# Patient Record
Sex: Male | Born: 1950 | Race: White | Hispanic: No | Smoking: Current every day smoker
Health system: Southern US, Community
[De-identification: ages and names within clinical notes are randomized; demographics above are authoritative.]

## PROBLEM LIST (undated history)

## (undated) DIAGNOSIS — C649 Malignant neoplasm of unspecified kidney, except renal pelvis: Secondary | ICD-10-CM

## (undated) DIAGNOSIS — J101 Influenza due to other identified influenza virus with other respiratory manifestations: Secondary | ICD-10-CM

## (undated) DIAGNOSIS — F29 Unspecified psychosis not due to a substance or known physiological condition: Secondary | ICD-10-CM

## (undated) DIAGNOSIS — E559 Vitamin D deficiency, unspecified: Secondary | ICD-10-CM

## (undated) DIAGNOSIS — K5730 Diverticulosis of large intestine without perforation or abscess without bleeding: Secondary | ICD-10-CM

## (undated) DIAGNOSIS — F419 Anxiety disorder, unspecified: Secondary | ICD-10-CM

## (undated) DIAGNOSIS — E669 Obesity, unspecified: Secondary | ICD-10-CM

## (undated) DIAGNOSIS — F32A Depression, unspecified: Secondary | ICD-10-CM

## (undated) DIAGNOSIS — F329 Major depressive disorder, single episode, unspecified: Secondary | ICD-10-CM

## (undated) DIAGNOSIS — I10 Essential (primary) hypertension: Secondary | ICD-10-CM

## (undated) DIAGNOSIS — E785 Hyperlipidemia, unspecified: Secondary | ICD-10-CM

## (undated) DIAGNOSIS — B1920 Unspecified viral hepatitis C without hepatic coma: Secondary | ICD-10-CM

## (undated) DIAGNOSIS — R918 Other nonspecific abnormal finding of lung field: Secondary | ICD-10-CM

## (undated) DIAGNOSIS — M109 Gout, unspecified: Secondary | ICD-10-CM

## (undated) DIAGNOSIS — J189 Pneumonia, unspecified organism: Secondary | ICD-10-CM

## (undated) HISTORY — DX: Hyperlipidemia, unspecified: E78.5

## (undated) HISTORY — DX: Vitamin D deficiency, unspecified: E55.9

## (undated) HISTORY — DX: Depression, unspecified: F32.A

## (undated) HISTORY — PX: MANDIBLE FRACTURE SURGERY: SHX706

## (undated) HISTORY — DX: Unspecified viral hepatitis C without hepatic coma: B19.20

## (undated) HISTORY — DX: Essential (primary) hypertension: I10

## (undated) HISTORY — DX: Anxiety disorder, unspecified: F41.9

## (undated) HISTORY — DX: Gout, unspecified: M10.9

## (undated) HISTORY — DX: Major depressive disorder, single episode, unspecified: F32.9

---

## 1998-06-03 DIAGNOSIS — F419 Anxiety disorder, unspecified: Secondary | ICD-10-CM

## 1998-06-03 DIAGNOSIS — F29 Unspecified psychosis not due to a substance or known physiological condition: Secondary | ICD-10-CM

## 1998-06-03 DIAGNOSIS — F32A Depression, unspecified: Secondary | ICD-10-CM

## 1998-06-03 HISTORY — DX: Unspecified psychosis not due to a substance or known physiological condition: F29

## 1998-06-03 HISTORY — DX: Anxiety disorder, unspecified: F41.9

## 1998-06-03 HISTORY — DX: Depression, unspecified: F32.A

## 1999-02-16 ENCOUNTER — Encounter (INDEPENDENT_AMBULATORY_CARE_PROVIDER_SITE_OTHER): Payer: Self-pay | Admitting: Specialist

## 1999-02-16 ENCOUNTER — Ambulatory Visit (HOSPITAL_COMMUNITY): Admission: RE | Admit: 1999-02-16 | Discharge: 1999-02-16 | Payer: Self-pay | Admitting: Gastroenterology

## 2004-07-01 ENCOUNTER — Emergency Department (HOSPITAL_COMMUNITY): Admission: EM | Admit: 2004-07-01 | Discharge: 2004-07-01 | Payer: Self-pay | Admitting: Emergency Medicine

## 2004-07-31 ENCOUNTER — Emergency Department (HOSPITAL_COMMUNITY): Admission: EM | Admit: 2004-07-31 | Discharge: 2004-07-31 | Payer: Self-pay | Admitting: Emergency Medicine

## 2004-11-13 ENCOUNTER — Encounter: Admission: RE | Admit: 2004-11-13 | Discharge: 2004-11-13 | Payer: Self-pay | Admitting: Occupational Medicine

## 2005-05-06 ENCOUNTER — Emergency Department (HOSPITAL_COMMUNITY): Admission: EM | Admit: 2005-05-06 | Discharge: 2005-05-06 | Payer: Self-pay | Admitting: Emergency Medicine

## 2006-03-25 ENCOUNTER — Inpatient Hospital Stay (HOSPITAL_COMMUNITY): Admission: EM | Admit: 2006-03-25 | Discharge: 2006-03-31 | Payer: Self-pay | Admitting: Emergency Medicine

## 2006-03-25 ENCOUNTER — Ambulatory Visit: Payer: Self-pay | Admitting: Pulmonary Disease

## 2006-05-29 ENCOUNTER — Ambulatory Visit (HOSPITAL_COMMUNITY): Admission: RE | Admit: 2006-05-29 | Discharge: 2006-05-29 | Payer: Self-pay | Admitting: Family Medicine

## 2006-06-01 ENCOUNTER — Inpatient Hospital Stay (HOSPITAL_COMMUNITY): Admission: EM | Admit: 2006-06-01 | Discharge: 2006-06-06 | Payer: Self-pay | Admitting: Emergency Medicine

## 2006-06-01 ENCOUNTER — Ambulatory Visit: Payer: Self-pay | Admitting: Internal Medicine

## 2006-06-01 ENCOUNTER — Emergency Department (HOSPITAL_COMMUNITY): Admission: EM | Admit: 2006-06-01 | Discharge: 2006-06-01 | Payer: Self-pay | Admitting: Emergency Medicine

## 2006-08-06 ENCOUNTER — Ambulatory Visit (HOSPITAL_COMMUNITY): Admission: RE | Admit: 2006-08-06 | Discharge: 2006-08-06 | Payer: Self-pay | Admitting: Internal Medicine

## 2007-04-02 ENCOUNTER — Emergency Department (HOSPITAL_COMMUNITY): Admission: EM | Admit: 2007-04-02 | Discharge: 2007-04-02 | Payer: Self-pay | Admitting: Emergency Medicine

## 2007-06-03 ENCOUNTER — Emergency Department (HOSPITAL_COMMUNITY): Admission: EM | Admit: 2007-06-03 | Discharge: 2007-06-03 | Payer: Self-pay | Admitting: Emergency Medicine

## 2008-05-03 DIAGNOSIS — R918 Other nonspecific abnormal finding of lung field: Secondary | ICD-10-CM

## 2008-05-03 HISTORY — DX: Other nonspecific abnormal finding of lung field: R91.8

## 2008-05-05 ENCOUNTER — Emergency Department (HOSPITAL_COMMUNITY): Admission: EM | Admit: 2008-05-05 | Discharge: 2008-05-05 | Payer: Self-pay | Admitting: Emergency Medicine

## 2009-01-30 ENCOUNTER — Ambulatory Visit: Payer: Self-pay | Admitting: Nurse Practitioner

## 2009-01-30 DIAGNOSIS — Z8701 Personal history of pneumonia (recurrent): Secondary | ICD-10-CM | POA: Insufficient documentation

## 2009-01-30 LAB — CONVERTED CEMR LAB
AST: 20 units/L (ref 0–37)
Alkaline Phosphatase: 57 units/L (ref 39–117)
BUN: 26 mg/dL — ABNORMAL HIGH (ref 6–23)
Basophils Absolute: 0 10*3/uL (ref 0.0–0.1)
Basophils Relative: 0 % (ref 0–1)
Creatinine, Ser: 1.25 mg/dL (ref 0.40–1.50)
Eosinophils Absolute: 0.1 10*3/uL (ref 0.0–0.7)
Eosinophils Relative: 1 % (ref 0–5)
Glucose, Bld: 80 mg/dL (ref 70–99)
HCT: 48.1 % (ref 39.0–52.0)
Hemoglobin: 16.2 g/dL (ref 13.0–17.0)
Lymphocytes Relative: 39 % (ref 12–46)
MCHC: 33.7 g/dL (ref 30.0–36.0)
MCV: 99.8 fL (ref 78.0–100.0)
Monocytes Absolute: 0.6 10*3/uL (ref 0.1–1.0)
PSA: 0.28 ng/mL (ref 0.10–4.00)
Platelets: 251 10*3/uL (ref 150–400)
RDW: 13.6 % (ref 11.5–15.5)
TSH: 1.639 microintl units/mL (ref 0.350–4.500)
Total Bilirubin: 0.3 mg/dL (ref 0.3–1.2)

## 2009-02-03 ENCOUNTER — Ambulatory Visit (HOSPITAL_COMMUNITY): Admission: RE | Admit: 2009-02-03 | Discharge: 2009-02-03 | Payer: Self-pay | Admitting: Internal Medicine

## 2009-02-07 ENCOUNTER — Ambulatory Visit: Payer: Self-pay | Admitting: Nurse Practitioner

## 2009-02-07 DIAGNOSIS — M109 Gout, unspecified: Secondary | ICD-10-CM | POA: Insufficient documentation

## 2009-02-07 DIAGNOSIS — B1920 Unspecified viral hepatitis C without hepatic coma: Secondary | ICD-10-CM | POA: Insufficient documentation

## 2009-02-15 ENCOUNTER — Encounter (INDEPENDENT_AMBULATORY_CARE_PROVIDER_SITE_OTHER): Payer: Self-pay | Admitting: Nurse Practitioner

## 2009-02-17 ENCOUNTER — Ambulatory Visit: Payer: Self-pay | Admitting: Internal Medicine

## 2009-02-24 DIAGNOSIS — J449 Chronic obstructive pulmonary disease, unspecified: Secondary | ICD-10-CM | POA: Insufficient documentation

## 2009-02-27 ENCOUNTER — Ambulatory Visit (HOSPITAL_COMMUNITY): Admission: RE | Admit: 2009-02-27 | Discharge: 2009-02-27 | Payer: Self-pay | Admitting: Internal Medicine

## 2009-02-27 ENCOUNTER — Encounter (INDEPENDENT_AMBULATORY_CARE_PROVIDER_SITE_OTHER): Payer: Self-pay | Admitting: Nurse Practitioner

## 2009-03-07 ENCOUNTER — Ambulatory Visit: Payer: Self-pay | Admitting: Nurse Practitioner

## 2009-03-07 LAB — CONVERTED CEMR LAB
Bilirubin Urine: NEGATIVE
Blood in Urine, dipstick: NEGATIVE
Ketones, urine, test strip: NEGATIVE
Nitrite: NEGATIVE
OCCULT 1: NEGATIVE
Protein, U semiquant: NEGATIVE
Urobilinogen, UA: 0.2

## 2009-03-08 ENCOUNTER — Encounter (INDEPENDENT_AMBULATORY_CARE_PROVIDER_SITE_OTHER): Payer: Self-pay | Admitting: Nurse Practitioner

## 2009-03-09 ENCOUNTER — Encounter (INDEPENDENT_AMBULATORY_CARE_PROVIDER_SITE_OTHER): Payer: Self-pay | Admitting: Nurse Practitioner

## 2009-03-09 LAB — CONVERTED CEMR LAB
Cholesterol: 226 mg/dL — ABNORMAL HIGH (ref 0–200)
HDL: 29 mg/dL — ABNORMAL LOW (ref >39)
Total CHOL/HDL Ratio: 7.8
Triglycerides: 419 mg/dL — ABNORMAL HIGH (ref <150)

## 2009-04-18 ENCOUNTER — Ambulatory Visit: Payer: Self-pay | Admitting: Nurse Practitioner

## 2009-05-19 ENCOUNTER — Encounter (INDEPENDENT_AMBULATORY_CARE_PROVIDER_SITE_OTHER): Payer: Self-pay | Admitting: Nurse Practitioner

## 2009-05-24 ENCOUNTER — Encounter (INDEPENDENT_AMBULATORY_CARE_PROVIDER_SITE_OTHER): Payer: Self-pay | Admitting: Nurse Practitioner

## 2009-06-03 DIAGNOSIS — K5730 Diverticulosis of large intestine without perforation or abscess without bleeding: Secondary | ICD-10-CM

## 2009-06-03 HISTORY — DX: Diverticulosis of large intestine without perforation or abscess without bleeding: K57.30

## 2009-10-04 ENCOUNTER — Emergency Department (HOSPITAL_COMMUNITY): Admission: EM | Admit: 2009-10-04 | Discharge: 2009-10-04 | Payer: Self-pay | Admitting: Emergency Medicine

## 2009-11-04 ENCOUNTER — Emergency Department (HOSPITAL_COMMUNITY): Admission: EM | Admit: 2009-11-04 | Discharge: 2009-11-04 | Payer: Self-pay | Admitting: Emergency Medicine

## 2009-11-19 ENCOUNTER — Emergency Department (HOSPITAL_COMMUNITY): Admission: EM | Admit: 2009-11-19 | Discharge: 2009-11-20 | Payer: Self-pay | Admitting: Emergency Medicine

## 2009-11-23 ENCOUNTER — Ambulatory Visit: Payer: Self-pay | Admitting: Nurse Practitioner

## 2009-11-23 LAB — CONVERTED CEMR LAB: Cholesterol, target level: 200 mg/dL

## 2009-11-24 ENCOUNTER — Encounter (INDEPENDENT_AMBULATORY_CARE_PROVIDER_SITE_OTHER): Payer: Self-pay | Admitting: Nurse Practitioner

## 2009-12-05 ENCOUNTER — Telehealth (INDEPENDENT_AMBULATORY_CARE_PROVIDER_SITE_OTHER): Payer: Self-pay | Admitting: Nurse Practitioner

## 2009-12-15 ENCOUNTER — Telehealth (INDEPENDENT_AMBULATORY_CARE_PROVIDER_SITE_OTHER): Payer: Self-pay | Admitting: Nurse Practitioner

## 2009-12-19 ENCOUNTER — Telehealth (INDEPENDENT_AMBULATORY_CARE_PROVIDER_SITE_OTHER): Payer: Self-pay | Admitting: Nurse Practitioner

## 2010-01-02 ENCOUNTER — Ambulatory Visit: Payer: Self-pay | Admitting: Physician Assistant

## 2010-01-24 ENCOUNTER — Emergency Department (HOSPITAL_COMMUNITY): Admission: EM | Admit: 2010-01-24 | Discharge: 2010-01-24 | Payer: Self-pay | Admitting: Emergency Medicine

## 2010-03-15 ENCOUNTER — Emergency Department (HOSPITAL_COMMUNITY): Admission: EM | Admit: 2010-03-15 | Discharge: 2010-03-15 | Payer: Self-pay | Admitting: Emergency Medicine

## 2010-05-03 ENCOUNTER — Emergency Department (HOSPITAL_COMMUNITY)
Admission: EM | Admit: 2010-05-03 | Discharge: 2010-05-03 | Payer: Self-pay | Source: Home / Self Care | Admitting: Emergency Medicine

## 2010-05-10 ENCOUNTER — Emergency Department (HOSPITAL_COMMUNITY): Admission: EM | Admit: 2010-05-10 | Discharge: 2009-10-09 | Payer: Self-pay | Admitting: Emergency Medicine

## 2010-05-15 ENCOUNTER — Ambulatory Visit (HOSPITAL_COMMUNITY): Payer: Self-pay | Admitting: Licensed Clinical Social Worker

## 2010-06-05 ENCOUNTER — Ambulatory Visit (HOSPITAL_COMMUNITY)
Admission: RE | Admit: 2010-06-05 | Discharge: 2010-06-05 | Payer: Self-pay | Source: Home / Self Care | Attending: Licensed Clinical Social Worker | Admitting: Licensed Clinical Social Worker

## 2010-06-09 ENCOUNTER — Emergency Department (HOSPITAL_COMMUNITY)
Admission: EM | Admit: 2010-06-09 | Discharge: 2010-06-10 | Payer: Self-pay | Source: Home / Self Care | Admitting: Emergency Medicine

## 2010-06-22 ENCOUNTER — Ambulatory Visit (HOSPITAL_COMMUNITY)
Admission: RE | Admit: 2010-06-22 | Discharge: 2010-06-22 | Payer: Self-pay | Source: Home / Self Care | Attending: Licensed Clinical Social Worker | Admitting: Licensed Clinical Social Worker

## 2010-07-03 NOTE — Letter (Signed)
Summary: [facility]  [facility]   Imported By: Arta Bruce 02/15/2009 11:24:41  _____________________________________________________________________  External Attachment:    Type:   Image     Comment:   External Document

## 2010-07-03 NOTE — Letter (Signed)
Summary: derm clinic biopsy results  derm clinic biopsy results   Imported By: Arta Bruce 07/19/2009 14:17:00  _____________________________________________________________________  External Attachment:    Type:   Image     Comment:   External Document

## 2010-07-03 NOTE — Letter (Signed)
Summary: REPORT OF DERMATOPATHOLOGY  REPORT OF DERMATOPATHOLOGY   Imported By: Arta Bruce 07/06/2009 10:37:41  _____________________________________________________________________  External Attachment:    Type:   Image     Comment:   External Document

## 2010-07-03 NOTE — Progress Notes (Signed)
Summary: medication refills  Phone Note Call from Patient Call back at Home Phone [phone]   Caller: Patient Call For: Lehman Prom FNP Reason for Call: Refill Medication Summary of Call: Patient wants a Percocet refill, secondary to tooth abcess. Also, would like a penicillin rx continued from original ED RX. Patient has appt with dental clinic on 7/13 Initial call taken by: Janace Aris,  December 05, 2009 11:29 AM  Follow-up for Phone Call        forward to N. Daphine Deutscher, FNP Follow-up by: [name],  December 05, 2009 4:49 PM  Additional Follow-up for Phone Call Additional follow up Details #1::        Advise pt to keep his appt at the dental clinic on 12/13/2009. last refill on both narcotic and antibiotic from this office.  only filling until he gets into the dental clinic. Rx in basket - pt will need to come pick up  Additional Follow-up by: Lehman Prom FNP,  December 06, 2009 8:36 AM    Additional Follow-up for Phone Call Additional follow up Details #2::    patient came and picked up script and he is aware of the message also. Follow-up by: Leodis Rains,  December 06, 2009 12:33 PM  New/Updated Medications: PENICILLIN V POTASSIUM 500 MG TABS (PENICILLIN V POTASSIUM) ONe tablet by mouth three times a day for infection Prescriptions: PENICILLIN V POTASSIUM 500 MG TABS (PENICILLIN V POTASSIUM) ONe tablet by mouth three times a day for infection  #30 x 0   Entered and Authorized by:   Lehman Prom FNP   Signed by:   Lehman Prom FNP on 12/06/2009   Method used:   Print then Give to Patient   RxID:   [id number] PERCOCET 5-325 MG TABS (OXYCODONE-ACETAMINOPHEN) One tablet by mouth two times a day as needed for pain  #20 x 0   Entered and Authorized by:   Lehman Prom FNP   Signed by:   Lehman Prom FNP on 12/06/2009   Method used:   Print then Give to Patient   RxID:   [id number]

## 2010-07-03 NOTE — Progress Notes (Signed)
Summary: Tooth Pain  Phone Note Call from Patient Call back at [phone]   Summary of Call: The pt has a terrible tooth pain, and he is on the waiting list to be seen at the  Dental Clinic.  Now, the Dental Clinic doesn't have a dentist in site and they are searching for a new dentist, so he wanted to know if the provider can prescribe him a pain medication for alleviating the pain.  Please call him back the number above.. Dyann [facility] [place]) Daphine Deutscher FnP Initial call taken by: Manon Hilding,  December 15, 2009 10:03 AM  Follow-up for Phone Call        Sent to [facility].  Dutch Quint RN  December 15, 2009 10:27 AM   Additional Follow-up for Phone Call Additional follow up Details #1::        Will give pt. enough for next 5 days until [name] can review and decide if pt. can continue to receive pain meds regarding this. Creatinine borderline, so avoiding NSAIDs Additional Follow-up by: Julieanne Manson MD,  December 15, 2009 2:00 PM    Additional Follow-up for Phone Call Additional follow up Details #2::    [name]  December 15, 2009 3:44 PM Left message on machine for pt to return call to the office  pt informed. Rx at front desk to be picked up. [name]  December 15, 2009 3:55 PM   Additional Follow-up for Phone Call Additional follow up Details #3:: Details for Additional Follow-up Action Taken: PATIENT CAME BY AND PICKED UP. Additional Follow-up by: Leodis Rains,  December 15, 2009 4:22 PM  Prescriptions: PERCOCET 5-325 MG TABS (OXYCODONE-ACETAMINOPHEN) One tablet by mouth two times a day as needed for pain  #10 x 0   Entered and Authorized by:   Julieanne Manson MD   Signed by:   Julieanne Manson MD on 12/15/2009   Method used:   Print then Give to Patient   RxID:   [id number] PERCOCET 5-325 MG TABS (OXYCODONE-ACETAMINOPHEN) One tablet by mouth two times a day as needed for pain  #10 x 0   Entered by:   Julieanne Manson MD   Authorized by:   Lehman Prom FNP   Signed by:   Julieanne Manson MD on 12/15/2009   Method used:   Print then Give to Patient   RxID:   [id number]  Wrong name on initial refill--reprinted

## 2010-07-03 NOTE — Progress Notes (Signed)
Summary: Requesting for refills from Penicellin and Pecocet  Phone Note Call from Patient Call back at [phone]   Summary of Call: WENT TO DENIST/DIDN'T HAVE A DENIST THERE SO HE DID NOT GET TEETH PULLED/HAD ENOUGH MED FOR WEEKEND//DR.MULBERRY GAVE HIM EOUGH TO GET THRU THE WEEKEND,BUT NOW HE IS OUT AND IN ALOT OF PAIN//NEEDS PENICILLIN AND PERCOCET//CAN'T SLEEP//[facility] [place]//[phone] Initial call taken by: Arta Bruce,  December 19, 2009 11:05 AM  Follow-up for Phone Call        The pt called [place] back again because he is under a lot of pain and he doesn't has any more refills from penicillin and percocet.  Pt goes to [facility] located in [place]..  Please call him back asap. Manon Hilding  December 19, 2009 2:32 PM  Additional Follow-up for Phone Call Additional follow up Details #1::        Sent to N. Daphine Deutscher.  Dutch Quint RN  December 19, 2009 3:34 PM     Additional Follow-up for Phone Call Additional follow up Details #2::    no further refills on narcotics. Initially presented 1 month ago with dental and tooth pain i have given pt 2 refills on percocet when orginally he was only to get one and he reported he was going to the dentist.  Did he rescheduled his appt? Why would he get an appt and no dentist be available. I now see that Dr. Delrae Alfred has also refilled his pain meds so NO further refill Will given ibuprofen 800mg  for pt to take by mouth three times a day (Rx in basket) he may alternate this will tylenol if he needs to He was just given a 10 day supply of penicillin on 12/06/2009 which means he should have completed the supply on 12/16/2009 - no need for futher antibiotics he needs to get the tooth pulled. n.martin,fnp  December 19, 2009  3:43 PM   Additional Follow-up for Phone Call Additional follow up Details #3:: Details for Additional Follow-up Action Taken: States he has three teeth to be pulled, with one abscessed.  Advised of provider's response and  recommendations and new Rx -- faxed to [facility].  Advised to make sure he eats when taking ibuprofen.   Additional Follow-up by: Dutch Quint RN,  December 19, 2009 5:06 PM  New/Updated Medications: IBUPROFEN 800 MG TABS (IBUPROFEN) One tablet by mouth three times a day as needed for pain Prescriptions: IBUPROFEN 800 MG TABS (IBUPROFEN) One tablet by mouth three times a day as needed for pain  #30 x 0   Entered and Authorized by:   Lehman Prom FNP   Signed by:   Lehman Prom FNP on 12/19/2009   Method used:   Print then Give to Patient   RxID:   [id number]

## 2010-07-03 NOTE — Assessment & Plan Note (Signed)
Summary: Dental Abscess   Vital Signs:  Patient profile:   60 year old male Weight:      275 pounds Temp:     98.2 degrees F Pulse rhythm:   regular Resp:     18 per minute BP sitting:   126 / 90  (left arm) Cuff size:   large  Vitals Entered By: Vesta Mixer CMA (November 23, 2009 2:23 PM) CC: bad dental pain.  Went to ED Sunday night started on pcn and gave him some vicodin, but he has used that all up.  He called the dental clinic and was told to come here so we could refer him back to them., Lipid Management Is Patient Diabetic? No Pain Assessment Patient in pain? yes     Location: teeth Intensity: 10  Does patient need assistance? Ambulation Normal   CC:  bad dental pain.  Went to ED Sunday night started on pcn and gave him some vicodin, but he has used that all up.  He called the dental clinic and was told to come here so we could refer him back to them., and Lipid Management.  History of Present Illness:  Pt the office with complaints of dental pain. Pt went to the ER on 11/19/2009 for dental abscess. pt was started on Pen V K 500mg  three times a day x 10 days (started on 11/23/2009) Pt was also given pain medications for which he has completed his supply Some swelling in his left jaw Last dental appt was 2 years ago  Obesity - 20 pound weight loss since last visit in this office Pt's wife is diabetic and he has modified his diet to accomodate her needs. Pt feels much better with weight loss.  Lipid Management History:      Positive NCEP/ATP III risk factors include male age 59 years old or older, HDL cholesterol less than 40, current tobacco user, and hypertension.  Negative NCEP/ATP III risk factors include non-diabetic, no ASHD (atherosclerotic heart disease), no prior stroke/TIA, no peripheral vascular disease, and no history of aortic aneurysm.        The patient states that he does not know about the "Therapeutic Lifestyle Change" diet.  The patient does not know  about adjunctive measures for cholesterol lowering.  He expresses no side effects from his lipid-lowering medication.  Comments include: pt is not taking any current meds - pt lost weight and stopped taking meds.  The patient denies any symptoms to suggest myopathy or liver disease.     Allergies (verified): No Known Drug Allergies  Review of Systems General:  +dental pain. CV:  Denies chest pain or discomfort. Resp:  Denies cough. GI:  Denies nausea and vomiting.  Physical Exam  General:  alert.   Head:  occipital -  Eyes:  glasses Mouth:  left upper molar - poor dentation, cavity gingiva tender jaw swollen   Impression & Recommendations:  Problem # 1:  ABSCESS, TOOTH (ICD-522.5) pt to continue to take the Pen V K as ordered by the ER will order percocet as needed for pain (for short term pain relief only) Orders: Dental Referral (Dentist)  Problem # 2:  HYPERCHOLESTEROLEMIA (ICD-272.0) pt is no longer taking medications will recheck fasting labs in 4 weeks His updated medication list for this problem includes:    Gemfibrozil 600 Mg Tabs (Gemfibrozil) ..... One tablet by mouth 30 minutes before breakfast and dinner  Problem # 3:  SKIN LESION (ICD-709.9) will refer to dental clinic ? wart  lesion in occipital region Orders: Dermatology Referral (Derma)  Complete Medication List: 1)  Allopurinol 300 Mg Tabs (Allopurinol) .... One tablet by mouth daily 2)  Citalopram Hydrobromide 40 Mg Tabs (Citalopram hydrobromide) .... One tablet by mouth daily for mood 3)  Diazepam 10 Mg Tabs (Diazepam) .... 1/2 tablet by mouth three times a day as needed for nerves 4)  Atenolol 100 Mg Tabs (Atenolol) .... One tablet by mouth nightly 5)  Bayer Low Strength 81 Mg Tbec (Aspirin) .... One tablet by mouth daily 6)  Fluticasone Propionate 50 Mcg/act Susp (Fluticasone propionate) .... 2 sprays each nostril daily 7)  Proventil Hfa 108 (90 Base) Mcg/act Aers (Albuterol sulfate) .... 2 puffs  every 4 hours as needed for dyspnea 8)  Triamcinolone Acetonide 0.1 % Oint (Triamcinolone acetonide) .... One application topically two times a day to affected area 9)  Gemfibrozil 600 Mg Tabs (Gemfibrozil) .... One tablet by mouth 30 minutes before breakfast and dinner 10)  Lisinopril 20 Mg Tabs (Lisinopril) .... One tablet by mouth daily for blood pressure 11)  Percocet 5-325 Mg Tabs (Oxycodone-acetaminophen) .... One tablet by mouth two times a day as needed for pain  Lipid Assessment/Plan:      Based on NCEP/ATP III, the patient's risk factor category is "2 or more risk factors and a calculated 10 year CAD risk of < 20%".  The patient's lipid goals are as follows: Total cholesterol goal is 200; LDL cholesterol goal is 130; HDL cholesterol goal is 40; Triglyceride goal is 150.    Patient Instructions: 1)  You will be referred to the dental clinic. 2)  They will notify you with time/date of the appointment. 3)  Take all the antibiotics as ordered 4)  Will give pain meds (for acute pain only) 5)  You will be referred to dermtology  6)  Follow up in 1 month for fasting labs - lipids, cmp, cmp,psa Prescriptions: PERCOCET 5-325 MG TABS (OXYCODONE-ACETAMINOPHEN) One tablet by mouth two times a day as needed for pain  #20 x 0   Entered and Authorized by:   Lehman Prom FNP   Signed by:   Lehman Prom FNP on 11/23/2009   Method used:   Print then Give to Patient   RxID:   [id number]

## 2010-07-03 NOTE — Letter (Signed)
Summary: [facility]   Imported By: Arta Bruce 06/20/2009 15:23:02  _____________________________________________________________________  External Attachment:    Type:   Image     Comment:   External Document

## 2010-07-04 ENCOUNTER — Encounter (INDEPENDENT_AMBULATORY_CARE_PROVIDER_SITE_OTHER): Payer: Self-pay | Admitting: Nurse Practitioner

## 2010-07-06 ENCOUNTER — Encounter (HOSPITAL_COMMUNITY): Payer: Self-pay | Admitting: Licensed Clinical Social Worker

## 2010-07-06 ENCOUNTER — Ambulatory Visit (HOSPITAL_COMMUNITY): Admit: 2010-07-06 | Payer: Self-pay | Admitting: Licensed Clinical Social Worker

## 2010-07-06 DIAGNOSIS — F332 Major depressive disorder, recurrent severe without psychotic features: Secondary | ICD-10-CM

## 2010-07-06 NOTE — Letter (Signed)
Summary: REFERRAL//DERMATOLOGY//APPT DATE & TIME  REFERRAL//DERMATOLOGY//APPT DATE & TIME   Imported By: Arta Bruce 11/24/2009 09:34:33  _____________________________________________________________________  External Attachment:    Type:   Image     Comment:   External Document

## 2010-07-06 NOTE — Letter (Signed)
Summary: DENTAL REFERRAL  DENTAL REFERRAL   Imported By: Arta Bruce 11/24/2009 09:14:41  _____________________________________________________________________  External Attachment:    Type:   Image     Comment:   External Document

## 2010-07-17 ENCOUNTER — Encounter (INDEPENDENT_AMBULATORY_CARE_PROVIDER_SITE_OTHER): Payer: Self-pay | Admitting: Nurse Practitioner

## 2010-07-17 ENCOUNTER — Encounter: Payer: Self-pay | Admitting: Nurse Practitioner

## 2010-07-17 DIAGNOSIS — F17200 Nicotine dependence, unspecified, uncomplicated: Secondary | ICD-10-CM | POA: Insufficient documentation

## 2010-07-17 DIAGNOSIS — F29 Unspecified psychosis not due to a substance or known physiological condition: Secondary | ICD-10-CM | POA: Insufficient documentation

## 2010-07-18 ENCOUNTER — Encounter (HOSPITAL_COMMUNITY): Payer: Self-pay | Admitting: Licensed Clinical Social Worker

## 2010-07-18 DIAGNOSIS — F332 Major depressive disorder, recurrent severe without psychotic features: Secondary | ICD-10-CM

## 2010-07-25 NOTE — Assessment & Plan Note (Signed)
Summary: HTN   Vital Signs:  Patient profile:   60 year old male Weight:      273.5 pounds BMI:     35.72 Temp:     98.5 degrees F oral Pulse rate:   72 / minute Pulse rhythm:   regular Resp:     16 per minute BP sitting:   150 / 110  (left arm) Cuff size:   large  Vitals Entered By: [name] (July 17, 2010 12:16 PM)  Nutrition Counseling: Patient's BMI is greater than 25 and therefore counseled on weight management options. CC: CPE, Hypertension Management, Lipid Management Is Patient Diabetic? No Pain Assessment Patient in pain? yes     Location: body Intensity: 5-6 Onset of pain  Chronic  Does patient need assistance? Functional Status Self care Ambulation Normal  Vision Screening:Left eye w/o correction: 20 / 25 Right Eye w/o correction: 20 / 20-1 Both eyes w/o correction:  20/ 20-2        Vision Entered By: [name] (July 17, 2010 1:03 PM)   CC:  CPE, Hypertension Management, and Lipid Management.  History of Present Illness:  Pt into the office for f/u for chronic issues  Psychosis - S/p hospitalization in 05/2010 - "Psychotic break - I was in [place]" Pt has been going to psych and he is coming to grip with what has happened  Orthopedic specialist - Pt had a recent visit due to swelling and pain in joints. He was referred to Rheumatologist - he does have to have $500 up front to be seen for that appt.  Hypertension History:      Positive major cardiovascular risk factors include male age 78 years old or older, hyperlipidemia, hypertension, and current tobacco user.  Negative major cardiovascular risk factors include no history of diabetes.        Further assessment for target organ damage reveals no history of ASHD, cardiac end-organ damage (CHF/LVH), stroke/TIA, peripheral vascular disease, renal insufficiency, or hypertensive retinopathy.    Lipid Management History:      Positive NCEP/ATP III risk factors include male age 13  years old or older, HDL cholesterol less than 40, current tobacco user, and hypertension.  Negative NCEP/ATP III risk factors include non-diabetic, no ASHD (atherosclerotic heart disease), no prior stroke/TIA, no peripheral vascular disease, and no history of aortic aneurysm.        The patient states that he does not know about the "Therapeutic Lifestyle Change" diet.  Comments include: previously on meds but not renewed in over 1 year.      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 Resp:  Denies cough. GI:  Denies abdominal pain, nausea, and vomiting. MS:  Complains of joint pain. Psych:  Complains of depression; pt has ongoing f/u with Psych.  Physical Exam  General:  alert.  Head:  normocephalic.   Eyes:  glasses Lungs:  normal breath sounds.   Heart:  normal rate and regular rhythm.   Abdomen:  normal bowel sounds.   Msk:  up to the exam table Neurologic:  alert & oriented X3.   Skin:  color normal.   Psych:  Oriented X3.     Impression & Recommendations:  Problem # 1:  HYPERTENSION, BENIGN (ICD-401.1) BP elevated  Pt reports that he is taking medications but refills have not been called in in several months. His updated medication list for this problem includes:    Atenolol 100 Mg Tabs (Atenolol) ..... One tablet by mouth nightly    Lisinopril 20 Mg Tabs (Lisinopril) ..... One tablet by mouth daily for blood  pressure  Problem # 2:  PSYCHOSIS (ICD-298.9) ongoing f/u with psych  Problem # 3:  TOBACCO ABUSE (ICD-305.1) pt would like to quit - he has plans to gradually decrease  Problem # 4:  HYPERURICEMIA (ICD-790.6) will check on next visit  Complete Medication List: 1)  Allopurinol 300 Mg Tabs (Allopurinol) .... One tablet by mouth daily 2)  Citalopram Hydrobromide 40 Mg Tabs (Citalopram hydrobromide) .... One tablet by mouth daily for mood 3)  Atenolol 100 Mg Tabs (Atenolol) .... One tablet by mouth nightly 4)  Bayer Low Strength 81 Mg Tbec (Aspirin) .... One tablet by mouth daily 5)  Triamcinolone Acetonide 0.1 % Oint (Triamcinolone acetonide) .... One application topically two times a day to affected area 6)  Gemfibrozil 600 Mg Tabs (Gemfibrozil) .... One tablet by mouth 30 minutes before breakfast and dinner 7)  Lisinopril 20 Mg Tabs (Lisinopril) .... One tablet by mouth daily for blood pressure 8)  Ibuprofen 800 Mg Tabs (Ibuprofen) .... One tablet by mouth three times a day as needed for pain 9)  Xanax 0.5 Mg Tabs (Alprazolam) .... Rx by psych  Hypertension Assessment/Plan:      The patient's hypertensive risk group is category B: At least one risk factor (excluding diabetes) with no target organ damage.  Today's blood pressure is 150/110.  His blood pressure goal is < 140/90.  Lipid Assessment/Plan:      Based on NCEP/ATP III, the patient's risk factor category is "2 or more risk factors and a calculated 10 year CAD risk of > 20%".  The patient's lipid goals are as follows: Total cholesterol goal is 200; LDL cholesterol goal is 130; HDL cholesterol goal is 40; Triglyceride goal is 150.    Patient Instructions: 1)  Schedule fasting labs for next week 2)  No food after midnight before this visit. 3)  You can drink water. 4)  You will need cbc, cmet, lipids, hgba1c, vitamin d, magnesium, tsh, uric acid. 5)  Blood pressure - high today.  150/110.  Take your medications before the next  visit and have your blood pressure rechecked.    Orders Added: 1)  Est. Patient Level III [04540]

## 2010-07-25 NOTE — Progress Notes (Signed)
Summary: Office Visit//DEPRESSION SCREENING  Office Visit//DEPRESSION SCREENING   Imported By: Arta Bruce 07/18/2010 11:59:01  _____________________________________________________________________  External Attachment:    Type:   Image     Comment:   External Document

## 2010-08-01 ENCOUNTER — Ambulatory Visit (HOSPITAL_COMMUNITY): Payer: Self-pay | Admitting: Physician Assistant

## 2010-08-01 DIAGNOSIS — F332 Major depressive disorder, recurrent severe without psychotic features: Secondary | ICD-10-CM

## 2010-08-14 LAB — DIFFERENTIAL
Basophils Relative: 0 % (ref 0–1)
Eosinophils Absolute: 0 10*3/uL (ref 0.0–0.7)
Lymphs Abs: 1.4 10*3/uL (ref 0.7–4.0)
Neutrophils Relative %: 83 % — ABNORMAL HIGH (ref 43–77)

## 2010-08-14 LAB — CBC
Hemoglobin: 17.5 g/dL — ABNORMAL HIGH (ref 13.0–17.0)
MCV: 94.3 fL (ref 78.0–100.0)
Platelets: 272 10*3/uL (ref 150–400)
RBC: 5.26 MIL/uL (ref 4.22–5.81)
WBC: 12.3 10*3/uL — ABNORMAL HIGH (ref 4.0–10.5)

## 2010-08-14 LAB — RAPID URINE DRUG SCREEN, HOSP PERFORMED
Amphetamines: NOT DETECTED
Barbiturates: NOT DETECTED
Opiates: POSITIVE — AB

## 2010-08-14 LAB — BASIC METABOLIC PANEL
Chloride: 95 mEq/L — ABNORMAL LOW (ref 96–112)
Creatinine, Ser: 1.08 mg/dL (ref 0.4–1.5)
GFR calc Af Amer: >60 mL/min (ref >60)
Potassium: 5 mEq/L (ref 3.5–5.1)
Sodium: 139 mEq/L (ref 135–145)

## 2010-08-14 LAB — POCT CARDIAC MARKERS
CKMB, poc: <1 ng/mL — ABNORMAL LOW (ref 1.0–8.0)
Myoglobin, poc: 106 ng/mL (ref 12–200)
Myoglobin, poc: 63.7 ng/mL (ref 12–200)

## 2010-08-14 LAB — ETHANOL: Alcohol, Ethyl (B): <5 mg/dL (ref 0–10)

## 2010-08-15 ENCOUNTER — Encounter (HOSPITAL_COMMUNITY): Payer: Self-pay | Admitting: Licensed Clinical Social Worker

## 2010-08-15 DIAGNOSIS — F3289 Other specified depressive episodes: Secondary | ICD-10-CM

## 2010-08-15 DIAGNOSIS — F329 Major depressive disorder, single episode, unspecified: Secondary | ICD-10-CM

## 2010-08-16 LAB — CBC
MCH: 33.3 pg (ref 26.0–34.0)
MCV: 95.5 fL (ref 78.0–100.0)
Platelets: 210 10*3/uL (ref 150–400)
RDW: 13.1 % (ref 11.5–15.5)

## 2010-08-16 LAB — DIFFERENTIAL
Basophils Absolute: 0 10*3/uL (ref 0.0–0.1)
Lymphocytes Relative: 14 % (ref 12–46)
Monocytes Absolute: 0.5 10*3/uL (ref 0.1–1.0)
Monocytes Relative: 6 % (ref 3–12)
Neutro Abs: 6.2 10*3/uL (ref 1.7–7.7)

## 2010-08-16 LAB — COMPREHENSIVE METABOLIC PANEL
Albumin: 4.4 g/dL (ref 3.5–5.2)
BUN: 10 mg/dL (ref 6–23)
Creatinine, Ser: 1.11 mg/dL (ref 0.4–1.5)
Total Protein: 7.8 g/dL (ref 6.0–8.3)

## 2010-08-16 LAB — POCT CARDIAC MARKERS
CKMB, poc: <1 ng/mL — ABNORMAL LOW (ref 1.0–8.0)
Myoglobin, poc: 81.8 ng/mL (ref 12–200)

## 2010-08-21 LAB — DIFFERENTIAL
Eosinophils Absolute: 0.1 10*3/uL (ref 0.0–0.7)
Eosinophils Relative: 1 % (ref 0–5)
Lymphs Abs: 2.4 10*3/uL (ref 0.7–4.0)
Monocytes Absolute: 0.7 10*3/uL (ref 0.1–1.0)
Monocytes Relative: 7 % (ref 3–12)

## 2010-08-21 LAB — URINALYSIS, ROUTINE W REFLEX MICROSCOPIC
Bilirubin Urine: NEGATIVE
Glucose, UA: NEGATIVE mg/dL
Hgb urine dipstick: NEGATIVE
Ketones, ur: NEGATIVE mg/dL
Specific Gravity, Urine: 1.024 (ref 1.005–1.030)
pH: 5.5 (ref 5.0–8.0)

## 2010-08-21 LAB — COMPREHENSIVE METABOLIC PANEL
ALT: 16 U/L (ref 0–53)
AST: 20 U/L (ref 0–37)
Albumin: 3.8 g/dL (ref 3.5–5.2)
CO2: 30 mEq/L (ref 19–32)
Calcium: 8.9 mg/dL (ref 8.4–10.5)
GFR calc Af Amer: >60 mL/min (ref >60)
GFR calc non Af Amer: >60 mL/min (ref >60)
Sodium: 138 mEq/L (ref 135–145)
Total Protein: 6.2 g/dL (ref 6.0–8.3)

## 2010-08-21 LAB — CBC
MCHC: 33.8 g/dL (ref 30.0–36.0)
Platelets: 197 10*3/uL (ref 150–400)
RBC: 4.35 MIL/uL (ref 4.22–5.81)
WBC: 9.3 10*3/uL (ref 4.0–10.5)

## 2010-08-29 ENCOUNTER — Encounter (HOSPITAL_COMMUNITY): Payer: Self-pay | Admitting: Physician Assistant

## 2010-08-29 DIAGNOSIS — F332 Major depressive disorder, recurrent severe without psychotic features: Secondary | ICD-10-CM

## 2010-08-30 ENCOUNTER — Encounter (HOSPITAL_BASED_OUTPATIENT_CLINIC_OR_DEPARTMENT_OTHER): Payer: Self-pay | Admitting: Licensed Clinical Social Worker

## 2010-08-30 DIAGNOSIS — F332 Major depressive disorder, recurrent severe without psychotic features: Secondary | ICD-10-CM

## 2010-09-15 ENCOUNTER — Emergency Department (HOSPITAL_COMMUNITY)
Admission: EM | Admit: 2010-09-15 | Discharge: 2010-09-15 | Disposition: A | Discharge status: Home / Self Care | Payer: Self-pay | Attending: Emergency Medicine | Admitting: Emergency Medicine

## 2010-09-15 DIAGNOSIS — I10 Essential (primary) hypertension: Secondary | ICD-10-CM | POA: Insufficient documentation

## 2010-09-15 DIAGNOSIS — M109 Gout, unspecified: Secondary | ICD-10-CM | POA: Insufficient documentation

## 2010-09-15 DIAGNOSIS — M2550 Pain in unspecified joint: Secondary | ICD-10-CM | POA: Insufficient documentation

## 2010-10-03 ENCOUNTER — Encounter (HOSPITAL_COMMUNITY): Payer: Self-pay | Admitting: Physician Assistant

## 2010-10-03 ENCOUNTER — Encounter (HOSPITAL_COMMUNITY): Payer: Self-pay | Admitting: Licensed Clinical Social Worker

## 2010-10-03 DIAGNOSIS — F332 Major depressive disorder, recurrent severe without psychotic features: Secondary | ICD-10-CM

## 2010-10-18 ENCOUNTER — Encounter (HOSPITAL_BASED_OUTPATIENT_CLINIC_OR_DEPARTMENT_OTHER): Payer: Self-pay | Admitting: Licensed Clinical Social Worker

## 2010-10-18 DIAGNOSIS — F332 Major depressive disorder, recurrent severe without psychotic features: Secondary | ICD-10-CM

## 2010-10-19 NOTE — Discharge Summary (Signed)
Jonathan Wilson, Jonathan Wilson                 ACCOUNT NO.:  [id number]   MEDICAL RECORD NO.:  [id number]          PATIENT TYPE:  INP   LOCATION:  1401                         FACILITY:  [facility]   PHYSICIAN:  Madaline Savage, MD        DATE OF BIRTH:  [birth year]   DATE OF ADMISSION:  03/25/2006  DATE OF DISCHARGE:  03/31/2006                                 DISCHARGE SUMMARY   PRIMARY CARE PHYSICIAN:  Dr. Merla Riches at Urgent Care.   CONSULTATIONS IN HOSPITAL:  Dr. Shelle Iron saw the patient from Pulmonary and  Critical Care.   DISCHARGE MEDICATIONS:  1. Enalapril 10 mg once daily.  2. Paxil 60 mg once daily.  3. Dilaudid 2 mg every 6 hours as needed for headache.   SHORT HOSPITAL COURSE:  Jonathan Wilson is a 60 year old gentleman with a history  of chronic smoking, who came in with the complaints of fever, shortness of  breath, and diarrhea for three days.  On admission, his blood pressure was  running in the 80s, and he had a fever of 99.  Initial x-ray chest showed a  large consolidative air space opacity.  A CT scan was ordered, and he was  admitted to the ICU for treatment of his pneumonia.   PROCEDURES PERFORMED IN HOSPITAL:  1. He had a CT of the chest done on the 23rd of October, 2007, which      showed a large mass-like density involving much of the right lobe.      Given there are air bronchograms within this density, there is air      space disease within the right upper lobe, which is favorable for      pneumonia.  It is difficult to completely exclude a tumor, and it was      recommended to get a followup CT scan.  2. He had further x-ray chest done on the 26th of October, 2007, which      showed a possible slight decrease of the size of the right lower lobe      opacity.   PROBLEM LIST:  1. Pneumococcal pneumonia:  This gentleman came in with a right lower lobe      opacity, and he had fever and hypotension.  He was put on pressors in      the ICU.  We consulted  Pulmonary, Dr. Shelle Iron, and  Pneumococcal antigen      came back as positive.  He was initially treated with Rocephin and      Azithromax and later Vancomycin was added to it, and once his condition      started to improve we continued him on Rocephin for a total of eight      days.  Dr. Shelle Iron thinks that the right lower lobe mass is possibly      fully pneumonia, but we have to do followup studies to see if it      resolves completely.  The plan is to get an x-ray of the chest in two      months time to see this  pneumonia completely resolves.  If it does not      completely resolve, we have to get a CT scan of the chest.  At this      point of time, his blood pressure has been stable, he has been off      pressors, he has been afebrile for the last four days, and his      breathing is back to his baseline.  He will be discharged home in a      stable condition.  2. Possible right lower lobe mass:  The plan is to get an x-ray as an      outpatient in two months to see if it completely resolves.  If it does      not completely resolve, he needs a followup CT scan.  3. Hypertension:  When he came in, he was hypotensive so his blood      pressure pills were held, but now his blood pressure is back up and he      is restarted on his home dose of Enalapril and will continue on the      same at home.  4. Acute renal insufficiency:  While in the hospital with the hypotension,      he went into acute renal failure with his creatinine going up to 2.4,      but his creatinine is now back to his baseline.   DISPOSITION:  He now will be discharged home in a stable condition.   FOLLOWUP:  He will follow up with his primary care doctor, Dr. Merla Riches, in  one week.  He is also to get his x-ray of his chest done in two months time  at the last week of December, and if it is positive or if it does not show  complete resolution of the pneumonia he will need a CAT scan of the chest.      Madaline Savage, MD  Electronically  Signed     PKN/MEDQ  D:  03/31/2006  T:  03/31/2006  Job:  [id number]   cc:   Dr. Merla Riches ??

## 2010-10-19 NOTE — Discharge Summary (Signed)
NAMESENG, LARCH                 ACCOUNT NO.:  [id number]   MEDICAL RECORD NO.:  [id number]          PATIENT TYPE:  INP   LOCATION:  1519                         FACILITY:  [facility]   PHYSICIAN:  Lonia Blood, M.D.      DATE OF BIRTH:  Jul 23, 1950   DATE OF ADMISSION:  06/01/2006  DATE OF DISCHARGE:                               DISCHARGE SUMMARY   PRIMARY CARE PHYSICIAN:  Lucky Cowboy, M.D.   DISCHARGE DIAGNOSES:  1. Recurrent pneumonia most likely secondary to severe reflux disease.  2. Gastroesophageal reflux disease with hiatal hernia, mild to      moderate severity.  3. Gout.  4. Hypertension.  5. Anxiety.  6. Hepatitis C, chronic.  7. Transient confusion secondary to pain medications.  8. Severe low back pain.  9. Chest pain.   DISCHARGE MEDICATIONS:  1. Avelox 400 mg daily for five more days.  2. Nexium 40 mg daily.  3. Paxil 60 mg daily.  4. Enalapril 20 mg daily.  5. Indomethacin 50 mg q.6h. p.r.n.  6. Atenolol 100 mg daily.   DISPOSITION:  The patient is currently stable, back to his baseline.  His chest CT and x-ray showed findings that cannot completely exclude a  tumor or a mass.  The recommendation is to repeat his chest CT in about  a month.  If the findings are still there, then patient may require  bronchoscopy.   CONSULTATION:  Dr. Jetty Duhamel, pulmonary.   PROCEDURES PERFORMED:  1. A chest x-ray performed on admission that showed probable pneumonia      in the right middle lobe and possibly superior segment of the right      lower lobe.  Recommended radiographic follow up until clearance.  2. CT angiogram on December31, 2007, of the chest showed limited due      to respiratory motion artifact as well as optimal pulmonary      arterial opacification.  There was no focal filling defects within      the main pulmonary arteries so PE was not seen.  There has been      overall progression of air space disease within the right lung      compared with a  prior CT in March 25, 2006.  This appearance      favored infectious etiology, possibly bacterial pneumonia.      However, the patient's disease has persisted since October so there      may be underlying lesion predisposing the patient to infection like      obstructive malignancy or chronic aspiration.  There was prominent      right paratracheal and precarinal lymph nodes which have increased      in size compared to the prior exam.  3. Modified barium swallow also on December31,2007, was essentially      normal.  4. Head CT without contrast on January01, 2008, showed no acute      intracranial abnormality.  5. Barium swallow on January03, 2007, showed small sliding hiatal      hernia with a mild to moderate degree of  distal esophageal reflux      noted with a water siphon maneuver.   BRIEF HISTORY AND PHYSICAL:  Please refer to dictated history and  physical.  In short, however, the patient is a 59 year old gentleman  that presented with cough and chest pain.  The patient has had pneumonia  back in October.  That seemed to have completely cleared and now he came  in with what appeared to be recurrent pneumonia.  He had some mild fever  in the ED at 100.9.  He had clear breath sounds.  No wheezing, no  crackles, but the patient was clearly coughing continuously.  His  respiration 18 and sats were 94% on room air.  His chest x-ray showed  the right middle lobe pneumonia and was subsequently admitted for  further management.   HOSPITAL COURSE:  PROBLEM #1 - RECURRENT PNEUMONIA:  The patient was  started on IV Avelox.  Workup consisted with checking for possible  aspiration with modified barium swallow that was initially normal.  Subsequent barium swallow showed that the patient was actually having  esophageal reflux.  Apparently patient has had long history of GERD but  has not been taking any PPI.  The current thinking is that the patient  may be aspirating from his moderate  gastroesophageal reflux.  I am  placing him back on Nexium and the patient will continue to take this as  an outpatient.   PROBLEM #2 - GASTROESOPHAGEAL REFLUX DISEASE:  The patient will continue  taking his PPIs.  If his symptoms persist of aspiration pneumonia, then  he will have to have a repeat CT and possible bronchoscopy.   PROBLEM #3 - HYPERTENSION:  This seems to be controlled on his home  regimen with no change.   PROBLEM #4 -  GOUT:  The patient had no problem with gout while in  hospital.   PROBLEM #5 - BACK PAIN:  The patient was having uncontrolled pain on his  back from probably continuous cough.  At this point, that has since  disappeared and the patient is stable.   PROBLEM #6 - TRANSIENT CONFUSION:  The patient was on narcotic pain  medications in the hospital.  At some point he was confused, thought to  be secondary to these medications.  They were tailored off and the  patient has been back to baseline.  Otherwise, his medical issues are  quite stable at this point and the patient will continue taking  antibiotics for the next 5 days.      Lonia Blood, M.D.  Electronically Signed     LG/MEDQ  D:  06/06/2006  T:  06/06/2006  Job:  [id number]   cc:   Lucky Cowboy, M.D.  Fax: [phone]
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
## 2010-10-19 NOTE — H&P (Signed)
Jonathan Wilson, Jonathan Wilson                 ACCOUNT NO.:  [id number]   MEDICAL RECORD NO.:  [id number]          PATIENT TYPE:  INP   LOCATION:  1519                         FACILITY:  [facility]   PHYSICIAN:  Theresia Bough, MD       DATE OF BIRTH:  07-06-1950   DATE OF ADMISSION:  06/01/2006  DATE OF DISCHARGE:                              HISTORY & PHYSICAL   PRESENTING COMPLAINT:  Cough and chest pain.   HISTORY OF PRESENT ILLNESS:  This is a 60 year old white male patient  who came in today because of cough. He had a history of pneumonia in  October, 2007. He was treated and discharged. He came back today  complaining of cough. This is associated with chest pain. He denied any  shortness of breath with this. He denies any hemoptysis. He denies any  headaches. No dizziness, no blurry vision. Chest pain is associated with  coughing. It is a 6 out of 10. The pain is relieved when he stops  coughing.  The patient had Pneumococcal pneumonia in October, 2007. At  that time he was treated in the ICU because of the severity of the  sickness. He came to the ER because of cough and fever. There is no  nausea, no vomiting or diarrhea. There is no dysuria. No swelling of  legs or feet.   PAST MEDICAL HISTORY:  1. Anxiety.  2. Gout.  3. History of hypertension.  4. Pneumonia.  5. Hepatitis C.   SOCIAL HISTORY:  The patient lives with his wife and family. He smokes  1/2 pack of cigarettes per day. He quit drinking alcohol. He also does  not abuse drugs at the present.   FAMILY HISTORY:  Cancer, diabetes and hypertension.   ALLERGIES:  No known drug allergies.   HOME MEDICATIONS:  1. Paxil 50 mg daily.  2. Enalapril 20 mg daily.  3. Diazepam 5 mg p.r.n. for anxiety.   PHYSICAL EXAMINATION:  VITAL SIGNS: Temperature 100.9, blood pressure  157/97, pulse rate 91, respirations 18, oxygen saturation 94% on room  air.  HEENT: Shows pink conjunctivae. There is no jaundice. Mucous membranes  are  moist.  NECK: Supple.  CHEST:  Auscultation shows clear breath sounds. There are no crackles,  no wheezing.  CARDIOVASCULAR: Shows normal heart sounds with no murmur and no gallop.  Pulses are palpable in extremities.  ABDOMEN: Soft, nontender, no masses palpable. Normal bowel sounds.  EXTREMITIES: Show no cyanosis, no edema. Full range of motion in all  joints.  NEURO: Alert and oriented x3 to person, place and time. Power is 4/4 in  all extremities.  _____________.   INITIAL LABORATORY DATA:  WBC 8.8 thousand which is normal, hemoglobin  14.4, hematocrit 41.2, platelets of 141,000, normal. Potassium 3.6,  chloride of 99, bicarb of 33, BUN of 9, creatinine 1.2, glucose of 103.   Chest x-ray shows right middle lobe pneumonia. EKG shows normal sinus  rhythm with left axis deviation. There is no ST, T changes. Cardiac  enzymes are not ordered. I am going to order cardiac enzymes now.  ASSESSMENT:  1. Pneumonia.  2. History of hypertension.  3. Chest pain most likely secondary to the pneumonia.   PLAN:  Admit patient to a telemetry bed. I am going to begin IV Levaquin  750 mg daily. The patient already received IV Rocephin and IV Zithromax.  The patient has blood taken for cultures. I will continue with IV  Levaquin 750 mg daily. Continue Enalapril 20 mg daily, Paxil 60 mg  daily, and Diazepam 5 mg p.o. q.h.s. p.r.n. for anxiety. The patient  will also have sputum for Gram stain.      Theresia Bough, MD  Electronically Signed     GA/MEDQ  D:  06/01/2006  T:  06/01/2006  Job:  [id number]

## 2010-11-01 ENCOUNTER — Encounter (HOSPITAL_COMMUNITY): Payer: Self-pay | Admitting: Physician Assistant

## 2010-11-02 ENCOUNTER — Encounter (HOSPITAL_COMMUNITY): Payer: Self-pay | Admitting: Physician Assistant

## 2010-11-09 ENCOUNTER — Encounter (HOSPITAL_COMMUNITY): Payer: Self-pay | Admitting: Licensed Clinical Social Worker

## 2010-11-19 ENCOUNTER — Emergency Department (HOSPITAL_COMMUNITY)
Admission: EM | Admit: 2010-11-19 | Discharge: 2010-11-19 | Disposition: A | Discharge status: Home / Self Care | Payer: Self-pay | Attending: Emergency Medicine | Admitting: Emergency Medicine

## 2010-11-19 ENCOUNTER — Encounter (HOSPITAL_COMMUNITY): Payer: Self-pay | Admitting: Licensed Clinical Social Worker

## 2010-11-19 DIAGNOSIS — F411 Generalized anxiety disorder: Secondary | ICD-10-CM | POA: Insufficient documentation

## 2010-11-19 DIAGNOSIS — Z79899 Other long term (current) drug therapy: Secondary | ICD-10-CM | POA: Insufficient documentation

## 2010-11-29 ENCOUNTER — Encounter (HOSPITAL_COMMUNITY): Payer: Self-pay | Admitting: Physician Assistant

## 2010-12-25 ENCOUNTER — Encounter (HOSPITAL_COMMUNITY): Payer: Self-pay | Admitting: Physician Assistant

## 2011-03-08 LAB — POCT CARDIAC MARKERS
CKMB, poc: <1 ng/mL — ABNORMAL LOW (ref 1.0–8.0)
Myoglobin, poc: 78.6 ng/mL (ref 12–200)
Troponin i, poc: <0.05 ng/mL (ref 0.00–0.09)

## 2013-02-22 ENCOUNTER — Emergency Department (HOSPITAL_COMMUNITY): Payer: Self-pay

## 2013-02-22 ENCOUNTER — Encounter (HOSPITAL_COMMUNITY): Payer: Self-pay | Admitting: Emergency Medicine

## 2013-02-22 ENCOUNTER — Emergency Department (HOSPITAL_COMMUNITY)
Admission: EM | Admit: 2013-02-22 | Discharge: 2013-02-22 | Disposition: A | Discharge status: Home / Self Care | Payer: Self-pay | Attending: Emergency Medicine | Admitting: Emergency Medicine

## 2013-02-22 DIAGNOSIS — J40 Bronchitis, not specified as acute or chronic: Secondary | ICD-10-CM | POA: Insufficient documentation

## 2013-02-22 DIAGNOSIS — R42 Dizziness and giddiness: Secondary | ICD-10-CM | POA: Insufficient documentation

## 2013-02-22 DIAGNOSIS — R062 Wheezing: Secondary | ICD-10-CM | POA: Insufficient documentation

## 2013-02-22 DIAGNOSIS — R197 Diarrhea, unspecified: Secondary | ICD-10-CM | POA: Insufficient documentation

## 2013-02-22 DIAGNOSIS — R509 Fever, unspecified: Secondary | ICD-10-CM | POA: Insufficient documentation

## 2013-02-22 DIAGNOSIS — E860 Dehydration: Secondary | ICD-10-CM | POA: Insufficient documentation

## 2013-02-22 HISTORY — DX: Pneumonia, unspecified organism: J18.9

## 2013-02-22 LAB — CBC WITH DIFFERENTIAL/PLATELET
Basophils Relative: 0 % (ref 0–1)
Eosinophils Absolute: 0.1 10*3/uL (ref 0.0–0.7)
Hemoglobin: 17.1 g/dL — ABNORMAL HIGH (ref 13.0–17.0)
Lymphs Abs: 1.5 10*3/uL (ref 0.7–4.0)
Monocytes Relative: 8 % (ref 3–12)
Neutro Abs: 4.9 10*3/uL (ref 1.7–7.7)
Neutrophils Relative %: 69 % (ref 43–77)
Platelets: 167 10*3/uL (ref 150–400)
RBC: 5.16 MIL/uL (ref 4.22–5.81)

## 2013-02-22 LAB — COMPREHENSIVE METABOLIC PANEL
ALT: 16 U/L (ref 0–53)
Albumin: 4.3 g/dL (ref 3.5–5.2)
Alkaline Phosphatase: 79 U/L (ref 39–117)
BUN: 11 mg/dL (ref 6–23)
Chloride: 100 mEq/L (ref 96–112)
Glucose, Bld: 99 mg/dL (ref 70–99)
Potassium: 5.4 mEq/L — ABNORMAL HIGH (ref 3.5–5.1)
Sodium: 139 mEq/L (ref 135–145)
Total Bilirubin: 0.6 mg/dL (ref 0.3–1.2)
Total Protein: 7.5 g/dL (ref 6.0–8.3)

## 2013-02-22 LAB — URINALYSIS, ROUTINE W REFLEX MICROSCOPIC
Bilirubin Urine: NEGATIVE
Glucose, UA: NEGATIVE mg/dL
Ketones, ur: NEGATIVE mg/dL
Protein, ur: NEGATIVE mg/dL
pH: 7 (ref 5.0–8.0)

## 2013-02-22 MED ORDER — ACETAMINOPHEN 325 MG PO TABS
650.0000 mg | ORAL_TABLET | Freq: Once | ORAL | Status: AC
  Administered 2013-02-22: 650 mg via ORAL
  Filled 2013-02-22: qty 2

## 2013-02-22 MED ORDER — SODIUM CHLORIDE 0.9 % IV BOLUS (SEPSIS)
1000.0000 mL | Freq: Once | INTRAVENOUS | Status: AC
  Administered 2013-02-22: 1000 mL via INTRAVENOUS

## 2013-02-22 MED ORDER — SODIUM CHLORIDE 0.9 % IV SOLN
INTRAVENOUS | Status: DC
  Administered 2013-02-22: 10:00:00 via INTRAVENOUS

## 2013-02-22 MED ORDER — METHOCARBAMOL 750 MG PO TABS: 750.0000 mg | ORAL_TABLET | Freq: Four times a day (QID) | ORAL | Status: DC

## 2013-02-22 MED ORDER — ALBUTEROL SULFATE HFA 108 (90 BASE) MCG/ACT IN AERS
2.0000 | INHALATION_SPRAY | RESPIRATORY_TRACT | Status: DC
  Administered 2013-02-22: 2 via RESPIRATORY_TRACT
  Filled 2013-02-22: qty 6.7

## 2013-02-22 MED ORDER — IBUPROFEN 200 MG PO TABS
400.0000 mg | ORAL_TABLET | Freq: Once | ORAL | Status: AC
  Administered 2013-02-22: 400 mg via ORAL
  Filled 2013-02-22: qty 2

## 2013-02-22 NOTE — ED Provider Notes (Signed)
CSN: [id number]     Arrival date & time 02/22/13  0865 History   First MD Initiated Contact with Patient 02/22/13 [phone]     Chief Complaint  Patient presents with  . Shortness of Breath  . Nasal Congestion  . Dehydration   (Consider location/radiation/quality/duration/timing/severity/associated sxs/prior Treatment) Patient is a 62 y.o. male presenting with shortness of breath. The history is provided by the patient.  Shortness of Breath Severity:  Moderate Onset quality:  Gradual Timing:  Constant Progression:  Worsening Chronicity:  New Relieved by:  Nothing Worsened by:  Nothing tried Ineffective treatments:  None tried Associated symptoms: cough, fever and wheezing   Associated symptoms: no vomiting   pt also with worsening watery diarrhea without blood--no new meds--denies severe abd pain, no urinary sx--drinking gatorade with difficluty-notes dizziness with standing  No past medical history on file. No past surgical history on file. No family history on file. History  Substance Use Topics  . Smoking status: Not on file  . Smokeless tobacco: Not on file  . Alcohol Use: Not on file    Review of Systems  Constitutional: Positive for fever.  Respiratory: Positive for cough, shortness of breath and wheezing.   Gastrointestinal: Negative for vomiting.  All other systems reviewed and are negative.    Allergies  Review of patient's allergies indicates not on file.  Home Medications  No current outpatient prescriptions on file. BP 160/120  Pulse 69  Temp(Src) 97.9 F (36.6 C) (Oral)  Resp 16  SpO2 99% Physical Exam  Nursing note and vitals reviewed. Constitutional: He is oriented to person, place, and time. He appears well-developed and well-nourished.  Non-toxic appearance. No distress.  HENT:  Head: Normocephalic and atraumatic.  Eyes: Conjunctivae, EOM and lids are normal. Pupils are equal, round, and reactive to light.  Neck: Normal range of motion. Neck  supple. No tracheal deviation present. No mass present.  Cardiovascular: Normal rate, regular rhythm and normal heart sounds.  Exam reveals no gallop.   No murmur heard. Pulmonary/Chest: Effort normal. No stridor. No respiratory distress. He has decreased breath sounds. He has wheezes. He has no rhonchi. He has no rales.  Abdominal: Soft. Normal appearance and bowel sounds are normal. He exhibits no distension. There is no tenderness. There is no rebound and no CVA tenderness.  Musculoskeletal: Normal range of motion. He exhibits no edema and no tenderness.  Neurological: He is alert and oriented to person, place, and time. He has normal strength. No cranial nerve deficit or sensory deficit. GCS eye subscore is 4. GCS verbal subscore is 5. GCS motor subscore is 6.  Skin: Skin is warm and dry. No abrasion and no rash noted.  Psychiatric: He has a normal mood and affect. His speech is normal and behavior is normal.    ED Course  Procedures (including critical care time) Labs Review Labs Reviewed  CBC WITH DIFFERENTIAL  COMPREHENSIVE METABOLIC PANEL  URINALYSIS, ROUTINE W REFLEX MICROSCOPIC   Imaging Review No results found.  MDM  No diagnosis found. Pt given albuterol here for mild wheezes, will treat for bronchitis    Toy Baker, MD 02/22/13 1306

## 2013-02-22 NOTE — Progress Notes (Signed)
[facility] provided pt with a list of primary care resources. Patient stated that he was pending insurance through his job.  °

## 2013-02-22 NOTE — ED Notes (Signed)
Pt c/o of SOB, dehydration, dirrhea and congestion for the past 2 days. States that he feels like he has pneumonia again. Hx of pnemonia. Current everyday smoker. Denies fever n/v.

## 2013-04-13 ENCOUNTER — Other Ambulatory Visit: Payer: Self-pay | Admitting: Internal Medicine

## 2013-04-18 ENCOUNTER — Encounter: Payer: Self-pay | Admitting: Internal Medicine

## 2013-04-18 DIAGNOSIS — E559 Vitamin D deficiency, unspecified: Secondary | ICD-10-CM | POA: Insufficient documentation

## 2013-04-18 DIAGNOSIS — I10 Essential (primary) hypertension: Secondary | ICD-10-CM | POA: Insufficient documentation

## 2013-04-18 DIAGNOSIS — E785 Hyperlipidemia, unspecified: Secondary | ICD-10-CM | POA: Insufficient documentation

## 2013-04-19 ENCOUNTER — Encounter: Payer: Self-pay | Admitting: Internal Medicine

## 2013-04-19 ENCOUNTER — Ambulatory Visit (INDEPENDENT_AMBULATORY_CARE_PROVIDER_SITE_OTHER): Payer: Self-pay | Admitting: Internal Medicine

## 2013-04-19 VITALS — BP 112/68 | HR 60 | Temp 98.1°F | Resp 18 | Ht 74.0 in | Wt 267.8 lb

## 2013-04-19 DIAGNOSIS — E559 Vitamin D deficiency, unspecified: Secondary | ICD-10-CM

## 2013-04-19 DIAGNOSIS — R7309 Other abnormal glucose: Secondary | ICD-10-CM

## 2013-04-19 DIAGNOSIS — I10 Essential (primary) hypertension: Secondary | ICD-10-CM

## 2013-04-19 DIAGNOSIS — M109 Gout, unspecified: Secondary | ICD-10-CM

## 2013-04-19 DIAGNOSIS — E782 Mixed hyperlipidemia: Secondary | ICD-10-CM

## 2013-04-19 NOTE — Progress Notes (Signed)
Patient ID: Jonathan Wilson, male   DOB: Nov 20, 1950, 62 y.o.   MRN: [id number]   This very nice 62 yo MWM  presents for 3 month follow up with hypertension, hyperlipidemia, pre-diabetes, obesity, gout and vitamin D deficiency.    BP has been controlled at home. Today's BP is 112/68. Patient denies any cardiac type chest pain, palpitations, dyspnea, dizziness, claudication, or dependent edema.   Hyperlipidemia is controlled with diet & meds. Last cholesterol was188, Triglycerides were  172, HDL 29, and LDL 125 in may this year.. Patient denies myalgias or other med SE's.    Also, the patient has history of prediabetes/insulin resistance with last A1c of 5.0% on may. Patient denies any symptoms of reactive hypoglycemia, diabetic polys, paresthesias or visual blurring.   Patient is on medication for gout and has been educated in avoidance of dietary triggers, including alcohol.   Further, Patient has history of vitamin D deficiency. Patient supplements vitamin without any suspected side-effects.  Current Outpatient Prescriptions on File Prior to Visit  Medication Sig Dispense Refill  . allopurinol (ZYLOPRIM) 300 MG tablet Take 300 mg by mouth daily.      [name] ALPRAZolam (XANAX) 1 MG tablet Take 1 mg by mouth 3 (three) times daily.      [name] aspirin EC 81 MG tablet Take 81 mg by mouth daily.      [name] atenolol (TENORMIN) 100 MG tablet TAKE ONE TABLET BY MOUTH ONCE DAILY FOR BLOOD PRESSURE  90 tablet  1  . cholecalciferol (VITAMIN D) 1000 UNITS tablet Take 5,000 Units by mouth daily.      . citalopram (CELEXA) 40 MG tablet Take 40 mg by mouth daily.      . Cyanocobalamin (VITAMIN B-12 CR PO) Take 1 tablet by mouth daily.      [name] lisinopril (PRINIVIL,ZESTRIL) 20 MG tablet Take 20 mg by mouth daily.      . Multiple Vitamin (MULTIVITAMIN WITH MINERALS) TABS tablet Take 1 tablet by mouth daily.      . simvastatin (ZOCOR) 20 MG tablet Take 20 mg by mouth daily.        Allergies  Allergen Reactions  . Tylenol  [Acetaminophen]     Intolerant to Tylenol with codeine    PMHx:   Past Medical History  Diagnosis Date  . Pneumonia   . Hypertension   . Hyperlipidemia   . Gout   . Vitamin D deficiency   . Hepatitis C   . Arthritis     FHx:    Reviewed / unchanged  SHx:    Reviewed / unchanged  Systems Review: Constitutional: Denies fever, chills, wt changes, headaches, insomnia, fatigue, night sweats, change in appetite. Eyes: Denies redness, blurred vision, diplopia, discharge, itchy, watery eyes.  ENT: Denies discharge, congestion, post nasal drip, epistaxis, sore throat, earache, hearing loss, dental pain, tinnitus, vertigo, sinus pain, snoring.  CV: Denies chest pain, palpitations, irregular heartbeat, syncope, dyspnea, diaphoresis, orthopnea, PND, claudication, edema. Respiratory: denies cough, dyspnea, DOE, pleurisy, hoarseness, laryngitis, wheezing.  Gastrointestinal: Denies dysphagia, odynophagia, heartburn, reflux, water brash, abdominal pain or cramps, nausea, vomiting, bloating, diarrhea, constipation, hematemesis, melena, hematochezia,  Hemorrhoids. Genitourinary: Denies dysuria, frequency, urgency, nocturia, hesitancy, discharge, hematuria, flank pain. Musculoskeletal: Denies arthralgias, myalgias, stiffness, jt. swelling, pain, limp, strain/sprain.  Skin: Denies pruritus, rash, hives, warts, acne, eczema, change in skin lesion(s). Neuro: No weakness, tremor, incoordination, spasms, paresthesia, or pain. Psychiatric: Denies confusion, memory loss, or sensory loss. Endo: Denies change in weight, skin, hair change.  Heme/Lymph:  No excessive bleeding, bruising, orenlarged lymph nodes.  Vital Signs recorded WNL  Estimated body mass index is 35.59 kg/(m^2) as calculated from the following:   Height as of 01/30/09: 6' 1.5" (1.867 m).   Weight as of 07/17/10: 273 lb 8 oz (124.059 kg).  On Exam: Appears well nourished - in no distress. Eyes: PERRLA, EOMs, conjunctiva no swelling or  erythema. Sinuses: No frontal/maxillary tenderness ENT/Mouth: EAC's clear, TM's nl w/o erythema, bulging. Nares clear w/o erythema, swelling, exudates. Oropharynx clear without erythema or exudates. Oral hygiene is good. Tongue normal, non obstructing. Hearing intact.  Neck: Supple. Thyroid nl. Car 2+/2+ without bruits, nodes or JVD. Chest: Respirations nl with BS clear & equal w/o rales, rhonchi, wheezing or stridor.  Cor: Heart sounds normal w/ regular rate and rhythm without sig. murmurs and without edema.  Abdomen: Soft & bowel sounds normal. Non-tender w/o guarding, rebound, hernias, masses, or organomegaly.  Musculoskeletal: Full ROM all peripheral extremities, joint stability, 5/5 strength, and normal gait.  Skin: Warm, dry without exposed rashes, lesions, ecchymosis apparent.  Neuro: Cranial nerves intact, reflexes equal bilaterally. Sensory-motor testing grossly intact. Tendon reflexes grossly intact.  Pysch: Alert & oriented x 3. Insight and judgement nl & appropriate. No ideations.  Assessment and Plan:  1. Hypertension - Continue monitor blood pressure at home. Continue diet/meds same.  2. Hyperlipidemia - Continue diet/meds, exercise, & lifestyle modifications. Continue monitor periodic cholesterol/liver & renal functions   3. Pre-diabetes/Insulin Resistance - Continue diet, exercise, lifestyle modifications. Monitor appropriate labs.  4. Vitamin D Deficiency - Continue supplementation.  5. Gout - continue prudent diet and meds as discussed.  Defer labs today as patient has just changed again and currently has no 3rd party insurance to cover payment. As he is doing well, I feel we can safely wait on labs til 3 month f/u - unless he obtains insurance coverage sooner.

## 2013-04-19 NOTE — Patient Instructions (Signed)
Continue diet & medications same. As discussed. Further disposition pending lab results.  Hypertension As your heart beats, it forces blood through your arteries. This force is your blood pressure. If the pressure is too high, it is called hypertension (HTN) or high blood pressure. HTN is dangerous because you may have it and not know it. High blood pressure may mean that your heart has to work harder to pump blood. Your arteries may be narrow or stiff. The extra work puts you at risk for heart disease, stroke, and other problems.  Blood pressure consists of two numbers, a higher number over a lower, 110/72, for example. It is stated as "110 over 72." The ideal is below 120 for the top number (systolic) and under 80 for the bottom (diastolic). Write down your blood pressure today. You should pay close attention to your blood pressure if you have certain conditions such as:  Heart failure.  Prior heart attack.  Diabetes  Chronic kidney disease.  Prior stroke.  Multiple risk factors for heart disease. To see if you have HTN, your blood pressure should be measured while you are seated with your arm held at the level of the heart. It should be measured at least twice. A one-time elevated blood pressure reading (especially in the Emergency Department) does not mean that you need treatment. There may be conditions in which the blood pressure is different between your right and left arms. It is important to see your caregiver soon for a recheck. Most people have essential hypertension which means that there is not a specific cause. This type of high blood pressure may be lowered by changing lifestyle factors such as:  Stress.  Smoking.  Lack of exercise.  Excessive weight.  Drug/tobacco/alcohol use.  Eating less salt. Most people do not have symptoms from high blood pressure until it has caused damage to the body. Effective treatment can often prevent, delay or reduce that  damage. TREATMENT  When a cause has been identified, treatment for high blood pressure is directed at the cause. There are a large number of medications to treat HTN. These fall into several categories, and your caregiver will help you select the medicines that are best for you. Medications may have side effects. You should review side effects with your caregiver. If your blood pressure stays high after you have made lifestyle changes or started on medicines,   Your medication(s) may need to be changed.  Other problems may need to be addressed.  Be certain you understand your prescriptions, and know how and when to take your medicine.  Be sure to follow up with your caregiver within the time frame advised (usually within two weeks) to have your blood pressure rechecked and to review your medications.  If you are taking more than one medicine to lower your blood pressure, make sure you know how and at what times they should be taken. Taking two medicines at the same time can result in blood pressure that is too low. SEEK IMMEDIATE MEDICAL CARE IF:  You develop a severe headache, blurred or changing vision, or confusion.  You have unusual weakness or numbness, or a faint feeling.  You have severe chest or abdominal pain, vomiting, or breathing problems. MAKE SURE YOU:   Understand these instructions.  Will watch your condition.  Will get help right away if you are not doing well or get worse. Document Released: 05/20/2005 Document Revised: 08/12/2011 Document Reviewed: 01/08/2008 [facility] Patient Information 2014 [place].   Cholesterol  Cholesterol is a white, waxy, fat-like protein needed by your body in small amounts. The liver makes all the cholesterol you need. It is carried from the liver by the blood through the blood vessels. Deposits (plaque) may build up on blood vessel walls. This makes the arteries narrower and stiffer. Plaque increases the risk for heart attack and  stroke. You cannot feel your cholesterol level even if it is very high. The only way to know is by a blood test to check your lipid (fats) levels. Once you know your cholesterol levels, you should keep a record of the test results. Work with your caregiver to to keep your levels in the desired range. WHAT THE RESULTS MEAN:  Total cholesterol is a rough measure of all the cholesterol in your blood.  LDL is the so-called bad cholesterol. This is the type that deposits cholesterol in the walls of the arteries. You want this level to be low.  HDL is the good cholesterol because it cleans the arteries and carries the LDL away. You want this level to be high.  Triglycerides are fat that the body can either burn for energy or store. High levels are closely linked to heart disease. DESIRED LEVELS:  Total cholesterol below 200.  LDL below 100 for people at risk, below 70 for very high risk.  HDL above 50 is good, above 60 is best.  Triglycerides below 150. HOW TO LOWER YOUR CHOLESTEROL:  Diet.  Choose fish or white meat chicken and [place], roasted or baked. Limit fatty cuts of red meat, fried foods, and processed meats, such as sausage and lunch meat.  Eat lots of fresh fruits and vegetables. Choose whole grains, beans, pasta, potatoes and cereals.  Use only small amounts of olive, corn or canola oils. Avoid butter, mayonnaise, shortening or palm kernel oils. Avoid foods with trans-fats.  Use skim/nonfat milk and low-fat/nonfat yogurt and cheeses. Avoid whole milk, cream, ice cream, egg yolks and cheeses. Healthy desserts include angel food cake, ginger snaps, animal crackers, hard candy, popsicles, and low-fat/nonfat frozen yogurt. Avoid pastries, cakes, pies and cookies.  Exercise.  A regular program helps decrease LDL and raises HDL.  Helps with weight control.  Do things that increase your activity level like gardening, walking, or taking the stairs.  Medication.  May be  prescribed by your caregiver to help lowering cholesterol and the risk for heart disease.  You may need medicine even if your levels are normal if you have several risk factors. HOME CARE INSTRUCTIONS   Follow your diet and exercise programs as suggested by your caregiver.  Take medications as directed.  Have blood work done when your caregiver feels it is necessary. MAKE SURE YOU:   Understand these instructions.  Will watch your condition.  Will get help right away if you are not doing well or get worse. Document Released: 02/12/2001 Document Revised: 08/12/2011 Document Reviewed: 08/05/2007 [facility] Patient Information 2014 [place], [place].    Diabetes and Exercise Exercising regularly is important. It is not just about losing weight. It has many health benefits, such as:  Improving your overall fitness, flexibility, and endurance.  Increasing your bone density.  Helping with weight control.  Decreasing your body fat.  Increasing your muscle strength.  Reducing stress and tension.  Improving your overall health. People with diabetes who exercise gain additional benefits because exercise:  Reduces appetite.  Improves the body's use of blood sugar (glucose).  Helps lower or control blood glucose.  Decreases blood pressure.  Helps control blood lipids (such as cholesterol and triglycerides).  Improves the body's use of the hormone insulin by:  Increasing the body's insulin sensitivity.  Reducing the body's insulin needs.  Decreases the risk for heart disease because exercising:  Lowers cholesterol and triglycerides levels.  Increases the levels of good cholesterol (such as high-density lipoproteins [HDL]) in the body.  Lowers blood glucose levels. YOUR ACTIVITY PLAN  Choose an activity that you enjoy and set realistic goals. Your health care provider or diabetes educator can help you make an activity plan that works for you. You can break activities  into 2 or 3 sessions throughout the day. Doing so is as good as one long session. Exercise ideas include:  Taking the dog for a walk.  Taking the stairs instead of the elevator.  Dancing to your favorite song.  Doing your favorite exercise with a friend. RECOMMENDATIONS FOR EXERCISING WITH TYPE 1 OR TYPE 2 DIABETES   Check your blood glucose before exercising. If blood glucose levels are greater than 240 mg/dL, check for urine ketones. Do not exercise if ketones are present.  Avoid injecting insulin into areas of the body that are going to be exercised. For example, avoid injecting insulin into:  The arms when playing tennis.  The legs when jogging.  Keep a record of:  Food intake before and after you exercise.  Expected peak times of insulin action.  Blood glucose levels before and after you exercise.  The type and amount of exercise you have done.  Review your records with your health care provider. Your health care provider will help you to develop guidelines for adjusting food intake and insulin amounts before and after exercising.  If you take insulin or oral hypoglycemic agents, watch for signs and symptoms of hypoglycemia. They include:  Dizziness.  Shaking.  Sweating.  Chills.  Confusion.  Drink plenty of water while you exercise to prevent dehydration or heat stroke. Body water is lost during exercise and must be replaced.  Talk to your health care provider before starting an exercise program to make sure it is safe for you. Remember, almost any type of activity is better than none. Document Released: 08/10/2003 Document Revised: 01/20/2013 Document Reviewed: 10/27/2012 [facility] Patient Information 2014 [place], [place].   Gout Gout is an inflammatory arthritis caused by a buildup of uric acid crystals in the joints. Uric acid is a chemical that is normally present in the blood. When the level of uric acid in the blood is too high it can form crystals that  deposit in your joints and tissues. This causes joint redness, soreness, and swelling (inflammation). Repeat attacks are common. Over time, uric acid crystals can form into masses (tophi) near a joint, destroying bone and causing disfigurement. Gout is treatable and often preventable. CAUSES  The disease begins with elevated levels of uric acid in the blood. Uric acid is produced by your body when it breaks down a naturally found substance called purines. Certain foods you eat, such as meats and fish, contain high amounts of purines. Causes of an elevated uric acid level include:  Being passed down from parent to child (heredity).  Diseases that cause increased uric acid production (such as obesity, psoriasis, and certain cancers).  Excessive alcohol use.  Diet, especially diets rich in meat and seafood.  Medicines, including certain cancer-fighting medicines (chemotherapy), water pills (diuretics), and aspirin.  Chronic kidney disease. The kidneys are no longer able to remove uric acid well.  Problems with metabolism.  Conditions strongly associated with gout include:  Obesity.  High blood pressure.  High cholesterol.  Diabetes. Not everyone with elevated uric acid levels gets gout. It is not understood why some people get gout and others do not. Surgery, joint injury, and eating too much of certain foods are some of the factors that can lead to gout attacks. SYMPTOMS   An attack of gout comes on quickly. It causes intense pain with redness, swelling, and warmth in a joint.  Fever can occur.  Often, only one joint is involved. Certain joints are more commonly involved:  Base of the big toe.  Knee.  Ankle.  Wrist.  Finger. Without treatment, an attack usually goes away in a few days to weeks. Between attacks, you usually will not have symptoms, which is different from many other forms of arthritis. DIAGNOSIS  Your caregiver will suspect gout based on your symptoms and  exam. In some cases, tests may be recommended. The tests may include:  Blood tests.  Urine tests.  X-rays.  Joint fluid exam. This exam requires a needle to remove fluid from the joint (arthrocentesis). Using a microscope, gout is confirmed when uric acid crystals are seen in the joint fluid. TREATMENT  There are two phases to gout treatment: treating the sudden onset (acute) attack and preventing attacks (prophylaxis).  Treatment of an Acute Attack.  Medicines are used. These include anti-inflammatory medicines or steroid medicines.  An injection of steroid medicine into the affected joint is sometimes necessary.  The painful joint is rested. Movement can worsen the arthritis.  You may use warm or cold treatments on painful joints, depending which works best for you.  Treatment to Prevent Attacks.  If you suffer from frequent gout attacks, your caregiver may advise preventive medicine. These medicines are started after the acute attack subsides. These medicines either help your kidneys eliminate uric acid from your body or decrease your uric acid production. You may need to stay on these medicines for a very long time.  The early phase of treatment with preventive medicine can be associated with an increase in acute gout attacks. For this reason, during the first few months of treatment, your caregiver may also advise you to take medicines usually used for acute gout treatment. Be sure you understand your caregiver's directions. Your caregiver may make several adjustments to your medicine dose before these medicines are effective.  Discuss dietary treatment with your caregiver or dietitian. Alcohol and drinks high in sugar and fructose and foods such as meat, poultry, and seafood can increase uric acid levels. Your caregiver or dietician can advise you on drinks and foods that should be limited. HOME CARE INSTRUCTIONS   Do not take aspirin to relieve pain. This raises uric acid  levels.  Only take over-the-counter or prescription medicines for pain, discomfort, or fever as directed by your caregiver.  Rest the joint as much as possible. When in bed, keep sheets and blankets off painful areas.  Keep the affected joint raised (elevated).  Apply warm or cold treatments to painful joints. Use of warm or cold treatments depends on which works best for you.  Use crutches if the painful joint is in your leg.  Drink enough fluids to keep your urine clear or pale yellow. This helps your body get rid of uric acid. Limit alcohol, sugary drinks, and fructose drinks.  Follow your dietary instructions. Pay careful attention to the amount of protein you eat. Your daily diet should emphasize fruits, vegetables, whole grains,  and fat-free or low-fat milk products. Discuss the use of coffee, vitamin C, and cherries with your caregiver or dietician. These may be helpful in lowering uric acid levels.  Maintain a healthy body weight. SEEK MEDICAL CARE IF:   You develop diarrhea, vomiting, or any side effects from medicines.  You do not feel better in 24 hours, or you are getting worse. SEEK IMMEDIATE MEDICAL CARE IF:   Your joint becomes suddenly more tender, and you have chills or a fever. MAKE SURE YOU:   Understand these instructions.  Will watch your condition.  Will get help right away if you are not doing well or get worse. Document Released: 05/17/2000 Document Revised: 09/14/2012 Document Reviewed: 01/01/2012 [facility] Patient Information 2014 [place], [place].

## 2013-05-18 ENCOUNTER — Other Ambulatory Visit: Payer: Self-pay

## 2013-05-18 MED ORDER — CITALOPRAM HYDROBROMIDE 40 MG PO TABS: 40.0000 mg | ORAL_TABLET | Freq: Every day | ORAL | Status: DC

## 2013-05-31 ENCOUNTER — Other Ambulatory Visit: Payer: Self-pay | Admitting: Internal Medicine

## 2013-06-03 DIAGNOSIS — J101 Influenza due to other identified influenza virus with other respiratory manifestations: Secondary | ICD-10-CM

## 2013-06-03 HISTORY — DX: Influenza due to other identified influenza virus with other respiratory manifestations: J10.1

## 2013-06-15 ENCOUNTER — Other Ambulatory Visit: Payer: Self-pay | Admitting: Internal Medicine

## 2013-06-21 ENCOUNTER — Emergency Department (HOSPITAL_COMMUNITY): Payer: Self-pay

## 2013-06-21 ENCOUNTER — Inpatient Hospital Stay (HOSPITAL_COMMUNITY)
Admission: EM | Admit: 2013-06-21 | Discharge: 2013-06-23 | DRG: 189 | Disposition: A | Discharge status: Home / Self Care | Payer: Self-pay | Attending: Family Medicine | Admitting: Family Medicine

## 2013-06-21 ENCOUNTER — Encounter (HOSPITAL_COMMUNITY): Payer: Self-pay | Admitting: Emergency Medicine

## 2013-06-21 DIAGNOSIS — B1920 Unspecified viral hepatitis C without hepatic coma: Secondary | ICD-10-CM | POA: Diagnosis present

## 2013-06-21 DIAGNOSIS — I10 Essential (primary) hypertension: Secondary | ICD-10-CM | POA: Diagnosis present

## 2013-06-21 DIAGNOSIS — F17200 Nicotine dependence, unspecified, uncomplicated: Secondary | ICD-10-CM | POA: Diagnosis present

## 2013-06-21 DIAGNOSIS — Z7982 Long term (current) use of aspirin: Secondary | ICD-10-CM

## 2013-06-21 DIAGNOSIS — M109 Gout, unspecified: Secondary | ICD-10-CM | POA: Diagnosis present

## 2013-06-21 DIAGNOSIS — E785 Hyperlipidemia, unspecified: Secondary | ICD-10-CM | POA: Diagnosis present

## 2013-06-21 DIAGNOSIS — R5381 Other malaise: Secondary | ICD-10-CM | POA: Diagnosis present

## 2013-06-21 DIAGNOSIS — F411 Generalized anxiety disorder: Secondary | ICD-10-CM

## 2013-06-21 DIAGNOSIS — Z833 Family history of diabetes mellitus: Secondary | ICD-10-CM

## 2013-06-21 DIAGNOSIS — R0602 Shortness of breath: Secondary | ICD-10-CM

## 2013-06-21 DIAGNOSIS — Z800 Family history of malignant neoplasm of digestive organs: Secondary | ICD-10-CM

## 2013-06-21 DIAGNOSIS — Z8701 Personal history of pneumonia (recurrent): Secondary | ICD-10-CM

## 2013-06-21 DIAGNOSIS — I452 Bifascicular block: Secondary | ICD-10-CM | POA: Diagnosis present

## 2013-06-21 DIAGNOSIS — R7989 Other specified abnormal findings of blood chemistry: Secondary | ICD-10-CM

## 2013-06-21 DIAGNOSIS — J449 Chronic obstructive pulmonary disease, unspecified: Secondary | ICD-10-CM

## 2013-06-21 DIAGNOSIS — J9600 Acute respiratory failure, unspecified whether with hypoxia or hypercapnia: Principal | ICD-10-CM | POA: Diagnosis present

## 2013-06-21 DIAGNOSIS — J9601 Acute respiratory failure with hypoxia: Secondary | ICD-10-CM

## 2013-06-21 DIAGNOSIS — J111 Influenza due to unidentified influenza virus with other respiratory manifestations: Secondary | ICD-10-CM | POA: Diagnosis present

## 2013-06-21 DIAGNOSIS — F29 Unspecified psychosis not due to a substance or known physiological condition: Secondary | ICD-10-CM

## 2013-06-21 DIAGNOSIS — Z6833 Body mass index (BMI) 33.0-33.9, adult: Secondary | ICD-10-CM

## 2013-06-21 DIAGNOSIS — R5383 Other fatigue: Secondary | ICD-10-CM

## 2013-06-21 DIAGNOSIS — E559 Vitamin D deficiency, unspecified: Secondary | ICD-10-CM | POA: Diagnosis present

## 2013-06-21 DIAGNOSIS — Z8619 Personal history of other infectious and parasitic diseases: Secondary | ICD-10-CM

## 2013-06-21 DIAGNOSIS — N179 Acute kidney failure, unspecified: Secondary | ICD-10-CM | POA: Diagnosis not present

## 2013-06-21 DIAGNOSIS — M549 Dorsalgia, unspecified: Secondary | ICD-10-CM

## 2013-06-21 DIAGNOSIS — R9431 Abnormal electrocardiogram [ECG] [EKG]: Secondary | ICD-10-CM | POA: Diagnosis present

## 2013-06-21 DIAGNOSIS — M129 Arthropathy, unspecified: Secondary | ICD-10-CM | POA: Diagnosis present

## 2013-06-21 DIAGNOSIS — J441 Chronic obstructive pulmonary disease with (acute) exacerbation: Secondary | ICD-10-CM | POA: Diagnosis present

## 2013-06-21 DIAGNOSIS — R69 Illness, unspecified: Secondary | ICD-10-CM

## 2013-06-21 DIAGNOSIS — K219 Gastro-esophageal reflux disease without esophagitis: Secondary | ICD-10-CM | POA: Diagnosis present

## 2013-06-21 LAB — HEPATIC FUNCTION PANEL
ALK PHOS: 74 U/L (ref 39–117)
ALT: 32 U/L (ref 0–53)
AST: 76 U/L — ABNORMAL HIGH (ref 0–37)
Albumin: 4.1 g/dL (ref 3.5–5.2)
Total Bilirubin: 0.7 mg/dL (ref 0.3–1.2)
Total Protein: 7.9 g/dL (ref 6.0–8.3)

## 2013-06-21 LAB — CBC
HEMATOCRIT: 44.9 % (ref 39.0–52.0)
HEMATOCRIT: 44.2 % (ref 39.0–52.0)
HEMOGLOBIN: 16.1 g/dL (ref 13.0–17.0)
Hemoglobin: 15.2 g/dL (ref 13.0–17.0)
MCH: 33.8 pg (ref 26.0–34.0)
MCH: 33.5 pg (ref 26.0–34.0)
MCHC: 35.9 g/dL (ref 30.0–36.0)
MCHC: 34.4 g/dL (ref 30.0–36.0)
MCV: 94.1 fL (ref 78.0–100.0)
MCV: 97.4 fL (ref 78.0–100.0)
Platelets: 119 10*3/uL — ABNORMAL LOW (ref 150–400)
Platelets: 121 10*3/uL — ABNORMAL LOW (ref 150–400)
RBC: 4.77 MIL/uL (ref 4.22–5.81)
RBC: 4.54 MIL/uL (ref 4.22–5.81)
RDW: 12 % (ref 11.5–15.5)
RDW: 12.2 % (ref 11.5–15.5)
WBC: 6 10*3/uL (ref 4.0–10.5)
WBC: 6.3 10*3/uL (ref 4.0–10.5)

## 2013-06-21 LAB — BASIC METABOLIC PANEL
BUN: 19 mg/dL (ref 6–23)
CO2: 24 meq/L (ref 19–32)
Calcium: 8.9 mg/dL (ref 8.4–10.5)
Chloride: 93 mEq/L — ABNORMAL LOW (ref 96–112)
Creatinine, Ser: 0.91 mg/dL (ref 0.50–1.35)
GFR calc Af Amer: >90 mL/min (ref >90)
GFR, EST NON AFRICAN AMERICAN: 90 mL/min — AB (ref >90)
Glucose, Bld: 115 mg/dL — ABNORMAL HIGH (ref 70–99)
POTASSIUM: 4.8 meq/L (ref 3.7–5.3)
SODIUM: 133 meq/L — AB (ref 137–147)

## 2013-06-21 LAB — RAPID STREP SCREEN (MED CTR MEBANE ONLY): Streptococcus, Group A Screen (Direct): NEGATIVE

## 2013-06-21 LAB — PRO B NATRIURETIC PEPTIDE: PRO B NATRI PEPTIDE: 2813 pg/mL — AB (ref 0–125)

## 2013-06-21 LAB — CREATININE, SERUM
Creatinine, Ser: 1.44 mg/dL — ABNORMAL HIGH (ref 0.50–1.35)
GFR calc Af Amer: 59 mL/min — ABNORMAL LOW (ref >90)
GFR calc non Af Amer: 51 mL/min — ABNORMAL LOW (ref >90)

## 2013-06-21 LAB — CG4 I-STAT (LACTIC ACID): Lactic Acid, Venous: 1.22 mmol/L (ref 0.5–2.2)

## 2013-06-21 LAB — TROPONIN I
Troponin I: <0.3 ng/mL (ref <0.30)
Troponin I: <0.3 ng/mL (ref <0.30)
Troponin I: <0.3 ng/mL (ref <0.30)

## 2013-06-21 MED ORDER — ONDANSETRON HCL 4 MG/2ML IJ SOLN
4.0000 mg | Freq: Once | INTRAMUSCULAR | Status: AC
  Administered 2013-06-21: 4 mg via INTRAVENOUS
  Filled 2013-06-21: qty 2

## 2013-06-21 MED ORDER — ALBUTEROL SULFATE (2.5 MG/3ML) 0.083% IN NEBU
5.0000 mg | INHALATION_SOLUTION | Freq: Once | RESPIRATORY_TRACT | Status: AC
  Administered 2013-06-21: 5 mg via RESPIRATORY_TRACT
  Filled 2013-06-21: qty 6

## 2013-06-21 MED ORDER — OSELTAMIVIR PHOSPHATE 75 MG PO CAPS
75.0000 mg | ORAL_CAPSULE | Freq: Two times a day (BID) | ORAL | Status: DC
  Administered 2013-06-21 (×2): 75 mg via ORAL
  Filled 2013-06-21 (×6): qty 1

## 2013-06-21 MED ORDER — ALUM & MAG HYDROXIDE-SIMETH 200-200-20 MG/5ML PO SUSP: 30.0000 mL | Freq: Four times a day (QID) | ORAL | Status: DC | PRN

## 2013-06-21 MED ORDER — GUAIFENESIN-DM 100-10 MG/5ML PO SYRP
5.0000 mL | ORAL_SOLUTION | ORAL | Status: DC | PRN
  Administered 2013-06-21 – 2013-06-23 (×2): 5 mL via ORAL
  Filled 2013-06-21 (×2): qty 10

## 2013-06-21 MED ORDER — SODIUM CHLORIDE 0.9 % IV BOLUS (SEPSIS)
500.0000 mL | Freq: Once | INTRAVENOUS | Status: AC
  Administered 2013-06-21: 500 mL via INTRAVENOUS

## 2013-06-21 MED ORDER — ALPRAZOLAM 1 MG PO TABS
1.0000 mg | ORAL_TABLET | Freq: Three times a day (TID) | ORAL | Status: DC
  Administered 2013-06-21 – 2013-06-23 (×6): 1 mg via ORAL
  Filled 2013-06-21 (×6): qty 1

## 2013-06-21 MED ORDER — METHYLPREDNISOLONE SODIUM SUCC 125 MG IJ SOLR
60.0000 mg | Freq: Two times a day (BID) | INTRAMUSCULAR | Status: DC
  Administered 2013-06-21 – 2013-06-23 (×4): 60 mg via INTRAVENOUS
  Filled 2013-06-21 (×8): qty 0.96

## 2013-06-21 MED ORDER — SODIUM CHLORIDE 0.9 % IV SOLN: 250.0000 mL | INTRAVENOUS | Status: DC | PRN

## 2013-06-21 MED ORDER — ALLOPURINOL 300 MG PO TABS
300.0000 mg | ORAL_TABLET | Freq: Every day | ORAL | Status: DC
  Administered 2013-06-22 – 2013-06-23 (×2): 300 mg via ORAL
  Filled 2013-06-21 (×2): qty 1

## 2013-06-21 MED ORDER — SODIUM CHLORIDE 0.9 % IJ SOLN
3.0000 mL | Freq: Two times a day (BID) | INTRAMUSCULAR | Status: DC
  Administered 2013-06-21 – 2013-06-22 (×3): 3 mL via INTRAVENOUS

## 2013-06-21 MED ORDER — ONDANSETRON HCL 4 MG/2ML IJ SOLN: 4.0000 mg | Freq: Four times a day (QID) | INTRAMUSCULAR | Status: DC | PRN

## 2013-06-21 MED ORDER — LISINOPRIL 20 MG PO TABS
20.0000 mg | ORAL_TABLET | Freq: Every day | ORAL | Status: DC
  Administered 2013-06-22: 20 mg via ORAL
  Filled 2013-06-21: qty 1

## 2013-06-21 MED ORDER — ASPIRIN EC 81 MG PO TBEC
81.0000 mg | DELAYED_RELEASE_TABLET | Freq: Every day | ORAL | Status: DC
  Administered 2013-06-22 – 2013-06-23 (×2): 81 mg via ORAL
  Filled 2013-06-21 (×2): qty 1

## 2013-06-21 MED ORDER — CITALOPRAM HYDROBROMIDE 40 MG PO TABS
40.0000 mg | ORAL_TABLET | Freq: Every day | ORAL | Status: DC
  Administered 2013-06-21 – 2013-06-22 (×2): 40 mg via ORAL
  Filled 2013-06-21 (×3): qty 1

## 2013-06-21 MED ORDER — MORPHINE SULFATE 4 MG/ML IJ SOLN
4.0000 mg | Freq: Once | INTRAMUSCULAR | Status: AC
  Administered 2013-06-21: 4 mg via INTRAVENOUS
  Filled 2013-06-21: qty 1

## 2013-06-21 MED ORDER — LEVOFLOXACIN 500 MG PO TABS
500.0000 mg | ORAL_TABLET | Freq: Every day | ORAL | Status: DC
  Administered 2013-06-21 – 2013-06-23 (×3): 500 mg via ORAL
  Filled 2013-06-21 (×4): qty 1

## 2013-06-21 MED ORDER — SODIUM CHLORIDE 0.9 % IJ SOLN: 3.0000 mL | INTRAMUSCULAR | Status: DC | PRN

## 2013-06-21 MED ORDER — SODIUM CHLORIDE 0.9 % IJ SOLN: 3.0000 mL | Freq: Two times a day (BID) | INTRAMUSCULAR | Status: DC

## 2013-06-21 MED ORDER — VITAMIN D3 25 MCG (1000 UNIT) PO TABS
5000.0000 [IU] | ORAL_TABLET | Freq: Every day | ORAL | Status: DC
  Administered 2013-06-22 – 2013-06-23 (×2): 5000 [IU] via ORAL
  Filled 2013-06-21 (×2): qty 5

## 2013-06-21 MED ORDER — ATENOLOL 100 MG PO TABS
100.0000 mg | ORAL_TABLET | Freq: Every day | ORAL | Status: DC
  Administered 2013-06-22 – 2013-06-23 (×2): 100 mg via ORAL
  Filled 2013-06-21 (×2): qty 1

## 2013-06-21 MED ORDER — ONDANSETRON HCL 4 MG PO TABS: 4.0000 mg | ORAL_TABLET | Freq: Four times a day (QID) | ORAL | Status: DC | PRN

## 2013-06-21 MED ORDER — IPRATROPIUM-ALBUTEROL 0.5-2.5 (3) MG/3ML IN SOLN
3.0000 mL | Freq: Three times a day (TID) | RESPIRATORY_TRACT | Status: DC
  Administered 2013-06-22 – 2013-06-23 (×3): 3 mL via RESPIRATORY_TRACT
  Filled 2013-06-21 (×5): qty 3

## 2013-06-21 MED ORDER — ENOXAPARIN SODIUM 40 MG/0.4ML ~~LOC~~ SOLN
40.0000 mg | Freq: Every day | SUBCUTANEOUS | Status: DC
  Administered 2013-06-21 – 2013-06-23 (×3): 40 mg via SUBCUTANEOUS
  Filled 2013-06-21 (×4): qty 0.4

## 2013-06-21 MED ORDER — ADULT MULTIVITAMIN W/MINERALS CH
1.0000 | ORAL_TABLET | Freq: Every day | ORAL | Status: DC
  Administered 2013-06-22 – 2013-06-23 (×2): 1 via ORAL
  Filled 2013-06-21 (×4): qty 1

## 2013-06-21 MED ORDER — KETOROLAC TROMETHAMINE 30 MG/ML IJ SOLN: 30.0000 mg | Freq: Four times a day (QID) | INTRAMUSCULAR | Status: DC | PRN

## 2013-06-21 MED ORDER — IPRATROPIUM-ALBUTEROL 0.5-2.5 (3) MG/3ML IN SOLN
3.0000 mL | Freq: Four times a day (QID) | RESPIRATORY_TRACT | Status: DC
  Administered 2013-06-21 (×2): 3 mL via RESPIRATORY_TRACT
  Filled 2013-06-21 (×2): qty 3

## 2013-06-21 NOTE — ED Notes (Signed)
Per EMS, pt from home with cough, fever, Shortness of breath, n/v since Saturday.  Vitals:  159/117, hr 73, resp, 18. 99% 2l [place]  Pt does have HTN and has not had meds this morning.

## 2013-06-21 NOTE — ED Notes (Signed)
Patient transported to X-ray 

## 2013-06-21 NOTE — H&P (Signed)
[facility] History and Physical  Jonathan Wilson:[id number] DOB: [birth year] DOA: 06/21/2013  Referring physician: Dr. Alfonzo Beers PCP: Aurora Mask, NP   Chief Complaint: Shortness of breath, cough, fever   History of Present Illness: Jonathan Wilson is an 63 y.o. male with a PMH of GERD, tobacco abuse (but no history of heart disease), mild COPD and recurrent PNA, hospitalized 2007 for PNA, who presents with a 1 week history of fever, myalgia, cough, sore throat and progressive dyspnea.  He did not receive an influenza vaccination this year, and thinks his wife recently had a URI, but reports that his illness was worse than hers, and that he "hasn't been able to get out of bed" for the past 2 days.  He feels very weak and fatigued, and has a poor appetite.  He has had pleuritic chest pain and tightness.  Had an episode of nausea and vomiting this morning.  No aggravating or alleviating factors.  Upon initial evaluation in the ED, the patient had a CXR which did not show any evidence of PNA, but was hypoxic on room air.  Review of Systems: Constitutional: + fever, + chills;  Appetite diminished; No weight loss, no weight gain, + fatigue.  HEENT: No blurry vision, no diplopia, + pharyngitis, no dysphagia CV: No chest pain, no palpitations, no PND.  Resp: + SOB, + cough, + pleuritic pain. GI: + nausea, + vomiting, no diarrhea, no melena, no hematochezia, no constipation.  GU: No dysuria, no hematuria, no frequency, no urgency. MSK: + myalgias, no arthralgias.  Neuro:  No headache, no focal neurological deficits, no history of seizures.  Psych: No depression, no anxiety.  Endo: No heat intolerance, no cold intolerance, no polyuria, no polydipsia  Skin: No rashes, no skin lesions.  Heme: No easy bruising.   Past Medical History Past Medical History  Diagnosis Date  . Pneumonia   . Hypertension   . Hyperlipidemia   . Gout   . Vitamin D deficiency   . Hepatitis C   . Arthritis       Past Surgical History Past Surgical History  Procedure Laterality Date  . Mandible fracture surgery       Social History: History   Social History  . Marital Status: Legally Separated    Spouse Name: Marlowe Kays    Number of Children: 3  . Years of Education: N/A   Occupational History  . Employed: Architect work    Social History Main Topics  . Smoking status: Current Every Day Smoker -- 1.50 packs/day for 40 years    Types: Cigarettes  . Smokeless tobacco: Former Systems developer    Quit date: 06/06/2013  . Alcohol Use: No  . Drug Use: No  . Sexual Activity: No   Other Topics Concern  . Not on file   Social History Narrative   Married.  Independent of ADLs.    Family History:  Family History  Problem Relation Age of Onset  . Diabetes Mother   . Arthritis Mother   . Cancer Father     pancreatic cancer    Allergies: Tylenol  Meds: Prior to Admission medications   Medication Sig Start Date End Date Taking? Authorizing Provider  allopurinol (ZYLOPRIM) 300 MG tablet Take 300 mg by mouth daily.   Yes Historical Provider, MD  ALPRAZolam Duanne Moron) 1 MG tablet Take 1 mg by mouth 3 (three) times daily.   Yes Historical Provider, MD  aspirin EC 81 MG tablet Take 81 mg by mouth daily.  Yes Historical Provider, MD  atenolol (TENORMIN) 100 MG tablet TAKE ONE TABLET BY MOUTH ONCE DAILY FOR BLOOD PRESSURE 04/13/13  Yes Vicie Mutters, PA-C  cholecalciferol (VITAMIN D) 1000 UNITS tablet Take 5,000 Units by mouth daily.   Yes Historical Provider, MD  citalopram (CELEXA) 40 MG tablet Take 1 tablet (40 mg total) by mouth daily. 05/18/13  Yes Vicie Mutters, PA-C  Cyanocobalamin (VITAMIN B-12 CR PO) Take 1 tablet by mouth daily.   Yes Historical Provider, MD  lisinopril (PRINIVIL,ZESTRIL) 20 MG tablet TAKE ONE TABLET BY MOUTH TWICE DAILY 06/15/13  Yes Unk Pinto, MD  Multiple Vitamin (MULTIVITAMIN WITH MINERALS) TABS tablet Take 1 tablet by mouth daily.   Yes Historical Provider,  MD    Physical Exam: Filed Vitals:   06/21/13 1130 06/21/13 1144 06/21/13 1200 06/21/13 1212  BP: 127/75  141/70   Pulse: 69  83   Temp:      TempSrc:      Resp: 30  35   SpO2: 90% 92% 91% 95%     Physical Exam: Blood pressure 141/70, pulse 83, temperature 97.8 F (36.6 C), temperature source Oral, resp. rate 35, SpO2 95.00%. Gen: No acute distress. Head: Normocephalic, atraumatic. Eyes: PERRL, EOMI, sclerae nonicteric. Mouth: Oropharynx with posterior pharyngeal erythema and exudates. Neck: Supple, no thyromegaly, no lymphadenopathy, no jugular venous distention. Chest: Lungs decreased throughout with some high pitched wheezes on the right. CV: Heart sounds are regular.  No murmur, rubs or gallops. Abdomen: Soft, nontender, nondistended with normal active bowel sounds. Extremities: Extremities are without C/E/C. Skin: Warm and dry. Neuro: Alert and oriented times 3; cranial nerves II through XII grossly intact. Psych: Mood and affect normal.  Labs on Admission:  Basic Metabolic Panel:  Recent Labs Lab 06/21/13 0908  NA 133*  K 4.8  CL 93*  CO2 24  GLUCOSE 115*  BUN 19  CREATININE 0.91  CALCIUM 8.9   CBC:  Recent Labs Lab 06/21/13 0908  WBC 6.0  HGB 16.1  HCT 44.9  MCV 94.1  PLT 119*   Cardiac Enzymes:  Recent Labs Lab 06/21/13 0908  TROPONINI <0.30    BNP (last 3 results)  Recent Labs  06/21/13 0908  PROBNP 2813.0*   CBG: No results found for this basename: GLUCAP,  in the last 168 hours  Radiological Exams on Admission: Dg Chest 2 View  06/21/2013   CLINICAL DATA:  Shortness of breath.  Cough.  Fever.  Chest pain.  EXAM: CHEST  2 VIEW  COMPARISON:  02/22/2013  FINDINGS: The heart size and mediastinal contours are within normal limits. Both lungs are clear. No evidence of pleural effusion. No mass or lymphadenopathy identified. Thoracic spine degenerative changes again noted  IMPRESSION: No active cardiopulmonary disease.   Electronically  Signed   By: Earle Gell M.D.   On: 06/21/2013 09:15    EKG: Independently reviewed. Sinus rhythm; Incomplete RBBB and LAFB, ? right ventricular hypertrophy. No significant change since last tracing  Assessment/Plan Principal Problem:   Acute respiratory failure with hypoxia / shortness of breath Multifactorial with COPD exacerbation from URI, ? CHF contributory.  Treat COPD exacerbation and evaluate for possible CHF.  Provide supplemental oxygen. Active Problems:   GOUT Continue Allopurinol.   TOBACCO ABUSE Reports he quit smoking two weeks ago.  60 pack year history.   Hypertension Continue Atenolol and lisinopril.   Vitamin D deficiency Continue vitamin D supplement.   Elevated brain natriuretic peptide (BNP) level No known history of CHF.  No pulmonary edema noted on CXR.  Check 2 D Echo.   Influenza-like illness Will start empiric Tamiflu while awaiting respiratory pathogen panel.  R/O strep throat given posterior pharyngeal exudates and start empiric Levaquin while awaiting rapid strep screen.  Toradol for myalgias.   COPD with exacerbation Will treat with supplemental oxygen, nebulized bronchodilator treatments, Solu-Medrol, and empiric tamiflu and Levaquin.     Abnormal EKG: incomplete RBBB and LAFB Cycle troponins.  Check 2 D Echo.  Continue ASA.  No prior cardiac history.  Code Status: Full. Family Communication: Wife, Marlowe Kays, identified as emergency contact. Disposition Plan: Home when stable.  Time spent: 70 minutes.  Hughey Rittenberry [facility] Pager [phone]  If 7PM-7AM, please contact night-coverage [email or web address] Password [facility] 06/21/2013, 12:48 PM

## 2013-06-21 NOTE — ED Provider Notes (Signed)
CSN: [id number]     Arrival date & time 06/21/13  7782 History   First MD Initiated Contact with Patient 06/21/13 0754     Chief Complaint  Patient presents with  . Shortness of Breath   (Consider location/radiation/quality/duration/timing/severity/associated sxs/prior Treatment) HPI Pt presents with cough fever, generalized weakness and body aches over the past 2 days. Pt states his illness began suddenly and he has been lying in bed for 2 days.  Subjective fever/chills.  Feels short of breath and very weak.  No vomiting or diarrhea.  He has been drinking less liquids due to weakness.  Cough is nonproductive.  No sick contacts but wife is currently in hospital.  Pt has been a smoker in the past- stopped 2 weeks ago- no hx of copd or heart problems that he knows of.  There are no other associated systemic symptoms, there are no other alleviating or modifying factors.   Past Medical History  Diagnosis Date  . Pneumonia   . Hypertension   . Hyperlipidemia   . Gout   . Vitamin D deficiency   . Hepatitis C   . Arthritis    Past Surgical History  Procedure Laterality Date  . Mandible fracture surgery     Family History  Problem Relation Age of Onset  . Diabetes Mother   . Arthritis Mother   . Cancer Father     pancreatic cancer   History  Substance Use Topics  . Smoking status: Current Every Day Smoker -- 1.50 packs/day for 40 years    Types: Cigarettes  . Smokeless tobacco: Former Systems developer    Quit date: 06/06/2013  . Alcohol Use: No    Review of Systems ROS reviewed and all otherwise negative except for mentioned in HPI  Allergies  Tylenol  Home Medications   No current outpatient prescriptions on file. BP 110/67  Pulse 51  Temp(Src) 97.9 F (36.6 C) (Oral)  Resp 21  Ht 6\' 4"  (1.93 m)  Wt 271 lb 9.7 oz (123.2 kg)  BMI 33.07 kg/m2  SpO2 93% Vitals reviewed Physical Exam Physical Examination: General appearance - alert, well appearing, and in no distress Mental  status - alert, oriented to person, place, and time Eyes - no conjunctival injection, no scleral icterus Mouth - mucous membranes moist, pharynx normal without lesions Neck - supple, no significant adenopathy Chest - clear to auscultation, no wheezes, rales or rhonchi, symmetric air entry, some increased respiratory effort Heart - normal rate, regular rhythm, normal S1, S2, no murmurs, rubs, clicks or gallops Abdomen - soft, nontender, nondistended, no masses or organomegaly Extremities - peripheral pulses normal, no pedal edema, no clubbing or cyanosis Skin - normal coloration and turgor, no rashes  ED Course  Procedures (including critical care time) Labs Review Labs Reviewed  RESPIRATORY VIRUS PANEL - Abnormal; Notable for the following:    Influenza A DETECTED (*)    Influenza A H3 DETECTED (*)    All other components within normal limits  CBC - Abnormal; Notable for the following:    Platelets 119 (*)    All other components within normal limits  BASIC METABOLIC PANEL - Abnormal; Notable for the following:    Sodium 133 (*)    Chloride 93 (*)    Glucose, Bld 115 (*)    GFR calc non Af Amer 90 (*)    All other components within normal limits  PRO B NATRIURETIC PEPTIDE - Abnormal; Notable for the following:    Pro B Natriuretic peptide (  BNP) 2813.0 (*)    All other components within normal limits  HEPATIC FUNCTION PANEL - Abnormal; Notable for the following:    AST 76 (*)    All other components within normal limits  CBC - Abnormal; Notable for the following:    Platelets 121 (*)    All other components within normal limits  CREATININE, SERUM - Abnormal; Notable for the following:    Creatinine, Ser 1.44 (*)    GFR calc non Af Amer 51 (*)    GFR calc Af Amer 59 (*)    All other components within normal limits  BASIC METABOLIC PANEL - Abnormal; Notable for the following:    Sodium 133 (*)    Chloride 90 (*)    Glucose, Bld 128 (*)    BUN 41 (*)    Creatinine, Ser 2.61  (*)    GFR calc non Af Amer 25 (*)    GFR calc Af Amer 29 (*)    All other components within normal limits  CBC - Abnormal; Notable for the following:    MCH 34.1 (*)    Platelets 121 (*)    All other components within normal limits  URINALYSIS W MICROSCOPIC + REFLEX CULTURE - Abnormal; Notable for the following:    APPearance CLOUDY (*)    Bacteria, UA FEW (*)    Casts HYALINE CASTS (*)    All other components within normal limits  INFLUENZA PANEL BY PCR (TYPE A & B, H1N1) - Abnormal; Notable for the following:    Influenza A By PCR POSITIVE (*)    All other components within normal limits  RAPID STREP SCREEN  CULTURE, GROUP A STREP  CULTURE, EXPECTORATED SPUTUM-ASSESSMENT  URINE CULTURE  TROPONIN I  TROPONIN I  TROPONIN I  TROPONIN I  OSMOLALITY  CREATININE, URINE, RANDOM  SODIUM, URINE, RANDOM  OSMOLALITY, URINE  BASIC METABOLIC PANEL  CG4 I-STAT (LACTIC ACID)   Imaging Review Dg Chest 2 View  06/21/2013   CLINICAL DATA:  Shortness of breath.  Cough.  Fever.  Chest pain.  EXAM: CHEST  2 VIEW  COMPARISON:  02/22/2013  FINDINGS: The heart size and mediastinal contours are within normal limits. Both lungs are clear. No evidence of pleural effusion. No mass or lymphadenopathy identified. Thoracic spine degenerative changes again noted  IMPRESSION: No active cardiopulmonary disease.   Electronically Signed   By: Earle Gell M.D.   On: 06/21/2013 09:15    EKG Interpretation    Date/Time:  Monday June 21 2013 07:58:19 EST Ventricular Rate:  75 PR Interval:  143 QRS Duration: 105 QT Interval:  399 QTC Calculation: 446 R Axis:   -39 Text Interpretation:  Sinus rhythm Incomplete RBBB and LAFB Consider right ventricular hypertrophy No significant change since last tracing Confirmed by [facility]  MD, Julion Gatt ([id number]) on 06/21/2013 8:18:24 AM            MDM   1. Shortness of breath 2. Hypoxia 3. Influenza like illness 4. Renal insufficiency  Pt presenting with general  malaise, shortness of breath, fever.  Workup in the ED reveals no pneumonia, no signs of fluid overload on CXR.  BNP elevated of uncertain significant.  Flu screen sent and pending at time of admission.   This is the 3rd day of illness.  Pt with improvement in hypoxia on [place] O2.  Pt admitted to telemetry bed to hospitalist service.     Threasa Beards, MD 06/22/13 [phone]

## 2013-06-22 ENCOUNTER — Inpatient Hospital Stay (HOSPITAL_COMMUNITY): Payer: Self-pay

## 2013-06-22 DIAGNOSIS — I517 Cardiomegaly: Secondary | ICD-10-CM

## 2013-06-22 LAB — RESPIRATORY VIRUS PANEL
Adenovirus: NOT DETECTED
INFLUENZA A H1: NOT DETECTED
Influenza A H3: DETECTED — AB
Influenza A: DETECTED — AB
Influenza B: NOT DETECTED
Metapneumovirus: NOT DETECTED
PARAINFLUENZA 2 A: NOT DETECTED
Parainfluenza 1: NOT DETECTED
Parainfluenza 3: NOT DETECTED
RESPIRATORY SYNCYTIAL VIRUS B: NOT DETECTED
RHINOVIRUS: NOT DETECTED
Respiratory Syncytial Virus A: NOT DETECTED

## 2013-06-22 LAB — BASIC METABOLIC PANEL
BUN: 41 mg/dL — ABNORMAL HIGH (ref 6–23)
CALCIUM: 9 mg/dL (ref 8.4–10.5)
CHLORIDE: 90 meq/L — AB (ref 96–112)
CO2: 27 mEq/L (ref 19–32)
CREATININE: 2.61 mg/dL — AB (ref 0.50–1.35)
GFR calc Af Amer: 29 mL/min — ABNORMAL LOW (ref >90)
GFR calc non Af Amer: 25 mL/min — ABNORMAL LOW (ref >90)
GLUCOSE: 128 mg/dL — AB (ref 70–99)
Potassium: 4.8 mEq/L (ref 3.7–5.3)
Sodium: 133 mEq/L — ABNORMAL LOW (ref 137–147)

## 2013-06-22 LAB — OSMOLALITY: Osmolality: 300 mOsm/kg (ref 275–300)

## 2013-06-22 LAB — CBC
HEMATOCRIT: 41.9 % (ref 39.0–52.0)
Hemoglobin: 14.7 g/dL (ref 13.0–17.0)
MCH: 34.1 pg — AB (ref 26.0–34.0)
MCHC: 35.1 g/dL (ref 30.0–36.0)
MCV: 97.2 fL (ref 78.0–100.0)
PLATELETS: 121 10*3/uL — AB (ref 150–400)
RBC: 4.31 MIL/uL (ref 4.22–5.81)
RDW: 12.4 % (ref 11.5–15.5)
WBC: 5.1 10*3/uL (ref 4.0–10.5)

## 2013-06-22 LAB — URINALYSIS W MICROSCOPIC + REFLEX CULTURE
Bilirubin Urine: NEGATIVE
Glucose, UA: NEGATIVE mg/dL
Hgb urine dipstick: NEGATIVE
Ketones, ur: NEGATIVE mg/dL
LEUKOCYTES UA: NEGATIVE
Nitrite: NEGATIVE
PH: 5 (ref 5.0–8.0)
Protein, ur: NEGATIVE mg/dL
Specific Gravity, Urine: 1.019 (ref 1.005–1.030)
Urobilinogen, UA: 1 mg/dL (ref 0.0–1.0)

## 2013-06-22 LAB — SODIUM, URINE, RANDOM: Sodium, Ur: 63 mEq/L

## 2013-06-22 LAB — OSMOLALITY, URINE: Osmolality, Ur: 543 mOsm/kg (ref 390–1090)

## 2013-06-22 LAB — TROPONIN I: Troponin I: <0.3 ng/mL (ref <0.30)

## 2013-06-22 LAB — INFLUENZA PANEL BY PCR (TYPE A & B)
H1N1FLUPCR: NOT DETECTED
Influenza A By PCR: POSITIVE — AB
Influenza B By PCR: NEGATIVE

## 2013-06-22 LAB — CREATININE, URINE, RANDOM: Creatinine, Urine: 212.5 mg/dL

## 2013-06-22 MED ORDER — METOCLOPRAMIDE HCL 5 MG/ML IJ SOLN
10.0000 mg | Freq: Once | INTRAMUSCULAR | Status: AC
  Administered 2013-06-22: 10 mg via INTRAVENOUS
  Filled 2013-06-22: qty 2

## 2013-06-22 MED ORDER — KETOROLAC TROMETHAMINE 30 MG/ML IJ SOLN
15.0000 mg | Freq: Once | INTRAMUSCULAR | Status: AC
  Administered 2013-06-22: 15 mg via INTRAVENOUS
  Filled 2013-06-22: qty 1

## 2013-06-22 MED ORDER — SODIUM CHLORIDE 0.9 % IV SOLN: 250.0000 mL | INTRAVENOUS | Status: AC | PRN

## 2013-06-22 MED ORDER — OSELTAMIVIR PHOSPHATE 75 MG PO CAPS
75.0000 mg | ORAL_CAPSULE | Freq: Every day | ORAL | Status: DC
  Administered 2013-06-22 – 2013-06-23 (×2): 75 mg via ORAL
  Filled 2013-06-22 (×3): qty 1

## 2013-06-22 MED ORDER — DIPHENHYDRAMINE HCL 50 MG/ML IJ SOLN
25.0000 mg | Freq: Once | INTRAMUSCULAR | Status: AC
  Administered 2013-06-22: 25 mg via INTRAVENOUS
  Filled 2013-06-22: qty 1

## 2013-06-22 MED ORDER — ACETAMINOPHEN 500 MG PO TABS
500.0000 mg | ORAL_TABLET | Freq: Once | ORAL | Status: AC
  Administered 2013-06-22: 500 mg via ORAL
  Filled 2013-06-22: qty 1

## 2013-06-22 MED ORDER — CITALOPRAM HYDROBROMIDE 20 MG PO TABS
20.0000 mg | ORAL_TABLET | Freq: Every day | ORAL | Status: DC
  Administered 2013-06-23: 20 mg via ORAL
  Filled 2013-06-22: qty 1

## 2013-06-22 NOTE — Progress Notes (Signed)
Echocardiogram 2D Echocardiogram has been performed.  Joelene Millin 06/22/2013, 12:39 PM

## 2013-06-22 NOTE — Progress Notes (Addendum)
Patient Demographics  Jonathan Wilson, is a 63 y.o. male, DOB - 1950-08-28, EGB:[id number]  Admit date - 06/21/2013   Admitting Physician Venetia Maxon Rama, MD  Outpatient Primary MD for the patient is [place], NP  LOS - 1   Chief Complaint  Patient presents with  . Shortness of Breath        Assessment & Plan    Acute respiratory failure with hypoxia / shortness of breath, mild COPD exacerbation along with possible influenza-like illness   Continue treatment for COPD exacerbation with IV steroids, Levaquin, oxygen supplementation and nebulizer treatments. He is feeling better  Suspected influenza, have ordered influenza PCR, for now continue Tamiflu.    Acute renal failure.  He does not have any CHF clinically, he has no edema or CHF, in fact appears dehydrated, he has developed acute renal failure, will stop lisinopril, obtain urine electrolytes and renal ultrasound, IV fluids, repeat BMP in the morning.     GOUT  Continue Allopurinol. No acute issues     TOBACCO ABUSE  Reports he quit smoking two weeks ago. 60 pack year history. Counseled to continue abstaining from smoking.    Hypertension  Continue Atenolol, stopped lisinopril due to acute renal failure.     Vitamin D deficiency  Continue vitamin D supplement.     Elevated brain natriuretic peptide (BNP) level  No known history of CHF or in my exam. No pulmonary edema noted on CXR. Check 2 D Echo.      Abnormal EKG: incomplete RBBB and LAFB  Negative Cycled troponins. Check 2 D Echo. Continue ASA and beta blocker. No prior cardiac history.      Code Status:    Family Communication:    Disposition Plan: Home   Procedures  Echo, renal ultrasound   Consults      Medications  Scheduled  Meds: . allopurinol  300 mg Oral Daily  . ALPRAZolam  1 mg Oral TID  . aspirin EC  81 mg Oral Daily  . atenolol  100 mg Oral Daily  . cholecalciferol  5,000 Units Oral Daily  . citalopram  40 mg Oral Daily  . enoxaparin (LOVENOX) injection  40 mg Subcutaneous Daily  . ipratropium-albuterol  3 mL Nebulization TID  . levofloxacin  500 mg Oral Daily  . methylPREDNISolone (SOLU-MEDROL) injection  60 mg Intravenous Q12H  . multivitamin with minerals  1 tablet Oral Daily  . oseltamivir  75 mg Oral Daily  . sodium chloride  3 mL Intravenous Q12H  . sodium chloride  3 mL Intravenous Q12H   Continuous Infusions:  PRN Meds:.sodium chloride, alum & mag hydroxide-simeth, guaiFENesin-dextromethorphan, ondansetron (ZOFRAN) IV, ondansetron, sodium chloride  DVT Prophylaxis  Lovenox   Lab Results  Component Value Date   PLT 121* 06/22/2013    Antibiotics    Anti-infectives   Start     Dose/Rate Route Frequency Ordered Stop   06/22/13 1000  oseltamivir (TAMIFLU) capsule 75 mg     75 mg Oral Daily 06/22/13 0725     06/21/13 1400  oseltamivir (TAMIFLU) capsule 75 mg  Status:  Discontinued     75 mg Oral 2 times daily 06/21/13 1320 06/22/13 0725   06/21/13 1400  levofloxacin (LEVAQUIN) tablet 500 mg  500 mg Oral Daily 06/21/13 1320            Subjective:   Jonathan Wilson today has, No headache, mild post cough chest pain, No abdominal pain - No Nausea, No new weakness tingling or numbness, ++ Cough improved SOB.   Objective:   Filed Vitals:   06/21/13 1320 06/21/13 1517 06/21/13 2026 06/22/13 0435  BP: 107/55  117/54 110/73  Pulse: 91  78 63  Temp: 98.1 F (36.7 C)  98.5 F (36.9 C) 97.3 F (36.3 C)  TempSrc: Oral  Oral Oral  Resp: 20  20 22   Height: 6\' 4"  (1.93 m)     Weight: 123.2 kg (271 lb 9.7 oz)     SpO2: 92% 94% 92% 93%    Wt Readings from Last 3 Encounters:  06/21/13 123.2 kg (271 lb 9.7 oz)  04/19/13 121.473 kg (267 lb 12.8 oz)  07/17/10 124.059 kg (273 lb 8 oz)      Intake/Output Summary (Last 24 hours) at 06/22/13 1214 Last data filed at 06/22/13 0630  Gross per 24 hour  Intake    480 ml  Output      4 ml  Net    476 ml    Exam Awake Alert, Oriented X 3, No new F.N deficits, Normal affect [place].AT,PERRAL Supple Neck,No JVD, No cervical lymphadenopathy appriciated.  Symmetrical Chest wall movement, Good air movement bilaterally, Coarse B sounds RRR,No Gallops,Rubs or new Murmurs, No Parasternal Heave +ve B.Sounds, Abd Soft, Non tender, No organomegaly appriciated, No rebound - guarding or rigidity. No Cyanosis, Clubbing , No  edema, No new Rash or bruise      Data Review   Micro Results Recent Results (from the past 240 hour(s))  RAPID STREP SCREEN     Status: None   Collection Time    06/21/13 12:48 PM      Result Value Range Status   Streptococcus, Group A Screen (Direct) NEGATIVE  NEGATIVE Final   Comment: (NOTE)     A Rapid Antigen test may result negative if the antigen level in the     sample is below the detection level of this test. The FDA has not     cleared this test as a stand-alone test therefore the rapid antigen     negative result has reflexed to a Group A Strep culture.  CULTURE, GROUP A STREP     Status: None   Collection Time    06/21/13 12:48 PM      Result Value Range Status   Specimen Description THROAT   Final   Special Requests NONE   Final   Culture     Final   Value: NO SUSPICIOUS COLONIES, CONTINUING TO HOLD     Performed at [facility]   Report Status PENDING   Incomplete    Radiology Reports Dg Chest 2 View  06/21/2013   CLINICAL DATA:  Shortness of breath.  Cough.  Fever.  Chest pain.  EXAM: CHEST  2 VIEW  COMPARISON:  02/22/2013  FINDINGS: The heart size and mediastinal contours are within normal limits. Both lungs are clear. No evidence of pleural effusion. No mass or lymphadenopathy identified. Thoracic spine degenerative changes again noted  IMPRESSION: No active cardiopulmonary  disease.   Electronically Signed   By: Myles RosenthalJohn  Stahl M.D.   On: 06/21/2013 09:15    CBC  Recent Labs Lab 06/21/13 0908 06/21/13 1355 06/22/13 0151  WBC 6.0 6.3 5.1  HGB 16.1 15.2 14.7  HCT 44.9 44.2 41.9  PLT 119* 121* 121*  MCV 94.1 97.4 97.2  MCH 33.8 33.5 34.1*  MCHC 35.9 34.4 35.1  RDW 12.0 12.2 12.4    Chemistries   Recent Labs Lab 06/21/13 0908 06/21/13 1355 06/22/13 0151  NA 133*  --  133*  K 4.8  --  4.8  CL 93*  --  90*  CO2 24  --  27  GLUCOSE 115*  --  128*  BUN 19  --  41*  CREATININE 0.91 1.44* 2.61*  CALCIUM 8.9  --  9.0  AST 76*  --   --   ALT 32  --   --   ALKPHOS 74  --   --   BILITOT 0.7  --   --    ------------------------------------------------------------------------------------------------------------------ estimated creatinine clearance is 42.6 ml/min (by C-G formula based on Cr of 2.61). ------------------------------------------------------------------------------------------------------------------ No results found for this basename: HGBA1C,  in the last 72 hours ------------------------------------------------------------------------------------------------------------------ No results found for this basename: CHOL, HDL, LDLCALC, TRIG, CHOLHDL, LDLDIRECT,  in the last 72 hours ------------------------------------------------------------------------------------------------------------------ No results found for this basename: TSH, T4TOTAL, FREET3, T3FREE, THYROIDAB,  in the last 72 hours ------------------------------------------------------------------------------------------------------------------ No results found for this basename: VITAMINB12, FOLATE, FERRITIN, TIBC, IRON, RETICCTPCT,  in the last 72 hours  Coagulation profile No results found for this basename: INR, PROTIME,  in the last 168 hours  No results found for this basename: DDIMER,  in the last 72 hours  Cardiac Enzymes  Recent Labs Lab 06/21/13 1355 06/21/13 1907  06/22/13 0151  TROPONINI <0.30 <0.30 <0.30   ------------------------------------------------------------------------------------------------------------------ No components found with this basename: POCBNP,      Time Spent in minutes   35   Katerina Zurn K M.D on 06/22/2013 at 12:14 PM  Between 7am to 7pm - Pager - [phone]  After 7pm go to [email or web address] - password [email or web address]  And look for the night coverage person covering for me after hours  [facility] Office  [phone]

## 2013-06-23 DIAGNOSIS — E559 Vitamin D deficiency, unspecified: Secondary | ICD-10-CM

## 2013-06-23 DIAGNOSIS — I10 Essential (primary) hypertension: Secondary | ICD-10-CM

## 2013-06-23 LAB — BASIC METABOLIC PANEL
BUN: 72 mg/dL — AB (ref 6–23)
CALCIUM: 9.1 mg/dL (ref 8.4–10.5)
CO2: 24 mEq/L (ref 19–32)
Chloride: 93 mEq/L — ABNORMAL LOW (ref 96–112)
Creatinine, Ser: 1.76 mg/dL — ABNORMAL HIGH (ref 0.50–1.35)
GFR, EST AFRICAN AMERICAN: 46 mL/min — AB (ref >90)
GFR, EST NON AFRICAN AMERICAN: 40 mL/min — AB (ref >90)
Glucose, Bld: 138 mg/dL — ABNORMAL HIGH (ref 70–99)
Potassium: 4.9 mEq/L (ref 3.7–5.3)
Sodium: 132 mEq/L — ABNORMAL LOW (ref 137–147)

## 2013-06-23 LAB — URINE CULTURE
COLONY COUNT: NO GROWTH
CULTURE: NO GROWTH

## 2013-06-23 LAB — CULTURE, GROUP A STREP

## 2013-06-23 MED ORDER — PREDNISONE 50 MG PO TABS: ORAL_TABLET | ORAL | Status: DC

## 2013-06-23 MED ORDER — TRAMADOL HCL 50 MG PO TABS
50.0000 mg | ORAL_TABLET | Freq: Four times a day (QID) | ORAL | Status: DC | PRN
  Administered 2013-06-23: 50 mg via ORAL
  Filled 2013-06-23: qty 1

## 2013-06-23 MED ORDER — TRAMADOL HCL 50 MG PO TABS: 50.0000 mg | ORAL_TABLET | Freq: Four times a day (QID) | ORAL | Status: DC | PRN

## 2013-06-23 MED ORDER — BENZONATATE 100 MG PO CAPS
100.0000 mg | ORAL_CAPSULE | Freq: Three times a day (TID) | ORAL | Status: DC | PRN
  Filled 2013-06-23: qty 1

## 2013-06-23 MED ORDER — ALBUTEROL SULFATE HFA 108 (90 BASE) MCG/ACT IN AERS: 2.0000 | INHALATION_SPRAY | Freq: Four times a day (QID) | RESPIRATORY_TRACT | Status: DC | PRN

## 2013-06-23 MED ORDER — OSELTAMIVIR PHOSPHATE 75 MG PO CAPS: 75.0000 mg | ORAL_CAPSULE | Freq: Every day | ORAL | Status: DC

## 2013-06-23 MED ORDER — BENZONATATE 100 MG PO CAPS: 100.0000 mg | ORAL_CAPSULE | Freq: Three times a day (TID) | ORAL | Status: DC | PRN

## 2013-06-23 MED ORDER — OSELTAMIVIR PHOSPHATE 75 MG PO CAPS
75.0000 mg | ORAL_CAPSULE | Freq: Two times a day (BID) | ORAL | Status: DC
  Filled 2013-06-23: qty 1

## 2013-06-23 NOTE — Discharge Summary (Signed)
Physician Discharge Summary  Jonathan Wilson YTK:[id number] DOB: Dec 14, 1950 DOA: 06/21/2013  PCP: Aurora Mask, NP  Admit date: 06/21/2013 Discharge date: 06/23/2013  Time spent: > 35 minutes  Recommendations for Outpatient Follow-up:  1. Please followup with sodium levels 2. -Followup with serum creatinine levels  Discharge Diagnoses:  Principal Problem:   Acute respiratory failure with hypoxia Active Problems:   GOUT   TOBACCO ABUSE   Hypertension   Vitamin D deficiency   Shortness of breath   Elevated brain natriuretic peptide (BNP) level   Influenza-like illness   COPD with exacerbation   Acute respiratory failure   Abnormal EKG: incomplete RBBB and LAFB   Discharge Condition: stable  Diet recommendation: Low sodium heart healthy  Filed Weights   06/21/13 1320  Weight: 123.2 kg (271 lb 9.7 oz)    History of present illness:  Patient is a 63 year old with past medical history of GERD, tobacco abuse, mild COPD. Who presented to the ED complaining of shortness of breath cough and fever. Was found to be influenza A+   Hospital Course:  Influenza A. - Will discharge on Tamiflu to complete a five-day course  Mild COPD - Discharge him short course of oral prednisone to complete a five-day total course - Discharge with albuterol HFA  Gout -Stable  Tobacco abuse - Reportedly patient quit smoking  Hypertension - Discontinued lisinopril on discharge 2ary to acute renal failure.  Procedures:  Echocardiogram with normal EF and grade 1 DD  Consultations:  NOne  Discharge Exam: Filed Vitals:   06/23/13 0809  BP:   Pulse: 65  Temp:   Resp: 20    General: Pt in NAD, Alert and awake Cardiovascular: RRR, no MRG Respiratory: No wheezes, breath sounds bilaterally, speaking in full sentences on room air  Discharge Instructions  Discharge Orders   Future Orders Complete By Expires   Call MD for:  difficulty breathing, headache or visual disturbances  As  directed    Call MD for:  redness, tenderness, or signs of infection (pain, swelling, redness, odor or green/yellow discharge around incision site)  As directed    Call MD for:  severe uncontrolled pain  As directed    Call MD for:  temperature >100.4  As directed    Diet - low sodium heart healthy  As directed    Discharge instructions  As directed    Comments:     Please be sure to follow up with your primary care physician in 1-2 weeks or sooner should any new concerns arise.   Increase activity slowly  As directed        Medication List    STOP taking these medications       lisinopril 20 MG tablet  Commonly known as:  PRINIVIL,ZESTRIL      TAKE these medications       albuterol 108 (90 BASE) MCG/ACT inhaler  Commonly known as:  PROVENTIL HFA;VENTOLIN HFA  Inhale 2 puffs into the lungs every 6 (six) hours as needed for wheezing or shortness of breath.     allopurinol 300 MG tablet  Commonly known as:  ZYLOPRIM  Take 300 mg by mouth daily.     ALPRAZolam 1 MG tablet  Commonly known as:  XANAX  Take 1 mg by mouth 3 (three) times daily.     aspirin EC 81 MG tablet  Take 81 mg by mouth daily.     atenolol 100 MG tablet  Commonly known as:  TENORMIN  TAKE ONE TABLET  BY MOUTH ONCE DAILY FOR BLOOD PRESSURE     benzonatate 100 MG capsule  Commonly known as:  TESSALON  Take 1 capsule (100 mg total) by mouth 3 (three) times daily as needed for cough.     cholecalciferol 1000 UNITS tablet  Commonly known as:  VITAMIN D  Take 5,000 Units by mouth daily.     citalopram 40 MG tablet  Commonly known as:  CELEXA  Take 1 tablet (40 mg total) by mouth daily.     multivitamin with minerals Tabs tablet  Take 1 tablet by mouth daily.     oseltamivir 75 MG capsule  Commonly known as:  TAMIFLU  Take 1 capsule (75 mg total) by mouth daily.     predniSONE 50 MG tablet  Commonly known as:  DELTASONE  Take 1 tablet daily for the next 2 days.     traMADol 50 MG tablet   Commonly known as:  ULTRAM  Take 1 tablet (50 mg total) by mouth every 6 (six) hours as needed for moderate pain.     VITAMIN B-12 CR PO  Take 1 tablet by mouth daily.       Allergies  Allergen Reactions  . Tylenol [Acetaminophen]     Intolerant to Tylenol with codeine      The results of significant diagnostics from this hospitalization (including imaging, microbiology, ancillary and laboratory) are listed below for reference.    Significant Diagnostic Studies: Dg Chest 2 View  06/21/2013   CLINICAL DATA:  Shortness of breath.  Cough.  Fever.  Chest pain.  EXAM: CHEST  2 VIEW  COMPARISON:  02/22/2013  FINDINGS: The heart size and mediastinal contours are within normal limits. Both lungs are clear. No evidence of pleural effusion. No mass or lymphadenopathy identified. Thoracic spine degenerative changes again noted  IMPRESSION: No active cardiopulmonary disease.   Electronically Signed   By: Earle Gell M.D.   On: 06/21/2013 09:15   US Renal  06/22/2013   CLINICAL DATA:  Acute renal failure.  EXAM: RENAL/URINARY TRACT ULTRASOUND COMPLETE  COMPARISON:  CT ABD/PELVIS W CM dated 10/09/2009; CT ANGIO CHEST W/CM &/OR WO/CM dated 03/15/2010  FINDINGS: Right Kidney: 10.5 cm. No hydronephrosis. Normal renal cortical thickness and echogenicity. Upper pole 2.2 cm hyperechoic lesion.  Left Kidney: 11.9 cm. No hydronephrosis. Normal renal cortical thickness and echogenicity.  Bladder:  Within normal limits.  IMPRESSION: 1. No acute process or explanation for acute renal failure. No hydronephrosis. 2. Upper pole right renal hyperechoic lesion. Although this may represent an angiomyolipoma, it is technically indeterminate (renal cell carcinoma could look similar). Recommend nonemergent pre and post contrast renal protocol CT. These results will be called to the ordering clinician or representative by the Radiologist Assistant, and communication documented in the PACS Dashboard.   Electronically Signed    By: Abigail Miyamoto M.D.   On: 06/22/2013 19:15    Microbiology: Recent Results (from the past 240 hour(s))  RESPIRATORY VIRUS PANEL     Status: Abnormal   Collection Time    06/21/13 10:08 AM      Result Value Range Status   Source - RVPAN NASAL SWAB   Corrected   Comment: CORRECTED ON 01/20 AT 1804: PREVIOUSLY REPORTED AS NASAL SWAB   Respiratory Syncytial Virus A NOT DETECTED   Final   Respiratory Syncytial Virus B NOT DETECTED   Final   Influenza A DETECTED (*)  Final   Influenza B NOT DETECTED   Final   Parainfluenza  1 NOT DETECTED   Final   Parainfluenza 2 NOT DETECTED   Final   Parainfluenza 3 NOT DETECTED   Final   Metapneumovirus NOT DETECTED   Final   Rhinovirus NOT DETECTED   Final   Adenovirus NOT DETECTED   Final   Influenza A H1 NOT DETECTED   Final   Influenza A H3 DETECTED (*)  Final   Comment: (NOTE)           Normal Reference Range for each Analyte: NOT DETECTED     Testing performed using the Luminex xTAG Respiratory Viral Panel test     kit.     This test was developed and its performance characteristics determined     by [employer]. It has not been cleared or approved by the [place]     Food and Drug Administration. This test is used for clinical purposes.     It should not be regarded as investigational or for research. This     laboratory is certified under the [place] (CLIA) as qualified to perform high complexity     clinical laboratory testing.     Performed at [place]     Status: None   Collection Time    06/21/13 12:48 PM      Result Value Range Status   Streptococcus, Group A Screen (Direct) NEGATIVE  NEGATIVE Final   Comment: (NOTE)     A Rapid Antigen test may result negative if the antigen level in the     sample is below the detection level of this test. The FDA has not     cleared this test as a stand-alone test therefore the rapid antigen     negative result  has reflexed to a Group A Strep culture.  CULTURE, GROUP A STREP     Status: None   Collection Time    06/21/13 12:48 PM      Result Value Range Status   Specimen Description THROAT   Final   Special Requests NONE   Final   Culture     Final   Value: No Beta Hemolytic Streptococci Isolated     Performed at [facility]   Report Status 06/23/2013 FINAL   Final  URINE CULTURE     Status: None   Collection Time    06/22/13 12:23 PM      Result Value Range Status   Specimen Description URINE, CLEAN CATCH   Final   Special Requests NONE   Final   Culture  Setup Time     Final   Value: 06/22/2013 16:38     Performed at [facility]     Final   Value: NO GROWTH     Performed at [facility]   Culture     Final   Value: NO GROWTH     Performed at [facility]   Report Status 06/23/2013 FINAL   Final     Labs: Basic Metabolic Panel:  Recent Labs Lab 06/21/13 0908 06/21/13 1355 06/22/13 0151 06/23/13 0730  NA 133*  --  133* 132*  K 4.8  --  4.8 4.9  CL 93*  --  90* 93*  CO2 24  --  27 24  GLUCOSE 115*  --  128* 138*  BUN 19  --  41* 72*  CREATININE 0.91 1.44* 2.61* 1.76*  CALCIUM  8.9  --  9.0 9.1   Liver Function Tests:  Recent Labs Lab 06/21/13 0908  AST 76*  ALT 32  ALKPHOS 74  BILITOT 0.7  PROT 7.9  ALBUMIN 4.1   No results found for this basename: LIPASE, AMYLASE,  in the last 168 hours No results found for this basename: AMMONIA,  in the last 168 hours CBC:  Recent Labs Lab 06/21/13 0908 06/21/13 1355 06/22/13 0151  WBC 6.0 6.3 5.1  HGB 16.1 15.2 14.7  HCT 44.9 44.2 41.9  MCV 94.1 97.4 97.2  PLT 119* 121* 121*   Cardiac Enzymes:  Recent Labs Lab 06/21/13 0908 06/21/13 1355 06/21/13 1907 06/22/13 0151  TROPONINI <0.30 <0.30 <0.30 <0.30   BNP: BNP (last 3 results)  Recent Labs  06/21/13 0908  PROBNP 2813.0*   CBG: No results found for this basename: GLUCAP,  in the last 168  hours     Signed:  Velvet Bathe  [facility] 06/23/2013, 6:22 PM

## 2013-06-23 NOTE — Progress Notes (Signed)
Discharge instructions explained using teach back, prescriptions given. Stable for discharge. 

## 2013-07-04 ENCOUNTER — Other Ambulatory Visit: Payer: Self-pay | Admitting: Internal Medicine

## 2013-07-07 ENCOUNTER — Telehealth: Payer: Self-pay | Admitting: *Deleted

## 2013-07-07 NOTE — Telephone Encounter (Signed)
Message copied by Waunita Schooner on Wed Jul 07, 2013  3:21 PM ------      Message from: Kelby Aline R      Created: Mon Jul 05, 2013  6:10 AM       He needs ov for recheck labs from ER visit ------

## 2013-07-16 ENCOUNTER — Encounter: Payer: Self-pay | Admitting: Emergency Medicine

## 2013-07-16 ENCOUNTER — Ambulatory Visit (INDEPENDENT_AMBULATORY_CARE_PROVIDER_SITE_OTHER): Payer: Self-pay | Admitting: Emergency Medicine

## 2013-07-16 VITALS — BP 122/80 | HR 74 | Temp 98.0°F | Resp 18 | Ht 74.5 in | Wt 277.0 lb

## 2013-07-16 DIAGNOSIS — F329 Major depressive disorder, single episode, unspecified: Secondary | ICD-10-CM

## 2013-07-16 DIAGNOSIS — E871 Hypo-osmolality and hyponatremia: Secondary | ICD-10-CM

## 2013-07-16 DIAGNOSIS — F3289 Other specified depressive episodes: Secondary | ICD-10-CM

## 2013-07-16 DIAGNOSIS — F32A Depression, unspecified: Secondary | ICD-10-CM

## 2013-07-16 LAB — CBC WITH DIFFERENTIAL/PLATELET
BASOS ABS: 0 10*3/uL (ref 0.0–0.1)
Basophils Relative: 0 % (ref 0–1)
EOS PCT: 2 % (ref 0–5)
Eosinophils Absolute: 0.1 10*3/uL (ref 0.0–0.7)
HCT: 41.5 % (ref 39.0–52.0)
Hemoglobin: 14.6 g/dL (ref 13.0–17.0)
Lymphocytes Relative: 30 % (ref 12–46)
Lymphs Abs: 1.3 10*3/uL (ref 0.7–4.0)
MCH: 32.5 pg (ref 26.0–34.0)
MCHC: 35.2 g/dL (ref 30.0–36.0)
MCV: 92.4 fL (ref 78.0–100.0)
MONO ABS: 0.5 10*3/uL (ref 0.1–1.0)
Monocytes Relative: 12 % (ref 3–12)
Neutro Abs: 2.4 10*3/uL (ref 1.7–7.7)
Neutrophils Relative %: 56 % (ref 43–77)
Platelets: 157 10*3/uL (ref 150–400)
RBC: 4.49 MIL/uL (ref 4.22–5.81)
RDW: 13 % (ref 11.5–15.5)
WBC: 4.2 10*3/uL (ref 4.0–10.5)

## 2013-07-16 LAB — COMPREHENSIVE METABOLIC PANEL
ALK PHOS: 66 U/L (ref 39–117)
ALT: 18 U/L (ref 0–53)
AST: 17 U/L (ref 0–37)
Albumin: 4.2 g/dL (ref 3.5–5.2)
BUN: 17 mg/dL (ref 6–23)
CALCIUM: 9.5 mg/dL (ref 8.4–10.5)
CO2: 33 mEq/L — ABNORMAL HIGH (ref 19–32)
CREATININE: 0.98 mg/dL (ref 0.50–1.35)
Chloride: 100 mEq/L (ref 96–112)
Glucose, Bld: 105 mg/dL — ABNORMAL HIGH (ref 70–99)
Potassium: 5.2 mEq/L (ref 3.5–5.3)
Sodium: 139 mEq/L (ref 135–145)
Total Bilirubin: 0.6 mg/dL (ref 0.2–1.2)
Total Protein: 6.5 g/dL (ref 6.0–8.3)

## 2013-07-16 NOTE — Progress Notes (Signed)
Subjective:    Patient ID: Jonathan Wilson, male    DOB: 09-23-1950, 63 y.o.   MRN: [id number]  HPI Comments: 63 yo male hospital f/u for hyponatremia. He is feeling better he was treated for the Flu at the hospital. He is back to work and keeping more busy and notes BP has been much better. He has lost some weight with increased activity and eating better, and has had less gout flares.   He notes wife just passed away at the hospital recently. He notes he has good and bad days. He denies any major depression and has been talking with friends about her. He denies any need for RX change. He feels he is overall doing well with his grieving.   Current Outpatient Prescriptions on File Prior to Visit  Medication Sig Dispense Refill  . allopurinol (ZYLOPRIM) 300 MG tablet TAKE ONE TABLET BY MOUTH ONCE DAILY TO PREVENT GOUT  90 tablet  1  . ALPRAZolam (XANAX) 1 MG tablet Take 1 mg by mouth 3 (three) times daily.      [name] aspirin EC 81 MG tablet Take 81 mg by mouth daily.      [name] atenolol (TENORMIN) 100 MG tablet TAKE ONE TABLET BY MOUTH ONCE DAILY FOR BLOOD PRESSURE  90 tablet  1  . cholecalciferol (VITAMIN D) 1000 UNITS tablet Take 5,000 Units by mouth daily.      . citalopram (CELEXA) 40 MG tablet Take 1 tablet (40 mg total) by mouth daily.  90 tablet  1  . Cyanocobalamin (VITAMIN B-12 CR PO) Take 1 tablet by mouth daily.      . Multiple Vitamin (MULTIVITAMIN WITH MINERALS) TABS tablet Take 1 tablet by mouth daily.       No current facility-administered medications on file prior to visit.   No Known Allergies Past Medical History  Diagnosis Date  . Pneumonia   . Hypertension   . Hyperlipidemia   . Gout   . Vitamin D deficiency   . Hepatitis C   . Arthritis      Review of Systems  Psychiatric/Behavioral: Negative for suicidal ideas.  All other systems reviewed and are negative.   BP 122/80  Pulse 74  Temp(Src) 98 F (36.7 C) (Temporal)  Resp 18  Ht 6' 2.5" (1.892 m)  Wt 277 lb  (125.646 kg)  BMI 35.10 kg/m2     Objective:   Physical Exam  Nursing note and vitals reviewed. Constitutional: He is oriented to person, place, and time. He appears well-developed and well-nourished.  HENT:  Head: Normocephalic and atraumatic.  Right Ear: External ear normal.  Left Ear: External ear normal.  Nose: Nose normal.  Eyes: Conjunctivae and EOM are normal.  Neck: Normal range of motion. Neck supple. No JVD present. No thyromegaly present.  Cardiovascular: Normal rate, regular rhythm, normal heart sounds and intact distal pulses.   Pulmonary/Chest: Effort normal and breath sounds normal.  Abdominal: Soft. Bowel sounds are normal. He exhibits no distension and no mass. There is no tenderness. There is no rebound and no guarding.  Musculoskeletal: Normal range of motion. He exhibits no edema and no tenderness.  Lymphadenopathy:    He has no cervical adenopathy.  Neurological: He is alert and oriented to person, place, and time. He has normal reflexes. No cranial nerve deficit. Coordination normal.  Skin: Skin is warm and dry.  Psychiatric: He has a normal mood and affect. His behavior is normal. Judgment and thought content normal.  Tearful but  appropriate          Assessment & Plan:  1. Hyponatremia/ abnormal LFT with hx HEP C- Recheck labs from hospital 2. Mild depression with loss of wife recently, advised hospice counseling, increase activity, w/c if SX increase or ER.

## 2013-07-16 NOTE — Patient Instructions (Signed)
Depression, Adult Depression is feeling sad, low, down in the dumps, blue, gloomy, or empty. In general, there are two kinds of depression:  Normal sadness or grief. This can happen after something upsetting. It often goes away on its own within 2 weeks. After losing a loved one (bereavement), normal sadness and grief may last longer than two weeks. It usually gets better with time.  Clinical depression. This kind lasts longer than normal sadness or grief. It keeps you from doing the things you normally do in life. It is often hard to function at home, work, or at school. It may affect your relationships with others. Treatment is often needed. GET HELP RIGHT AWAY IF:  You have thoughts about hurting yourself or others.  You lose touch with reality (psychotic symptoms). You may:  See or hear things that are not real.  Have untrue beliefs about your life or people around you.  Your medicine is giving you problems. MAKE SURE YOU:  Understand these instructions.  Will watch your condition.  Will get help right away if you are not doing well or get worse. Document Released: 06/22/2010 Document Revised: 02/12/2012 Document Reviewed: 09/19/2011 [facility] Patient Information 2014 [place], [place]. Hyponatremia  Hyponatremia is when the salt (sodium) in your blood is low. When salt becomes low, your cells take in extra water and puff up (swell). The puffiness can happen in the whole body. It mostly affects the brain and is very serious.  HOME CARE  Only take medicine as told by your doctor.  Follow any diet instructions you were given. This includes limiting how much fluid you drink.  Keep all doctor visits for tests as told.  Avoid alcohol and drugs. GET HELP RIGHT AWAY IF:  You start to twitch and shake (seize).  You pass out (faint).  You continue to have watery poop (diarrhea) or you throw up (vomit).  You feel sick to your stomach (nauseous).  You are tired (fatigued), have a  headache, are confused, or feel weak.  Your problems that first brought you to the doctor come back.  You have trouble following your diet instructions. MAKE SURE YOU:   Understand these instructions.  Will watch your condition.  Will get help right away if you are not doing well or get worse. Document Released: 01/30/2011 Document Revised: 08/12/2011 Document Reviewed: 01/30/2011 [facility] Patient Information 2014 [place], [place].

## 2013-08-03 ENCOUNTER — Other Ambulatory Visit: Payer: Self-pay | Admitting: Internal Medicine

## 2013-09-02 ENCOUNTER — Other Ambulatory Visit: Payer: Self-pay | Admitting: Emergency Medicine

## 2013-09-03 ENCOUNTER — Other Ambulatory Visit: Payer: Self-pay | Admitting: Emergency Medicine

## 2013-09-27 ENCOUNTER — Other Ambulatory Visit: Payer: Self-pay | Admitting: Emergency Medicine

## 2013-10-26 ENCOUNTER — Other Ambulatory Visit: Payer: Self-pay | Admitting: Physician Assistant

## 2013-10-26 ENCOUNTER — Encounter: Payer: Self-pay | Admitting: Internal Medicine

## 2013-10-27 ENCOUNTER — Other Ambulatory Visit: Payer: Self-pay | Admitting: Physician Assistant

## 2013-10-28 ENCOUNTER — Other Ambulatory Visit: Payer: Self-pay | Admitting: Emergency Medicine

## 2013-10-28 MED ORDER — ALPRAZOLAM 1 MG PO TABS: ORAL_TABLET | ORAL | Status: DC

## 2013-10-29 ENCOUNTER — Other Ambulatory Visit: Payer: Self-pay | Admitting: Physician Assistant

## 2013-11-21 ENCOUNTER — Other Ambulatory Visit: Payer: Self-pay | Admitting: Emergency Medicine

## 2013-11-21 ENCOUNTER — Other Ambulatory Visit: Payer: Self-pay | Admitting: Internal Medicine

## 2013-11-27 ENCOUNTER — Other Ambulatory Visit: Payer: Self-pay | Admitting: Emergency Medicine

## 2013-11-28 ENCOUNTER — Other Ambulatory Visit: Payer: Self-pay | Admitting: Physician Assistant

## 2013-11-29 NOTE — Telephone Encounter (Signed)
Called into pharm  

## 2013-12-01 ENCOUNTER — Encounter: Payer: Self-pay | Admitting: Gastroenterology

## 2013-12-02 ENCOUNTER — Other Ambulatory Visit: Payer: Self-pay | Admitting: *Deleted

## 2013-12-02 MED ORDER — ATENOLOL 100 MG PO TABS: ORAL_TABLET | ORAL | Status: DC

## 2013-12-16 NOTE — Progress Notes (Signed)
Patient ID: Jonathan Wilson, male   DOB: 1950/12/31, 63 y.o.   MRN: [id number]  [name]

## 2013-12-17 ENCOUNTER — Encounter: Payer: Self-pay | Admitting: Internal Medicine

## 2013-12-17 DIAGNOSIS — Z0000 Encounter for general adult medical examination without abnormal findings: Secondary | ICD-10-CM

## 2013-12-28 ENCOUNTER — Other Ambulatory Visit: Payer: Self-pay | Admitting: Emergency Medicine

## 2014-01-05 ENCOUNTER — Other Ambulatory Visit: Payer: Self-pay | Admitting: Internal Medicine

## 2014-01-12 ENCOUNTER — Inpatient Hospital Stay (HOSPITAL_COMMUNITY)
Admission: EM | Admit: 2014-01-12 | Discharge: 2014-01-17 | DRG: 419 | Disposition: A | Discharge status: Home / Self Care | Payer: BC Managed Care – PPO | Attending: Internal Medicine | Admitting: Internal Medicine

## 2014-01-12 ENCOUNTER — Encounter (HOSPITAL_COMMUNITY): Payer: Self-pay | Admitting: Emergency Medicine

## 2014-01-12 ENCOUNTER — Emergency Department (HOSPITAL_COMMUNITY): Payer: BC Managed Care – PPO

## 2014-01-12 DIAGNOSIS — R1013 Epigastric pain: Secondary | ICD-10-CM | POA: Diagnosis not present

## 2014-01-12 DIAGNOSIS — Z7982 Long term (current) use of aspirin: Secondary | ICD-10-CM

## 2014-01-12 DIAGNOSIS — B1920 Unspecified viral hepatitis C without hepatic coma: Secondary | ICD-10-CM | POA: Diagnosis present

## 2014-01-12 DIAGNOSIS — E785 Hyperlipidemia, unspecified: Secondary | ICD-10-CM

## 2014-01-12 DIAGNOSIS — K8050 Calculus of bile duct without cholangitis or cholecystitis without obstruction: Secondary | ICD-10-CM | POA: Diagnosis present

## 2014-01-12 DIAGNOSIS — I10 Essential (primary) hypertension: Secondary | ICD-10-CM

## 2014-01-12 DIAGNOSIS — J4489 Other specified chronic obstructive pulmonary disease: Secondary | ICD-10-CM | POA: Diagnosis present

## 2014-01-12 DIAGNOSIS — F17200 Nicotine dependence, unspecified, uncomplicated: Secondary | ICD-10-CM | POA: Diagnosis present

## 2014-01-12 DIAGNOSIS — Z79899 Other long term (current) drug therapy: Secondary | ICD-10-CM | POA: Diagnosis not present

## 2014-01-12 DIAGNOSIS — Z800 Family history of malignant neoplasm of digestive organs: Secondary | ICD-10-CM | POA: Diagnosis not present

## 2014-01-12 DIAGNOSIS — N2889 Other specified disorders of kidney and ureter: Secondary | ICD-10-CM

## 2014-01-12 DIAGNOSIS — K831 Obstruction of bile duct: Secondary | ICD-10-CM

## 2014-01-12 DIAGNOSIS — K8001 Calculus of gallbladder with acute cholecystitis with obstruction: Secondary | ICD-10-CM | POA: Diagnosis not present

## 2014-01-12 DIAGNOSIS — K810 Acute cholecystitis: Secondary | ICD-10-CM

## 2014-01-12 DIAGNOSIS — N289 Disorder of kidney and ureter, unspecified: Secondary | ICD-10-CM | POA: Diagnosis present

## 2014-01-12 DIAGNOSIS — Z833 Family history of diabetes mellitus: Secondary | ICD-10-CM | POA: Diagnosis not present

## 2014-01-12 DIAGNOSIS — M109 Gout, unspecified: Secondary | ICD-10-CM | POA: Diagnosis present

## 2014-01-12 DIAGNOSIS — Z6831 Body mass index (BMI) 31.0-31.9, adult: Secondary | ICD-10-CM | POA: Diagnosis not present

## 2014-01-12 DIAGNOSIS — E669 Obesity, unspecified: Secondary | ICD-10-CM | POA: Diagnosis present

## 2014-01-12 DIAGNOSIS — Z8701 Personal history of pneumonia (recurrent): Secondary | ICD-10-CM

## 2014-01-12 DIAGNOSIS — J449 Chronic obstructive pulmonary disease, unspecified: Secondary | ICD-10-CM

## 2014-01-12 HISTORY — DX: Unspecified psychosis not due to a substance or known physiological condition: F29

## 2014-01-12 HISTORY — DX: Obesity, unspecified: E66.9

## 2014-01-12 HISTORY — DX: Anxiety disorder, unspecified: F41.9

## 2014-01-12 HISTORY — DX: Diverticulosis of large intestine without perforation or abscess without bleeding: K57.30

## 2014-01-12 HISTORY — DX: Major depressive disorder, single episode, unspecified: F32.9

## 2014-01-12 HISTORY — DX: Other nonspecific abnormal finding of lung field: R91.8

## 2014-01-12 HISTORY — DX: Influenza due to other identified influenza virus with other respiratory manifestations: J10.1

## 2014-01-12 LAB — CBC WITH DIFFERENTIAL/PLATELET
BASOS ABS: 0 10*3/uL (ref 0.0–0.1)
BASOS PCT: 0 % (ref 0–1)
EOS ABS: 0.1 10*3/uL (ref 0.0–0.7)
EOS PCT: 1 % (ref 0–5)
HCT: 44.2 % (ref 39.0–52.0)
Hemoglobin: 15.8 g/dL (ref 13.0–17.0)
LYMPHS PCT: 8 % — AB (ref 12–46)
Lymphs Abs: 0.8 10*3/uL (ref 0.7–4.0)
MCH: 33.2 pg (ref 26.0–34.0)
MCHC: 35.7 g/dL (ref 30.0–36.0)
MCV: 92.9 fL (ref 78.0–100.0)
Monocytes Absolute: 0.4 10*3/uL (ref 0.1–1.0)
Monocytes Relative: 4 % (ref 3–12)
Neutro Abs: 8.3 10*3/uL — ABNORMAL HIGH (ref 1.7–7.7)
Neutrophils Relative %: 87 % — ABNORMAL HIGH (ref 43–77)
PLATELETS: 151 10*3/uL (ref 150–400)
RBC: 4.76 MIL/uL (ref 4.22–5.81)
RDW: 13.2 % (ref 11.5–15.5)
WBC: 9.5 10*3/uL (ref 4.0–10.5)

## 2014-01-12 LAB — HEPATIC FUNCTION PANEL
ALBUMIN: 4.1 g/dL (ref 3.5–5.2)
ALK PHOS: 74 U/L (ref 39–117)
ALT: 16 U/L (ref 0–53)
AST: 20 U/L (ref 0–37)
Bilirubin, Direct: <0.2 mg/dL (ref 0.0–0.3)
TOTAL PROTEIN: 7.1 g/dL (ref 6.0–8.3)
Total Bilirubin: 0.5 mg/dL (ref 0.3–1.2)

## 2014-01-12 LAB — COMPREHENSIVE METABOLIC PANEL
ALK PHOS: 73 U/L (ref 39–117)
ALT: 14 U/L (ref 0–53)
AST: 17 U/L (ref 0–37)
Albumin: 4 g/dL (ref 3.5–5.2)
Anion gap: 13 (ref 5–15)
BILIRUBIN TOTAL: 0.5 mg/dL (ref 0.3–1.2)
BUN: 13 mg/dL (ref 6–23)
CHLORIDE: 103 meq/L (ref 96–112)
CO2: 24 meq/L (ref 19–32)
CREATININE: 0.79 mg/dL (ref 0.50–1.35)
Calcium: 9.6 mg/dL (ref 8.4–10.5)
GFR calc Af Amer: >90 mL/min (ref >90)
Glucose, Bld: 150 mg/dL — ABNORMAL HIGH (ref 70–99)
Potassium: 3.7 mEq/L (ref 3.7–5.3)
Sodium: 140 mEq/L (ref 137–147)
Total Protein: 6.9 g/dL (ref 6.0–8.3)

## 2014-01-12 LAB — URINALYSIS, ROUTINE W REFLEX MICROSCOPIC
BILIRUBIN URINE: NEGATIVE
GLUCOSE, UA: NEGATIVE mg/dL
HGB URINE DIPSTICK: NEGATIVE
KETONES UR: NEGATIVE mg/dL
Leukocytes, UA: NEGATIVE
Nitrite: NEGATIVE
Protein, ur: NEGATIVE mg/dL
SPECIFIC GRAVITY, URINE: 1.017 (ref 1.005–1.030)
Urobilinogen, UA: 0.2 mg/dL (ref 0.0–1.0)
pH: 6.5 (ref 5.0–8.0)

## 2014-01-12 LAB — LIPASE, BLOOD: LIPASE: 26 U/L (ref 11–59)

## 2014-01-12 LAB — I-STAT TROPONIN, ED: Troponin i, poc: 0 ng/mL (ref 0.00–0.08)

## 2014-01-12 LAB — I-STAT CG4 LACTIC ACID, ED: Lactic Acid, Venous: 2.1 mmol/L (ref 0.5–2.2)

## 2014-01-12 MED ORDER — ONDANSETRON HCL 4 MG/2ML IJ SOLN
4.0000 mg | Freq: Four times a day (QID) | INTRAMUSCULAR | Status: DC | PRN
  Administered 2014-01-12 – 2014-01-15 (×9): 4 mg via INTRAVENOUS
  Filled 2014-01-12 (×9): qty 2

## 2014-01-12 MED ORDER — ONDANSETRON HCL 4 MG/2ML IJ SOLN
4.0000 mg | Freq: Once | INTRAMUSCULAR | Status: AC
  Administered 2014-01-12: 4 mg via INTRAVENOUS
  Filled 2014-01-12: qty 2

## 2014-01-12 MED ORDER — SODIUM CHLORIDE 0.9 % IV SOLN
INTRAVENOUS | Status: AC
  Administered 2014-01-12: 15:00:00 via INTRAVENOUS
  Administered 2014-01-13: 100 mL/h via INTRAVENOUS

## 2014-01-12 MED ORDER — LISINOPRIL 40 MG PO TABS
40.0000 mg | ORAL_TABLET | Freq: Every morning | ORAL | Status: DC
  Administered 2014-01-12 – 2014-01-17 (×6): 40 mg via ORAL
  Filled 2014-01-12 (×6): qty 1

## 2014-01-12 MED ORDER — HYDROMORPHONE HCL PF 1 MG/ML IJ SOLN
1.0000 mg | Freq: Once | INTRAMUSCULAR | Status: AC
  Administered 2014-01-12: 1 mg via INTRAVENOUS
  Filled 2014-01-12: qty 1

## 2014-01-12 MED ORDER — ALLOPURINOL 300 MG PO TABS
300.0000 mg | ORAL_TABLET | Freq: Every day | ORAL | Status: DC
  Administered 2014-01-13 – 2014-01-17 (×5): 300 mg via ORAL
  Filled 2014-01-12 (×5): qty 1

## 2014-01-12 MED ORDER — ALPRAZOLAM 1 MG PO TABS
1.0000 mg | ORAL_TABLET | Freq: Three times a day (TID) | ORAL | Status: DC | PRN
  Administered 2014-01-12 – 2014-01-17 (×11): 1 mg via ORAL
  Filled 2014-01-12 (×11): qty 1

## 2014-01-12 MED ORDER — ADULT MULTIVITAMIN W/MINERALS CH
1.0000 | ORAL_TABLET | Freq: Every morning | ORAL | Status: DC
  Administered 2014-01-12 – 2014-01-17 (×6): 1 via ORAL
  Filled 2014-01-12 (×6): qty 1

## 2014-01-12 MED ORDER — CITALOPRAM HYDROBROMIDE 40 MG PO TABS
40.0000 mg | ORAL_TABLET | Freq: Every day | ORAL | Status: DC
  Administered 2014-01-12 – 2014-01-17 (×6): 40 mg via ORAL
  Filled 2014-01-12 (×6): qty 1

## 2014-01-12 MED ORDER — CIPROFLOXACIN IN D5W 400 MG/200ML IV SOLN
400.0000 mg | Freq: Two times a day (BID) | INTRAVENOUS | Status: DC
  Administered 2014-01-12 – 2014-01-13 (×2): 400 mg via INTRAVENOUS
  Filled 2014-01-12 (×2): qty 200

## 2014-01-12 MED ORDER — VITAMIN D3 25 MCG (1000 UNIT) PO TABS
5000.0000 [IU] | ORAL_TABLET | Freq: Every day | ORAL | Status: DC
  Administered 2014-01-12 – 2014-01-17 (×6): 5000 [IU] via ORAL
  Filled 2014-01-12 (×6): qty 5

## 2014-01-12 MED ORDER — HYDROMORPHONE HCL PF 1 MG/ML IJ SOLN
1.0000 mg | INTRAMUSCULAR | Status: DC | PRN
  Administered 2014-01-12 – 2014-01-15 (×11): 1 mg via INTRAVENOUS
  Filled 2014-01-12 (×13): qty 1

## 2014-01-12 MED ORDER — ATENOLOL 100 MG PO TABS
100.0000 mg | ORAL_TABLET | Freq: Every day | ORAL | Status: DC
Start: 2014-01-12 — End: 2014-01-17
  Administered 2014-01-12 – 2014-01-14 (×3): 100 mg via ORAL
  Filled 2014-01-12 (×6): qty 1

## 2014-01-12 MED ORDER — MORPHINE SULFATE 4 MG/ML IJ SOLN
4.0000 mg | Freq: Once | INTRAMUSCULAR | Status: AC
  Administered 2014-01-12: 4 mg via INTRAVENOUS
  Filled 2014-01-12: qty 1

## 2014-01-12 MED ORDER — PNEUMOCOCCAL VAC POLYVALENT 25 MCG/0.5ML IJ INJ
0.5000 mL | INJECTION | INTRAMUSCULAR | Status: AC
  Administered 2014-01-13: 0.5 mL via INTRAMUSCULAR
  Filled 2014-01-12 (×2): qty 0.5

## 2014-01-12 MED ORDER — ONDANSETRON HCL 4 MG PO TABS: 4.0000 mg | ORAL_TABLET | Freq: Four times a day (QID) | ORAL | Status: DC | PRN

## 2014-01-12 MED ORDER — METOCLOPRAMIDE HCL 5 MG/ML IJ SOLN
10.0000 mg | Freq: Once | INTRAMUSCULAR | Status: AC
  Administered 2014-01-12: 10 mg via INTRAVENOUS
  Filled 2014-01-12: qty 2

## 2014-01-12 NOTE — ED Notes (Signed)
His abd. U/s has just been completed.  He is drowsy and is now relaxed in appearance.

## 2014-01-12 NOTE — ED Notes (Signed)
Report phoned to Sonia Baller, RN at this time on [address].  Will transport shortly.  He remains comfortable and in no distress.

## 2014-01-12 NOTE — ED Provider Notes (Signed)
CSN: [id number]     Arrival date & time 01/12/14  2458 History   First MD Initiated Contact with Patient 01/12/14 0831     Chief Complaint  Patient presents with  . Abdominal Pain      Patient is a 63 y.o. male presenting with abdominal pain. The history is provided by the patient.  Abdominal Pain Pain location:  Generalized Pain radiates to:  Does not radiate Pain severity:  Severe Onset quality:  Sudden Duration:  4 hours Timing:  Constant Progression:  Worsening Chronicity:  New Relieved by:  Nothing Worsened by:  Movement and palpation Associated symptoms: nausea, shortness of breath and vomiting   Associated symptoms: no chest pain, no diarrhea, no dysuria, no fever, no hematemesis and no hematochezia   Pt reports he woke up with abdominal pain and nausea/vomiting He has never had this pain before He went to bed feeling well without pain No diarrhea is reported No CP is reported but he feels short of breath due to abdominal pain No fever is reported  He admits to recent ETOH use but none in past 24 hours    Past Medical History  Diagnosis Date  . Pneumonia   . Hypertension   . Hyperlipidemia   . Gout   . Vitamin D deficiency   . Hepatitis C   . Arthritis    Past Surgical History  Procedure Laterality Date  . Mandible fracture surgery     Family History  Problem Relation Age of Onset  . Diabetes Mother   . Arthritis Mother   . Cancer Father     pancreatic cancer   History  Substance Use Topics  . Smoking status: Current Every Day Smoker -- 1.50 packs/day for 40 years    Types: Cigarettes  . Smokeless tobacco: Former Systems developer    Quit date: 06/06/2013  . Alcohol Use: No    Review of Systems  Constitutional: Negative for fever.  Respiratory: Positive for shortness of breath.   Cardiovascular: Negative for chest pain.  Gastrointestinal: Positive for nausea, vomiting and abdominal pain. Negative for diarrhea, blood in stool, hematochezia and hematemesis.   Genitourinary: Negative for dysuria.  All other systems reviewed and are negative.     Allergies  Review of patient's allergies indicates no known allergies.  Home Medications   Prior to Admission medications   Medication Sig Start Date End Date Taking? Authorizing Provider  allopurinol (ZYLOPRIM) 300 MG tablet Take 300 mg by mouth daily.   Yes Historical Provider, MD  ALPRAZolam Duanne Moron) 1 MG tablet Take 1 mg by mouth 3 (three) times daily as needed for anxiety.   Yes Historical Provider, MD  aspirin EC 81 MG tablet Take 81 mg by mouth daily.   Yes Historical Provider, MD  atenolol (TENORMIN) 100 MG tablet Take 100 mg by mouth at bedtime.   Yes Historical Provider, MD  cholecalciferol (VITAMIN D) 1000 UNITS tablet Take 5,000-10,000 Units by mouth daily.    Yes Historical Provider, MD  citalopram (CELEXA) 40 MG tablet Take 40 mg by mouth daily.   Yes Historical Provider, MD  Cyanocobalamin (VITAMIN B-12 CR PO) Take 1 tablet by mouth daily.   Yes Historical Provider, MD  lisinopril (PRINIVIL,ZESTRIL) 20 MG tablet TAKE ONE TABLET BY MOUTH TWICE DAILY 11/21/13   Ardis Hughs, PA-C  Multiple Vitamin (MULTIVITAMIN WITH MINERALS) TABS tablet Take 1 tablet by mouth daily.    Historical Provider, MD   BP 214/109  Pulse 65  Temp(Src) 97.8 F (  36.6 C) (Oral)  Resp 18  SpO2 100% Physical Exam CONSTITUTIONAL: Well developed/well nourished, uncomfortable appearing HEAD: Normocephalic/atraumatic EYES: EOMI/PERRL, no icterus ENMT: Mucous membranes dry NECK: supple no meningeal signs SPINE:entire spine nontender CV: S1/S2 noted, no murmurs/rubs/gallops noted LUNGS: Lungs are clear to auscultation bilaterally, no apparent distress ABDOMEN: soft, mild diffuse tenderness.  Moderate tenderness to RUQ, no rebound or guarding GU:no cva tenderness. No hernia noted.  No scrotal tenderness noted NEURO: Pt is awake/alert, moves all extremitiesx4 EXTREMITIES: pulses normal, full ROM SKIN: warm,  color normal PSYCH: no abnormalities of mood noted  ED Course  Procedures   9:16 AM Pt with onset of abd pain with vomiting this morning Initial reports were he had RLQ pain, but currently he localizes pain to RUQ Will treat pain, check labs and reassess 10:13 AM Pt with continued pain He has focal RUQ tenderness Will obtain US imaging 12:03 PM [place] results d/w GI on call ([place]) Will see in consult Recommend medicine admit Defer antibiotics for now (labs unremarkable, afebrile) 12:28 PM D/w dr Charlies Silvers Will admit to medical service  Labs Review Labs Reviewed  COMPREHENSIVE METABOLIC PANEL - Abnormal; Notable for the following:    Glucose, Bld 150 (*)    All other components within normal limits  CBC WITH DIFFERENTIAL - Abnormal; Notable for the following:    Neutrophils Relative % 87 (*)    Neutro Abs 8.3 (*)    Lymphocytes Relative 8 (*)    All other components within normal limits  LIPASE, BLOOD  URINALYSIS, ROUTINE W REFLEX MICROSCOPIC  I-STAT TROPOININ, ED  I-STAT CG4 LACTIC ACID, ED    Imaging Review US Abdomen Limited Ruq  01/12/2014   CLINICAL DATA:  Right upper quadrant pain  EXAM: US ABDOMEN LIMITED - RIGHT UPPER QUADRANT  COMPARISON:  CT abdomen 10/09/2009.  FINDINGS: Gallbladder:  The gallbladder is abnormal, containing sludge in showing wall thickening up to 5 mm in diameter. There is tenderness in the region consistent with positive Murphy's sign. Findings suggest acute cholecystitis, despite the absence of visualization of a shadowing stone.  Common bile duct:  Diameter: Dilated at 7.5 mm. This raises the possibility of choledocholithiasis.  Liver:  Mild intrahepatic ductal dilatation. No focal lesion or generalized abnormality.  In the upper pole the right kidney, there is an echogenic mass measuring 2.6 cm in diameter. This was not visible in 2011 and therefore is worrisome for renal cell carcinoma.  IMPRESSION: Findings consistent with biliary obstruction and  possible cholecystitis. There is mild intrahepatic ductal dilatation and moderate extra hepatic ductal dilatation. The gallbladder contains sludge and shows wall thickening with a positive sonographic Murphy's sign.  2.6 cm in diameter new solid lesion at the upper pole the right kidney worrisome for renal cell carcinoma. If the patient is a good candidate, MRI would be slightly preferable to CT for further evaluation.   Electronically Signed   By: Nelson Chimes M.D.   On: 01/12/2014 11:40     EKG Interpretation   Date/Time:  Wednesday January 12 2014 08:36:04 EDT Ventricular Rate:  62 PR Interval:  144 QRS Duration: 115 QT Interval:  458 QTC Calculation: 465 R Axis:     Text Interpretation:  Sinus rhythm Incomplete RBBB and LAFB Nonspecific T  abnormalities, lateral leads Baseline wander in lead(s) I III aVL No  significant change since last tracing Confirmed by Christy Gentles  MD, [place]  [phone]) on 01/12/2014 8:50:54 AM      MDM   Final diagnoses:  Biliary obstruction    Nursing notes including past medical history and social history reviewed and considered in documentation Labs/vital reviewed and considered     Sharyon Cable, MD 01/12/14 1230

## 2014-01-12 NOTE — ED Notes (Signed)
Bed: PO24 Expected date:  Expected time:  Means of arrival:  Comments: Possible appy

## 2014-01-12 NOTE — H&P (Addendum)
[facility] History and Physical  Jonathan Wilson GGY:[id number] DOB: 1950-10-20 DOA: 01/12/2014  Referring physician: ER physician PCP: [name], MD   Chief Complaint: abd pain   HPI:  Pt is 63 yo male who presents to [facility] ED with main concern of sudden onset of throbbing abd pain, mostly located in RUQ, 10/10 in severity, no specific alleviating factors, worse with eating or drinking, associated with nausea, non bloody vomiting, and poor oral intake. Pt denies similar events in the past, denies fevers, chills, urinary concerns.   In ED, pt noted to be hemodynamically stable, CT abd significant for biliary obstruction with possible choledocholithiasis . THR asked to admit for further evaluation and GI was already consulted.   Assessment & Plan    Active Problems:   Biliary obstruction, choledocholithiasis   Unclear etiology  Will admit to medical floor for further evaluation  Will provide supportive care with IVF, analgesia, antiemetics as needed  Appreciate GI input, will follow upon recommendations    HTN, accelerated  On Atenolol and Lisinopril at home  Will continue BP monitoring    Right kidney mass  Worrisome for RCC  Will request MRI abd w/wo contrast for further evaluation as recommended    DVT prophylaxis  SCD's bilaterally while pt is in hospital   Radiological Exams on Admission:  US Abdomen Limited RUQ  01/12/2014  Findings consistent with biliary obstruction and possible cholecystitis. There is mild intrahepatic ductal dilatation and moderate extra hepatic ductal dilatation. The gallbladder contains sludge and shows wall thickening with a positive sonographic Murphy's sign.  2.6 cm in diameter new solid lesion at the upper pole the right kidney worrisome for renal cell carcinoma. If the patient is a good candidate, MRI would be slightly preferable to CT for further evaluation.    EKG: pending   Code Status: Full Family Communication: Plan of  care discussed with the patient  Disposition Plan: Admit for further evaluation  Leisa Lenz, MD  [facility] Pager [phone]  Review of Systems:  Constitutional: Negative for fever, chills and malaise/fatigue. Negative for diaphoresis.  HENT: Negative for hearing loss, ear pain, neck pain, tinnitus and ear discharge.   Eyes: Negative for blurred vision, double vision, photophobia, pain, discharge and redness.  Respiratory: Negative for cough, hemoptysis, sputum production, shortness of breath, wheezing and stridor.   Cardiovascular: Negative for chest pain, palpitations, orthopnea, claudication and leg swelling.  Gastrointestinal: Negative for heartburn, constipation, blood in stool and melena.  Genitourinary: Negative for dysuria, urgency, frequency, hematuria and flank pain.  Musculoskeletal: Negative for myalgias, back pain, joint pain and falls.  Skin: Negative for itching and rash.  Neurological: Negative for dizziness and weakness.  Endo/Heme/Allergies: Negative for environmental allergies and polydipsia. Does not bruise/bleed easily.  Psychiatric/Behavioral: Negative for suicidal ideas. The patient is not nervous/anxious.      Past Medical History  Diagnosis Date  . Pneumonia     recurrent episodes   . Hypertension   . Hyperlipidemia   . Gout   . Vitamin D deficiency   . Hepatitis C   . Arthritis   . Obesity     300 # in 2010, BMI 34 in 04/2013.   . Right lower lobe lung mass 05/2008    resolved on follow up CT 2012.   [name] Psychosis   . Influenza A 06/2013    acute resp failure, did not require intubation.  . Diverticulosis of colon 2011    seen on CT scan in 2011.  Past Surgical History  Procedure Laterality Date  . Mandible fracture surgery     Social History:  reports that he has been smoking Cigarettes.  He has a 60 pack-year smoking history. He quit smokeless tobacco use about 7 months ago. He reports that he does not drink alcohol or use illicit  drugs.  No Known Allergies  Family History:  Family History  Problem Relation Age of Onset  . Diabetes Mother   . Arthritis Mother   . Cancer Father     pancreatic cancer     Prior to Admission medications   Medication Sig Start Date End Date Taking? Authorizing Provider  allopurinol (ZYLOPRIM) 300 MG tablet Take 300 mg by mouth daily.   Yes Historical Provider, MD  ALPRAZolam Duanne Moron) 1 MG tablet Take 1 mg by mouth 3 (three) times daily as needed for anxiety.   Yes Historical Provider, MD  aspirin EC 81 MG tablet Take 81 mg by mouth daily.   Yes Historical Provider, MD  atenolol (TENORMIN) 100 MG tablet Take 100 mg by mouth at bedtime.   Yes Historical Provider, MD  cholecalciferol (VITAMIN D) 1000 UNITS tablet Take 5,000-10,000 Units by mouth daily.    Yes Historical Provider, MD  citalopram (CELEXA) 40 MG tablet Take 40 mg by mouth daily.   Yes Historical Provider, MD  Cyanocobalamin (VITAMIN B-12 CR PO) Take 1 tablet by mouth daily.   Yes Historical Provider, MD  lisinopril (PRINIVIL,ZESTRIL) 20 MG tablet Take 40 mg by mouth every morning.   Yes Historical Provider, MD  Multiple Vitamin (MULTIVITAMIN WITH MINERALS) TABS tablet Take 1 tablet by mouth every morning.    Yes Historical Provider, MD   Physical Exam: Filed Vitals:   01/12/14 0956 01/12/14 1047 01/12/14 1105 01/12/14 1107  BP: 183/94 160/82  164/75  Pulse: 62 63  67  Temp:  97.9 F (36.6 C)    TempSrc:  Oral    Resp: 19 16  17   SpO2: 95% 97% 79% 96%    Physical Exam  Constitutional: Appears well-developed and well-nourished. No distress.  HENT: Normocephalic. No tonsillar erythema or exudates Eyes: Conjunctivae and EOM are normal. PERRLA, no scleral icterus.  Neck: Normal ROM. Neck supple. No JVD. No tracheal deviation. No thyromegaly.  CVS: RRR, S1/S2 +, no murmurs, no gallops, no carotid bruit.  Pulmonary: Effort and breath sounds normal, no stridor, rhonchi, wheezes, rales.  Abdominal: Soft. BS +,  no  distension, tenderness in upper abd quads R>L, no rebound or guarding.  Musculoskeletal: Normal range of motion. No edema and no tenderness.  Lymphadenopathy: No lymphadenopathy noted, cervical, inguinal. Neuro: Alert. Normal reflexes, muscle tone coordination. No focal neurologic deficits. Skin: Skin is warm and dry. No rash noted. Not diaphoretic. No erythema. No pallor.  Psychiatric: Normal mood and affect. Behavior, judgment, thought content normal.   Labs on Admission:  Basic Metabolic Panel:  Recent Labs Lab 01/12/14 0928  NA 140  K 3.7  CL 103  CO2 24  GLUCOSE 150*  BUN 13  CREATININE 0.79  CALCIUM 9.6   Liver Function Tests:  Recent Labs Lab 01/12/14 0928  AST 17  ALT 14  ALKPHOS 73  BILITOT 0.5  PROT 6.9  ALBUMIN 4.0    Recent Labs Lab 01/12/14 0928  LIPASE 26   CBC:  Recent Labs Lab 01/12/14 0928  WBC 9.5  NEUTROABS 8.3*  HGB 15.8  HCT 44.2  MCV 92.9  PLT 151   If 7PM-7AM, please contact night-coverage [email or web address] Password  [email or web address] 01/12/2014, 12:45 PM

## 2014-01-12 NOTE — Consult Note (Addendum)
[facility] Consult: 1:47 PM 01/12/2014  LOS: 0 days    Referring Provider: Dr Christy Gentles in ED Primary Care Physician:  [name], MD Primary Gastroenterologist:  Dr. Deatra Ina. Not seen in >10 years: no notes in Epic.     Reason for Consultation:     HPI: Jonathan Wilson is a 62 y.o. male.  Obese man with hx Hepatitis C, treated at [facility] to viral remission ~ 09-03-98.  Liver biopsy in Sep 03, 1998 showed grade 2 (mild) inflammation and stage 3 (septal) fibrosis.  Colonoscopy at age 29, unable to locate procedure reports or any pathology.  Apparently this was done at [facility].  Colon diverticulosis by CT in 09-02-09.  Grade 1 diastolic dysfunction on 2D echo 06/2013. Gout. Anxiety.  Pt had been set up for screening colonoscopy by Dr Silas Sacramento 02/09/14, and preop RN visit on 01/26/14.   About 2 weeks ago treated at an urgent care center for heat exhaustion/dehydration with IVF.  Had been working outside in intense heat.  No problems since until this AM.   At 0430 this AM awakened with 5/5 mid abdominal pain, nausea and vomiting over next 32 hours vomited to point of only having dry heaves, pain intensified and spread throughout upper abdomen and chest.  No BMs yet but normally these occur every AM.  Came to ED CT scan today shows: "Findings consistent with biliary obstruction and possible cholecystitis. There is mild intrahepatic ductal dilatation and moderate extra hepatic ductal dilatation. The gallbladder contains sludge and shows wall thickening with a positive sonographic Murphy's sign.  2.6 cm in diameter new solid lesion at the upper pole the right kidney worrisome for renal cell carcinoma".  Other than glucose of 150 his CMET is normal.  WBCs normal.  No anemia.  After Morphine, Dilaudid, IVF sxs have much improved.  Having some  residual RUQ tenderness.  Now hungry.   Father died of liver cancer, mom died of pancreatic cancer.   Past Medical History  Diagnosis Date  . Pneumonia     recurrent episodes   . Hypertension   . Hyperlipidemia   . Gout   . Vitamin D deficiency   . Hepatitis C before September 03, 1998  . Arthritis   . Obesity     300 # in Sep 02, 2008, BMI 34 in 04/2013.   . Right lower lobe lung mass 05/2008    resolved on follow up CT 2010-09-03.   [name] Psychosis 09/03/98    s/e of treatment for Hepatitis C.    . Influenza A 06/2013    acute resp failure, did not require intubation.  . Diverticulosis of colon 2009-09-02    seen on CT scan in 09/02/2009.   [name] Anxiety and depression 03-Sep-1998    worse after death of wife 2013/03/04    Past Surgical History  Procedure Laterality Date  . Mandible fracture surgery      Prior to Admission medications   Medication Sig Start Date End Date Taking? Authorizing Provider  allopurinol (ZYLOPRIM) 300 MG tablet Take 300 mg by mouth daily.   Yes  Historical Provider, MD  ALPRAZolam Duanne Moron) 1 MG tablet Take 1 mg by mouth 3 (three) times daily as needed for anxiety.   Yes Historical Provider, MD  aspirin EC 81 MG tablet Take 81 mg by mouth daily.   Yes Historical Provider, MD  atenolol (TENORMIN) 100 MG tablet Take 100 mg by mouth at bedtime.   Yes Historical Provider, MD  cholecalciferol (VITAMIN D) 1000 UNITS tablet Take 5,000-10,000 Units by mouth daily.    Yes Historical Provider, MD  citalopram (CELEXA) 40 MG tablet Take 40 mg by mouth daily.   Yes Historical Provider, MD  Cyanocobalamin (VITAMIN B-12 CR PO) Take 1 tablet by mouth daily.   Yes Historical Provider, MD  lisinopril (PRINIVIL,ZESTRIL) 20 MG tablet Take 40 mg by mouth every morning.   Yes Historical Provider, MD  Multiple Vitamin (MULTIVITAMIN WITH MINERALS) TABS tablet Take 1 tablet by mouth every morning.    Yes Historical Provider, MD    Scheduled Meds:  Infusions:  PRN Meds: ALPRAZolam, HYDROmorphone (DILAUDID) injection, ondansetron  (ZOFRAN) IV, ondansetron   Allergies as of 01/12/2014  . (No Known Allergies)    Family History  Problem Relation Age of Onset  . Diabetes Mother   . Arthritis Mother   . Cancer Father     pancreatic cancer    History   Social History  . Marital Status: Widowed as of 03-21-2013    Spouse Name: N/A    Number of Children: 3  . Years of Education: N/A   Occupational History  . Employed: Architect work    Social History Main Topics  . Smoking status: Current Every Day Smoker -- 1.50 packs/day for 40 years    Types: Cigarettes  . Smokeless tobacco: Former Systems developer    Quit date: 06/06/2013     Comment: as of 01/2014 smoking 1/2 PPD  . Alcohol Use: Yes     Comment: drinks occasional beer.  "2 dozen beers" over first 8 months of 2015.   . Drug Use: No  . Sexual Activity: No   Social History Narrative   Widowed.  As of 01/2014 is living in appt with his brother-in-law.  Independent of ADLs.    REVIEW OF SYSTEMS: Constitutional:  50 # weight loss in the last year. Has not been taking good care of himself and some anorexia since wife died in Mar 21, 2013.  ENT:  No nose bleeds Pulm:  No new dyspnea.  SOB on long stair climbs CV:  No palpitations, no LE edema. No chest pain. GU:  No hematuria, no frequency.  No tea colored urine GI:  Per HPI Heme:  No anemia   Transfusions:  none Neuro:  No headaches, no peripheral tingling or numbness Psych:  Insomnia, anxiety: takes Xanax TID. Derm:  No itching, no rash or sores.  Endocrine:  No sweats or chills.  No polyuria or dysuria Immunization:  Flu shot March 21, 2009, Td 06/2000 Travel:  None beyond local counties in last few months.    PHYSICAL EXAM: Vital signs in last 24 hours: Filed Vitals:   01/12/14 1257  BP: 157/89  Pulse: 67  Temp: 98.8 F (37.1 C)  Resp: 16   Wt Readings from Last 3 Encounters:  07/16/13 125.646 kg (277 lb)  06/21/13 123.2 kg (271 lb 9.7 oz)  04/19/13 121.473 kg (267 lb 12.8 oz)    General: pleasant,  alert, disheveled appearing but not sick looking WM.  Head:  No asymmetry or swelling  Eyes:  No icterus or pallor Ears:  Not HOH  Nose:  No congestion or discharge Mouth:  Clear and moist Neck:  No mass, no JVD Lungs:  Clear but overall diminished, rough voice c/w smoking Heart: RRR.  No mrg Abdomen:  Soft, ND, minor if any RUQ tenderness.  BS hypoactive.  No HSM or mass, no bruits   Rectal: deferred   Musc/Skeltl: no joint swelling or deformity Extremities:  No CCE .  Feet warm.   Neurologic:  Oriented x3.  No tremor.  No limb weakness Skin:  No telangectasia, sores or rash Tattoos:  none Nodes:  No cervical adenopathy.    Psych:  Pleasant, cooperative, slightly anxious.  Gregarious.   Intake/Output from previous day:   Intake/Output this shift:    LAB RESULTS:  Recent Labs  01/12/14 0928  WBC 9.5  HGB 15.8  HCT 44.2  PLT 151   BMET Lab Results  Component Value Date   NA 140 01/12/2014   NA 139 07/16/2013   NA 132* 06/23/2013   K 3.7 01/12/2014   K 5.2 07/16/2013   K 4.9 06/23/2013   CL 103 01/12/2014   CL 100 07/16/2013   CL 93* 06/23/2013   CO2 24 01/12/2014   CO2 33* 07/16/2013   CO2 24 06/23/2013   GLUCOSE 150* 01/12/2014   GLUCOSE 105* 07/16/2013   GLUCOSE 138* 06/23/2013   BUN 13 01/12/2014   BUN 17 07/16/2013   BUN 72* 06/23/2013   CREATININE 0.79 01/12/2014   CREATININE 0.98 07/16/2013   CREATININE 1.76* 06/23/2013   CALCIUM 9.6 01/12/2014   CALCIUM 9.5 07/16/2013   CALCIUM 9.1 06/23/2013   LFT  Recent Labs  01/12/14 0928  PROT 6.9  ALBUMIN 4.0  AST 17  ALT 14  ALKPHOS 73  BILITOT 0.5   PT/INR No results found for this basename: INR,  PROTIME   Hepatitis Panel No results found for this basename: HEPBSAG, HCVAB, HEPAIGM, HEPBIGM,  in the last 72 hours Lipase     Component Value Date/Time   LIPASE 26 01/12/2014 0928     RADIOLOGY STUDIES: US Abdomen Limited Ruq 01/12/2014   CLINICAL DATA:  Right upper quadrant pain  EXAM: US ABDOMEN LIMITED -  RIGHT UPPER QUADRANT  COMPARISON:  CT abdomen 10/09/2009.  FINDINGS: Gallbladder:  The gallbladder is abnormal, containing sludge in showing wall thickening up to 5 mm in diameter. There is tenderness in the region consistent with positive Murphy's sign. Findings suggest acute cholecystitis, despite the absence of visualization of a shadowing stone.  Common bile duct:  Diameter: Dilated at 7.5 mm. This raises the possibility of choledocholithiasis.  Liver:  Mild intrahepatic ductal dilatation. No focal lesion or generalized abnormality.  In the upper pole the right kidney, there is an echogenic mass measuring 2.6 cm in diameter. This was not visible in 2011 and therefore is worrisome for renal cell carcinoma.  IMPRESSION: Findings consistent with biliary obstruction and possible cholecystitis. There is mild intrahepatic ductal dilatation and moderate extra hepatic ductal dilatation. The gallbladder contains sludge and shows wall thickening with a positive sonographic Murphy's sign.  2.6 cm in diameter new solid lesion at the upper pole the right kidney worrisome for renal cell carcinoma. If the patient is a good candidate, MRI would be slightly preferable to CT for further evaluation.   Electronically Signed   By: Nelson Chimes M.D.   On: 01/12/2014 11:40    ENDOSCOPIC STUDIES: 2005  Colonoscopy At [facility]  IMPRESSION:   *  Acute onset abdominal pain, N/V <  12 hours duration.  Biliary obstruction, sludge and ? Cholecystitis on CT today.   *  Hepatitis C.  Treated with (Ribaviran, interferon?) to "cure" at [facility] (Dr Maceo Pro) ~ 2000.  LFTs normal.  Liver parenchyma on CT today unremarkable.   *  Right kidney lesion on CT scan.   *  Hyperglycemia.  No previous dx of DM.     PLAN:     *  Await surgical opinion re GB and renal mass.  If evaluation of bile duct needed, would get MRCP.    Azucena Freed  01/12/2014, 1:47 PM Pager: [phone]     Attending MD note:   I have taken a history, examined the  patient, and reviewed the chart. I agree with the Advanced Practitioner's impression and recommendations. Acute upper abd pain lasted about 5 hours, occurred 5 hours following meal  ( pizza), now better but pain is coming back. Exam is consistent with acute cholecystitis ( rebound RUQ, ileus. Imaging c/w chronic cholecystitis, sludge, no definite choledocholithiasis, LFT's were normal early today  but will repeat now. ?? Renal mass, may have to be dealt with separately from acute abdominal pain.. Would start empirical antibiotics for presumed cholecystitis. Surgical opinion pending. Question of whether MRCP/ERCP needed. Hep  C- 2000 treated with PEG Interferon x 48 weeks.at [facility] by Dr Maceo Pro- successful erradication according to the pt.  [facility] Pager # [phone]

## 2014-01-12 NOTE — ED Notes (Signed)
He states he was awakened at about 5am this morning with intense periumbilical pain with n/v.  He states both abd. Pain and nausea persist, in spite of IV Zofran given by EMS.

## 2014-01-12 NOTE — ED Notes (Signed)
Patient is unable to give urine sample due to him still feeling like he is nauseated.

## 2014-01-12 NOTE — ED Notes (Signed)
Patient will try to urinate, wants me to come back in 20 minutes

## 2014-01-12 NOTE — ED Notes (Signed)
He remains comfortable; and has just spoken with Dr. Christy Gentles.

## 2014-01-13 ENCOUNTER — Encounter (HOSPITAL_COMMUNITY): Payer: Self-pay | Admitting: General Surgery

## 2014-01-13 DIAGNOSIS — R112 Nausea with vomiting, unspecified: Secondary | ICD-10-CM

## 2014-01-13 DIAGNOSIS — K810 Acute cholecystitis: Secondary | ICD-10-CM

## 2014-01-13 DIAGNOSIS — N289 Disorder of kidney and ureter, unspecified: Secondary | ICD-10-CM

## 2014-01-13 DIAGNOSIS — I10 Essential (primary) hypertension: Secondary | ICD-10-CM

## 2014-01-13 DIAGNOSIS — R1011 Right upper quadrant pain: Secondary | ICD-10-CM

## 2014-01-13 LAB — GLUCOSE, CAPILLARY: Glucose-Capillary: 93 mg/dL (ref 70–99)

## 2014-01-13 LAB — COMPREHENSIVE METABOLIC PANEL
ALT: 88 U/L — ABNORMAL HIGH (ref 0–53)
AST: 100 U/L — ABNORMAL HIGH (ref 0–37)
Albumin: 3.5 g/dL (ref 3.5–5.2)
Alkaline Phosphatase: 103 U/L (ref 39–117)
Anion gap: 12 (ref 5–15)
BUN: 10 mg/dL (ref 6–23)
CALCIUM: 9.1 mg/dL (ref 8.4–10.5)
CO2: 27 meq/L (ref 19–32)
CREATININE: 0.85 mg/dL (ref 0.50–1.35)
Chloride: 101 mEq/L (ref 96–112)
GLUCOSE: 90 mg/dL (ref 70–99)
Potassium: 4.8 mEq/L (ref 3.7–5.3)
Sodium: 140 mEq/L (ref 137–147)
Total Bilirubin: 0.6 mg/dL (ref 0.3–1.2)
Total Protein: 6.5 g/dL (ref 6.0–8.3)

## 2014-01-13 LAB — CBC
HCT: 42.4 % (ref 39.0–52.0)
Hemoglobin: 14.2 g/dL (ref 13.0–17.0)
MCH: 32.5 pg (ref 26.0–34.0)
MCHC: 33.5 g/dL (ref 30.0–36.0)
MCV: 97 fL (ref 78.0–100.0)
Platelets: 132 10*3/uL — ABNORMAL LOW (ref 150–400)
RBC: 4.37 MIL/uL (ref 4.22–5.81)
RDW: 13.7 % (ref 11.5–15.5)
WBC: 6.6 10*3/uL (ref 4.0–10.5)

## 2014-01-13 LAB — SURGICAL PCR SCREEN
MRSA, PCR: NEGATIVE
STAPHYLOCOCCUS AUREUS: POSITIVE — AB

## 2014-01-13 LAB — TSH: TSH: 0.869 u[IU]/mL (ref 0.350–4.500)

## 2014-01-13 MED ORDER — CIPROFLOXACIN IN D5W 400 MG/200ML IV SOLN
400.0000 mg | Freq: Two times a day (BID) | INTRAVENOUS | Status: DC
  Filled 2014-01-13: qty 200

## 2014-01-13 MED ORDER — PIPERACILLIN-TAZOBACTAM 3.375 G IVPB
3.3750 g | Freq: Three times a day (TID) | INTRAVENOUS | Status: DC
  Administered 2014-01-13 – 2014-01-15 (×7): 3.375 g via INTRAVENOUS
  Filled 2014-01-13 (×9): qty 50

## 2014-01-13 MED ORDER — HYDRALAZINE HCL 20 MG/ML IJ SOLN: 10.0000 mg | Freq: Four times a day (QID) | INTRAMUSCULAR | Status: DC | PRN

## 2014-01-13 MED ORDER — DEXTROSE 5 % IV SOLN
2.0000 g | INTRAVENOUS | Status: DC
  Filled 2014-01-13: qty 2

## 2014-01-13 MED ORDER — NICOTINE 7 MG/24HR TD PT24
7.0000 mg | MEDICATED_PATCH | Freq: Every day | TRANSDERMAL | Status: DC
  Filled 2014-01-13 (×5): qty 1

## 2014-01-13 MED ORDER — ALBUTEROL SULFATE (2.5 MG/3ML) 0.083% IN NEBU: 2.5000 mg | INHALATION_SOLUTION | Freq: Four times a day (QID) | RESPIRATORY_TRACT | Status: DC | PRN

## 2014-01-13 NOTE — Consult Note (Signed)
Jonathan Wilson 1951-01-25  [id number].   Requesting MD: Dr. Delfin Edis Chief Complaint/Reason for Consult: Cholecystitis HPI: This is a 63 year old white male with a history of hepatitis C for which he was treated and has had no detectable virus in the last 15+ years. He woke up yesterday morning around 4:30 AM with severe epigastric and right upper quadrant abdominal pain. He developed nausea and vomiting. His pain continued to worsen. He denied any fevers or chills. He came to [facility] emergency department where he was evaluated and found to have gallbladder wall thickening on ultrasound consistent with cholecystitis. The patient was admitted. Gastroenterology was initially consulted however we were then consulted for further surgical recommendations.  ROS: Please see history of present illness otherwise all other systems have been reviewed and are currently negative  Family History  Problem Relation Age of Onset  . Diabetes Mother   . Arthritis Mother   . Cancer Father     pancreatic cancer    Past Medical History  Diagnosis Date  . Pneumonia     recurrent episodes   . Hypertension   . Hyperlipidemia   . Gout   . Vitamin D deficiency   . Hepatitis C before Aug 08, 1998  . Arthritis   . Obesity     300 # in 2008-08-08, BMI 34 in 04/2013.   . Right lower lobe lung mass 05/2008    resolved on follow up CT 08-Aug-2010.   [name] Psychosis 1998-08-08    s/e of treatment for Hepatitis C.    . Influenza A 06/2013    acute resp failure, did not require intubation.  . Diverticulosis of colon 08-08-2009    seen on CT scan in 2009/08/08.   [name] Anxiety and depression 1998-08-08    worse after death of wife 04-07-13    Past Surgical History  Procedure Laterality Date  . Mandible fracture surgery      Social History:  reports that he has been smoking Cigarettes.  He has a 10 pack-year smoking history. He quit smokeless tobacco use about 7 months ago. He reports that he drinks alcohol. He reports that he does not use illicit  drugs.  Allergies: No Known Allergies  Medications Prior to Admission  Medication Sig Dispense Refill  . allopurinol (ZYLOPRIM) 300 MG tablet Take 300 mg by mouth daily.      [name] ALPRAZolam (XANAX) 1 MG tablet Take 1 mg by mouth 3 (three) times daily as needed for anxiety.      [name] aspirin EC 81 MG tablet Take 81 mg by mouth daily.      [name] atenolol (TENORMIN) 100 MG tablet Take 100 mg by mouth at bedtime.      . cholecalciferol (VITAMIN D) 1000 UNITS tablet Take 5,000-10,000 Units by mouth daily.       . citalopram (CELEXA) 40 MG tablet Take 40 mg by mouth daily.      . Cyanocobalamin (VITAMIN B-12 CR PO) Take 1 tablet by mouth daily.      [name] lisinopril (PRINIVIL,ZESTRIL) 20 MG tablet Take 40 mg by mouth every morning.      . Multiple Vitamin (MULTIVITAMIN WITH MINERALS) TABS tablet Take 1 tablet by mouth every morning.         Blood pressure 126/70, pulse 47, temperature 98.1 F (36.7 C), temperature source Oral, resp. rate 17, height 6' 2.5" (1.892 m), weight 244 lb 4.3 oz (110.8 kg), SpO2 93.00%. Physical Exam: General: pleasant, WD, WN white male who is laying  in bed in NAD HEENT: head is normocephalic, atraumatic.  Sclera are noninjected.  PERRL.  Ears and nose without any masses or lesions.  Mouth is pink and moist Heart: regular, rate, and rhythm.  Normal s1,s2. No obvious murmurs, gallops, or rubs noted.  Palpable radial and pedal pulses bilaterally Lungs: CTAB, no wheezes, rhonchi, or rales noted.  Respiratory effort nonlabored Abd: soft, tender in the right upper quadrant with a positive Murphy sign, ND, +BS, no masses, hernias, or organomegaly MS: all 4 extremities are symmetrical with no cyanosis, clubbing, or edema. Skin: warm and dry with no masses, lesions, or rashes Psych: A&Ox3 with an appropriate affect.    Results for orders placed during the hospital encounter of 01/12/14 (from the past 48 hour(s))  COMPREHENSIVE METABOLIC PANEL     Status: Abnormal   Collection Time     01/12/14  9:28 AM      Result Value Ref Range   Sodium 140  137 - 147 mEq/L   Potassium 3.7  3.7 - 5.3 mEq/L   Chloride 103  96 - 112 mEq/L   CO2 24  19 - 32 mEq/L   Glucose, Bld 150 (*) 70 - 99 mg/dL   BUN 13  6 - 23 mg/dL   Creatinine, Ser 0.79  0.50 - 1.35 mg/dL   Calcium 9.6  8.4 - 10.5 mg/dL   Total Protein 6.9  6.0 - 8.3 g/dL   Albumin 4.0  3.5 - 5.2 g/dL   AST 17  0 - 37 U/L   ALT 14  0 - 53 U/L   Alkaline Phosphatase 73  39 - 117 U/L   Total Bilirubin 0.5  0.3 - 1.2 mg/dL   GFR calc non Af Amer >90  >90 mL/min   GFR calc Af Amer >90  >90 mL/min   Comment: (NOTE)     The eGFR has been calculated using the CKD EPI equation.     This calculation has not been validated in all clinical situations.     eGFR's persistently <90 mL/min signify possible Chronic Kidney     Disease.   Anion gap 13  5 - 15  CBC WITH DIFFERENTIAL     Status: Abnormal   Collection Time    01/12/14  9:28 AM      Result Value Ref Range   WBC 9.5  4.0 - 10.5 K/uL   RBC 4.76  4.22 - 5.81 MIL/uL   Hemoglobin 15.8  13.0 - 17.0 g/dL   HCT 44.2  39.0 - 52.0 %   MCV 92.9  78.0 - 100.0 fL   MCH 33.2  26.0 - 34.0 pg   MCHC 35.7  30.0 - 36.0 g/dL   RDW 13.2  11.5 - 15.5 %   Platelets 151  150 - 400 K/uL   Neutrophils Relative % 87 (*) 43 - 77 %   Neutro Abs 8.3 (*) 1.7 - 7.7 K/uL   Lymphocytes Relative 8 (*) 12 - 46 %   Lymphs Abs 0.8  0.7 - 4.0 K/uL   Monocytes Relative 4  3 - 12 %   Monocytes Absolute 0.4  0.1 - 1.0 K/uL   Eosinophils Relative 1  0 - 5 %   Eosinophils Absolute 0.1  0.0 - 0.7 K/uL   Basophils Relative 0  0 - 1 %   Basophils Absolute 0.0  0.0 - 0.1 K/uL  LIPASE, BLOOD     Status: None   Collection Time    01/12/14  9:28 AM      Result Value Ref Range   Lipase 26  11 - 59 U/L  HEPATIC FUNCTION PANEL     Status: None   Collection Time    01/12/14  9:28 AM      Result Value Ref Range   Total Protein 7.1  6.0 - 8.3 g/dL   Albumin 4.1  3.5 - 5.2 g/dL   AST 20  0 - 37 U/L   ALT 16   0 - 53 U/L   Alkaline Phosphatase 74  39 - 117 U/L   Total Bilirubin 0.5  0.3 - 1.2 mg/dL   Bilirubin, Direct <0.2  0.0 - 0.3 mg/dL   Indirect Bilirubin NOT CALCULATED  0.3 - 0.9 mg/dL  Randolm Idol, ED     Status: None   Collection Time    01/12/14  9:35 AM      Result Value Ref Range   Troponin i, poc 0.00  0.00 - 0.08 ng/mL   Comment 3            Comment: Due to the release kinetics of cTnI,     a negative result within the first hours     of the onset of symptoms does not rule out     myocardial infarction with certainty.     If myocardial infarction is still suspected,     repeat the test at appropriate intervals.  I-STAT CG4 LACTIC ACID, ED     Status: None   Collection Time    01/12/14  9:38 AM      Result Value Ref Range   Lactic Acid, Venous 2.10  0.5 - 2.2 mmol/L  URINALYSIS, ROUTINE W REFLEX MICROSCOPIC     Status: None   Collection Time    01/12/14  9:56 AM      Result Value Ref Range   Color, Urine YELLOW  YELLOW   APPearance CLEAR  CLEAR   Specific Gravity, Urine 1.017  1.005 - 1.030   pH 6.5  5.0 - 8.0   Glucose, UA NEGATIVE  NEGATIVE mg/dL   Hgb urine dipstick NEGATIVE  NEGATIVE   Bilirubin Urine NEGATIVE  NEGATIVE   Ketones, ur NEGATIVE  NEGATIVE mg/dL   Protein, ur NEGATIVE  NEGATIVE mg/dL   Urobilinogen, UA 0.2  0.0 - 1.0 mg/dL   Nitrite NEGATIVE  NEGATIVE   Leukocytes, UA NEGATIVE  NEGATIVE   Comment: MICROSCOPIC NOT DONE ON URINES WITH NEGATIVE PROTEIN, BLOOD, LEUKOCYTES, NITRITE, OR GLUCOSE <1000 mg/dL.  COMPREHENSIVE METABOLIC PANEL     Status: Abnormal   Collection Time    01/13/14  4:50 AM      Result Value Ref Range   Sodium 140  137 - 147 mEq/L   Potassium 4.8  3.7 - 5.3 mEq/L   Comment: MODERATE HEMOLYSIS     HEMOLYSIS AT THIS LEVEL MAY AFFECT RESULT     DELTA CHECK NOTED     REPEATED TO VERIFY   Chloride 101  96 - 112 mEq/L   CO2 27  19 - 32 mEq/L   Glucose, Bld 90  70 - 99 mg/dL   BUN 10  6 - 23 mg/dL   Creatinine, Ser 0.85  0.50  - 1.35 mg/dL   Calcium 9.1  8.4 - 10.5 mg/dL   Total Protein 6.5  6.0 - 8.3 g/dL   Albumin 3.5  3.5 - 5.2 g/dL   AST 100 (*) 0 - 37 U/L   Comment: MODERATE HEMOLYSIS  HEMOLYSIS AT THIS LEVEL MAY AFFECT RESULT   ALT 88 (*) 0 - 53 U/L   Comment: MODERATE HEMOLYSIS     HEMOLYSIS AT THIS LEVEL MAY AFFECT RESULT   Alkaline Phosphatase 103  39 - 117 U/L   Comment: MODERATE HEMOLYSIS     HEMOLYSIS AT THIS LEVEL MAY AFFECT RESULT   Total Bilirubin 0.6  0.3 - 1.2 mg/dL   GFR calc non Af Amer >90  >90 mL/min   GFR calc Af Amer >90  >90 mL/min   Comment: (NOTE)     The eGFR has been calculated using the CKD EPI equation.     This calculation has not been validated in all clinical situations.     eGFR's persistently <90 mL/min signify possible Chronic Kidney     Disease.   Anion gap 12  5 - 15  CBC     Status: Abnormal   Collection Time    01/13/14  4:50 AM      Result Value Ref Range   WBC 6.6  4.0 - 10.5 K/uL   RBC 4.37  4.22 - 5.81 MIL/uL   Hemoglobin 14.2  13.0 - 17.0 g/dL   HCT 42.4  39.0 - 52.0 %   MCV 97.0  78.0 - 100.0 fL   MCH 32.5  26.0 - 34.0 pg   MCHC 33.5  30.0 - 36.0 g/dL   RDW 13.7  11.5 - 15.5 %   Platelets 132 (*) 150 - 400 K/uL  GLUCOSE, CAPILLARY     Status: None   Collection Time    01/13/14  7:33 AM      Result Value Ref Range   Glucose-Capillary 93  70 - 99 mg/dL   US Abdomen Limited Ruq  01/12/2014   CLINICAL DATA:  Right upper quadrant pain  EXAM: US ABDOMEN LIMITED - RIGHT UPPER QUADRANT  COMPARISON:  CT abdomen 10/09/2009.  FINDINGS: Gallbladder:  The gallbladder is abnormal, containing sludge in showing wall thickening up to 5 mm in diameter. There is tenderness in the region consistent with positive Murphy's sign. Findings suggest acute cholecystitis, despite the absence of visualization of a shadowing stone.  Common bile duct:  Diameter: Dilated at 7.5 mm. This raises the possibility of choledocholithiasis.  Liver:  Mild intrahepatic ductal dilatation. No  focal lesion or generalized abnormality.  In the upper pole the right kidney, there is an echogenic mass measuring 2.6 cm in diameter. This was not visible in 2011 and therefore is worrisome for renal cell carcinoma.  IMPRESSION: Findings consistent with biliary obstruction and possible cholecystitis. There is mild intrahepatic ductal dilatation and moderate extra hepatic ductal dilatation. The gallbladder contains sludge and shows wall thickening with a positive sonographic Murphy's sign.  2.6 cm in diameter new solid lesion at the upper pole the right kidney worrisome for renal cell carcinoma. If the patient is a good candidate, MRI would be slightly preferable to CT for further evaluation.   Electronically Signed   By: Nelson Chimes M.D.   On: 01/12/2014 11:40   Assessment/Plan 1. Acute cholecystitis 2. History of hepatitis C, resolved 3.  Has right renal lesion - I gave him a copy of the [place] report.  This will need follow up.  He did have an [place] in Jan 2015, which showed this lesion, but he does not remember the [place].  Plan: 1. The patient is currently n.p.o. We will try taken to the operating room later today for cholecystectomy. If time rundown, we will  plan on proceeding with surgical intervention tomorrow. This has been discussed with the patient. He understands and is agreeable to proceed. Agree with Cipro.  OSBORNE,KELLY E 01/13/2014, 10:02 AM Pager: [phone]  Agree with above. Unfortunately, his case was bumped today because of other emergencies. Note, his wife died earlier this year. I plan to do his surgery Friday, 8/14.  Alphonsa Overall, MD, [facility] Pager: [phone] Office phone:  [phone]

## 2014-01-13 NOTE — Progress Notes (Addendum)
[facility] PROGRESS NOTE  Jonathan Wilson TDD:[id number] DOB: 05/03/1951 DOA: 01/12/2014 PCP: [name], MD   Brief narrative  63 year old obese male with history of hep C, treated in the past, hypertension, gout, history of recurrent pneumonia was in to the ED with acute onset severe epigastric and right upper quadrant abdominal pain associated with dry heaving. Patient presented to the [facility] long ED where he had workup done with ultrasound of the abdomen which showed biliary obstruction and possible cholecystitis. Admitted to hospitalist service. GI and surgery consulted. Also an incidental 2.6 cm new solid lesion in the upper pole of right kidney was noted on ultrasound worrisome for renal cell carcinoma.  Assessment/Plan: Acute cholecystitis Seen by GI and surgery we plan on laparoscopic cholecystectomy possibly later today. Condition n.p.o. continue IV fluids and pain control with when necessary Dilaudid. Continue when necessary Zosyn.  Uncontrolled hypertension Continue home blood pressure medications. Will add when necessary hydralazine  Right kidney mass Incidental finding on ultrasound of the abdomen. We need MRI of the abdomen during hospitalization after surgery.  Tobacco abuse Counseled on cessation. We ordered nicotine patch.  History of gout Continue allopurinol.  EVT prophylaxis: Subcutaneous Lovenox  Code Status: Full code Family Communication: None at bedside Disposition Plan: Home once stable   Consultants:  GI  Surgery  Procedures:  None  Antibiotics:  IV Zosyn  HPI/Subjective: Patient reports his abdominal pain to be controlled on IV Dilaudid. Denies nausea or vomiting  Objective: Filed Vitals:   01/13/14 0539  BP: 126/70  Pulse: 47  Temp: 98.1 F (36.7 C)  Resp: 17    Intake/Output Summary (Last 24 hours) at 01/13/14 1325 Last data filed at 01/13/14 0700  Gross per 24 hour  Intake 1911.83 ml  Output   1900 ml  Net   11.83 ml   Filed Weights   01/12/14 1808  Weight: 110.8 kg (244 lb 4.3 oz)    Exam:   General:  Related to be dealing with a distress  HEENT: No pallor, no icterus, moist oral mucosa  Cardiovascular: Normal S1 and S2, no murmurs  Respiratory: Scattered rhonchi bilaterally  Abdomen: Soft, nondistended, right upper quadrant tenderness, bowel sounds present  Musculoskeletal: Warm, no edema  CNS: Alert and oriented  Data Reviewed: Basic Metabolic Panel:  Recent Labs Lab 01/12/14 0928 01/13/14 0450  NA 140 140  K 3.7 4.8  CL 103 101  CO2 24 27  GLUCOSE 150* 90  BUN 13 10  CREATININE 0.79 0.85  CALCIUM 9.6 9.1   Liver Function Tests:  Recent Labs Lab 01/12/14 0928 01/13/14 0450  AST 17  20 100*  ALT 14  16 88*  ALKPHOS 73  74 103  BILITOT 0.5  0.5 0.6  PROT 6.9  7.1 6.5  ALBUMIN 4.0  4.1 3.5    Recent Labs Lab 01/12/14 0928  LIPASE 26   No results found for this basename: AMMONIA,  in the last 168 hours CBC:  Recent Labs Lab 01/12/14 0928 01/13/14 0450  WBC 9.5 6.6  NEUTROABS 8.3*  --   HGB 15.8 14.2  HCT 44.2 42.4  MCV 92.9 97.0  PLT 151 132*   Cardiac Enzymes: No results found for this basename: CKTOTAL, CKMB, CKMBINDEX, TROPONINI,  in the last 168 hours BNP (last 3 results)  Recent Labs  06/21/13 0908  PROBNP 2813.0*   CBG:  Recent Labs Lab 01/13/14 0733  GLUCAP 93    No results found for this or any previous visit (  from the past 240 hour(s)).   Studies: US Abdomen Limited Ruq  January 31, 2014   CLINICAL DATA:  Right upper quadrant pain  EXAM: US ABDOMEN LIMITED - RIGHT UPPER QUADRANT  COMPARISON:  CT abdomen 10/09/2009.  FINDINGS: Gallbladder:  The gallbladder is abnormal, containing sludge in showing wall thickening up to 5 mm in diameter. There is tenderness in the region consistent with positive Murphy's sign. Findings suggest acute cholecystitis, despite the absence of visualization of a shadowing stone.  Common bile  duct:  Diameter: Dilated at 7.5 mm. This raises the possibility of choledocholithiasis.  Liver:  Mild intrahepatic ductal dilatation. No focal lesion or generalized abnormality.  In the upper pole the right kidney, there is an echogenic mass measuring 2.6 cm in diameter. This was not visible in 2011 and therefore is worrisome for renal cell carcinoma.  IMPRESSION: Findings consistent with biliary obstruction and possible cholecystitis. There is mild intrahepatic ductal dilatation and moderate extra hepatic ductal dilatation. The gallbladder contains sludge and shows wall thickening with a positive sonographic Murphy's sign.  2.6 cm in diameter new solid lesion at the upper pole the right kidney worrisome for renal cell carcinoma. If the patient is a good candidate, MRI would be slightly preferable to CT for further evaluation.   Electronically Signed   By: Nelson Chimes M.D.   On: 2014-01-31 11:40    Scheduled Meds: . allopurinol  300 mg Oral Daily  . atenolol  100 mg Oral QHS  . cholecalciferol  5,000 Units Oral Daily  . citalopram  40 mg Oral Daily  . lisinopril  40 mg Oral q morning - 10a  . multivitamin with minerals  1 tablet Oral q morning - 10a  . piperacillin-tazobactam (ZOSYN)  IV  3.375 g Intravenous 3 times per day   Continuous Infusions: . sodium chloride 100 mL/hr (01/13/14 0307)      Time spent: 25 minutes    [name]  [facility] Pager [phone] If 7PM-7AM, please contact night-coverage at [email or web address], password [email or web address] 01/13/2014, 1:25 PM  LOS: 1 day

## 2014-01-13 NOTE — Progress Notes (Signed)
    Progress Note   Subjective  starving but otherwise comfortable   Objective   Vital signs in last 24 hours: Temp:  [97.9 F (36.6 C)-98.8 F (37.1 C)] 98.1 F (36.7 C) (08/13 0539) Pulse Rate:  [47-86] 47 (08/13 0539) Resp:  [16-19] 17 (08/13 0539) BP: (115-183)/(70-100) 126/70 mmHg (08/13 0539) SpO2:  [79 %-100 %] 93 % (08/13 0539) Weight:  [244 lb 4.3 oz (110.8 kg)] 244 lb 4.3 oz (110.8 kg) (08/12 1808)   General:    white male in NAD Lungs: Respirations even and unlabored, lungs CTA bilaterally Abdomen:  Soft, nondistended moderate RUQ tenderness. Normal bowel sounds. Extremities:  Without edema. Neurologic:  Alert and oriented,  grossly normal neurologically. Psych:  Cooperative. Normal mood and affect.   Lab Results:  Recent Labs  01/12/14 0928 01/13/14 0450  WBC 9.5 6.6  HGB 15.8 14.2  HCT 44.2 42.4  PLT 151 132*   BMET  Recent Labs  01/12/14 0928 01/13/14 0450  NA 140 140  K 3.7 4.8  CL 103 101  CO2 24 27  GLUCOSE 150* 90  BUN 13 10  CREATININE 0.79 0.85  CALCIUM 9.6 9.1   LFT  Recent Labs  01/12/14 0928 01/13/14 0450  PROT 6.9  7.1 6.5  ALBUMIN 4.0  4.1 3.5  AST 17  20 100*  ALT 14  16 88*  ALKPHOS 73  74 103  BILITOT 0.5  0.5 0.6  BILIDIR <0.2  --   IBILI NOT CALCULATED  --      Assessment / Plan:   39. 63 year old male with acute abdominal pain, nausea, vomiting and ultrasound suggestive of biliary obstruction  acute cholecystitis. Transaminases elevated this am but no evidence for cholestasis by labs. Getting IV Cipro for possible cholecystitis. Surgery has evaluated and planning for cholecystectomy, probably this afternoon  2. HTN, markedly elevated in last 24 hours.   3. Right renal mass, rule out RCC. For eventual MRI   LOS: 1 day   Jonathan Wilson  01/13/2014, 9:06 AM  Attending MD note:   I have taken a history, examined the patient, and reviewed the chart. I agree with the Advanced Practitioner's impression and  recommendations. Apprerciate surgical consult for cholecystectomy later today. Follow LFT's.  [facility] Pager # [phone]

## 2014-01-13 NOTE — Progress Notes (Signed)
Night nurse reported that pt has redness and welts on arm that IV was running in. Cipro was running. Arm looks better this am. Possible allergy to Cipro.

## 2014-01-14 ENCOUNTER — Encounter: Payer: Self-pay | Admitting: Internal Medicine

## 2014-01-14 ENCOUNTER — Encounter (HOSPITAL_COMMUNITY): Payer: BC Managed Care – PPO | Admitting: Anesthesiology

## 2014-01-14 ENCOUNTER — Encounter (HOSPITAL_COMMUNITY)
Admission: EM | Disposition: A | Discharge status: Home / Self Care | Payer: Self-pay | Source: Home / Self Care | Attending: Internal Medicine

## 2014-01-14 ENCOUNTER — Inpatient Hospital Stay (HOSPITAL_COMMUNITY): Payer: BC Managed Care – PPO | Admitting: Anesthesiology

## 2014-01-14 ENCOUNTER — Inpatient Hospital Stay (HOSPITAL_COMMUNITY): Payer: BC Managed Care – PPO

## 2014-01-14 DIAGNOSIS — K810 Acute cholecystitis: Secondary | ICD-10-CM

## 2014-01-14 DIAGNOSIS — K8066 Calculus of gallbladder and bile duct with acute and chronic cholecystitis without obstruction: Secondary | ICD-10-CM

## 2014-01-14 HISTORY — PX: CHOLECYSTECTOMY: SHX55

## 2014-01-14 LAB — GLUCOSE, CAPILLARY: GLUCOSE-CAPILLARY: 99 mg/dL (ref 70–99)

## 2014-01-14 SURGERY — LAPAROSCOPIC CHOLECYSTECTOMY WITH INTRAOPERATIVE CHOLANGIOGRAM
Anesthesia: General

## 2014-01-14 MED ORDER — ONDANSETRON HCL 4 MG/2ML IJ SOLN
INTRAMUSCULAR | Status: AC
  Filled 2014-01-14: qty 2

## 2014-01-14 MED ORDER — LACTATED RINGERS IV SOLN: INTRAVENOUS | Status: DC

## 2014-01-14 MED ORDER — DIATRIZOATE MEGLUMINE 30 % UR SOLN
URETHRAL | Status: DC | PRN
  Administered 2014-01-14: 7 mL via URETHRAL

## 2014-01-14 MED ORDER — PROPOFOL 10 MG/ML IV BOLUS
INTRAVENOUS | Status: AC
  Filled 2014-01-14: qty 20

## 2014-01-14 MED ORDER — MIDAZOLAM HCL 2 MG/2ML IJ SOLN
INTRAMUSCULAR | Status: AC
  Filled 2014-01-14: qty 2

## 2014-01-14 MED ORDER — FENTANYL CITRATE 0.05 MG/ML IJ SOLN
INTRAMUSCULAR | Status: DC | PRN
  Administered 2014-01-14 (×2): 50 ug via INTRAVENOUS

## 2014-01-14 MED ORDER — SODIUM CHLORIDE 0.9 % IJ SOLN
INTRAMUSCULAR | Status: AC
  Filled 2014-01-14: qty 10

## 2014-01-14 MED ORDER — HYDROMORPHONE HCL PF 1 MG/ML IJ SOLN
INTRAMUSCULAR | Status: AC
  Administered 2014-01-14: 1 mg
  Filled 2014-01-14: qty 1

## 2014-01-14 MED ORDER — BUPIVACAINE HCL (PF) 0.25 % IJ SOLN
INTRAMUSCULAR | Status: AC
  Filled 2014-01-14: qty 30

## 2014-01-14 MED ORDER — PIPERACILLIN-TAZOBACTAM 3.375 G IVPB
INTRAVENOUS | Status: AC
  Filled 2014-01-14: qty 50

## 2014-01-14 MED ORDER — GLYCOPYRROLATE 0.2 MG/ML IJ SOLN
INTRAMUSCULAR | Status: AC
  Filled 2014-01-14: qty 5

## 2014-01-14 MED ORDER — ONDANSETRON HCL 4 MG/2ML IJ SOLN
INTRAMUSCULAR | Status: DC | PRN
  Administered 2014-01-14: 4 mg via INTRAVENOUS

## 2014-01-14 MED ORDER — DEXAMETHASONE SODIUM PHOSPHATE 10 MG/ML IJ SOLN
INTRAMUSCULAR | Status: DC | PRN
  Administered 2014-01-14: 10 mg via INTRAVENOUS

## 2014-01-14 MED ORDER — 0.9 % SODIUM CHLORIDE (POUR BTL) OPTIME
TOPICAL | Status: DC | PRN
  Administered 2014-01-14: 1000 mL

## 2014-01-14 MED ORDER — LACTATED RINGERS IV SOLN
INTRAVENOUS | Status: DC | PRN
  Administered 2014-01-14: 1000 mL

## 2014-01-14 MED ORDER — FENTANYL CITRATE 0.05 MG/ML IJ SOLN
INTRAMUSCULAR | Status: AC
  Filled 2014-01-14: qty 5

## 2014-01-14 MED ORDER — HYDROMORPHONE HCL PF 1 MG/ML IJ SOLN
0.2500 mg | INTRAMUSCULAR | Status: DC | PRN
  Administered 2014-01-14 (×2): 0.5 mg via INTRAVENOUS

## 2014-01-14 MED ORDER — DEXAMETHASONE SODIUM PHOSPHATE 10 MG/ML IJ SOLN
INTRAMUSCULAR | Status: AC
  Filled 2014-01-14: qty 1

## 2014-01-14 MED ORDER — LACTATED RINGERS IV SOLN
INTRAVENOUS | Status: AC
  Administered 2014-01-14: 75 mL/h via INTRAVENOUS

## 2014-01-14 MED ORDER — LACTATED RINGERS IV SOLN
INTRAVENOUS | Status: DC
  Administered 2014-01-14: 1000 mL via INTRAVENOUS
  Administered 2014-01-14 (×2): via INTRAVENOUS

## 2014-01-14 MED ORDER — NEOSTIGMINE METHYLSULFATE 10 MG/10ML IV SOLN
INTRAVENOUS | Status: AC
  Filled 2014-01-14: qty 1

## 2014-01-14 MED ORDER — LIDOCAINE HCL (CARDIAC) 20 MG/ML IV SOLN
INTRAVENOUS | Status: DC | PRN
  Administered 2014-01-14: 100 mg via INTRAVENOUS

## 2014-01-14 MED ORDER — SUFENTANIL CITRATE 50 MCG/ML IV SOLN
INTRAVENOUS | Status: DC | PRN
  Administered 2014-01-14 (×2): 5 ug via INTRAVENOUS
  Administered 2014-01-14: 10 ug via INTRAVENOUS

## 2014-01-14 MED ORDER — CHLORHEXIDINE GLUCONATE CLOTH 2 % EX PADS
6.0000 | MEDICATED_PAD | Freq: Every day | CUTANEOUS | Status: DC
  Administered 2014-01-14: 6 via TOPICAL

## 2014-01-14 MED ORDER — SUFENTANIL CITRATE 50 MCG/ML IV SOLN
INTRAVENOUS | Status: AC
  Filled 2014-01-14: qty 1

## 2014-01-14 MED ORDER — EPHEDRINE SULFATE 50 MG/ML IJ SOLN
INTRAMUSCULAR | Status: DC | PRN
  Administered 2014-01-14 (×2): 10 mg via INTRAVENOUS

## 2014-01-14 MED ORDER — MIDAZOLAM HCL 5 MG/5ML IJ SOLN
INTRAMUSCULAR | Status: DC | PRN
  Administered 2014-01-14: 2 mg via INTRAVENOUS

## 2014-01-14 MED ORDER — SUCCINYLCHOLINE CHLORIDE 20 MG/ML IJ SOLN
INTRAMUSCULAR | Status: DC | PRN
  Administered 2014-01-14: 100 mg via INTRAVENOUS

## 2014-01-14 MED ORDER — PROPOFOL 10 MG/ML IV BOLUS
INTRAVENOUS | Status: DC | PRN
  Administered 2014-01-14: 200 mg via INTRAVENOUS
  Administered 2014-01-14: 30 mg via INTRAVENOUS

## 2014-01-14 MED ORDER — MORPHINE SULFATE 2 MG/ML IJ SOLN
1.0000 mg | INTRAMUSCULAR | Status: DC | PRN
  Administered 2014-01-14: 1 mg via INTRAVENOUS
  Administered 2014-01-15 (×2): 2 mg via INTRAVENOUS
  Filled 2014-01-14 (×3): qty 1

## 2014-01-14 MED ORDER — ROCURONIUM BROMIDE 100 MG/10ML IV SOLN
INTRAVENOUS | Status: AC
  Filled 2014-01-14: qty 1

## 2014-01-14 MED ORDER — LIDOCAINE HCL (CARDIAC) 20 MG/ML IV SOLN
INTRAVENOUS | Status: AC
  Filled 2014-01-14: qty 5

## 2014-01-14 MED ORDER — ROCURONIUM BROMIDE 100 MG/10ML IV SOLN
INTRAVENOUS | Status: DC | PRN
  Administered 2014-01-14: 10 mg via INTRAVENOUS
  Administered 2014-01-14: 30 mg via INTRAVENOUS
  Administered 2014-01-14: 10 mg via INTRAVENOUS

## 2014-01-14 MED ORDER — MIDAZOLAM HCL 2 MG/2ML IJ SOLN
INTRAMUSCULAR | Status: AC
Start: 2014-01-14 — End: 2014-01-14
  Filled 2014-01-14: qty 2

## 2014-01-14 MED ORDER — NEOSTIGMINE METHYLSULFATE 10 MG/10ML IV SOLN
INTRAVENOUS | Status: DC | PRN
  Administered 2014-01-14: 5 mg via INTRAVENOUS

## 2014-01-14 MED ORDER — FENTANYL CITRATE 0.05 MG/ML IJ SOLN
INTRAMUSCULAR | Status: AC
  Filled 2014-01-14: qty 2

## 2014-01-14 MED ORDER — MUPIROCIN 2 % EX OINT
1.0000 "application " | TOPICAL_OINTMENT | Freq: Two times a day (BID) | CUTANEOUS | Status: DC
  Administered 2014-01-14 – 2014-01-17 (×7): 1 via NASAL
  Filled 2014-01-14 (×2): qty 22

## 2014-01-14 MED ORDER — HYDROCODONE-ACETAMINOPHEN 5-325 MG PO TABS
1.0000 | ORAL_TABLET | ORAL | Status: DC | PRN
Start: 2014-01-14 — End: 2014-01-16
  Administered 2014-01-15 – 2014-01-16 (×5): 2 via ORAL
  Filled 2014-01-14 (×5): qty 2

## 2014-01-14 MED ORDER — BUPIVACAINE HCL (PF) 0.25 % IJ SOLN
INTRAMUSCULAR | Status: DC | PRN
  Administered 2014-01-14: 30 mL

## 2014-01-14 MED ORDER — GLYCOPYRROLATE 0.2 MG/ML IJ SOLN
INTRAMUSCULAR | Status: DC | PRN
  Administered 2014-01-14: 0.4 mg via INTRAVENOUS
  Administered 2014-01-14: 0.6 mg via INTRAVENOUS

## 2014-01-14 SURGICAL SUPPLY — 38 items
APPLIER CLIP ROT 10 11.4 M/L (STAPLE) ×2
BENZOIN TINCTURE PRP APPL 2/3 (GAUZE/BANDAGES/DRESSINGS) ×2 IMPLANT
CANISTER SUCTION 2500CC (MISCELLANEOUS) ×2 IMPLANT
CHLORAPREP W/TINT 26ML (MISCELLANEOUS) ×2 IMPLANT
CHOLANGIOGRAM CATH TAUT (CATHETERS) ×2 IMPLANT
CLIP APPLIE ROT 10 11.4 M/L (STAPLE) ×1 IMPLANT
COVER MAYO STAND STRL (DRAPES) IMPLANT
DECANTER SPIKE VIAL GLASS SM (MISCELLANEOUS) ×2 IMPLANT
DERMABOND ADVANCED (GAUZE/BANDAGES/DRESSINGS) ×1
DERMABOND ADVANCED .7 DNX12 (GAUZE/BANDAGES/DRESSINGS) ×1 IMPLANT
DRAPE C-ARM 42X120 X-RAY (DRAPES) IMPLANT
DRAPE LAPAROSCOPIC ABDOMINAL (DRAPES) ×2 IMPLANT
DRAPE UTILITY XL STRL (DRAPES) ×2 IMPLANT
ELECT REM PT RETURN 9FT ADLT (ELECTROSURGICAL) ×2
ELECTRODE REM PT RTRN 9FT ADLT (ELECTROSURGICAL) ×1 IMPLANT
GLOVE SURG SIGNA 7.5 PF LTX (GLOVE) ×2 IMPLANT
GOWN SPEC L4 XLG W/TWL (GOWN DISPOSABLE) ×2 IMPLANT
GOWN STRL REUS W/ TWL XL LVL3 (GOWN DISPOSABLE) ×3 IMPLANT
GOWN STRL REUS W/TWL XL LVL3 (GOWN DISPOSABLE) ×3
HEMOSTAT SURGICEL 4X8 (HEMOSTASIS) IMPLANT
IV CATH 14GX2 1/4 (CATHETERS) ×2 IMPLANT
IV SET MACRO CATH EXT 6 LUER (IV SETS) ×2 IMPLANT
KIT BASIN OR (CUSTOM PROCEDURE TRAY) ×2 IMPLANT
NS IRRIG 1000ML POUR BTL (IV SOLUTION) IMPLANT
POUCH SPECIMEN RETRIEVAL 10MM (ENDOMECHANICALS) IMPLANT
SET IRRIG TUBING LAPAROSCOPIC (IRRIGATION / IRRIGATOR) ×2 IMPLANT
SLEEVE XCEL OPT CAN 5 100 (ENDOMECHANICALS) ×2 IMPLANT
SOLUTION ANTI FOG 6CC (MISCELLANEOUS) ×2 IMPLANT
STOPCOCK 4 WAY LG BORE MALE ST (IV SETS) ×2 IMPLANT
STRIP CLOSURE SKIN 1/4X4 (GAUZE/BANDAGES/DRESSINGS) ×2 IMPLANT
SUT VIC AB 5-0 PS2 18 (SUTURE) ×4 IMPLANT
TOWEL OR 17X26 10 PK STRL BLUE (TOWEL DISPOSABLE) ×2 IMPLANT
TOWEL OR NON WOVEN STRL DISP B (DISPOSABLE) ×2 IMPLANT
TRAY LAP CHOLE (CUSTOM PROCEDURE TRAY) ×2 IMPLANT
TROCAR BLADELESS OPT 5 100 (ENDOMECHANICALS) ×2 IMPLANT
TROCAR XCEL BLUNT TIP 100MML (ENDOMECHANICALS) ×2 IMPLANT
TROCAR XCEL NON-BLD 11X100MML (ENDOMECHANICALS) ×2 IMPLANT
TUBING INSUFFLATION 10FT LAP (TUBING) ×2 IMPLANT

## 2014-01-14 NOTE — Progress Notes (Signed)
    Progress Note   Subjective  waiting on surgery today. Comfortable on pain meds, some nausea   Objective   Vital signs in last 24 hours: Temp:  [97.7 F (36.5 C)-98.9 F (37.2 C)] 97.7 F (36.5 C) (08/14 0604) Pulse Rate:  [48-52] 52 (08/14 0604) Resp:  [18] 18 (08/14 0604) BP: (136-152)/(65-81) 136/65 mmHg (08/14 0604) SpO2:  [96 %-99 %] 96 % (08/14 0604)   General:    Pleasant white male in NAD Heart:  Regular rate and rhythm Abdomen:  Soft, nontender, mild RUQ tenderness.  Extremities:  Without edema. Neurologic:  Alert and oriented,  grossly normal neurologically. Psych:  Cooperative. Normal mood and affect.    Assessment / Plan:   22. 63 year old male with acute abdominal pain, nausea, vomiting and ultrasound suggestive of biliary obstruction and acute cholecystitis. Transaminases elevated (100/88) but no evidence for cholestasis by labs. Getting Zosyn for possible cholecystitis. Plan is for cholecystectomy today.  Will sign off. Call for questions.  2. HCV, s/p treatment per patient.   3. Right renal mass, rule out RCC. For eventual MRI    LOS: 2 days   Tye Savoy  01/14/2014, 9:09 AM  Attending MD note:   I have taken a history, examined the patient, and reviewed the chart. I agree with the Advanced Practitioner's impression and recommendations. Please call if GI help needed.  [facility] Pager # [phone]

## 2014-01-14 NOTE — Progress Notes (Addendum)
Patient ID: Jonathan Wilson, male   DOB: [birth year], 63 y.o.   MRN: [id number]    Subjective: Pt ready for surgery.  Pain is well controlled  Objective: Vital signs in last 24 hours: Temp:  [97.7 F (36.5 C)-98.9 F (37.2 C)] 97.7 F (36.5 C) (08/14 0604) Pulse Rate:  [48-52] 52 (08/14 0604) Resp:  [18] 18 (08/14 0604) BP: (136-152)/(65-81) 136/65 mmHg (08/14 0604) SpO2:  [96 %-99 %] 96 % (08/14 0604)    Intake/Output from previous day: 08/13 0701 - 08/14 0700 In: 3490 [P.O.:1040; I.V.:2400; IV Piggyback:50] Out: 69 [Urine:4100] Intake/Output this shift:    PE: Abd: soft, ND, tender in RUQ  Lab Results:   Recent Labs  01/12/14 0928 01/13/14 0450  WBC 9.5 6.6  HGB 15.8 14.2  HCT 44.2 42.4  PLT 151 132*   BMET  Recent Labs  01/12/14 0928 01/13/14 0450  NA 140 140  K 3.7 4.8  CL 103 101  CO2 24 27  GLUCOSE 150* 90  BUN 13 10  CREATININE 0.79 0.85  CALCIUM 9.6 9.1   PT/INR No results found for this basename: LABPROT, INR,  in the last 72 hours CMP     Component Value Date/Time   NA 140 01/13/2014 0450   K 4.8 01/13/2014 0450   CL 101 01/13/2014 0450   CO2 27 01/13/2014 0450   GLUCOSE 90 01/13/2014 0450   BUN 10 01/13/2014 0450   CREATININE 0.85 01/13/2014 0450   CREATININE 0.98 07/16/2013 1038   CALCIUM 9.1 01/13/2014 0450   PROT 6.5 01/13/2014 0450   ALBUMIN 3.5 01/13/2014 0450   AST 100* 01/13/2014 0450   ALT 88* 01/13/2014 0450   ALKPHOS 103 01/13/2014 0450   BILITOT 0.6 01/13/2014 0450   GFRNONAA >90 01/13/2014 0450   GFRAA >90 01/13/2014 0450   Lipase     Component Value Date/Time   LIPASE 26 01/12/2014 0928       Studies/Results: US Abdomen Limited Ruq  01/12/2014   CLINICAL DATA:  Right upper quadrant pain  EXAM: US ABDOMEN LIMITED - RIGHT UPPER QUADRANT  COMPARISON:  CT abdomen 10/09/2009.  FINDINGS: Gallbladder:  The gallbladder is abnormal, containing sludge in showing wall thickening up to 5 mm in diameter. There is tenderness in the region  consistent with positive Murphy's sign. Findings suggest acute cholecystitis, despite the absence of visualization of a shadowing stone.  Common bile duct:  Diameter: Dilated at 7.5 mm. This raises the possibility of choledocholithiasis.  Liver:  Mild intrahepatic ductal dilatation. No focal lesion or generalized abnormality.  In the upper pole the right kidney, there is an echogenic mass measuring 2.6 cm in diameter. This was not visible in 2011 and therefore is worrisome for renal cell carcinoma.  IMPRESSION: Findings consistent with biliary obstruction and possible cholecystitis. There is mild intrahepatic ductal dilatation and moderate extra hepatic ductal dilatation. The gallbladder contains sludge and shows wall thickening with a positive sonographic Murphy's sign.  2.6 cm in diameter new solid lesion at the upper pole the right kidney worrisome for renal cell carcinoma. If the patient is a good candidate, MRI would be slightly preferable to CT for further evaluation.   Electronically Signed   By: Nelson Chimes M.D.   On: 01/12/2014 11:40    Anti-infectives: Anti-infectives   Start     Dose/Rate Route Frequency Ordered Stop   01/13/14 1400  piperacillin-tazobactam (ZOSYN) IVPB 3.375 g     3.375 g 12.5 mL/hr over 240 Minutes Intravenous 3  times per day 01/13/14 1032     01/13/14 1030  ciprofloxacin (CIPRO) IVPB 400 mg  Status:  Discontinued     400 mg 200 mL/hr over 60 Minutes Intravenous 2 times daily 01/13/14 0956 01/13/14 1032   01/13/14 0945  cefoTEtan (CEFOTAN) 2 g in dextrose 5 % 50 mL IVPB  Status:  Discontinued     2 g 100 mL/hr over 30 Minutes Intravenous On call to O.R. 01/13/14 0934 01/13/14 0956   01/12/14 1600  ciprofloxacin (CIPRO) IVPB 400 mg  Status:  Discontinued     400 mg 200 mL/hr over 60 Minutes Intravenous Every 12 hours 01/12/14 1517 01/13/14 0934     Assessment/Plan  1. Acute cholecystitis  Plan: 1. For OR today for lap chole   LOS: 2 days   OSBORNE,KELLY  E 01/14/2014, 11:09 AM Pager: [phone]  For surgery today.  I discussed with the patient the indications and risks of gall bladder surgery.  The primary risks of gall bladder surgery include, but are not limited to, bleeding, infection, common bile duct injury, and open surgery.  There is also the risk that the patient may have continued symptoms after surgery.  However, the likelihood of improvement in symptoms and return to the patient's normal status is good. We discussed the typical post-operative recovery course. I tried to answer the patient's questions. There is no one at the hospital with him for me to talk to post op.  Alphonsa Overall, MD, [facility] Surgery Pager: [phone] Office phone:  [phone]

## 2014-01-14 NOTE — Op Note (Addendum)
01/12/2014 - 01/14/2014  2:46 PM  PATIENT:  Jonathan Wilson, 63 y.o., male, MRN: [id number]  PREOP DIAGNOSIS:  cholelithiasis  POSTOP DIAGNOSIS:   Chronic cholecystitis, cholelithiasis  PROCEDURE:   Procedure(s): LAPAROSCOPIC CHOLECYSTECTOMY WITH INTRAOPERATIVE CHOLANGIOGRAM  SURGEON:   Alphonsa Overall, M.D.  ASSISTANT:   A. Marcello Moores, MD  ANESTHESIA:   general  Anesthesiologist: Peyton Najjar, MD CRNA: Kerrie Buffalo Flynn-Cook; Lind Covert, CRNA  General  ASA: @asa @  EBL:  minimal  ml  BLOOD ADMINISTERED: none  DRAINS: none   LOCAL MEDICATIONS USED:   30 cc 1/4% marcaine  SPECIMEN:   Gall bladder  COUNTS CORRECT:  YES  INDICATIONS FOR PROCEDURE:  Jonathan Wilson is a 63 y.o. (DOB: May 16, 1951) white  male whose primary care physician is [name], MD and comes for cholecystectomy.   The indications and risks of the gall bladder surgery were explained to the patient.  The risks include, but are not limited to, infection, bleeding, common bile duct injury and open surgery.  SURGERY:  The patient was taken to room #1 at [facility].  The abdomen was prepped with chloroprep.  The patient was on zosyn as an antibiotic    A time out was held and the surgical checklist run.   An infraumbilical incision was made into the abdominal cavity.  A 12 mm Hasson trocar was inserted into the abdominal cavity through the infraumbilical incision and secured with a 0 Vicryl suture.  Three additional trocars were inserted: a 10 mm trocar in the sub-xiphoid location, a 5 mm trocar in the right mid subcostal area, and a 5 mm trocar in the right lateral subcostal area.   The abdomen was explored and the liver, stomach, and bowel that could be seen were unremarkable.   The gall bladder was identified.  There were a lot of adhesions covering about 70% of the gall bladder.  The gall bladder was grasped, and rotated cephalad.  Disssection was carried down to the gall bladder/cystic duct  junction and the cystic duct isolated.  A clip was placed on the gall bladder side of the cystic duct.   An intra-operative cholangiogram was shot.   The intra-operative cholangiogram was shot using a cut off Taut catheter placed through a 14 gauge angiocath in the RUQ.  The Taut catheter was inserted in the cut cystic duct and secured with an endoclip.  A cholangiogram was shot with 12 cc of 1/2 strength Omnipaque.  Using fluoroscopy, the cholangiogram showed the flow of contrast into the common bile duct, up the hepatic radicals, and into the duodenum.  There was no mass or obstruction.  This was a normal intra-operative cholangiogram.   The Taut catheter was removed.  The cystic duct was tripley endoclipped and the cystic artery was identified and clipped.  The gall bladder was bluntly and sharpley dissected from the gall bladder bed.   After the gall bladder was removed from the liver, the gall bladder bed and [place] were inspected.  There was no bleeding or bile leak.  The gall bladder was placed in a endocatch bag and delivered through the umbilicus.  The abdomen was irrigated with 500 cc saline.   The trocars were then removed.  I infiltrated 30 cc of 1/4% Marcaine into the incisions.  The umbilical port closed with a 0 Vicryl suture and the skin closed with 5-0 vicryl.  The skin was painted with Dermabond.  The patient's sponge and needle count were correct.  The patient was transported to the RR in good condition.  I will continue antibiotics while in hospital, but he should not need these on discharge.  Alphonsa Overall, MD, [facility] Surgery Pager: [phone] Office phone:  [phone]

## 2014-01-14 NOTE — Progress Notes (Signed)
[facility] PROGRESS NOTE  Jonathan Wilson:[id number] DOB: 03/03/1951 DOA: 01/12/2014 PCP: [name], MD   Brief narrative  63 year old obese male with history of hep C, treated in the past, hypertension, gout, history of recurrent pneumonia was in to the ED with acute onset severe epigastric and right upper quadrant abdominal pain associated with dry heaving. Patient presented to the [facility] long ED where he had workup done with ultrasound of the abdomen which showed biliary obstruction and possible cholecystitis. Admitted to hospitalist service. GI and surgery consulted. Also an incidental 2.6 cm new solid lesion in the upper pole of right kidney was noted on ultrasound worrisome for renal cell carcinoma.  Assessment/Plan: Acute cholecystitis Seen by GI and surgery.  laparoscopic cholecystectomy today - IV fluids and pain control with when necessary Dilaudid. Continue when necessary Zosyn for now  Uncontrolled hypertension Continue home blood pressure medications. Added  when necessary hydralazine. Stable today.  Right kidney mass Incidental finding on ultrasound of the abdomen. Will order MRI of the abdomen for tomorrow   Tobacco abuse Counseled on cessation. ordered nicotine patch.  History of gout Continue allopurinol.  EVT prophylaxis: Subcutaneous Lovenox  Code Status: Full code Family Communication: None at bedside Disposition Plan: Home once stable   Consultants:  GI  Surgery  Procedures:  None  Antibiotics:  IV Zosyn  HPI/Subjective: abd pain better with dilaudid. No overnight issues   Objective: Filed Vitals:   01/14/14 0604  BP: 136/65  Pulse: 52  Temp: 97.7 F (36.5 C)  Resp: 18    Intake/Output Summary (Last 24 hours) at 01/14/14 1311 Last data filed at 01/14/14 1000  Gross per 24 hour  Intake   1890 ml  Output   4100 ml  Net  -2210 ml   Filed Weights   01/12/14 1808  Weight: 110.8 kg (244 lb 4.3 oz)     Exam:   General:  No acute distress  HEENT: No pallor, no icterus, moist oral mucosa  Cardiovascular: Normal S1 and S2, no murmurs  Respiratory: clear bilaterally  Abdomen: Soft, nondistended, right upper quadrant tenderness, bowel sounds present  Musculoskeletal: Warm, no edema  CNS: Alert and oriented  Data Reviewed: Basic Metabolic Panel:  Recent Labs Lab 01/12/14 0928 01/13/14 0450  NA 140 140  K 3.7 4.8  CL 103 101  CO2 24 27  GLUCOSE 150* 90  BUN 13 10  CREATININE 0.79 0.85  CALCIUM 9.6 9.1   Liver Function Tests:  Recent Labs Lab 01/12/14 0928 01/13/14 0450  AST 17  20 100*  ALT 14  16 88*  ALKPHOS 73  74 103  BILITOT 0.5  0.5 0.6  PROT 6.9  7.1 6.5  ALBUMIN 4.0  4.1 3.5    Recent Labs Lab 01/12/14 0928  LIPASE 26   No results found for this basename: AMMONIA,  in the last 168 hours CBC:  Recent Labs Lab 01/12/14 0928 01/13/14 0450  WBC 9.5 6.6  NEUTROABS 8.3*  --   HGB 15.8 14.2  HCT 44.2 42.4  MCV 92.9 97.0  PLT 151 132*   Cardiac Enzymes: No results found for this basename: CKTOTAL, CKMB, CKMBINDEX, TROPONINI,  in the last 168 hours BNP (last 3 results)  Recent Labs  06/21/13 0908  PROBNP 2813.0*   CBG:  Recent Labs Lab 01/13/14 0733 01/14/14 0728  GLUCAP 93 99    Recent Results (from the past 240 hour(s))  SURGICAL PCR SCREEN     Status: Abnormal  Collection Time    01/13/14 11:51 AM      Result Value Ref Range Status   MRSA, PCR NEGATIVE  NEGATIVE Final   Staphylococcus aureus POSITIVE (*) NEGATIVE Final   Comment:            The Xpert SA Assay (FDA     approved for NASAL specimens     in patients over 26 years of age),     is one component of     a comprehensive surveillance     program.  Test performance has     been validated by [employer] for patients greater     than or equal to 30 year old.     It is not intended     to diagnose infection nor to     guide or monitor treatment.      Studies: No results found.  Scheduled Meds: . [facility] HOLD] allopurinol  300 mg Oral Daily  . [MAR HOLD] atenolol  100 mg Oral QHS  . [MAR HOLD] Chlorhexidine Gluconate Cloth  6 each Topical Daily  . [facility] HOLD] cholecalciferol  5,000 Units Oral Daily  . [facility] HOLD] citalopram  40 mg Oral Daily  . [MAR HOLD] lisinopril  40 mg Oral q morning - 10a  . [MAR HOLD] multivitamin with minerals  1 tablet Oral q morning - 10a  . [facility] HOLD] mupirocin ointment  1 application Nasal BID  . [facility] HOLD] nicotine  7 mg Transdermal Daily  . [MAR HOLD] piperacillin-tazobactam (ZOSYN)  IV  3.375 g Intravenous 3 times per day   Continuous Infusions: . lactated ringers 1,000 mL (01/14/14 1218)      Time spent: 25 minutes    Sheneka Schrom, [place] [facility] Pager [phone] If 7PM-7AM, please contact night-coverage at [email or web address], password [facility] 01/14/2014, 1:11 PM  LOS: 2 days

## 2014-01-14 NOTE — Transfer of Care (Signed)
Immediate Anesthesia Transfer of Care Note  Patient: Jonathan Wilson  Procedure(s) Performed: Procedure(s): LAPAROSCOPIC CHOLECYSTECTOMY WITH INTRAOPERATIVE CHOLANGIOGRAM (N/A)  Patient Location: PACU  Anesthesia Type:General  Level of Consciousness: awake, alert , oriented and patient cooperative  Airway & Oxygen Therapy: Patient Spontanous Breathing and Patient connected to face mask oxygen  Post-op Assessment: Report given to PACU RN, Post -op Vital signs reviewed and stable and Patient moving all extremities  Post vital signs: Reviewed and stable  Complications: No apparent anesthesia complications

## 2014-01-14 NOTE — Anesthesia Postprocedure Evaluation (Signed)
  Anesthesia Post-op Note  Patient: Jonathan Wilson  Procedure(s) Performed: Procedure(s) (LRB): LAPAROSCOPIC CHOLECYSTECTOMY WITH INTRAOPERATIVE CHOLANGIOGRAM (N/A)  Patient Location: PACU  Anesthesia Type: General  Level of Consciousness: awake and alert   Airway and Oxygen Therapy: Patient Spontanous Breathing  Post-op Pain: mild  Post-op Assessment: Post-op Vital signs reviewed, Patient's Cardiovascular Status Stable, Respiratory Function Stable, Patent Airway and No signs of Nausea or vomiting  Last Vitals:  Filed Vitals:   01/14/14 1500  BP: 170/124  Pulse: 56  Temp: 36.8 C  Resp: 14    Post-op Vital Signs: stable   Complications: No apparent anesthesia complications

## 2014-01-14 NOTE — Anesthesia Preprocedure Evaluation (Signed)
Anesthesia Evaluation  Patient identified by MRN, date of birth, ID band Patient awake    Reviewed: Allergy & Precautions, H&P , NPO status , Patient's Chart, lab work & pertinent test results, reviewed documented beta blocker date and time   Airway Mallampati: II TM Distance: >3 FB Neck ROM: full    Dental no notable dental hx. (+) Teeth Intact, Dental Advisory Given   Pulmonary neg pulmonary ROS, COPDCurrent Smoker,  Mild COPD breath sounds clear to auscultation  Pulmonary exam normal       Cardiovascular hypertension, Pt. on medications and Pt. on home beta blockers Rhythm:regular Rate:Normal  IRBBB and LAFB   Neuro/Psych Anxiety Schizophrenia negative neurological ROS  negative psych ROS   GI/Hepatic negative GI ROS, Neg liver ROS, (+) Hepatitis -, C  Endo/Other  negative endocrine ROS  Renal/GU negative Renal ROS  negative genitourinary   Musculoskeletal   Abdominal   Peds  Hematology negative hematology ROS (+)   Anesthesia Other Findings   Reproductive/Obstetrics negative OB ROS                           Anesthesia Physical Anesthesia Plan  ASA: III  Anesthesia Plan: General   Post-op Pain Management:    Induction: Intravenous  Airway Management Planned: Oral ETT  Additional Equipment:   Intra-op Plan:   Post-operative Plan: Extubation in OR  Informed Consent: I have reviewed the patients History and Physical, chart, labs and discussed the procedure including the risks, benefits and alternatives for the proposed anesthesia with the patient or authorized representative who has indicated his/her understanding and acceptance.   Dental Advisory Given  Plan Discussed with: CRNA and Surgeon  Anesthesia Plan Comments:         Anesthesia Quick Evaluation

## 2014-01-15 LAB — GLUCOSE, CAPILLARY: Glucose-Capillary: 172 mg/dL — ABNORMAL HIGH (ref 70–99)

## 2014-01-15 NOTE — Progress Notes (Signed)
[facility] PROGRESS NOTE  ABRON NEDDO NGE:[id number] DOB: 1950/07/19 DOA: 01/12/2014 PCP: [name], MD   Brief narrative  63 year old obese male with history of hep C, treated in the past, hypertension, gout, history of recurrent pneumonia was in to the ED with acute onset severe epigastric and right upper quadrant abdominal pain associated with dry heaving. Patient presented to the [facility] long ED where he had workup done with ultrasound of the abdomen which showed biliary obstruction and possible cholecystitis. Admitted to hospitalist service. GI and surgery consulted. Also an incidental 2.6 cm new solid lesion in the upper pole of right kidney was noted on ultrasound worrisome for renal cell carcinoma.  Assessment/Plan: Acute cholecystitis Nephroscopy cholecystectomy with IOC done. - IV fluids and pain control with when necessary Dilaudid. DC Zosyn. Tolerating regular diet  Uncontrolled hypertension Continue home blood pressure medications. Added  when necessary hydralazine and stable  Right kidney mass Incidental finding on ultrasound of the abdomen. Ordered MRI of the abdomen  Tobacco abuse Counseled on cessation. ordered nicotine patch.  History of gout Continue allopurinol.  EVT prophylaxis: Subcutaneous Lovenox  Code Status: Full code Family Communication: None at bedside Disposition Plan: Home possibly in a.m.  Consultants:  GI  Surgery  Procedures:  None  Antibiotics:  IV Zosyn  HPI/Subjective: Patient seen and examined this morning. Reports some soreness over surgical site. Abdominal pain much improved   Objective: Filed Vitals:   01/15/14 1424  BP: 137/74  Pulse: 48  Temp: 98.3 F (36.8 C)  Resp: 18    Intake/Output Summary (Last 24 hours) at 01/15/14 1514 Last data filed at 01/15/14 1426  Gross per 24 hour  Intake 2193.75 ml  Output   1106 ml  Net 1087.75 ml   Filed Weights   01/12/14 1808  Weight: 110.8 kg (244 lb  4.3 oz)    Exam:   General:  No acute distress  HEENT:  moist oral mucosa  Cardiovascular: Normal S1 and S2,   Respiratory: clear bilaterally  Abdomen: Soft, nondistended, minimal tenderness over the laparoscopy site bowel sounds present  Musculoskeletal: Warm, no edema  CNS: Alert and oriented  Data Reviewed: Basic Metabolic Panel:  Recent Labs Lab 01/12/14 0928 01/13/14 0450  NA 140 140  K 3.7 4.8  CL 103 101  CO2 24 27  GLUCOSE 150* 90  BUN 13 10  CREATININE 0.79 0.85  CALCIUM 9.6 9.1   Liver Function Tests:  Recent Labs Lab 01/12/14 0928 01/13/14 0450  AST 17  20 100*  ALT 14  16 88*  ALKPHOS 73  74 103  BILITOT 0.5  0.5 0.6  PROT 6.9  7.1 6.5  ALBUMIN 4.0  4.1 3.5    Recent Labs Lab 01/12/14 0928  LIPASE 26   No results found for this basename: AMMONIA,  in the last 168 hours CBC:  Recent Labs Lab 01/12/14 0928 01/13/14 0450  WBC 9.5 6.6  NEUTROABS 8.3*  --   HGB 15.8 14.2  HCT 44.2 42.4  MCV 92.9 97.0  PLT 151 132*   Cardiac Enzymes: No results found for this basename: CKTOTAL, CKMB, CKMBINDEX, TROPONINI,  in the last 168 hours BNP (last 3 results)  Recent Labs  06/21/13 0908  PROBNP 2813.0*   CBG:  Recent Labs Lab 01/13/14 0733 01/14/14 0728  GLUCAP 93 99    Recent Results (from the past 240 hour(s))  SURGICAL PCR SCREEN     Status: Abnormal   Collection Time    01/13/14  11:51 AM      Result Value Ref Range Status   MRSA, PCR NEGATIVE  NEGATIVE Final   Staphylococcus aureus POSITIVE (*) NEGATIVE Final   Comment:            The Xpert SA Assay (FDA     approved for NASAL specimens     in patients over 79 years of age),     is one component of     a comprehensive surveillance     program.  Test performance has     been validated by [employer] for patients greater     than or equal to 45 year old.     It is not intended     to diagnose infection nor to     guide or monitor treatment.      Studies: Dg Cholangiogram Operative  01/14/2014   CLINICAL DATA:  Cholecystitis  EXAM: INTRAOPERATIVE CHOLANGIOGRAM  TECHNIQUE: Cholangiographic images from the C-arm fluoroscopic device were submitted for interpretation post-operatively. Please see the procedural report for the amount of contrast and the fluoroscopy time utilized.  COMPARISON:  01/12/2014  FINDINGS: Intraoperative cholangiogram performed during laparoscopic cholecystectomy. Biliary confluence, common hepatic duct, cystic duct and common bile duct are patent. Contrast opacifies the duodenum. Negative for obstruction or dilatation. No filling defect.  IMPRESSION: Patent biliary system.   Electronically Signed   By: Daryll Brod M.D.   On: 01/14/2014 15:00    Scheduled Meds: . allopurinol  300 mg Oral Daily  . atenolol  100 mg Oral QHS  . cholecalciferol  5,000 Units Oral Daily  . citalopram  40 mg Oral Daily  . lisinopril  40 mg Oral q morning - 10a  . multivitamin with minerals  1 tablet Oral q morning - 10a  . mupirocin ointment  1 application Nasal BID  . nicotine  7 mg Transdermal Daily  . piperacillin-tazobactam (ZOSYN)  IV  3.375 g Intravenous 3 times per day   Continuous Infusions: . lactated ringers 75 mL/hr (01/14/14 1841)      Time spent: 25 minutes    Marquesa Rath, [place] [facility] Pager [phone] If 7PM-7AM, please contact night-coverage at [email or web address], password [facility] 01/15/2014, 3:14 PM  LOS: 3 days

## 2014-01-15 NOTE — Progress Notes (Signed)
Patient ID: Jonathan Wilson, male   DOB: 10/04/1950, 63 y.o.   MRN: [id number] 1 Day Post-Op  Subjective: Patient feels much better today. Just sore. Tolerating regular food.  Objective: Vital signs in last 24 hours: Temp:  [97.5 F (36.4 C)-98.7 F (37.1 C)] 98.4 F (36.9 C) (08/15 0601) Pulse Rate:  [46-64] 48 (08/15 0601) Resp:  [12-18] 17 (08/15 0601) BP: (107-170)/(55-124) 137/72 mmHg (08/15 0601) SpO2:  [95 %-100 %] 100 % (08/15 0601)    Intake/Output from previous day: 08/14 0701 - 08/15 0700 In: 4153.8 [P.O.:480; I.V.:3673.8] Out: 1120 [Urine:1100; Blood:20] Intake/Output this shift:    General appearance: alert, cooperative and no distress GI: normal findings: soft, non-tender Incision/Wound: clean and dry without infection  Lab Results:   Recent Labs  01/13/14 0450  WBC 6.6  HGB 14.2  HCT 42.4  PLT 132*   BMET  Recent Labs  01/13/14 0450  NA 140  K 4.8  CL 101  CO2 27  GLUCOSE 90  BUN 10  CREATININE 0.85  CALCIUM 9.1     Studies/Results: Dg Cholangiogram Operative  01/14/2014   CLINICAL DATA:  Cholecystitis  EXAM: INTRAOPERATIVE CHOLANGIOGRAM  TECHNIQUE: Cholangiographic images from the C-arm fluoroscopic device were submitted for interpretation post-operatively. Please see the procedural report for the amount of contrast and the fluoroscopy time utilized.  COMPARISON:  01/12/2014  FINDINGS: Intraoperative cholangiogram performed during laparoscopic cholecystectomy. Biliary confluence, common hepatic duct, cystic duct and common bile duct are patent. Contrast opacifies the duodenum. Negative for obstruction or dilatation. No filling defect.  IMPRESSION: Patent biliary system.   Electronically Signed   By: Daryll Brod M.D.   On: 01/14/2014 15:00    Anti-infectives: Anti-infectives   Start     Dose/Rate Route Frequency Ordered Stop   01/13/14 1400  piperacillin-tazobactam (ZOSYN) IVPB 3.375 g     3.375 g 12.5 mL/hr over 240 Minutes Intravenous 3  times per day 01/13/14 1032     01/13/14 1030  ciprofloxacin (CIPRO) IVPB 400 mg  Status:  Discontinued     400 mg 200 mL/hr over 60 Minutes Intravenous 2 times daily 01/13/14 0956 01/13/14 1032   01/13/14 0945  cefoTEtan (CEFOTAN) 2 g in dextrose 5 % 50 mL IVPB  Status:  Discontinued     2 g 100 mL/hr over 30 Minutes Intravenous On call to O.R. 01/13/14 0934 01/13/14 0956   01/12/14 1600  ciprofloxacin (CIPRO) IVPB 400 mg  Status:  Discontinued     400 mg 200 mL/hr over 60 Minutes Intravenous Every 12 hours 01/12/14 1517 01/13/14 0934      Assessment/Plan: s/p Procedure(s): LAPAROSCOPIC CHOLECYSTECTOMY WITH INTRAOPERATIVE CHOLANGIOGRAM Doing well without complication. Okay for discharge. He will need outpatient MRI to evaluate right renal mass suspicious for renal cell carcinoma. I discussed this with the patient and he will contact his primary physician  to arrange MRI and urology followup.    LOS: 3 days    Naleigha Raimondi T 01/15/2014

## 2014-01-16 ENCOUNTER — Inpatient Hospital Stay (HOSPITAL_COMMUNITY): Payer: BC Managed Care – PPO

## 2014-01-16 LAB — GLUCOSE, CAPILLARY: GLUCOSE-CAPILLARY: 148 mg/dL — AB (ref 70–99)

## 2014-01-16 MED ORDER — HYDROCODONE-ACETAMINOPHEN 5-325 MG PO TABS
2.0000 | ORAL_TABLET | ORAL | Status: DC
  Administered 2014-01-16 – 2014-01-17 (×5): 2 via ORAL
  Filled 2014-01-16 (×5): qty 2

## 2014-01-16 MED ORDER — GADOBENATE DIMEGLUMINE 529 MG/ML IV SOLN
20.0000 mL | Freq: Once | INTRAVENOUS | Status: AC | PRN
  Administered 2014-01-16: 20 mL via INTRAVENOUS

## 2014-01-16 NOTE — Progress Notes (Signed)
[facility] PROGRESS NOTE  Jonathan Wilson VHQ:[id number] DOB: 08-21-1950 DOA: 01/12/2014 PCP: [name], MD   Brief narrative  63 year old obese male with history of hep C, treated in the past, hypertension, gout, history of recurrent pneumonia was in to the ED with acute onset severe epigastric and right upper quadrant abdominal pain associated with dry heaving. Patient presented to the [facility] long ED where he had workup done with ultrasound of the abdomen which showed biliary obstruction and possible cholecystitis. Admitted to hospitalist service. GI and surgery consulted. Also an incidental 2.6 cm new solid lesion in the upper pole of right kidney was noted on ultrasound worrisome for renal cell carcinoma.  Assessment/Plan: Acute cholecystitis laproscopic cholecystectomy with IOC done on 8/14. Stable post op. [place] for d/c per sx. Pain cotrol with Tolerating regular diet  Uncontrolled hypertension Continue home blood pressure medications. Stable after adding  when necessary hydralazine   Right kidney mass Incidental finding on ultrasound of the abdomen. Ordered MRI of the abdomen unable to differential RCC vs angiolipoma. apparently was seen on renal US 8 months back. Spoke with radiology and recommend non contrast CT abd to r/o angiomyolipoma which is benign .  Tobacco abuse Counseled on cessation. ordered nicotine patch.  History of gout Continue allopurinol.  DVT prophylaxis: Subcutaneous Lovenox  Code Status: Full code Family Communication: None at bedside Disposition Plan: pending CT abd.  Consultants:  GI  Surgery  Procedures:  None  Antibiotics:  IV Zosyn  HPI/Subjective: Patient seen and examined after MRI abdomen. Reports some abdominal soreness   Objective: Filed Vitals:   01/16/14 1405  BP: 125/76  Pulse: 49  Temp: 98.2 F (36.8 C)  Resp: 18    Intake/Output Summary (Last 24 hours) at 01/16/14 1453 Last data filed at 01/16/14  1404  Gross per 24 hour  Intake   1230 ml  Output      0 ml  Net   1230 ml   Filed Weights   01/12/14 1808  Weight: 110.8 kg (244 lb 4.3 oz)    Exam:   General:  No acute distress  HEENT:  moist oral mucosa  Cardiovascular: Normal S1 and S2,   Respiratory: clear bilaterally  Abdomen: Soft, nondistended, minimal tenderness over the laparoscopy site,  bowel sounds present    Data Reviewed: Basic Metabolic Panel:  Recent Labs Lab 01/12/14 0928 01/13/14 0450  NA 140 140  K 3.7 4.8  CL 103 101  CO2 24 27  GLUCOSE 150* 90  BUN 13 10  CREATININE 0.79 0.85  CALCIUM 9.6 9.1   Liver Function Tests:  Recent Labs Lab 01/12/14 0928 01/13/14 0450  AST 17  20 100*  ALT 14  16 88*  ALKPHOS 73  74 103  BILITOT 0.5  0.5 0.6  PROT 6.9  7.1 6.5  ALBUMIN 4.0  4.1 3.5    Recent Labs Lab 01/12/14 0928  LIPASE 26   No results found for this basename: AMMONIA,  in the last 168 hours CBC:  Recent Labs Lab 01/12/14 0928 01/13/14 0450  WBC 9.5 6.6  NEUTROABS 8.3*  --   HGB 15.8 14.2  HCT 44.2 42.4  MCV 92.9 97.0  PLT 151 132*   Cardiac Enzymes: No results found for this basename: CKTOTAL, CKMB, CKMBINDEX, TROPONINI,  in the last 168 hours BNP (last 3 results)  Recent Labs  06/21/13 0908  PROBNP 2813.0*   CBG:  Recent Labs Lab 01/13/14 0733 01/14/14 0728 01/15/14 0748 01/16/14 0932  GLUCAP 93 99 172* 148*    Recent Results (from the past 240 hour(s))  SURGICAL PCR SCREEN     Status: Abnormal   Collection Time    01/13/14 11:51 AM      Result Value Ref Range Status   MRSA, PCR NEGATIVE  NEGATIVE Final   Staphylococcus aureus POSITIVE (*) NEGATIVE Final   Comment:            The Xpert SA Assay (FDA     approved for NASAL specimens     in patients over 24 years of age),     is one component of     a comprehensive surveillance     program.  Test performance has     been validated by [employer] for patients greater     than or  equal to 49 year old.     It is not intended     to diagnose infection nor to     guide or monitor treatment.     Studies: Mr Abdomen W Wo Contrast  01/16/2014   CLINICAL DATA:  Right renal lesion on ultrasound. Cholecystectomy 2 days ago.  EXAM: MRI ABDOMEN WITHOUT AND WITH CONTRAST  TECHNIQUE: Multiplanar multisequence MR imaging of the abdomen was performed both before and after the administration of intravenous contrast.  CONTRAST:  64mL MULTIHANCE GADOBENATE DIMEGLUMINE 529 MG/ML IV SOLN  COMPARISON:  Renal ultrasound of 06/22/2013.  CT of 10/09/2009.  FINDINGS: Portions of exama mildly motion degraded. Normal heart size. Trace right pleural fluid. Patchy right base dependent atelectasis. Upper normal sized retrocrural nodes are favored to be reactive and are grossly similar back in 2011.  Normal liver, spleen, stomach.  Descending duodenal diverticulum.  Normal pancreas.  Cholecystectomy. Ill-defined edema in the operative bed, including on image 23/series 4. No well-defined fluid collection. No biliary ductal dilatation.  Normal adrenal glands. Moderate aortic atherosclerosis. No abdominal adenopathy or ascites.  Upper pole right renal lesion measures 2.3 cm on image 22/series 4. Demonstrates T2 hypointensity and relative T1 isointensity prior to contrast. Demonstrates mild post-contrast enhancement, including on image 36/ series 100. Right renal vein patent. No acute osseous abnormality.  IMPRESSION: 1. Upper pole right renal lesion which demonstrates T2 hypointensity and mild post-contrast enhancement. Differential considerations include papillary type renal cell carcinoma or angiomyolipoma. Thin section precontrast abdominal CT suggested to exclude intralesional fat (indicative of angiomyolipoma). 2. No evidence of metastatic disease or acute process in the abdomen. 3. Presumed postoperative edema in the cholecystectomy bed. No well-defined fluid collection. 4. Trace right pleural fluid with  adjacent right base atelectasis.   Electronically Signed   By: Abigail Miyamoto M.D.   On: 01/16/2014 14:28    Scheduled Meds: . allopurinol  300 mg Oral Daily  . atenolol  100 mg Oral QHS  . cholecalciferol  5,000 Units Oral Daily  . citalopram  40 mg Oral Daily  . lisinopril  40 mg Oral q morning - 10a  . multivitamin with minerals  1 tablet Oral q morning - 10a  . mupirocin ointment  1 application Nasal BID  . nicotine  7 mg Transdermal Daily   Continuous Infusions:      Time spent: 25 minutes    Tanaiya Kolarik  [facility] Pager [phone] If 7PM-7AM, please contact night-coverage at [email or web address], password [facility] 01/16/2014, 2:53 PM  LOS: 4 days

## 2014-01-17 ENCOUNTER — Encounter (HOSPITAL_COMMUNITY): Payer: Self-pay | Admitting: Surgery

## 2014-01-17 DIAGNOSIS — F17200 Nicotine dependence, unspecified, uncomplicated: Secondary | ICD-10-CM

## 2014-01-17 LAB — GLUCOSE, CAPILLARY: GLUCOSE-CAPILLARY: 106 mg/dL — AB (ref 70–99)

## 2014-01-17 MED ORDER — NICOTINE 7 MG/24HR TD PT24
7.0000 mg | MEDICATED_PATCH | Freq: Every day | TRANSDERMAL | Status: DC
Start: 2014-01-17 — End: 2014-01-20

## 2014-01-17 MED ORDER — HYDROCODONE-ACETAMINOPHEN 5-325 MG PO TABS: 2.0000 | ORAL_TABLET | Freq: Four times a day (QID) | ORAL | Status: DC | PRN

## 2014-01-17 NOTE — Progress Notes (Signed)
[facility] Progress Note  3 Days Post-Op  Subjective: Pt doing well, pain well controlled in abdomen.  No N/V, tolerating diet.  Ambulating well.  Urinating well and having BM's/flatus.    Objective: Vital signs in last 24 hours: Temp:  [98 F (36.7 C)-98.3 F (36.8 C)] 98 F (36.7 C) (08/17 0609) Pulse Rate:  [48-59] 59 (08/17 0609) Resp:  [18] 18 (08/17 0609) BP: (125-156)/(74-81) 156/81 mmHg (08/17 0609) SpO2:  [94 %-99 %] 94 % (08/17 0609) Weight:  [250 lb 4.8 oz (113.535 kg)] 250 lb 4.8 oz (113.535 kg) (08/17 0634) Last BM Date: 01/15/14  Intake/Output from previous day: 08/16 0701 - 08/17 0700 In: 720 [P.O.:720] Out: -  Intake/Output this shift:    PE: Gen:  Alert, NAD, pleasant Abd: Soft, mild tenderness, ND, +BS, no HSM, incisions C/D/I   Lab Results:  No results found for this basename: WBC, HGB, HCT, PLT,  in the last 72 hours BMET No results found for this basename: NA, K, CL, CO2, GLUCOSE, BUN, CREATININE, CALCIUM,  in the last 72 hours PT/INR No results found for this basename: LABPROT, INR,  in the last 72 hours CMP     Component Value Date/Time   NA 140 01/13/2014 0450   K 4.8 01/13/2014 0450   CL 101 01/13/2014 0450   CO2 27 01/13/2014 0450   GLUCOSE 90 01/13/2014 0450   BUN 10 01/13/2014 0450   CREATININE 0.85 01/13/2014 0450   CREATININE 0.98 07/16/2013 1038   CALCIUM 9.1 01/13/2014 0450   PROT 6.5 01/13/2014 0450   ALBUMIN 3.5 01/13/2014 0450   AST 100* 01/13/2014 0450   ALT 88* 01/13/2014 0450   ALKPHOS 103 01/13/2014 0450   BILITOT 0.6 01/13/2014 0450   GFRNONAA >90 01/13/2014 0450   GFRAA >90 01/13/2014 0450   Lipase     Component Value Date/Time   LIPASE 26 01/12/2014 0928       Studies/Results: Ct Abdomen Wo Contrast  01/16/2014   CLINICAL DATA:  Indeterminate right adrenal lesion.  EXAM: CT ABDOMEN WITHOUT CONTRAST  TECHNIQUE: Multidetector CT imaging of the abdomen was performed following the standard protocol without IV  contrast.  COMPARISON:  MRI 01/16/2014, CT 10/09/2009.  FINDINGS: Renal: The right upper pole lesion of concern in the right kidney measures 21 mm x 23 mm (image 18, series 5). There is no foci of low attenuation to suggest fat within this lesion. Lesion is r slightly hyper dense in comparison to the adjacent renal parenchyma. No additional renal lesions are identified on this noncontrast exam.  Lung bases are clear. No focal hepatic lesion. Postcholecystectomy. There is some inflammation in the gallbladder fossa. The pancreas is normal. Small duodenum diverticulum. Spleen and adrenal glands are normal.  The stomach and limited view of the small bowel and colon are unremarkable. No retroperitoneal periportal lymphadenopathy. No aggressive osseous lesion.  IMPRESSION: No intralesional fat with right upper pole renal lesion to suggest angiomyolipoma. Concern exists for enhancing renal lesion / papillary renal cell carcinoma. See MRI of the same date 01/16/2014.   Electronically Signed   By: Suzy Bouchard M.D.   On: 01/16/2014 15:56   Mr Abdomen W Wo Contrast  01/16/2014   CLINICAL DATA:  Right renal lesion on ultrasound. Cholecystectomy 2 days ago.  EXAM: MRI ABDOMEN WITHOUT AND WITH CONTRAST  TECHNIQUE: Multiplanar multisequence MR imaging of the abdomen was performed both before and after the administration of intravenous contrast.  CONTRAST:  69mL MULTIHANCE GADOBENATE DIMEGLUMINE 529  MG/ML IV SOLN  COMPARISON:  Renal ultrasound of 06/22/2013.  CT of 10/09/2009.  FINDINGS: Portions of exama mildly motion degraded. Normal heart size. Trace right pleural fluid. Patchy right base dependent atelectasis. Upper normal sized retrocrural nodes are favored to be reactive and are grossly similar back in 2011.  Normal liver, spleen, stomach.  Descending duodenal diverticulum.  Normal pancreas.  Cholecystectomy. Ill-defined edema in the operative bed, including on image 23/series 4. No well-defined fluid collection. No  biliary ductal dilatation.  Normal adrenal glands. Moderate aortic atherosclerosis. No abdominal adenopathy or ascites.  Upper pole right renal lesion measures 2.3 cm on image 22/series 4. Demonstrates T2 hypointensity and relative T1 isointensity prior to contrast. Demonstrates mild post-contrast enhancement, including on image 36/ series 100. Right renal vein patent. No acute osseous abnormality.  IMPRESSION: 1. Upper pole right renal lesion which demonstrates T2 hypointensity and mild post-contrast enhancement. Differential considerations include papillary type renal cell carcinoma or angiomyolipoma. Thin section precontrast abdominal CT suggested to exclude intralesional fat (indicative of angiomyolipoma). 2. No evidence of metastatic disease or acute process in the abdomen. 3. Presumed postoperative edema in the cholecystectomy bed. No well-defined fluid collection. 4. Trace right pleural fluid with adjacent right base atelectasis.   Electronically Signed   By: Abigail Miyamoto M.D.   On: 01/16/2014 14:28    Anti-infectives: Anti-infectives   Start     Dose/Rate Route Frequency Ordered Stop   01/13/14 1400  piperacillin-tazobactam (ZOSYN) IVPB 3.375 g  Status:  Discontinued     3.375 g 12.5 mL/hr over 240 Minutes Intravenous 3 times per day 01/13/14 1032 01/15/14 1515   01/13/14 1030  ciprofloxacin (CIPRO) IVPB 400 mg  Status:  Discontinued     400 mg 200 mL/hr over 60 Minutes Intravenous 2 times daily 01/13/14 0956 01/13/14 1032   01/13/14 0945  cefoTEtan (CEFOTAN) 2 g in dextrose 5 % 50 mL IVPB  Status:  Discontinued     2 g 100 mL/hr over 30 Minutes Intravenous On call to O.R. 01/13/14 0934 01/13/14 0956   01/12/14 1600  ciprofloxacin (CIPRO) IVPB 400 mg  Status:  Discontinued     400 mg 200 mL/hr over 60 Minutes Intravenous Every 12 hours 01/12/14 1517 01/13/14 0934       Assessment/Plan POD #3 s/p lap chole with negative IOC Right Kidney Lesion on MRI/CT concerning for RCC  Plan: 1.   D/c home per medicine service 2.  F/u with [place] on 02/08/14 at 2:45pm 3.  Encouraged ambulation, IS 4.  Discussed post-op instructions 5.  Will need OP f/u for renal lesion 6.  Will sign off    LOS: 5 days    DORT, Jinny Blossom 01/17/2014, 9:41 AM Pager: [phone]

## 2014-01-17 NOTE — Discharge Summary (Signed)
Physician Discharge Summary  Jonathan Wilson [id number] DOB: 08-01-1950 DOA: 01/12/2014  PCP: [name], MD  Admit date: 01/12/2014 Discharge date: 01/17/2014  Time spent: 35 minutes  Recommendations for Outpatient Follow-up:  Follow up with [facility] in 1 week ( [name])  patient will be called for urology appt with [name] in 1-2 weks  Discharge Diagnoses:  Principal Problem:   Cholecystitis, acute  Active Problems:   Renal mass, right   Uncontrolled hypertension   Biliary obstruction   Choledocholithiasis   Discharge Condition: fair  Diet recommendation: low sodium, low fat diet  Filed Weights   01/12/14 1808 01/17/14 0634  Weight: 110.8 kg (244 lb 4.3 oz) 113.535 kg (250 lb 4.8 oz)    History of present illness:  Please refer to admission H&P for details, but in brief,  63 year old obese male with history of hep C, treated in the past, hypertension, gout, history of recurrent pneumonia was in to the ED with acute onset severe epigastric and right upper quadrant abdominal pain associated with dry heaving. Patient presented to the [facility] long ED where he had workup done with ultrasound of the abdomen which showed biliary obstruction and possible cholecystitis. Admitted to hospitalist service. GI and surgery consulted.  Also an incidental 2.6 cm new solid lesion in the upper pole of right kidney was noted on ultrasound worrisome for renal cell carcinoma.  Hospital Course:  Acute cholecystitis  laproscopic cholecystectomy with IOC done on 8/14.  Stable post op. Tolerating diet. Will follow with surgery in 1 week. Pain meds prescribed on discharge  Uncontrolled hypertension  Continued home BP meds and added prn hydralazine. BP has been stable.   Right kidney mass  Incidental finding on ultrasound of the abdomen. Ordered MRI of the abdomen unable to differential RCC vs angiolipoma. apparently was seen on renal US 8 months back. Spoke with radiology and  recommend non contrast CT abd to r/o angiomyolipoma . CT wihtout contrast was done which was negative for angiomyolipoma and concerning for RCC. Spoke with urology resident on call Dr [name] who reviewed the images and recommended to have him follow up as outpt. He will arrange follow up visit with [name] in 1-2 weeks.   Tobacco abuse  Counseled on cessation. ordered nicotine patch.   History of gout  Continue allopurinol.   Patient stable for discharge home.   Code Status: Full code  Family Communication: None at bedside  Disposition Plan: home  Consultants:  GI  Surgery    Procedures:  Lap choly with IOC   Antibiotics:  IV Zosyn     Discharge Exam: Filed Vitals:   01/17/14 0609  BP: 156/81  Pulse: 59  Temp: 98 F (36.7 C)  Resp: 18   General: No acute distress  HEENT: moist oral mucosa  Cardiovascular: Normal S1 and S2,  Respiratory: clear bilaterally  Abdomen: Soft, nondistended, minimal tenderness over the laparoscopy site, bowel sounds present  EXT: warm, o edema CNS: alert and oreinted  Discharge Instructions You were cared for by a hospitalist during your hospital stay. If you have any questions about your discharge medications or the care you received while you were in the hospital after you are discharged, you can call the unit and asked to speak with the hospitalist on call if the hospitalist that took care of you is not available. Once you are discharged, your primary care physician will handle any further medical issues. Please note that NO REFILLS for any discharge medications will  be authorized once you are discharged, as it is imperative that you return to your primary care physician (or establish a relationship with a primary care physician if you do not have one) for your aftercare needs so that they can reassess your need for medications and monitor your lab values.     Medication List         allopurinol 300 MG tablet  Commonly known as:   ZYLOPRIM  Take 300 mg by mouth daily.     ALPRAZolam 1 MG tablet  Commonly known as:  XANAX  Take 1 mg by mouth 3 (three) times daily as needed for anxiety.     aspirin EC 81 MG tablet  Take 81 mg by mouth daily.     atenolol 100 MG tablet  Commonly known as:  TENORMIN  Take 100 mg by mouth at bedtime.     cholecalciferol 1000 UNITS tablet  Commonly known as:  VITAMIN D  Take 5,000-10,000 Units by mouth daily.     citalopram 40 MG tablet  Commonly known as:  CELEXA  Take 40 mg by mouth daily.     HYDROcodone-acetaminophen 5-325 MG per tablet  Commonly known as:  NORCO/VICODIN  Take 2 tablets by mouth every 6 (six) hours as needed for moderate pain.     lisinopril 20 MG tablet  Commonly known as:  PRINIVIL,ZESTRIL  Take 40 mg by mouth every morning.     multivitamin with minerals Tabs tablet  Take 1 tablet by mouth every morning.     nicotine 7 mg/24hr patch  Commonly known as:  NICODERM CQ - dosed in mg/24 hr  Place 1 patch (7 mg total) onto the skin daily.     VITAMIN B-12 CR PO  Take 1 tablet by mouth daily.       No Known Allergies     Follow-up Information   Follow up with NEWMAN,[name], MD. Schedule an appointment as soon as possible for a visit in 2 weeks.   Specialty:  General Surgery   Contact information:   [place] [address] [phone]       Follow up with [name], MD In 1 week.   Specialty:  Internal Medicine   Contact information:   [address] [place] [address] [phone]       Follow up with [name], MD In 2 weeks. (office will call for appointment)    Specialty:  Urology   Contact information:   [place]  [address] [phone]        The results of significant diagnostics from this hospitalization (including imaging, microbiology, ancillary and laboratory) are listed below for reference.    Significant Diagnostic Studies: Ct Abdomen  Wo Contrast  01/16/2014   CLINICAL DATA:  Indeterminate right adrenal lesion.  EXAM: CT ABDOMEN WITHOUT CONTRAST  TECHNIQUE: Multidetector CT imaging of the abdomen was performed following the standard protocol without IV contrast.  COMPARISON:  MRI 01/16/2014, CT 10/09/2009.  FINDINGS: Renal: The right upper pole lesion of concern in the right kidney measures 21 mm x 23 mm (image 18, series 5). There is no foci of low attenuation to suggest fat within this lesion. Lesion is r slightly hyper dense in comparison to the adjacent renal parenchyma. No additional renal lesions are identified on this noncontrast exam.  Lung bases are clear. No focal hepatic lesion. Postcholecystectomy. There is some inflammation in the gallbladder fossa. The pancreas is  normal. Small duodenum diverticulum. Spleen and adrenal glands are normal.  The stomach and limited view of the small bowel and colon are unremarkable. No retroperitoneal periportal lymphadenopathy. No aggressive osseous lesion.  IMPRESSION: No intralesional fat with right upper pole renal lesion to suggest angiomyolipoma. Concern exists for enhancing renal lesion / papillary renal cell carcinoma. See MRI of the same date 01/16/2014.   Electronically Signed   By: Suzy Bouchard M.D.   On: 01/16/2014 15:56   Dg Cholangiogram Operative  01/14/2014   CLINICAL DATA:  Cholecystitis  EXAM: INTRAOPERATIVE CHOLANGIOGRAM  TECHNIQUE: Cholangiographic images from the C-arm fluoroscopic device were submitted for interpretation post-operatively. Please see the procedural report for the amount of contrast and the fluoroscopy time utilized.  COMPARISON:  01/12/2014  FINDINGS: Intraoperative cholangiogram performed during laparoscopic cholecystectomy. Biliary confluence, common hepatic duct, cystic duct and common bile duct are patent. Contrast opacifies the duodenum. Negative for obstruction or dilatation. No filling defect.  IMPRESSION: Patent biliary system.   Electronically  Signed   By: Daryll Brod M.D.   On: 01/14/2014 15:00   Mr Abdomen W Wo Contrast  01/16/2014   CLINICAL DATA:  Right renal lesion on ultrasound. Cholecystectomy 2 days ago.  EXAM: MRI ABDOMEN WITHOUT AND WITH CONTRAST  TECHNIQUE: Multiplanar multisequence MR imaging of the abdomen was performed both before and after the administration of intravenous contrast.  CONTRAST:  38mL MULTIHANCE GADOBENATE DIMEGLUMINE 529 MG/ML IV SOLN  COMPARISON:  Renal ultrasound of 06/22/2013.  CT of 10/09/2009.  FINDINGS: Portions of exama mildly motion degraded. Normal heart size. Trace right pleural fluid. Patchy right base dependent atelectasis. Upper normal sized retrocrural nodes are favored to be reactive and are grossly similar back in 2011.  Normal liver, spleen, stomach.  Descending duodenal diverticulum.  Normal pancreas.  Cholecystectomy. Ill-defined edema in the operative bed, including on image 23/series 4. No well-defined fluid collection. No biliary ductal dilatation.  Normal adrenal glands. Moderate aortic atherosclerosis. No abdominal adenopathy or ascites.  Upper pole right renal lesion measures 2.3 cm on image 22/series 4. Demonstrates T2 hypointensity and relative T1 isointensity prior to contrast. Demonstrates mild post-contrast enhancement, including on image 36/ series 100. Right renal vein patent. No acute osseous abnormality.  IMPRESSION: 1. Upper pole right renal lesion which demonstrates T2 hypointensity and mild post-contrast enhancement. Differential considerations include papillary type renal cell carcinoma or angiomyolipoma. Thin section precontrast abdominal CT suggested to exclude intralesional fat (indicative of angiomyolipoma). 2. No evidence of metastatic disease or acute process in the abdomen. 3. Presumed postoperative edema in the cholecystectomy bed. No well-defined fluid collection. 4. Trace right pleural fluid with adjacent right base atelectasis.   Electronically Signed   By: Abigail Miyamoto  M.D.   On: 01/16/2014 14:28   US Abdomen Limited Ruq  01/12/2014   CLINICAL DATA:  Right upper quadrant pain  EXAM: US ABDOMEN LIMITED - RIGHT UPPER QUADRANT  COMPARISON:  CT abdomen 10/09/2009.  FINDINGS: Gallbladder:  The gallbladder is abnormal, containing sludge in showing wall thickening up to 5 mm in diameter. There is tenderness in the region consistent with positive Murphy's sign. Findings suggest acute cholecystitis, despite the absence of visualization of a shadowing stone.  Common bile duct:  Diameter: Dilated at 7.5 mm. This raises the possibility of choledocholithiasis.  Liver:  Mild intrahepatic ductal dilatation. No focal lesion or generalized abnormality.  In the upper pole the right kidney, there is an echogenic mass measuring 2.6 cm in diameter. This was not visible in 2011  and therefore is worrisome for renal cell carcinoma.  IMPRESSION: Findings consistent with biliary obstruction and possible cholecystitis. There is mild intrahepatic ductal dilatation and moderate extra hepatic ductal dilatation. The gallbladder contains sludge and shows wall thickening with a positive sonographic Murphy's sign.  2.6 cm in diameter new solid lesion at the upper pole the right kidney worrisome for renal cell carcinoma. If the patient is a good candidate, MRI would be slightly preferable to CT for further evaluation.   Electronically Signed   By: Nelson Chimes M.D.   On: 01/12/2014 11:40    Microbiology: Recent Results (from the past 240 hour(s))  SURGICAL PCR SCREEN     Status: Abnormal   Collection Time    01/13/14 11:51 AM      Result Value Ref Range Status   MRSA, PCR NEGATIVE  NEGATIVE Final   Staphylococcus aureus POSITIVE (*) NEGATIVE Final   Comment:            The Xpert SA Assay (FDA     approved for NASAL specimens     in patients over 61 years of age),     is one component of     a comprehensive surveillance     program.  Test performance has     been validated by [employer]  for patients greater     than or equal to 44 year old.     It is not intended     to diagnose infection nor to     guide or monitor treatment.     Labs: Basic Metabolic Panel:  Recent Labs Lab 01/12/14 0928 01/13/14 0450  NA 140 140  K 3.7 4.8  CL 103 101  CO2 24 27  GLUCOSE 150* 90  BUN 13 10  CREATININE 0.79 0.85  CALCIUM 9.6 9.1   Liver Function Tests:  Recent Labs Lab 01/12/14 0928 01/13/14 0450  AST 17  20 100*  ALT 14  16 88*  ALKPHOS 73  74 103  BILITOT 0.5  0.5 0.6  PROT 6.9  7.1 6.5  ALBUMIN 4.0  4.1 3.5    Recent Labs Lab 01/12/14 0928  LIPASE 26   No results found for this basename: AMMONIA,  in the last 168 hours CBC:  Recent Labs Lab 01/12/14 0928 01/13/14 0450  WBC 9.5 6.6  NEUTROABS 8.3*  --   HGB 15.8 14.2  HCT 44.2 42.4  MCV 92.9 97.0  PLT 151 132*   Cardiac Enzymes: No results found for this basename: CKTOTAL, CKMB, CKMBINDEX, TROPONINI,  in the last 168 hours BNP: BNP (last 3 results)  Recent Labs  06/21/13 0908  PROBNP 2813.0*   CBG:  Recent Labs Lab 01/13/14 0733 01/14/14 0728 01/15/14 0748 01/16/14 0932 01/17/14 0802  GLUCAP 93 99 172* 148* 106*       Signed:  Vannia Pola  [facility] 01/17/2014, 8:25 AM

## 2014-01-17 NOTE — Progress Notes (Signed)
Agree 

## 2014-01-17 NOTE — Discharge Instructions (Addendum)
Low-Fat Diet for Pancreatitis or Gallbladder Conditions A low-fat diet can be helpful if you have pancreatitis or a gallbladder condition. With these conditions, your pancreas and gallbladder have trouble digesting fats. A healthy eating plan with less fat will help rest your pancreas and gallbladder and reduce your symptoms. WHAT DO I NEED TO KNOW ABOUT THIS DIET?  Eat a low-fat diet.  Reduce your fat intake to less than 20-30% of your total daily calories. This is less than 50-60 g of fat per day.  Remember that you need some fat in your diet. Ask your dietician what your daily goal should be.  Choose nonfat and low-fat healthy foods. Look for the words "nonfat," "low fat," or "fat free."  As a guide, look on the label and choose foods with less than 3 g of fat per serving. Eat only one serving.  Avoid alcohol.  Do not smoke. If you need help quitting, talk with your health care provider.  Eat small frequent meals instead of three large heavy meals. WHAT FOODS CAN I EAT? Grains Include healthy grains and starches such as potatoes, wheat bread, fiber-rich cereal, and brown rice. Choose whole grain options whenever possible. In adults, whole grains should account for 45-65% of your daily calories.  Fruits and Vegetables Eat plenty of fruits and vegetables. Fresh fruits and vegetables add fiber to your diet. Meats and Other Protein Sources Eat lean meat such as chicken and pork. Trim any fat off of meat before cooking it. Eggs, fish, and beans are other sources of protein. In adults, these foods should account for 10-35% of your daily calories. Dairy Choose low-fat milk and dairy options. Dairy includes fat and protein, as well as calcium.  Fats and Oils Limit high-fat foods such as fried foods, sweets, baked goods, sugary drinks.  Other Creamy sauces and condiments, such as mayonnaise, can add extra fat. Think about whether or not you need to use them, or use smaller amounts or low fat  options. WHAT FOODS ARE NOT RECOMMENDED?  High fat foods, such as:  [employer].  Ice cream.  [place] toast.  Sweet rolls.  Pizza.  Cheese bread.  Foods covered with batter, butter, creamy sauces, or cheese.  Fried foods.  Sugary drinks and desserts.  Foods that cause gas or bloating Document Released: 05/25/2013 Document Reviewed: 05/25/2013 [facility] Patient Information 2015 [place], [place]. This information is not intended to replace advice given to you by your health care provider. Make sure you discuss any questions you have with your health care provider.  --------------------------------------------------------------------------------------- General Surgery: Your appointment is at 2:45pm, please arrive at least 30 min before your appointment to complete your check in paperwork.  If you are unable to arrive 30 min prior to your appointment time we may have to cancel or reschedule you.  LAPAROSCOPIC SURGERY: POST OP INSTRUCTIONS  1. DIET: Follow a light bland diet the first 24 hours after arrival home, such as soup, liquids, crackers, etc. Be sure to include lots of fluids daily. Avoid fast food or heavy meals as your are more likely to get nauseated. Eat a low fat the next few days after surgery.  2. Take your usually prescribed home medications unless otherwise directed. 3. PAIN CONTROL:  a. Pain is best controlled by a usual combination of three different methods TOGETHER:  i. Ice/Heat ii. Over the counter pain medication iii. Prescription pain medication b. Most patients will experience some swelling and bruising around the incisions. Ice packs or heating pads (  30-60 minutes up to 6 times a day) will help. Use ice for the first few days to help decrease swelling and bruising, then switch to heat to help relax tight/sore spots and speed recovery. Some people prefer to use ice alone, heat alone, alternating between ice & heat. Experiment to what works for you. Swelling and  bruising can take several weeks to resolve.  c. It is helpful to take an over-the-counter pain medication regularly for the first few weeks. Choose one of the following that works best for you:  i. Naproxen (Aleve, etc) Two 220mg  tabs twice a day ii. Ibuprofen (Advil, etc) Three 200mg  tabs four times a day (every meal & bedtime) iii. Acetaminophen (Tylenol, etc) 500-650mg  four times a day (every meal & bedtime) d. A prescription for pain medication (such as oxycodone, hydrocodone, etc) should be given to you upon discharge. Take your pain medication as prescribed.  i. If you are having problems/concerns with the prescription medicine (does not control pain, nausea, vomiting, rash, itching, etc), please call us [phone] to see if we need to switch you to a different pain medicine that will work better for you and/or control your side effect better. ii. If you need a refill on your pain medication, please contact your pharmacy. They will contact our office to request authorization. Prescriptions will not be filled after 5 pm or on week-ends. 4. Avoid getting constipated. Between the surgery and the pain medications, it is common to experience some constipation. Increasing fluid intake and taking a fiber supplement (such as Metamucil, Citrucel, FiberCon, MiraLax, etc) 1-2 times a day regularly will usually help prevent this problem from occurring. A mild laxative (prune juice, Milk of Magnesia, MiraLax, etc) should be taken according to package directions if there are no bowel movements after 48 hours.  5. Watch out for diarrhea. If you have many loose bowel movements, simplify your diet to bland foods & liquids for a few days. Stop any stool softeners and decrease your fiber supplement. Switching to mild anti-diarrheal medications (Kayopectate, Pepto Bismol) can help. If this worsens or does not improve, please call us. 6. Wash / shower every day. You may shower over the dressings as they are  waterproof. Continue to shower over incision(s) after the dressing is off. 7. Remove your waterproof bandages 5 days after surgery. You may leave the incision open to air. You may replace a dressing/Band-Aid to cover the incision for comfort if you wish.  8. ACTIVITIES as tolerated:  a. You may resume regular (light) daily activities beginning the next day--such as daily self-care, walking, climbing stairs--gradually increasing activities as tolerated. If you can walk 30 minutes without difficulty, it is safe to try more intense activity such as jogging, treadmill, bicycling, low-impact aerobics, swimming, etc. b. Save the most intensive and strenuous activity for last such as sit-ups, heavy lifting, contact sports, etc Refrain from any heavy lifting or straining until you are off narcotics for pain control.  c. DO NOT PUSH THROUGH PAIN. Let pain be your guide: If it hurts to do something, don't do it. Pain is your body warning you to avoid that activity for another week until the pain goes down. d. You may drive when you are no longer taking prescription pain medication, you can comfortably wear a seatbelt, and you can safely maneuver your car and apply brakes. e. Dennis Bast may have sexual intercourse when it is comfortable.  9. FOLLOW UP in our office  a. Please call [facility] at [phone]  [phone] to set up an appointment to see your surgeon in the office for a follow-up appointment approximately 2-3 weeks after your surgery. b. Make sure that you call for this appointment the day you arrive home to insure a convenient appointment time.      10. IF YOU HAVE DISABILITY OR FAMILY LEAVE FORMS, BRING THEM TO THE               OFFICE FOR PROCESSING.   WHEN TO CALL us [phone]:  1. Poor pain control 2. Reactions / problems with new medications (rash/itching, nausea, etc)  3. Fever over 101.5 F (38.5 C) 4. Inability to urinate 5. Nausea and/or vomiting 6. Worsening swelling or bruising 7. Continued bleeding  from incision. 8. Increased pain, redness, or drainage from the incision  The clinic staff is available to answer your questions during regular business hours (8:30am-5pm). Please dont hesitate to call and ask to speak to one of our nurses for clinical concerns.  If you have a medical emergency, go to the nearest emergency room or call 911.  A surgeon from [facility] is always on call at the [facility], [place], [place], [place],  [address] ?  MAIN: [phone] ? TOLL FREE: [phone] ?  FAX [phone]  [email or web address]

## 2014-01-17 NOTE — Progress Notes (Signed)
Patient is alert and oriented, discharge instructions reviewed with patient, patient to follow up with [facility] within 2 weeks, incisions are within normal limits, patient with stable vital signs, nicotine patch refused, patient tolerating diet and pain controlled with norco tabs, questions and concerns answered Neta Mends RN 01-17-2014 9:59am

## 2014-01-20 ENCOUNTER — Ambulatory Visit (INDEPENDENT_AMBULATORY_CARE_PROVIDER_SITE_OTHER): Payer: BC Managed Care – PPO | Admitting: Internal Medicine

## 2014-01-20 ENCOUNTER — Encounter: Payer: Self-pay | Admitting: Internal Medicine

## 2014-01-20 VITALS — BP 136/82 | HR 52 | Temp 96.4°F | Resp 16 | Wt 244.0 lb

## 2014-01-20 DIAGNOSIS — I10 Essential (primary) hypertension: Secondary | ICD-10-CM

## 2014-01-20 DIAGNOSIS — N289 Disorder of kidney and ureter, unspecified: Secondary | ICD-10-CM

## 2014-01-20 DIAGNOSIS — N2889 Other specified disorders of kidney and ureter: Secondary | ICD-10-CM

## 2014-01-20 NOTE — Progress Notes (Signed)
Subjective:    Patient ID: Jonathan Wilson, male    DOB: 1951/02/06, 63 y.o.   MRN: [id number]  HPI 63 yo recently widowed WM just 2-3 days out of the hospital after Lap Chole. W/u in hospital by CTscan & MRI of abdomen confirmed a 2.6 cm mass of the right upper kidney suspect for Kidney cancer. Patient is scheduled to see [name] in the next 1-2 weeks. Patient's  GI Sx's improved post surgery and he's taking a regular diet. No fever, chills, cough or congestion.  Medication Sig  . allopurinol 300 MG tablet Take 300 mg by mouth daily. PRN  . ALPRAZolam  1 MG tablet Take 1 mg by mouth 3 (three) times daily as needed for anxiety.  [name] aspirin EC 81 MG tablet Take 81 mg by mouth daily.  [name] atenolol  100 MG tablet Take 100 mg by mouth at bedtime.  [name] VITAMIN D 1000 UNITS  Take 5,000-10,000 Units by mouth daily.   . citalopram  40 MG tablet Take 40 mg by mouth daily.  [name] VITAMIN B-12  Take 1 tablet by mouth daily.  [name] lisinopril  20 MG tablet Take 40 mg by mouth every morning.  . MULTIVITAMIN WITH MINERALS Take 1 tablet by mouth every morning.    No Known Allergies  Past Medical History  Diagnosis Date  . Pneumonia     recurrent episodes   . Hypertension   . Hyperlipidemia   . Gout   . Vitamin D deficiency   . Hepatitis C before 1998-09-10  . Arthritis   . Obesity     300 # in September 09, 2008, BMI 34 in 04/2013.   . Right lower lobe lung mass 05/2008    resolved on follow up CT September 10, 2010.   [name] Psychosis 09-10-1998    s/e of treatment for Hepatitis C.    . Influenza A 06/2013    acute resp failure, did not require intubation.  . Diverticulosis of colon Sep 09, 2009    seen on CT scan in September 09, 2009.   [name] Anxiety and depression Sep 10, 1998    worse after death of wife 03-11-2013   Review of Systems In addition to the HPI above,  No Fever-chills,  No Headache, No changes with Vision or hearing,  No problems swallowing food or Liquids,  No Chest pain or productive Cough or Shortness of Breath,  No Abdominal pain, No Nausea or Vomitting,  Bowel movements are regular,  No Blood in stool or Urine,  No dysuria,  No new skin rashes or bruises,  No new joints pains-aches,  No new weakness, tingling, numbness in any extremity,  No recent weight loss,  No polyuria, polydypsia or polyphagia,  No significant Mental Stressors.  A full 10 point Review of Systems was done, except as stated above, all other Review of Systems were negative  Objective:   Physical Exam BP 136/82  Pulse 52  Temp(Src) 96.4 F (35.8 C) (Temporal)  Resp 16  Wt 244 lb (110.678 kg)  HEENT - Eac's patent. TM's Nl. EOM's full. PERRLA. NasoOroPharynx clear. Neck - supple. Nl Thyroid. Carotids 2+ & No bruits, nodes, JVD Chest - Clear equal BS w/o Rales, rhonchi, wheezes. Cor - Nl HS. RRR w/o sig MGR. PP 1(+). No edema. Abd - No palpable organomegaly, masses or tenderness. BS nl. MS- FROM w/o deformities. Muscle power, tone and bulk Nl. Gait Nl. Neuro - No obvious Cr N abnormalities. Sensory, motor and Cerebellar functions appear Nl w/o focal abnormalities. Psyche -  Mental status normal & appropriate.  No delusions, ideations or obvious mood abnormalities.  Assessment & Plan:   1. Hypertension - continue home monitoring & recommended diet/exercise as he recovers   2. Renal mass, right

## 2014-01-22 ENCOUNTER — Emergency Department (HOSPITAL_COMMUNITY)
Admission: EM | Admit: 2014-01-22 | Discharge: 2014-01-22 | Disposition: A | Discharge status: Home / Self Care | Payer: BC Managed Care – PPO | Attending: Emergency Medicine | Admitting: Emergency Medicine

## 2014-01-22 ENCOUNTER — Encounter (HOSPITAL_COMMUNITY): Payer: Self-pay | Admitting: Emergency Medicine

## 2014-01-22 DIAGNOSIS — R5381 Other malaise: Secondary | ICD-10-CM | POA: Diagnosis present

## 2014-01-22 DIAGNOSIS — F411 Generalized anxiety disorder: Secondary | ICD-10-CM | POA: Insufficient documentation

## 2014-01-22 DIAGNOSIS — Z9089 Acquired absence of other organs: Secondary | ICD-10-CM | POA: Diagnosis not present

## 2014-01-22 DIAGNOSIS — M129 Arthropathy, unspecified: Secondary | ICD-10-CM | POA: Insufficient documentation

## 2014-01-22 DIAGNOSIS — E559 Vitamin D deficiency, unspecified: Secondary | ICD-10-CM | POA: Insufficient documentation

## 2014-01-22 DIAGNOSIS — Z8619 Personal history of other infectious and parasitic diseases: Secondary | ICD-10-CM | POA: Insufficient documentation

## 2014-01-22 DIAGNOSIS — I10 Essential (primary) hypertension: Secondary | ICD-10-CM | POA: Diagnosis not present

## 2014-01-22 DIAGNOSIS — Z8719 Personal history of other diseases of the digestive system: Secondary | ICD-10-CM | POA: Diagnosis not present

## 2014-01-22 DIAGNOSIS — R1011 Right upper quadrant pain: Secondary | ICD-10-CM | POA: Insufficient documentation

## 2014-01-22 DIAGNOSIS — E785 Hyperlipidemia, unspecified: Secondary | ICD-10-CM | POA: Insufficient documentation

## 2014-01-22 DIAGNOSIS — R112 Nausea with vomiting, unspecified: Secondary | ICD-10-CM

## 2014-01-22 DIAGNOSIS — F17200 Nicotine dependence, unspecified, uncomplicated: Secondary | ICD-10-CM | POA: Insufficient documentation

## 2014-01-22 DIAGNOSIS — R5383 Other fatigue: Secondary | ICD-10-CM

## 2014-01-22 DIAGNOSIS — Z7982 Long term (current) use of aspirin: Secondary | ICD-10-CM | POA: Insufficient documentation

## 2014-01-22 DIAGNOSIS — G8918 Other acute postprocedural pain: Secondary | ICD-10-CM | POA: Diagnosis not present

## 2014-01-22 DIAGNOSIS — F3289 Other specified depressive episodes: Secondary | ICD-10-CM | POA: Diagnosis not present

## 2014-01-22 DIAGNOSIS — Z79899 Other long term (current) drug therapy: Secondary | ICD-10-CM | POA: Diagnosis not present

## 2014-01-22 DIAGNOSIS — F19939 Other psychoactive substance use, unspecified with withdrawal, unspecified: Secondary | ICD-10-CM | POA: Diagnosis not present

## 2014-01-22 DIAGNOSIS — M109 Gout, unspecified: Secondary | ICD-10-CM | POA: Insufficient documentation

## 2014-01-22 DIAGNOSIS — F329 Major depressive disorder, single episode, unspecified: Secondary | ICD-10-CM | POA: Diagnosis not present

## 2014-01-22 DIAGNOSIS — E669 Obesity, unspecified: Secondary | ICD-10-CM | POA: Diagnosis not present

## 2014-01-22 DIAGNOSIS — F13239 Sedative, hypnotic or anxiolytic dependence with withdrawal, unspecified: Secondary | ICD-10-CM

## 2014-01-22 DIAGNOSIS — Z8701 Personal history of pneumonia (recurrent): Secondary | ICD-10-CM | POA: Insufficient documentation

## 2014-01-22 DIAGNOSIS — F13939 Sedative, hypnotic or anxiolytic use, unspecified with withdrawal, unspecified: Secondary | ICD-10-CM

## 2014-01-22 LAB — COMPREHENSIVE METABOLIC PANEL
ALT: 24 U/L (ref 0–53)
AST: 22 U/L (ref 0–37)
Albumin: 4.7 g/dL (ref 3.5–5.2)
Alkaline Phosphatase: 92 U/L (ref 39–117)
Anion gap: 16 — ABNORMAL HIGH (ref 5–15)
BUN: 9 mg/dL (ref 6–23)
CHLORIDE: 96 meq/L (ref 96–112)
CO2: 26 mEq/L (ref 19–32)
Calcium: 10.8 mg/dL — ABNORMAL HIGH (ref 8.4–10.5)
Creatinine, Ser: 1.05 mg/dL (ref 0.50–1.35)
GFR calc Af Amer: 86 mL/min — ABNORMAL LOW (ref >90)
GFR calc non Af Amer: 74 mL/min — ABNORMAL LOW (ref >90)
Glucose, Bld: 110 mg/dL — ABNORMAL HIGH (ref 70–99)
POTASSIUM: 3.8 meq/L (ref 3.7–5.3)
SODIUM: 138 meq/L (ref 137–147)
Total Bilirubin: 0.9 mg/dL (ref 0.3–1.2)
Total Protein: 8.3 g/dL (ref 6.0–8.3)

## 2014-01-22 LAB — CBC WITH DIFFERENTIAL/PLATELET
BASOS ABS: 0 10*3/uL (ref 0.0–0.1)
Basophils Relative: 0 % (ref 0–1)
EOS PCT: 0 % (ref 0–5)
Eosinophils Absolute: 0 10*3/uL (ref 0.0–0.7)
HCT: 46.7 % (ref 39.0–52.0)
Hemoglobin: 17 g/dL (ref 13.0–17.0)
LYMPHS PCT: 17 % (ref 12–46)
Lymphs Abs: 1.4 10*3/uL (ref 0.7–4.0)
MCH: 33.2 pg (ref 26.0–34.0)
MCHC: 36.4 g/dL — ABNORMAL HIGH (ref 30.0–36.0)
MCV: 91.2 fL (ref 78.0–100.0)
Monocytes Absolute: 0.6 10*3/uL (ref 0.1–1.0)
Monocytes Relative: 7 % (ref 3–12)
NEUTROS ABS: 6 10*3/uL (ref 1.7–7.7)
NEUTROS PCT: 76 % (ref 43–77)
Platelets: 224 10*3/uL (ref 150–400)
RBC: 5.12 MIL/uL (ref 4.22–5.81)
RDW: 12.7 % (ref 11.5–15.5)
WBC: 8 10*3/uL (ref 4.0–10.5)

## 2014-01-22 LAB — I-STAT TROPONIN, ED: TROPONIN I, POC: 0 ng/mL (ref 0.00–0.08)

## 2014-01-22 LAB — LIPASE, BLOOD: Lipase: 27 U/L (ref 11–59)

## 2014-01-22 MED ORDER — ONDANSETRON HCL 4 MG/2ML IJ SOLN
4.0000 mg | Freq: Once | INTRAMUSCULAR | Status: AC
  Administered 2014-01-22: 4 mg via INTRAVENOUS
  Filled 2014-01-22: qty 2

## 2014-01-22 MED ORDER — LORAZEPAM 2 MG/ML IJ SOLN
1.0000 mg | Freq: Once | INTRAMUSCULAR | Status: AC
  Administered 2014-01-22: 1 mg via INTRAVENOUS
  Filled 2014-01-22: qty 1

## 2014-01-22 MED ORDER — SODIUM CHLORIDE 0.9 % IV BOLUS (SEPSIS)
2000.0000 mL | Freq: Once | INTRAVENOUS | Status: AC
  Administered 2014-01-22: 1000 mL via INTRAVENOUS

## 2014-01-22 MED ORDER — ONDANSETRON 8 MG PO TBDP: ORAL_TABLET | ORAL | Status: DC

## 2014-01-22 MED ORDER — ALPRAZOLAM 1 MG PO TABS: 1.0000 mg | ORAL_TABLET | Freq: Three times a day (TID) | ORAL | Status: DC | PRN

## 2014-01-22 NOTE — ED Notes (Addendum)
Patient is from home. Patient c/o Nausea and weakness that started on Tuesday. Patient states he was seen at primary care office on Thursday and he failed to mention the symptoms. Patient states that he think this is anxiety due to previous anxiety attack and psychic break. Patient states he feels anxious at this time. He takes xanax for this problem but patient does not have any more medication. He states it is 10 days too soon to refill.

## 2014-01-22 NOTE — ED Provider Notes (Signed)
CSN: [id number]     Arrival date & time 01/22/14  September 26, 1506 History   First MD Initiated Contact with Patient 01/22/14 1521     Chief Complaint  Patient presents with  . Weakness  . Nausea     (Consider location/radiation/quality/duration/timing/severity/associated sxs/prior Treatment) HPI 63 year old male had a laparoscopic cholecystectomy week ago he did fine the first few days but ran out of his Xanax 4-5 days ago and he now presents with a few days of feeling anxious generally weak and has 2 days of multiple episodes of nonbloody nausea and vomiting as well as a couple nonbloody loose stools today with no abdominal pain except for minimal tenderness to his right upper quadrant laparoscopic incision without pus drainage without redness around the incision with no cough no chest pain no shortness of breath no confusion no hallucinating no rash and no other concerns. Past Medical History  Diagnosis Date  . Pneumonia     recurrent episodes   . Hypertension   . Hyperlipidemia   . Gout   . Vitamin D deficiency   . Hepatitis C before 26-Sep-1998  . Arthritis   . Obesity     300 # in September 25, 2008, BMI 34 in 04/2013.   . Right lower lobe lung mass 05/2008    resolved on follow up CT 2010-09-26.   [name] Psychosis 09-26-1998    s/e of treatment for Hepatitis C.    . Influenza A 06/2013    acute resp failure, did not require intubation.  . Diverticulosis of colon 09-25-09    seen on CT scan in September 25, 2009.   [name] Anxiety and depression 09-26-98    worse after death of wife 03-27-2013   Past Surgical History  Procedure Laterality Date  . Mandible fracture surgery    . Cholecystectomy N/A 01/14/2014    Procedure: LAPAROSCOPIC CHOLECYSTECTOMY WITH INTRAOPERATIVE CHOLANGIOGRAM;  Surgeon: Shann Medal, MD;  Location: [facility];  Service: General;  Laterality: N/A;   Family History  Problem Relation Age of Onset  . Diabetes Mother   . Arthritis Mother   . Cancer Father     pancreatic cancer   History  Substance Use Topics  . Smoking  status: Current Every Day Smoker -- 0.25 packs/day for 40 years    Types: Cigarettes  . Smokeless tobacco: Former Systems developer    Quit date: 06/06/2013     Comment: as of 01/2014 smoking 1/2 PPD  . Alcohol Use: No     Comment: drinks occasional beer.  "2 dozen beers" over first 8 months of September 25, 2013.     Review of Systems 10 Systems reviewed and are negative for acute change except as noted in the HPI.   Allergies  Review of patient's allergies indicates no known allergies.  Home Medications   Prior to Admission medications   Medication Sig Start Date End Date Taking? Authorizing Provider  allopurinol (ZYLOPRIM) 300 MG tablet Take 300 mg by mouth every morning.    Yes Historical Provider, MD  ALPRAZolam Duanne Moron) 1 MG tablet Take 1 mg by mouth 3 (three) times daily as needed for anxiety.   Yes Historical Provider, MD  aspirin EC 81 MG tablet Take 81 mg by mouth every morning.    Yes Historical Provider, MD  atenolol (TENORMIN) 100 MG tablet Take 100 mg by mouth every morning.    Yes Historical Provider, MD  Cholecalciferol (VITAMIN D-3) 5000 UNITS TABS Take 5-10 tablets by mouth every morning.   Yes Historical Provider, MD  citalopram (CELEXA)  40 MG tablet Take 40 mg by mouth every morning.    Yes Historical Provider, MD  Cyanocobalamin (VITAMIN B-12 CR PO) Take 1 tablet by mouth every morning.    Yes Historical Provider, MD  HYDROcodone-acetaminophen (NORCO/VICODIN) 5-325 MG per tablet Take 1 tablet by mouth every 6 (six) hours as needed for moderate pain.   Yes Historical Provider, MD  lisinopril (PRINIVIL,ZESTRIL) 20 MG tablet Take 40 mg by mouth every morning.   Yes Historical Provider, MD  Multiple Vitamin (MULTIVITAMIN WITH MINERALS) TABS tablet Take 1 tablet by mouth every morning.    Yes Historical Provider, MD  ALPRAZolam Duanne Moron) 1 MG tablet Take 1 tablet (1 mg total) by mouth 3 (three) times daily as needed for anxiety. 01/22/14   Babette Relic, MD  ALPRAZolam Duanne Moron) 1 MG tablet TAKE  ONE-HALF TO ONE TABLET BY MOUTH THREE TIMES DAILY 01/27/14   Vicie Mutters, PA-C  ondansetron (ZOFRAN ODT) 8 MG disintegrating tablet 8mg  ODT q4 hours prn nausea 01/22/14   Babette Relic, MD   BP 181/96  Pulse 52  Temp(Src) 98.3 F (36.8 C) (Oral)  Resp 16  SpO2 99% Physical Exam  Nursing note and vitals reviewed. Constitutional:  Awake, alert, nontoxic appearance.  HENT:  Head: Atraumatic.  Eyes: Right eye exhibits no discharge. Left eye exhibits no discharge.  Neck: Neck supple.  Cardiovascular: Normal rate and regular rhythm.   No murmur heard. Pulmonary/Chest: Effort normal and breath sounds normal. No respiratory distress. He has no wheezes. He has no rales. He exhibits no tenderness.  Abdominal: Soft. Bowel sounds are normal. He exhibits no distension and no mass. There is tenderness. There is no rebound and no guarding.  Minimal incisional tenderness to his right upper quadrant laparoscopic wound which patient states has gradually improving since surgery with no tenderness to his other laparoscopic wound sites with no erythema no fluctuance no purulent drainage and no deep abdominal tenderness  Musculoskeletal: He exhibits no tenderness.  Baseline ROM, no obvious new focal weakness.  Neurological: He is alert.  Mental status and motor strength appears baseline for patient and situation.  Skin: No rash noted.  Psychiatric:  Anxious without suicidal or homicidal ideation or hallucinations    ED Course  Procedures (including critical care time) Pt feels improved after observation and/or treatment in ED.Patient / Family / Caregiver informed of clinical course, understand medical decision-making process, and agree with plan. Labs Review Labs Reviewed  CBC WITH DIFFERENTIAL - Abnormal; Notable for the following:    MCHC 36.4 (*)    All other components within normal limits  COMPREHENSIVE METABOLIC PANEL - Abnormal; Notable for the following:    Glucose, Bld 110 (*)    Calcium  10.8 (*)    GFR calc non Af Amer 74 (*)    GFR calc Af Amer 86 (*)    Anion gap 16 (*)    All other components within normal limits  LIPASE, BLOOD  I-STAT TROPOININ, ED    Imaging Review No results found.   EKG Interpretation   Date/Time:  Saturday January 22 2014 15:44:49 EDT Ventricular Rate:  56 PR Interval:  143 QRS Duration: 108 QT Interval:  454 QTC Calculation: 438 R Axis:   -60 Text Interpretation:  Sinus rhythm LAD, consider left anterior fascicular  block No significant change since last tracing Confirmed by [facility]  MD,  Jenny Reichmann ([id number]) on 01/22/2014 3:50:08 PM      MDM   Final diagnoses:  Benzodiazepine withdrawal, with unspecified  complication  Non-intractable vomiting with nausea, vomiting of unspecified type    I doubt any other EMC precluding discharge at this time including, but not necessarily limited to the following:severe withdrawal.    Babette Relic, MD 02/01/14 2007

## 2014-01-22 NOTE — Discharge Instructions (Signed)
Benzodiazepine Withdrawal  °Benzodiazepines are a group of drugs that are prescribed for both short-term and long-term treatment of a variety of medical conditions. For some of these conditions, such as seizures and sudden and severe muscle spasms, they are used only for a few hours or a few days. For other conditions, such as anxiety, sleep problems, or frequent muscle spasms or to help prevent seizures, they are used for an extended period, usually weeks or months. °Benzodiazepines work by changing the way your brain functions. Normally, chemicals in your brain called neurotransmitters send messages between your brain cells. The neurotransmitter that benzodiazepines affect is called gamma-aminobutyric acid (GABA). GABA sends out messages that have a calming effect on many of the functions of your brain. Benzodiazepines make these messages stronger and increase this calming effect. °Short-term use of benzodiazepines usually does not cause problems when you stop taking the drugs. However, if you take benzodiazepines for a long time, your body can adjust to the drug and require more of it to produce the same effect (drug tolerance). Eventually, you can develop physical dependence on benzodiazepines, which is when you experience negative effects if your dosage of benzodiazepines is reduced or stopped too quickly. These negative effects are called symptoms of withdrawal. °SYMPTOMS °Symptoms of withdrawal may begin anytime within the first 10 days after you stop taking the benzodiazepine. They can last from several weeks up to a few months but usually are the worst between the first 10 to 14 days.  °The actual symptoms also vary, depending on the type of benzodiazepine you take. Possible symptoms include: °· Anxiety. °· Excitability. °· Irritability. °· Depression. °· Mood swings. °· Trouble sleeping. °· Confusion. °· Uncontrollable shaking (tremors). °· Muscle weakness. °· Seizures. °DIAGNOSIS °To diagnose  benzodiazepine withdrawal, your caregiver will examine you for certain signs, such as: °· Rapid heartbeat. °· Rapid breathing. °· Tremors. °· High blood pressure. °· Fever. °· Mood changes. °Your caregiver also may ask the following questions about your use of benzodiazepines: °· What type of benzodiazepine did you take? °· How much did you take each day? °· How long did you take the drug? °· When was the last time you took the drug? °· Do you take any other drugs? °· Have you had alcohol recently? °· Have you had a seizure recently? °· Have you lost consciousness recently? °· Have you had trouble remembering recent events? °· Have you had a recent increase in anxiety, irritability, or trouble sleeping? °A drug test also may be administered. °TREATMENT °The treatment for benzodiazepine withdrawal can vary, depending on the type and severity of your symptoms, what type of benzodiazepine you have been taking, and how long you have been taking the benzodiazepine. Sometimes it is necessary for you to be treated in a hospital, especially if you are at risk of seizures.  °Often, treatment includes a prescription for a long-acting benzodiazepine, the dosage of which is reduced slowly over a long period. This period could be several weeks or months. Eventually, your dosage will be reduced to a point that you can stop taking the drug, without experiencing withdrawal symptoms. This is called tapered withdrawal. Occasionally, minor symptoms of withdrawal continue for a few days or weeks after you have completed a tapered withdrawal. °SEEK IMMEDIATE MEDICAL CARE IF: °· You have a seizure. °· You develop a craving for drugs or alcohol. °· You begin to experience symptoms of withdrawal during your tapered withdrawal. °· You become very confused. °· You lose consciousness. °· You   have trouble breathing.  You think about hurting yourself or someone else. Document Released: 05/09/2011 Document Revised: 08/12/2011 Document  Reviewed: 05/09/2011 [facility] Patient Information 2015 [place], [place]. This information is not intended to replace advice given to you by your health care provider. Make sure you discuss any questions you have with your health care provider.  Abdominal (belly) pain can be caused by many things. Your caregiver performed an examination and possibly ordered blood/urine tests and imaging (CT scan, x-rays, ultrasound). Many cases can be observed and treated at home after initial evaluation in the emergency department. Even though you are being discharged home, abdominal pain can be unpredictable. Therefore, you need a repeated exam if your pain does not resolve, returns, or worsens. Most patients with abdominal pain don't have to be admitted to the hospital or have surgery, but serious problems like appendicitis and gallbladder attacks can start out as nonspecific pain. Many abdominal conditions cannot be diagnosed in one visit, so follow-up evaluations are very important. SEEK IMMEDIATE MEDICAL ATTENTION IF: The pain does not go away or becomes severe.  A temperature above 101 develops.  Repeated vomiting occurs (multiple episodes).  The pain becomes localized to portions of the abdomen. The right side could possibly be appendicitis. In an adult, the left lower portion of the abdomen could be colitis or diverticulitis.  Blood is being passed in stools or vomit (bright red or black tarry stools).  Return also if you develop chest pain, difficulty breathing, dizziness or fainting, or become confused, poorly responsive, or inconsolable (young children).

## 2014-01-27 ENCOUNTER — Other Ambulatory Visit: Payer: Self-pay | Admitting: Physician Assistant

## 2014-02-08 ENCOUNTER — Encounter (INDEPENDENT_AMBULATORY_CARE_PROVIDER_SITE_OTHER): Payer: BC Managed Care – PPO

## 2014-02-09 ENCOUNTER — Encounter: Payer: Self-pay | Admitting: Gastroenterology

## 2014-03-11 ENCOUNTER — Other Ambulatory Visit: Payer: Self-pay | Admitting: Emergency Medicine

## 2014-03-11 ENCOUNTER — Other Ambulatory Visit: Payer: Self-pay | Admitting: Physician Assistant

## 2014-03-23 ENCOUNTER — Other Ambulatory Visit: Payer: Self-pay | Admitting: Physician Assistant

## 2014-03-25 ENCOUNTER — Other Ambulatory Visit: Payer: Self-pay | Admitting: Physician Assistant

## 2014-04-18 ENCOUNTER — Other Ambulatory Visit: Payer: Self-pay | Admitting: Emergency Medicine

## 2014-04-21 ENCOUNTER — Other Ambulatory Visit: Payer: Self-pay | Admitting: Physician Assistant

## 2014-04-22 ENCOUNTER — Ambulatory Visit: Payer: Self-pay | Admitting: Internal Medicine

## 2014-04-22 ENCOUNTER — Other Ambulatory Visit: Payer: Self-pay | Admitting: Emergency Medicine

## 2014-04-23 ENCOUNTER — Emergency Department (HOSPITAL_COMMUNITY)
Admission: EM | Admit: 2014-04-23 | Discharge: 2014-04-23 | Disposition: A | Discharge status: Home / Self Care | Payer: BC Managed Care – PPO | Attending: Emergency Medicine | Admitting: Emergency Medicine

## 2014-04-23 ENCOUNTER — Encounter (HOSPITAL_COMMUNITY): Payer: Self-pay

## 2014-04-23 DIAGNOSIS — Z720 Tobacco use: Secondary | ICD-10-CM | POA: Insufficient documentation

## 2014-04-23 DIAGNOSIS — M109 Gout, unspecified: Secondary | ICD-10-CM | POA: Insufficient documentation

## 2014-04-23 DIAGNOSIS — Z8709 Personal history of other diseases of the respiratory system: Secondary | ICD-10-CM | POA: Insufficient documentation

## 2014-04-23 DIAGNOSIS — Z791 Long term (current) use of non-steroidal anti-inflammatories (NSAID): Secondary | ICD-10-CM | POA: Insufficient documentation

## 2014-04-23 DIAGNOSIS — Z760 Encounter for issue of repeat prescription: Secondary | ICD-10-CM

## 2014-04-23 DIAGNOSIS — E559 Vitamin D deficiency, unspecified: Secondary | ICD-10-CM | POA: Insufficient documentation

## 2014-04-23 DIAGNOSIS — Z8719 Personal history of other diseases of the digestive system: Secondary | ICD-10-CM | POA: Insufficient documentation

## 2014-04-23 DIAGNOSIS — M1990 Unspecified osteoarthritis, unspecified site: Secondary | ICD-10-CM | POA: Insufficient documentation

## 2014-04-23 DIAGNOSIS — R61 Generalized hyperhidrosis: Secondary | ICD-10-CM | POA: Insufficient documentation

## 2014-04-23 DIAGNOSIS — Z8701 Personal history of pneumonia (recurrent): Secondary | ICD-10-CM | POA: Insufficient documentation

## 2014-04-23 DIAGNOSIS — R0789 Other chest pain: Secondary | ICD-10-CM | POA: Insufficient documentation

## 2014-04-23 DIAGNOSIS — Z8781 Personal history of (healed) traumatic fracture: Secondary | ICD-10-CM | POA: Insufficient documentation

## 2014-04-23 DIAGNOSIS — E669 Obesity, unspecified: Secondary | ICD-10-CM | POA: Insufficient documentation

## 2014-04-23 DIAGNOSIS — Z7982 Long term (current) use of aspirin: Secondary | ICD-10-CM | POA: Insufficient documentation

## 2014-04-23 DIAGNOSIS — R0602 Shortness of breath: Secondary | ICD-10-CM | POA: Insufficient documentation

## 2014-04-23 DIAGNOSIS — R509 Fever, unspecified: Secondary | ICD-10-CM | POA: Insufficient documentation

## 2014-04-23 DIAGNOSIS — F418 Other specified anxiety disorders: Secondary | ICD-10-CM | POA: Insufficient documentation

## 2014-04-23 DIAGNOSIS — Z8619 Personal history of other infectious and parasitic diseases: Secondary | ICD-10-CM | POA: Insufficient documentation

## 2014-04-23 DIAGNOSIS — I10 Essential (primary) hypertension: Secondary | ICD-10-CM | POA: Insufficient documentation

## 2014-04-23 MED ORDER — ALPRAZOLAM 1 MG PO TABS: 1.0000 mg | ORAL_TABLET | Freq: Three times a day (TID) | ORAL | Status: DC | PRN

## 2014-04-23 NOTE — ED Provider Notes (Signed)
CSN: [id number]     Arrival date & time 04/23/14  1447 History  This chart was scribed for a non-physician practitioner, Glendell Docker, working with Babette Relic, MD by Martinique Peace, ED Scribe. The patient was seen in [facility]. The patient's care was started at [facility] [place] [facility].    Chief Complaint  Patient presents with  . Medication Refill    The history is provided by the patient. No language interpreter was used.  HPI Comments: Jonathan Wilson is a 63 y.o. male who presents to the Emergency Department seeking medication refill of xanax. Pt last took xanax on Thursday. He denies nausea or vomiting. He reports withdrawal symptoms due to running out medication including anxious, fever, chills, and sweating. Pt states he has tried contacting PCP to get medication refilled but was told he is no longer a patient of the physician that prescribed his medication. Pt has medication bottle with him that shows he had prescription filled a month ago (03/23/2014). He adds that he is "addicted" to the medication and has been taking more of it than he has been prescribed.   Past Medical History  Diagnosis Date  . Pneumonia     recurrent episodes   . Hypertension   . Hyperlipidemia   . Gout   . Vitamin D deficiency   . Hepatitis C before 28-Sep-1998  . Arthritis   . Obesity     300 # in September 27, 2008, BMI 34 in 04/2013.   . Right lower lobe lung mass 05/2008    resolved on follow up CT 09-28-2010.   [name] Psychosis 09/28/98    s/e of treatment for Hepatitis C.    . Influenza A 06/2013    acute resp failure, did not require intubation.  . Diverticulosis of colon 2009/09/27    seen on CT scan in 09/27/2009.   [name] Anxiety and depression 09/28/98    worse after death of wife 29-Mar-2013   Past Surgical History  Procedure Laterality Date  . Mandible fracture surgery    . Cholecystectomy N/A 01/14/2014    Procedure: LAPAROSCOPIC CHOLECYSTECTOMY WITH INTRAOPERATIVE CHOLANGIOGRAM;  Surgeon: Shann Medal, MD;  Location: [facility];  Service: General;   Laterality: N/A;   Family History  Problem Relation Age of Onset  . Diabetes Mother   . Arthritis Mother   . Cancer Father     pancreatic cancer   History  Substance Use Topics  . Smoking status: Current Every Day Smoker -- 0.25 packs/day for 40 years    Types: Cigarettes  . Smokeless tobacco: Former Systems developer    Quit date: 06/06/2013     Comment: as of 01/2014 smoking 1/2 PPD  . Alcohol Use: No     Comment: drinks occasional beer.  "2 dozen beers" over first 8 months of 2013-09-27.     Review of Systems  Constitutional: Positive for fever, chills and diaphoresis.  Respiratory: Positive for shortness of breath.   Cardiovascular: Positive for chest pain.  Gastrointestinal: Negative for nausea and vomiting.  Psychiatric/Behavioral: The patient is nervous/anxious.   All other systems reviewed and are negative.     Allergies  Review of patient's allergies indicates no known allergies.  Home Medications   Prior to Admission medications   Medication Sig Start Date End Date Taking? Authorizing Provider  allopurinol (ZYLOPRIM) 300 MG tablet Take 300 mg by mouth every morning.     Historical Provider, MD  allopurinol (ZYLOPRIM) 300 MG tablet TAKE ONE TABLET BY MOUTH ONCE DAILY TO  PREVENT  GOUT 03/12/14   Vicie Mutters, PA-C  ALPRAZolam Duanne Moron) 1 MG tablet Take 1 mg by mouth 3 (three) times daily as needed for anxiety.    Historical Provider, MD  ALPRAZolam Duanne Moron) 1 MG tablet Take 1 tablet (1 mg total) by mouth 3 (three) times daily as needed for anxiety. 01/22/14   Babette Relic, MD  ALPRAZolam Duanne Moron) 1 MG tablet TAKE ONE-HALF TO ONE TABLET BY MOUTH THREE TIMES DAILY 01/27/14   Vicie Mutters, PA-C  ALPRAZolam (XANAX) 1 MG tablet TAKE ONE-HALF TO ONE TABLET BY MOUTH THREE TIMES DAILY. 03/23/14   Vicie Mutters, PA-C  ALPRAZolam (XANAX) 1 MG tablet TAKE ONE-HALF TO ONE TABLET BY MOUTH THREE TIMES DAILY. 03/24/14   Vicie Mutters, PA-C  aspirin EC 81 MG tablet Take 81 mg by mouth every  morning.     Historical Provider, MD  atenolol (TENORMIN) 100 MG tablet Take 100 mg by mouth every morning.     Historical Provider, MD  Cholecalciferol (VITAMIN D-3) 5000 UNITS TABS Take 5-10 tablets by mouth every morning.    Historical Provider, MD  citalopram (CELEXA) 40 MG tablet Take 40 mg by mouth every morning.     Historical Provider, MD  citalopram (CELEXA) 40 MG tablet TAKE ONE TABLET BY MOUTH ONCE DAILY 03/12/14   Vicie Mutters, PA-C  Cyanocobalamin (VITAMIN B-12 CR PO) Take 1 tablet by mouth every morning.     Historical Provider, MD  HYDROcodone-acetaminophen (NORCO/VICODIN) 5-325 MG per tablet Take 1 tablet by mouth every 6 (six) hours as needed for moderate pain.    Historical Provider, MD  indomethacin (INDOCIN) 50 MG capsule TAKE ONE CAPSULE BY MOUTH ONCE DAILY FOR PAIN 03/26/14   Unk Pinto, MD  lisinopril (PRINIVIL,ZESTRIL) 20 MG tablet Take 40 mg by mouth every morning.    Historical Provider, MD  lisinopril (PRINIVIL,ZESTRIL) 20 MG tablet TAKE ONE TABLET BY MOUTH TWICE DAILY 04/18/14   Unk Pinto, MD  Multiple Vitamin (MULTIVITAMIN WITH MINERALS) TABS tablet Take 1 tablet by mouth every morning.     Historical Provider, MD  ondansetron (ZOFRAN ODT) 8 MG disintegrating tablet 8mg  ODT q4 hours prn nausea 01/22/14   Babette Relic, MD   BP 168/97 mmHg  Pulse 69  Temp(Src) 98.6 F (37 C) (Oral)  Resp 16  Ht 6\' 3"  (1.905 m)  Wt 244 lb (110.678 kg)  BMI 30.50 kg/m2  SpO2 100% Physical Exam  Constitutional: He is oriented to person, place, and time. He appears well-developed and well-nourished. No distress.  HENT:  Head: Normocephalic and atraumatic.  Eyes: Conjunctivae and EOM are normal.  Neck: Neck supple. No tracheal deviation present.  Cardiovascular: Normal rate.   Pulmonary/Chest: Effort normal. No respiratory distress.  Musculoskeletal: Normal range of motion.  Neurological: He is alert and oriented to person, place, and time.  Skin: Skin is warm and  dry.  Psychiatric: He has a normal mood and affect. His behavior is normal. He expresses no homicidal and no suicidal ideation.  Nursing note and vitals reviewed.   ED Course  Procedures (including critical care time) Labs Review Labs Reviewed - No data to display  Imaging Review No results found.   EKG Interpretation None     Medications - No data to display  3:29 PM- Treatment plan was discussed with patient who verbalizes understanding and agrees.   MDM   Final diagnoses:  Medication refill    Pt not displaying any signs of withdrawal in the er. Pt last  took medications 2 days ago. Will given enough for pt to make it until Monday and he can work things out with pcp. No si/hi  I personally performed the services described in this documentation, which was scribed in my presence. The recorded information has been reviewed and is accurate.   Glendell Docker, NP 04/23/14 1535  Babette Relic, MD 04/28/14 [phone]

## 2014-04-23 NOTE — Discharge Instructions (Signed)
Medication Refill, Emergency Department °We have refilled your medication today as a courtesy to you. It is best for your medical care, however, to take care of getting refills done through your primary caregiver's office. They have your records and can do a better job of follow-up than we can in the emergency department. °On maintenance medications, we often only prescribe enough medications to get you by until you are able to see your regular caregiver. This is a more expensive way to refill medications. °In the future, please plan for refills so that you will not have to use the emergency department for this. °Thank you for your help. Your help allows us to better take care of the daily emergencies that enter our department. °Document Released: 09/06/2003 Document Revised: 08/12/2011 Document Reviewed: 08/27/2013 °ExitCare® Patient Information ©2015 [employer]. This information is not intended to replace advice given to you by your health care provider. Make sure you discuss any questions you have with your health care provider. ° °

## 2014-04-23 NOTE — ED Notes (Addendum)
Pt presents with NAD- Pt here for medication refill- last taken xanax on Thursday. States PCP would not refill "found out was no longer his pt" "made attempts to contact PCP without success". Anxious, chills, sweats. Pt presents RX bottle- bottle is empty. Pt admits to having to take more than prescribed.

## 2014-04-25 ENCOUNTER — Telehealth (HOSPITAL_COMMUNITY): Payer: Self-pay | Admitting: *Deleted

## 2014-04-25 NOTE — Telephone Encounter (Signed)
Patient left VM: MD has released him from care. Asked for Xanax refill on Friday,was denied. Went to ED over weekend and was able to get enough Xanax to last until today. Found out this morning that MD has released him. 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 Next step is anxiety attacks and withdrawal. Requests call.  Phoned patient, left message: Per pt records in EMR, patient is not currently, nor has ever been under care of provider in any [facility] Outpatient Office. Unsure from his message which MD discharged him.According to records, he has not received care from any provider in this or other [facility] OP office with exception of therapist in 2012. Due to that, will be unable to send message to any provider to request refill. Recommended he contact his primary care/Internal medicine MD for follow up.

## 2014-05-08 ENCOUNTER — Other Ambulatory Visit: Payer: Self-pay

## 2014-05-08 ENCOUNTER — Observation Stay (HOSPITAL_COMMUNITY)
Admission: AD | Admit: 2014-05-08 | Discharge: 2014-05-09 | Disposition: A | Discharge status: Home / Self Care | Payer: BC Managed Care – PPO | Source: Intra-hospital | Attending: Psychiatry | Admitting: Psychiatry

## 2014-05-08 ENCOUNTER — Encounter (HOSPITAL_COMMUNITY): Payer: Self-pay | Admitting: *Deleted

## 2014-05-08 ENCOUNTER — Emergency Department (HOSPITAL_COMMUNITY)
Admission: EM | Admit: 2014-05-08 | Discharge: 2014-05-08 | Disposition: A | Discharge status: Other Acute Inpatient Hospital | Payer: BC Managed Care – PPO | Attending: Emergency Medicine | Admitting: Emergency Medicine

## 2014-05-08 ENCOUNTER — Encounter (HOSPITAL_COMMUNITY): Payer: Self-pay | Admitting: Emergency Medicine

## 2014-05-08 DIAGNOSIS — B1920 Unspecified viral hepatitis C without hepatic coma: Secondary | ICD-10-CM | POA: Insufficient documentation

## 2014-05-08 DIAGNOSIS — F329 Major depressive disorder, single episode, unspecified: Secondary | ICD-10-CM | POA: Insufficient documentation

## 2014-05-08 DIAGNOSIS — M1990 Unspecified osteoarthritis, unspecified site: Secondary | ICD-10-CM | POA: Insufficient documentation

## 2014-05-08 DIAGNOSIS — Z8619 Personal history of other infectious and parasitic diseases: Secondary | ICD-10-CM | POA: Insufficient documentation

## 2014-05-08 DIAGNOSIS — M109 Gout, unspecified: Secondary | ICD-10-CM | POA: Insufficient documentation

## 2014-05-08 DIAGNOSIS — F1994 Other psychoactive substance use, unspecified with psychoactive substance-induced mood disorder: Secondary | ICD-10-CM | POA: Insufficient documentation

## 2014-05-08 DIAGNOSIS — F29 Unspecified psychosis not due to a substance or known physiological condition: Secondary | ICD-10-CM | POA: Insufficient documentation

## 2014-05-08 DIAGNOSIS — Z79899 Other long term (current) drug therapy: Secondary | ICD-10-CM | POA: Insufficient documentation

## 2014-05-08 DIAGNOSIS — Z8701 Personal history of pneumonia (recurrent): Secondary | ICD-10-CM | POA: Insufficient documentation

## 2014-05-08 DIAGNOSIS — E559 Vitamin D deficiency, unspecified: Secondary | ICD-10-CM | POA: Insufficient documentation

## 2014-05-08 DIAGNOSIS — R441 Visual hallucinations: Secondary | ICD-10-CM | POA: Insufficient documentation

## 2014-05-08 DIAGNOSIS — I10 Essential (primary) hypertension: Secondary | ICD-10-CM | POA: Insufficient documentation

## 2014-05-08 DIAGNOSIS — R0789 Other chest pain: Secondary | ICD-10-CM | POA: Insufficient documentation

## 2014-05-08 DIAGNOSIS — E669 Obesity, unspecified: Secondary | ICD-10-CM | POA: Insufficient documentation

## 2014-05-08 DIAGNOSIS — F332 Major depressive disorder, recurrent severe without psychotic features: Principal | ICD-10-CM | POA: Insufficient documentation

## 2014-05-08 DIAGNOSIS — F419 Anxiety disorder, unspecified: Secondary | ICD-10-CM

## 2014-05-08 DIAGNOSIS — Z7982 Long term (current) use of aspirin: Secondary | ICD-10-CM | POA: Insufficient documentation

## 2014-05-08 DIAGNOSIS — F411 Generalized anxiety disorder: Secondary | ICD-10-CM | POA: Insufficient documentation

## 2014-05-08 DIAGNOSIS — Z720 Tobacco use: Secondary | ICD-10-CM | POA: Insufficient documentation

## 2014-05-08 DIAGNOSIS — R0602 Shortness of breath: Secondary | ICD-10-CM | POA: Insufficient documentation

## 2014-05-08 DIAGNOSIS — E785 Hyperlipidemia, unspecified: Secondary | ICD-10-CM | POA: Insufficient documentation

## 2014-05-08 DIAGNOSIS — K5730 Diverticulosis of large intestine without perforation or abscess without bleeding: Secondary | ICD-10-CM | POA: Insufficient documentation

## 2014-05-08 DIAGNOSIS — F17210 Nicotine dependence, cigarettes, uncomplicated: Secondary | ICD-10-CM | POA: Insufficient documentation

## 2014-05-08 LAB — RAPID URINE DRUG SCREEN, HOSP PERFORMED
AMPHETAMINES: NOT DETECTED
BENZODIAZEPINES: NOT DETECTED
Barbiturates: NOT DETECTED
Cocaine: NOT DETECTED
OPIATES: NOT DETECTED
Tetrahydrocannabinol: NOT DETECTED

## 2014-05-08 LAB — COMPREHENSIVE METABOLIC PANEL
ALT: 46 U/L (ref 0–53)
AST: 32 U/L (ref 0–37)
Albumin: 4.7 g/dL (ref 3.5–5.2)
Alkaline Phosphatase: 88 U/L (ref 39–117)
Anion gap: 16 — ABNORMAL HIGH (ref 5–15)
BUN: 11 mg/dL (ref 6–23)
CALCIUM: 10.3 mg/dL (ref 8.4–10.5)
CO2: 26 mEq/L (ref 19–32)
CREATININE: 0.96 mg/dL (ref 0.50–1.35)
Chloride: 97 mEq/L (ref 96–112)
GFR calc non Af Amer: 87 mL/min — ABNORMAL LOW (ref >90)
Glucose, Bld: 103 mg/dL — ABNORMAL HIGH (ref 70–99)
Potassium: 4.7 mEq/L (ref 3.7–5.3)
SODIUM: 139 meq/L (ref 137–147)
Total Bilirubin: 1.1 mg/dL (ref 0.3–1.2)
Total Protein: 8 g/dL (ref 6.0–8.3)

## 2014-05-08 LAB — SALICYLATE LEVEL

## 2014-05-08 LAB — CBC
HCT: 46.4 % (ref 39.0–52.0)
Hemoglobin: 16.2 g/dL (ref 13.0–17.0)
MCH: 33.2 pg (ref 26.0–34.0)
MCHC: 34.9 g/dL (ref 30.0–36.0)
MCV: 95.1 fL (ref 78.0–100.0)
PLATELETS: 167 10*3/uL (ref 150–400)
RBC: 4.88 MIL/uL (ref 4.22–5.81)
RDW: 12.6 % (ref 11.5–15.5)
WBC: 7.2 10*3/uL (ref 4.0–10.5)

## 2014-05-08 LAB — ACETAMINOPHEN LEVEL: Acetaminophen (Tylenol), Serum: <15 ug/mL (ref 10–30)

## 2014-05-08 LAB — TROPONIN I

## 2014-05-08 LAB — ETHANOL: Alcohol, Ethyl (B): <11 mg/dL (ref 0–11)

## 2014-05-08 MED ORDER — LOPERAMIDE HCL 2 MG PO CAPS: 2.0000 mg | ORAL_CAPSULE | ORAL | Status: DC | PRN

## 2014-05-08 MED ORDER — ADULT MULTIVITAMIN W/MINERALS CH
1.0000 | ORAL_TABLET | Freq: Every day | ORAL | Status: DC
  Administered 2014-05-08: 1 via ORAL
  Filled 2014-05-08: qty 1

## 2014-05-08 MED ORDER — VITAMIN B-1 100 MG PO TABS: 100.0000 mg | ORAL_TABLET | Freq: Every day | ORAL | Status: DC

## 2014-05-08 MED ORDER — CHLORDIAZEPOXIDE HCL 25 MG PO CAPS: 25.0000 mg | ORAL_CAPSULE | Freq: Four times a day (QID) | ORAL | Status: DC | PRN

## 2014-05-08 MED ORDER — ONDANSETRON 4 MG PO TBDP: 4.0000 mg | ORAL_TABLET | Freq: Four times a day (QID) | ORAL | Status: DC | PRN

## 2014-05-08 MED ORDER — CHLORDIAZEPOXIDE HCL 25 MG PO CAPS
25.0000 mg | ORAL_CAPSULE | Freq: Four times a day (QID) | ORAL | Status: DC
  Administered 2014-05-08: 25 mg via ORAL
  Filled 2014-05-08: qty 1

## 2014-05-08 MED ORDER — CHLORDIAZEPOXIDE HCL 25 MG PO CAPS: 25.0000 mg | ORAL_CAPSULE | Freq: Three times a day (TID) | ORAL | Status: DC

## 2014-05-08 MED ORDER — CHLORDIAZEPOXIDE HCL 25 MG PO CAPS: 25.0000 mg | ORAL_CAPSULE | Freq: Every day | ORAL | Status: DC

## 2014-05-08 MED ORDER — HYDROXYZINE HCL 25 MG PO TABS
25.0000 mg | ORAL_TABLET | Freq: Four times a day (QID) | ORAL | Status: DC | PRN
  Administered 2014-05-08: 25 mg via ORAL
  Filled 2014-05-08: qty 1

## 2014-05-08 MED ORDER — TRAZODONE HCL 100 MG PO TABS
100.0000 mg | ORAL_TABLET | Freq: Every day | ORAL | Status: DC
  Administered 2014-05-08: 100 mg via ORAL
  Filled 2014-05-08 (×3): qty 1

## 2014-05-08 MED ORDER — CHLORDIAZEPOXIDE HCL 25 MG PO CAPS: 25.0000 mg | ORAL_CAPSULE | ORAL | Status: DC

## 2014-05-08 MED ORDER — THIAMINE HCL 100 MG/ML IJ SOLN
100.0000 mg | Freq: Once | INTRAMUSCULAR | Status: AC
Start: 2014-05-08 — End: 2014-05-08
  Administered 2014-05-08: 100 mg via INTRAMUSCULAR
  Filled 2014-05-08: qty 2

## 2014-05-08 NOTE — ED Notes (Addendum)
Report given to [place] @ observation at [facility].  Patient will be ready for transfer to [facility] this evening.

## 2014-05-08 NOTE — ED Notes (Signed)
TTS speaking to pt via webcam

## 2014-05-08 NOTE — Progress Notes (Signed)
[facility] INPATIENT:  Family/Significant Other Suicide Prevention Education  Suicide Prevention Education:  Patient Refusal for Family/Significant Other Suicide Prevention Education: The patient Jonathan Wilson has refused to provide written consent for family/significant other to be provided Family/Significant Other Suicide Prevention Education during admission and/or prior to discharge.  Physician notified.  Loyal Gambler B 05/08/2014, 11:45 PM

## 2014-05-08 NOTE — ED Provider Notes (Signed)
Accepted care from Dr. Betsey Holiday. 7M here w/ anxiety after his pcp would not refill his xanax. Medically cleared. Seen by TTS who recommends librium and reassessment in the AM.   7:31 PM Pt accepted to [facility]. Will transfer.   Pamella Pert, MD 05/08/14 [phone]

## 2014-05-08 NOTE — ED Provider Notes (Signed)
CSN: [id number]     Arrival date & time 05/08/14  1339 History   First MD Initiated Contact with Patient 05/08/14 1354     Chief Complaint  Patient presents with  . Medical Clearance    off xanax 5 days, feels he will soon have suicidal thoughts     (Consider location/radiation/quality/duration/timing/severity/associated sxs/prior Treatment) HPI Comments: Patient presents to the ER for evaluation of severe anxiety. Patient reports that his doctor would not refill his prescription for Xanax, has not taken it in approximately 8 days. For the last 4 or 5 days he has been having almost constant anxiety and panic attacks. Patient reports that he feels "like I am in hell" reports racing thoughts, severe anxiety, impending doom sensation. He has been experiencing some shortness of breath and feeling like his chest is tight. This worsens when he becomes more anxious. Patient denies active homicidality or suicidality. He does, however, report that he is concerned he will become suicidal if he does not get help.   Past Medical History  Diagnosis Date  . Pneumonia     recurrent episodes   . Hypertension   . Hyperlipidemia   . Gout   . Vitamin D deficiency   . Hepatitis C before 09-Sep-1998  . Arthritis   . Obesity     300 # in 08-Sep-2008, BMI 34 in 04/2013.   . Right lower lobe lung mass 05/2008    resolved on follow up CT September 09, 2010.   [name] Psychosis 09-Sep-1998    s/e of treatment for Hepatitis C.    . Influenza A 06/2013    acute resp failure, did not require intubation.  . Diverticulosis of colon 09-08-09    seen on CT scan in 08-Sep-2009.   [name] Anxiety and depression 1998-09-09    worse after death of wife 03-10-2013   Past Surgical History  Procedure Laterality Date  . Mandible fracture surgery    . Cholecystectomy N/A 01/14/2014    Procedure: LAPAROSCOPIC CHOLECYSTECTOMY WITH INTRAOPERATIVE CHOLANGIOGRAM;  Surgeon: Shann Medal, MD;  Location: [facility] ORS;  Service: General;  Laterality: N/A;   Family History  Problem Relation Age  of Onset  . Diabetes Mother   . Arthritis Mother   . Cancer Father     pancreatic cancer   History  Substance Use Topics  . Smoking status: Current Every Day Smoker -- 0.25 packs/day for 40 years    Types: Cigarettes  . Smokeless tobacco: Former Systems developer    Quit date: 06/06/2013     Comment: as of 01/2014 smoking 1/2 PPD  . Alcohol Use: No     Comment: drinks occasional beer.  "2 dozen beers" over first 8 months of 2013/09/08.     Review of Systems  Respiratory: Positive for chest tightness and shortness of breath.   Psychiatric/Behavioral: Positive for agitation. The patient is nervous/anxious.   All other systems reviewed and are negative.     Allergies  Review of patient's allergies indicates no known allergies.  Home Medications   Prior to Admission medications   Medication Sig Start Date End Date Taking? Authorizing Provider  allopurinol (ZYLOPRIM) 300 MG tablet Take 300 mg by mouth every morning.    Yes Historical Provider, MD  ALPRAZolam Duanne Moron) 1 MG tablet Take 1 mg by mouth 3 (three) times daily.   Yes Historical Provider, MD  aspirin EC 81 MG tablet Take 81 mg by mouth every morning.    Yes Historical Provider, MD  atenolol (TENORMIN) 100 MG tablet  Take 100 mg by mouth at bedtime.    Yes Historical Provider, MD  Cholecalciferol (VITAMIN D-3) 5000 UNITS TABS Take 5-10 tablets by mouth every morning.   Yes Historical Provider, MD  lisinopril (PRINIVIL,ZESTRIL) 20 MG tablet Take 40 mg by mouth every morning.   Yes Historical Provider, MD  Multiple Vitamin (MULTIVITAMIN WITH MINERALS) TABS tablet Take 1 tablet by mouth every morning.    Yes Historical Provider, MD  vitamin B-12 (CYANOCOBALAMIN) 1000 MCG tablet Take 3,000 mcg by mouth daily.   Yes Historical Provider, MD  citalopram (CELEXA) 40 MG tablet Take 40 mg by mouth every morning.     Historical Provider, MD   BP 164/87 mmHg  Pulse 50  Temp(Src) 98.7 F (37.1 C) (Oral)  Resp 22  SpO2 99% Physical Exam   Constitutional: He is oriented to person, place, and time. He appears well-developed and well-nourished. No distress.  HENT:  Head: Normocephalic and atraumatic.  Right Ear: Hearing normal.  Left Ear: Hearing normal.  Nose: Nose normal.  Mouth/Throat: Oropharynx is clear and moist and mucous membranes are normal.  Eyes: Conjunctivae and EOM are normal. Pupils are equal, round, and reactive to light.  Neck: Normal range of motion. Neck supple.  Cardiovascular: Regular rhythm, S1 normal and S2 normal.  Exam reveals no gallop and no friction rub.   No murmur heard. Pulmonary/Chest: Effort normal and breath sounds normal. No respiratory distress. He exhibits no tenderness.  Abdominal: Soft. Normal appearance and bowel sounds are normal. There is no hepatosplenomegaly. There is no tenderness. There is no rebound, no guarding, no tenderness at McBurney's point and negative Murphy's sign. No hernia.  Musculoskeletal: Normal range of motion.  Neurological: He is alert and oriented to person, place, and time. He has normal strength. No cranial nerve deficit or sensory deficit. Coordination normal. GCS eye subscore is 4. GCS verbal subscore is 5. GCS motor subscore is 6.  Skin: Skin is warm, dry and intact. No rash noted. No cyanosis.  Psychiatric: His speech is normal and behavior is normal. Thought content normal. His mood appears anxious. He exhibits a depressed mood.  Nursing note and vitals reviewed.   ED Course  Procedures (including critical care time) Labs Review Labs Reviewed  CBC  URINE RAPID DRUG SCREEN (HOSP PERFORMED)  ACETAMINOPHEN LEVEL  COMPREHENSIVE METABOLIC PANEL  ETHANOL  SALICYLATE LEVEL  TROPONIN I    Imaging Review No results found.   EKG Interpretation None       Date: 05/08/2014  Rate: 50  Rhythm: sinus bradycardia  QRS Axis: normal  Intervals: normal  ST/T Wave abnormalities: normal  Conduction Disutrbances:none  Narrative Interpretation:   Old EKG  Reviewed: none available    MDM   Final diagnoses:  None   depression Panic attack  Presents to the ER for evaluation of severe anxiety and panic attacks. Symptoms ongoing for 4 or 5 days. Patient has been off of Xanax for more than a week. No signs of seizure. Patient is not homicidal or suicidal, but is depressed and concerned that he may become suicidal. He was complaining of shortness of breath or chest discomfort which seem consistent with the panic attack. EKG unremarkable. Cardiac workup performed. Patient will require behavioral health evaluation.    Orpah Greek, MD 05/08/14 [phone]

## 2014-05-08 NOTE — ED Notes (Signed)
Bed: TA56 Expected date: 05/08/14 Expected time: 1:38 PM Means of arrival: Ambulance Comments: HTN prescription withdrawl
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
## 2014-05-08 NOTE — BH Assessment (Signed)
[place] Assessment Progress Note  Spoke with Dr. Aline Brochure and took history of pt.  Rn will put tele assessment machine in the room and assessment will begin as soon as possible.

## 2014-05-08 NOTE — ED Notes (Signed)
Pt has one bag pt belongings containing clothing, shoes and necklace. Pt has cellphone locked up with security, key in chart.

## 2014-05-08 NOTE — ED Notes (Addendum)
Per EMS. Pt from home. Reports he has not taken xanax for 8 days. Pt denied SI/HI, but feels he will have those thoughts soon if symptoms continue. Told EMS he was having chest pain and sob, 12 lead unremarkable. Reports anxiety attacks for the past 4 days, feels he is having a "psychotic episode."

## 2014-05-09 DIAGNOSIS — F332 Major depressive disorder, recurrent severe without psychotic features: Secondary | ICD-10-CM

## 2014-05-09 DIAGNOSIS — F411 Generalized anxiety disorder: Secondary | ICD-10-CM | POA: Insufficient documentation

## 2014-05-09 DIAGNOSIS — F1994 Other psychoactive substance use, unspecified with psychoactive substance-induced mood disorder: Secondary | ICD-10-CM | POA: Insufficient documentation

## 2014-05-09 MED ORDER — ASPIRIN EC 81 MG PO TBEC
81.0000 mg | DELAYED_RELEASE_TABLET | Freq: Every morning | ORAL | Status: DC
Start: 2014-05-09 — End: 2014-08-29

## 2014-05-09 MED ORDER — HYDROXYZINE HCL 50 MG PO TABS: 50.0000 mg | ORAL_TABLET | Freq: Three times a day (TID) | ORAL | Status: DC | PRN

## 2014-05-09 MED ORDER — CITALOPRAM HYDROBROMIDE 40 MG PO TABS: 40.0000 mg | ORAL_TABLET | Freq: Every morning | ORAL | Status: DC

## 2014-05-09 MED ORDER — LISINOPRIL 20 MG PO TABS: 40.0000 mg | ORAL_TABLET | Freq: Every morning | ORAL | Status: DC

## 2014-05-09 MED ORDER — ATENOLOL 100 MG PO TABS: 100.0000 mg | ORAL_TABLET | Freq: Every day | ORAL | Status: DC

## 2014-05-09 MED ORDER — ALLOPURINOL 300 MG PO TABS: 300.0000 mg | ORAL_TABLET | Freq: Every morning | ORAL | Status: DC

## 2014-05-09 MED ORDER — ADULT MULTIVITAMIN W/MINERALS CH: 1.0000 | ORAL_TABLET | Freq: Every morning | ORAL | Status: DC

## 2014-05-09 NOTE — Progress Notes (Signed)
Pt reassessed and will be discharged with outpatient resources. Pt has appointment scheduled on 06/10/14 at 8:15 am with Dr. Kerrin Mo. Pt given paperwork for initial appointment and voiced appreciation of services provided.   Charlene Brooke, MSW  Social Worker [phone]

## 2014-05-09 NOTE — Progress Notes (Signed)
Patient ID: SAEL FURCHES, male   DOB: [birth year], 63 y.o.   MRN: [id number] Discharge Note-Seen by Heloise Purpura NP this am and he determined with Dr Dwyane Dee that he can be safely discharged home. He lives with his brother n law and he notified him to have him pick him up. Sent home with a RX of Vistaril of 21 pills. Reviewed with him his discharge plans which are with [facility] outpatient dept on 06/09/2014. He is agreeable with this plan. He is not suicidal or homicidal and states he is not experiencing any withdrawal sx from the benzodiazepines.He is pleasant and verbal and upbeat in his mood.All property returned to patent, changed his clothes and escorted to the lobby to wait on his ride.

## 2014-05-09 NOTE — Progress Notes (Signed)
Patient ID: Jonathan Wilson, male   DOB: 1951/04/23, 63 y.o.   MRN: [id number] D-rested much of am and on wakening is pleasant but offers little unless spoken to . He denies any thoughts to hurt self or others. He states he is able to be discharged home today. A-Monitored for safety. No medications ordered or given. Support offered. R-No complaints offered.

## 2014-05-09 NOTE — H&P (Signed)
[facility]BS UNIT H&P  Subjective: Pt seen and chart reviewed. Pt denies SI, HI, and AVH, contracts for safety. He spent the night in the OBS UNIT without incident. At this time, his primary concern is anxiety management. Will refer to outpatient psychiatry/counseling and give a 7 day script for Vistaril to assist with Anxiolytic withdrawals. Pt was on 4mg  daily of Xanax prior to admission, 50mg  Vistaril given for the severity of benzodiazepine dependence and rebound anxiety. Pt affirms understanding that he will only take this if needed and will report to ED if he becomes a danger to himself or others.   HPI:  Jonathan Wilson is an 63 y.o. male who came to [facility] c/o depression and benzo withdrawal.  He has had increased depression and anxiety since his wife died 1 year ago. He said that he has no OP providers and gets Xanax from his PCP.  His Rx is for 3x/day, but he sometimes takes 4x/day and he says he recently told his MD that he either "wants enough to help him feel better, or he wants to come off of it totally ".  Pt told his md this, and his MD stopped writing the Rx, and pt has not had any in 3 days.  Pt c/o hearing voices and visual hallucinations as he is coming off the medication, but he does not normally have these symptoms..  Pt denies HI, or SI, but says he has occasional thoughts of hurting others, "just stupid people in the community".  He denies getting in fights in about the past 10 years, but has had some recent conflict with friends/family.  He does have pistols in the home.  Pt was calm and cooperative during assessment and had normal movement, speech and thought pattern, with somewhat guarded and irritable affect. Pt did not appear to be responding to internal stimuli.  Pt says he has a part time job and lives with his brother in [place].  Stressors include grieving for his wife and some financial difficulties.  He says he has no family support.  He has a hx of alcohol abuse, but says he is not  having issues with that at this time and has been sober for a long time.  Spoke with Delphia Grates, NP, who recommends observation overnight and reassessment by psychiatry in the am.  Dr. Aline Brochure agrees with disposition and will initiate librium protocol.  Axis I: Generalized Anxiety Disorder and Major Depression, Recurrent severe Axis II: Deferred Axis III:  Past Medical History  Diagnosis Date  . Pneumonia     recurrent episodes   . Hypertension   . Hyperlipidemia   . Gout   . Vitamin D deficiency   . Hepatitis C before 1998-09-11  . Arthritis   . Obesity     300 # in 2008-09-10, BMI 34 in 04/2013.   . Right lower lobe lung mass 05/2008    resolved on follow up CT 09-11-2010.   [name] Psychosis September 11, 1998    s/e of treatment for Hepatitis C.    . Influenza A 06/2013    acute resp failure, did not require intubation.  . Diverticulosis of colon 09-10-2009    seen on CT scan in 10-Sep-2009.   [name] Anxiety and depression Sep 11, 1998    worse after death of wife 2013/03/12   Axis IV: problems with primary support group Axis V: 51-60 moderate symptoms  Psychiatric Specialty Exam: Physical Exam  ROS  Blood pressure 107/63, pulse 69, temperature 98.2 F (36.8 C), temperature  source Oral, resp. rate 18, height 6\' 3"  (1.905 m), weight 110.678 kg (244 lb), SpO2 100 %.Body mass index is 30.5 kg/(m^2).  General Appearance: Casual and Fairly Groomed  Engineer, water::  Good  Speech:  Clear and Coherent  Volume:  Normal  Mood:  Anxious  Affect:  Appropriate  Thought Process:  Coherent and Goal Directed  Orientation:  Full (Time, Place, and Person)  Thought Content:  WDL  Suicidal Thoughts:  No  Homicidal Thoughts:  No  Memory:  Immediate;   Fair Recent;   Fair Remote;   Fair  Judgement:  Good  Insight:  Good  Psychomotor Activity:  Normal  Concentration:  Good  Recall:  Good  Akathisia:  No  Handed:    AIMS (if indicated):     Assets:  Communication Skills Desire for Improvement Housing Leisure Time Physical  Health Resilience Social Support  Sleep:         Past Medical History:  Past Medical History  Diagnosis Date  . Pneumonia     recurrent episodes   . Hypertension   . Hyperlipidemia   . Gout   . Vitamin D deficiency   . Hepatitis C before September 05, 1998  . Arthritis   . Obesity     300 # in September 04, 2008, BMI 34 in 04/2013.   . Right lower lobe lung mass 05/2008    resolved on follow up CT 2010/09/05.   [name] Psychosis 09/05/1998    s/e of treatment for Hepatitis C.    . Influenza A 06/2013    acute resp failure, did not require intubation.  . Diverticulosis of colon 09/04/2009    seen on CT scan in September 04, 2009.   [name] Anxiety and depression 05-Sep-1998    worse after death of wife March 06, 2013    Past Surgical History  Procedure Laterality Date  . Mandible fracture surgery    . Cholecystectomy N/A 01/14/2014    Procedure: LAPAROSCOPIC CHOLECYSTECTOMY WITH INTRAOPERATIVE CHOLANGIOGRAM;  Surgeon: Shann Medal, MD;  Location: [facility];  Service: General;  Laterality: N/A;    Family History:  Family History  Problem Relation Age of Onset  . Diabetes Mother   . Arthritis Mother   . Cancer Father     pancreatic cancer    Social History:  reports that he has been smoking Cigarettes.  He has a 10 pack-year smoking history. He quit smokeless tobacco use about 11 months ago. He reports that he does not drink alcohol or use illicit drugs.  Additional Social History:     CIWA: CIWA-Ar BP: 107/63 mmHg Pulse Rate: 69 Nausea and Vomiting: no nausea and no vomiting Tactile Disturbances: none Tremor: two Auditory Disturbances: very mild harshness or ability to frighten Paroxysmal Sweats: barely perceptible sweating, palms moist Visual Disturbances: not present Anxiety: three Headache, Fullness in Head: none present Agitation: normal activity Orientation and Clouding of Sensorium: oriented and can do serial additions CIWA-Ar Total: 7 COWS:    PATIENT STRENGTHS: (choose at least two) Ability for insight Active sense of  humor Average or above average intelligence Capable of independent living Communication skills Motivation for treatment/growth Work skills  Allergies: No Known Allergies  Home Medications:  Medications Prior to Admission  Medication Sig Dispense Refill  . allopurinol (ZYLOPRIM) 300 MG tablet Take 300 mg by mouth every morning.     [name] ALPRAZolam (XANAX) 1 MG tablet Take 1 mg by mouth 3 (three) times daily.    [name] aspirin EC 81 MG tablet Take 81 mg by  mouth every morning.     [name] atenolol (TENORMIN) 100 MG tablet Take 100 mg by mouth at bedtime.     . Cholecalciferol (VITAMIN D-3) 5000 UNITS TABS Take 5-10 tablets by mouth every morning.    . citalopram (CELEXA) 40 MG tablet Take 40 mg by mouth every morning.     [name] lisinopril (PRINIVIL,ZESTRIL) 20 MG tablet Take 40 mg by mouth every morning.    . Multiple Vitamin (MULTIVITAMIN WITH MINERALS) TABS tablet Take 1 tablet by mouth every morning.     . vitamin B-12 (CYANOCOBALAMIN) 1000 MCG tablet Take 3,000 mcg by mouth daily.      OB/GYN Status:  No LMP for male patient.  Disposition:  -Discharge home with outpatient resources for psychiatry/counseling.  -Vistaril 50mg  tid PRN anxiety x 7 days #21 for withdrawal from 4mg  Xanax daily -[facility] TTS to assist with pt following up with [facility] for therapy.     Benjamine Mola, FNP-BC 05/09/2014 8:54 AM

## 2014-05-09 NOTE — Discharge Summary (Signed)
[facility] OBS UNIT DISCHARGE SUMMARY  Subjective: Pt seen and chart reviewed. Pt denies SI, HI, and AVH, contracts for safety. He spent the night in the OBS UNIT without incident. At this time, his primary concern is anxiety management. Will refer to outpatient psychiatry/counseling and give a 7 day script for Vistaril to assist with Anxiolytic withdrawals. Pt was on 4mg  daily of Xanax prior to admission, 50mg  Vistaril given for the severity of benzodiazepine dependence and rebound anxiety. Pt affirms understanding that he will only take this if needed and will report to ED if he becomes a danger to himself or others.   HPI:  Jonathan Wilson is an 63 y.o. male who came to [facility] c/o depression and benzo withdrawal.  He has had increased depression and anxiety since his wife died 1 year ago. He said that he has no OP providers and gets Xanax from his PCP.  His Rx is for 3x/day, but he sometimes takes 4x/day and he says he recently told his MD that he either "wants enough to help him feel better, or he wants to come off of it totally ".  Pt told his md this, and his MD stopped writing the Rx, and pt has not had any in 3 days.  Pt c/o hearing voices and visual hallucinations as he is coming off the medication, but he does not normally have these symptoms..  Pt denies HI, or SI, but says he has occasional thoughts of hurting others, "just stupid people in the community".  He denies getting in fights in about the past 10 years, but has had some recent conflict with friends/family.  He does have pistols in the home.  Pt was calm and cooperative during assessment and had normal movement, speech and thought pattern, with somewhat guarded and irritable affect. Pt did not appear to be responding to internal stimuli.  Pt says he has a part time job and lives with his brother in [place].  Stressors include grieving for his wife and some financial difficulties.  He says he has no family support.  He has a hx of alcohol abuse, but  says he is not having issues with that at this time and has been sober for a long time.  Spoke with Delphia Grates, NP, who recommends observation overnight and reassessment by psychiatry in the am.  Dr. Aline Brochure agrees with disposition and will initiate librium protocol.  Axis I: Generalized Anxiety Disorder and Major Depression, Recurrent severe Axis II: Deferred Axis III:  Past Medical History  Diagnosis Date  . Pneumonia     recurrent episodes   . Hypertension   . Hyperlipidemia   . Gout   . Vitamin D deficiency   . Hepatitis C before Sep 25, 1998  . Arthritis   . Obesity     300 # in 2008-09-24, BMI 34 in 04/2013.   . Right lower lobe lung mass 05/2008    resolved on follow up CT Sep 25, 2010.   [name] Psychosis September 25, 1998    s/e of treatment for Hepatitis C.    . Influenza A 06/2013    acute resp failure, did not require intubation.  . Diverticulosis of colon 2009-09-24    seen on CT scan in 2009-09-24.   [name] Anxiety and depression 25-Sep-1998    worse after death of wife March 26, 2013   Axis IV: problems with primary support group Axis V: 51-60 moderate symptoms  Psychiatric Specialty Exam: Physical Exam  ROS  Blood pressure 107/63, pulse 69, temperature 98.2 F (36.8 C),  temperature source Oral, resp. rate 18, height 6\' 3"  (1.905 m), weight 110.678 kg (244 lb), SpO2 100 %.Body mass index is 30.5 kg/(m^2).  General Appearance: Casual and Fairly Groomed  Engineer, water::  Good  Speech:  Clear and Coherent  Volume:  Normal  Mood:  Anxious  Affect:  Appropriate  Thought Process:  Coherent and Goal Directed  Orientation:  Full (Time, Place, and Person)  Thought Content:  WDL  Suicidal Thoughts:  No  Homicidal Thoughts:  No  Memory:  Immediate;   Fair Recent;   Fair Remote;   Fair  Judgement:  Good  Insight:  Good  Psychomotor Activity:  Normal  Concentration:  Good  Recall:  Good  Akathisia:  No  Handed:    AIMS (if indicated):     Assets:  Communication Skills Desire for Improvement Housing Leisure Time Physical  Health Resilience Social Support  Sleep:         Past Medical History:  Past Medical History  Diagnosis Date  . Pneumonia     recurrent episodes   . Hypertension   . Hyperlipidemia   . Gout   . Vitamin D deficiency   . Hepatitis C before 23-Sep-1998  . Arthritis   . Obesity     300 # in 09-22-08, BMI 34 in 04/2013.   . Right lower lobe lung mass 05/2008    resolved on follow up CT 09/23/10.   [name] Psychosis September 23, 1998    s/e of treatment for Hepatitis C.    . Influenza A 06/2013    acute resp failure, did not require intubation.  . Diverticulosis of colon 2009/09/22    seen on CT scan in 09-22-09.   [name] Anxiety and depression 09-23-1998    worse after death of wife 03-24-2013    Past Surgical History  Procedure Laterality Date  . Mandible fracture surgery    . Cholecystectomy N/A 01/14/2014    Procedure: LAPAROSCOPIC CHOLECYSTECTOMY WITH INTRAOPERATIVE CHOLANGIOGRAM;  Surgeon: Shann Medal, MD;  Location: [facility];  Service: General;  Laterality: N/A;    Family History:  Family History  Problem Relation Age of Onset  . Diabetes Mother   . Arthritis Mother   . Cancer Father     pancreatic cancer    Social History:  reports that he has been smoking Cigarettes.  He has a 10 pack-year smoking history. He quit smokeless tobacco use about 11 months ago. He reports that he does not drink alcohol or use illicit drugs.  Additional Social History:     CIWA: CIWA-Ar BP: 107/63 mmHg Pulse Rate: 69 Nausea and Vomiting: no nausea and no vomiting Tactile Disturbances: none Tremor: two Auditory Disturbances: very mild harshness or ability to frighten Paroxysmal Sweats: barely perceptible sweating, palms moist Visual Disturbances: not present Anxiety: three Headache, Fullness in Head: none present Agitation: normal activity Orientation and Clouding of Sensorium: oriented and can do serial additions CIWA-Ar Total: 7 COWS:    PATIENT STRENGTHS: (choose at least two) Ability for insight Active sense of  humor Average or above average intelligence Capable of independent living Communication skills Motivation for treatment/growth Work skills  Allergies: No Known Allergies  Home Medications:  Medications Prior to Admission  Medication Sig Dispense Refill  . allopurinol (ZYLOPRIM) 300 MG tablet Take 300 mg by mouth every morning.     [name] ALPRAZolam (XANAX) 1 MG tablet Take 1 mg by mouth 3 (three) times daily.    [name] aspirin EC 81 MG tablet Take 81 mg  by mouth every morning.     [name] atenolol (TENORMIN) 100 MG tablet Take 100 mg by mouth at bedtime.     . Cholecalciferol (VITAMIN D-3) 5000 UNITS TABS Take 5-10 tablets by mouth every morning.    . citalopram (CELEXA) 40 MG tablet Take 40 mg by mouth every morning.     [name] lisinopril (PRINIVIL,ZESTRIL) 20 MG tablet Take 40 mg by mouth every morning.    . Multiple Vitamin (MULTIVITAMIN WITH MINERALS) TABS tablet Take 1 tablet by mouth every morning.     . vitamin B-12 (CYANOCOBALAMIN) 1000 MCG tablet Take 3,000 mcg by mouth daily.      OB/GYN Status:  No LMP for male patient.  Disposition:  -Discharge home with outpatient resources for psychiatry/counseling.  -Vistaril 50mg  tid PRN anxiety x 7 days #21 for withdrawal from 4mg  Xanax daily -[facility] TTS to assist with pt following up with [facility] for therapy.     Benjamine Mola, FNP-BC 05/09/2014 12:42 PM

## 2014-05-09 NOTE — Progress Notes (Signed)
Admission Note:  Patient is a 63 year old male who presents [facility] in no acute distress for the treatment of depression and benzo withdrawal symptoms. Pt appears flat and depressed, calm and cooperative with admission process. Pt denies SI/HI/AVH at this time. Pt endorses chronic generalized body pain which he called "psychotic pain" patient also reported that his PCP quitted him 8 days ago because he didn't want to continue prescribihg him Xanax.  A:support and encouragement given to patients. Skin was assessed, no wounds, cuts or tattoo seen. Unit policies explained and understanding verbalized. Consents obtained. Food and fluids offered, and fluids accepted.  R: Pt had no additional questions or concerns.

## 2014-06-10 ENCOUNTER — Telehealth (HOSPITAL_COMMUNITY): Payer: Self-pay | Admitting: *Deleted

## 2014-06-10 ENCOUNTER — Ambulatory Visit (INDEPENDENT_AMBULATORY_CARE_PROVIDER_SITE_OTHER): Payer: Federal, State, Local not specified - Other | Admitting: Psychiatry

## 2014-06-10 ENCOUNTER — Encounter (INDEPENDENT_AMBULATORY_CARE_PROVIDER_SITE_OTHER): Payer: Self-pay

## 2014-06-10 VITALS — BP 174/92 | HR 52 | Wt 243.0 lb

## 2014-06-10 DIAGNOSIS — F329 Major depressive disorder, single episode, unspecified: Secondary | ICD-10-CM

## 2014-06-10 DIAGNOSIS — F322 Major depressive disorder, single episode, severe without psychotic features: Secondary | ICD-10-CM

## 2014-06-10 MED ORDER — TRAZODONE HCL 100 MG PO TABS: ORAL_TABLET | ORAL | Status: DC

## 2014-06-10 MED ORDER — MIRTAZAPINE 30 MG PO TABS: 30.0000 mg | ORAL_TABLET | Freq: Every day | ORAL | Status: DC

## 2014-06-10 MED ORDER — DIVALPROEX SODIUM ER 250 MG PO TB24: ORAL_TABLET | ORAL | Status: DC

## 2014-06-10 NOTE — Progress Notes (Signed)
Psychiatric Assessment Adult  Patient Identification:  Jonathan Wilson Date of Evaluation:  06/10/2014 Chief Complaint: My mind is racing This patient is a 64 year old widow of one year a father who presently is essentially unemployed. His history begins about 13 years ago when he is drinking regular basis at least a 12 pack a day. Just years before that he used IV heroin and cocaine. The patient then met his wife who insisted that he cease from all substances in fact he claims he did. It was about 12 years ago when he was diagnosed at [facility] with hepatitis C and begun on interferon. On this agent the patient did experience some anxiety and the [place] who are treating him for his hepatitis C began him on Valium. He was successfully cured from his hepatitis C continue taking Valium but his primary care doctor for reasons that are not clear changed his Valium to Xanax 1 mg 3 times a day. He successfully took 1 mg 3 times a day of Xanax for nearly a decade without problems. 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 Unfortunately for reasons that may be very clear his wife died one year ago and was at that time where he claimed the 1 mg of Xanax 3 a day was not working anymore. I suspect this was a reaction to the death of his wife. I suspect he had depression and anxiety and at that time his leg began to decline.. Fortunately the patient did not go back to drugs and alcohol. What happened over the next year is he started asking for more and more Xanax at some point was taking as much as 6 mg a day. Also some point his primary care doctor apparently stop prescribing this agent and Mr. Orrego went to a withdrawal. His withdrawal occurred approximately one month ago when at that time off Xanax he became very agitated very anxious and claims she started hearing voices. He was seen at [facility] in her observational area. The patient was treated with 25 mg of  Vistaril 3 times a day for about one week but for the last 3 weeks has been completely off of psychotropics. Today the patient feels very anxious and describes racing thinking in his mind. He is very fearful of dying. I do not believe this is a manic-like racing thoughts rather think he is extremely anxious and tense. The patient is been unable to sleep for the last 4 weeks. He claims he gets only one or 2 hours at a time and sleeps overall much less. His losses appetite has no energy and is lost his ability to enjoy things. He is to enjoy being on the computer and his dog no longer. The patient describes feeling worthless and describes psychomotor slowing. This patient distinctly denies suicidal ideation. He's never made a suicide attempt. The patient apparently has gone through withdrawal before on Xanax and when that has occurred heard voices and saw visions. He has never heard voices or seeing visions are been psychotic in any way was not related to alcohol drug withdrawal. The patient denies ever having a distinct episode of major depression nor of mania. He denies symptoms of a generalized anxiety disorder panic disorder or obsessive-compulsive disorder. The patient is never been a patient in a psychiatric hospital and up until today had never been thoroughly evaluated by a psychiatrist. He was seen in our observation area and given Vistaril but other  than that has had little psychiatric care. His medical history significant for hepatitis C which she claims is cured at this time, hypertension and recently had his gallbladder removed. The patient has a high school degree, smokes 1 pack a day of cigarettes and has never lost consciousness or had a seizure. Medically he has no specific complaints. Presently the patient lives with his brother-in-law. This was his wife's brother. His brother-in-law is financially supportive for him. Essentially this time the most predominant symptoms [name] experiencing is  irritability, impulsivity, racing thinking and anxiety. He feels suspicious and guarded about the world around him. I do think at this time he meets the criteria of major depression. It is noted that is been off of all benzodiazepines approximately one month. History of Chief Complaint:  No chief complaint on file.   HPI Review of Systems Physical Exam  Depressive Symptoms: depressed mood,  (Hypo) Manic Symptoms:   Elevated Mood:  No Irritable Mood: yes Grandiosity:   Distractibility:   Labiality of Mood:  No Delusions:  No Hallucinations:  No Impulsivity:  No Sexually Inappropriate Behavior:  No Financial Extravagance:  No Flight of Ideas:  No  Anxiety Symptoms: Excessive Worry:  Yes Panic Symptoms:  No Agoraphobia:  No Obsessive Compulsive: No  Symptoms:  Specific Phobias:   Social Anxiety:    Psychotic Symptoms:  Hallucinations: No None Delusions:  No Paranoia:  No   Ideas of Reference:  No  PTSD Symptoms: Ever had a traumatic exposure:  No Had a traumatic exposure in the last month:  No Re-experiencing: No None Hypervigilance:   Hyperarousal:   Avoidance:    Traumatic Brain Injury: No   Past Psychiatric History: Diagnosis: Benzodiazepine withdrawal   Hospitalizations:   Outpatient Care:   Substance Abuse Care:   Self-Mutilation:   Suicidal Attempts:   Violent Behaviors:    Past Medical History:   Past Medical History  Diagnosis Date  . Pneumonia     recurrent episodes   . Hypertension   . Hyperlipidemia   . Gout   . Vitamin D deficiency   . Hepatitis C before Jul 18, 1998  . Arthritis   . Obesity     300 # in 07-18-08, BMI 34 in 04/2013.   . Right lower lobe lung mass 05/2008    resolved on follow up CT 07-18-10.   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    . Influenza A 06/2013    acute resp failure, did not require intubation.  . Diverticulosis of colon 07/18/09    seen on CT scan in 2009/07/18.   [name] Anxiety and depression Jul 18, 1998    worse after  death of wife 2013/03/17   History of Loss of Consciousness:   Seizure History:   Cardiac History:   Allergies:  No Known Allergies Current Medications:     Medical Consequences of Substance Abuse:   Legal Consequences of Substance Abuse:   Family Consequences of Substance Abuse:   Blackouts:   DT's:   Withdrawal Symptoms:     Social History: Current Place of Residence: [place] Place of Birth:  Family Members:  Marital Status:  Widowed Children: 2  Sons:   Daughters:  Relationships:  Education:  [employer] Problems/Performance:  Religious Beliefs/Practices:  History of Abuse:  Ship broker History:   Legal History:  Hobbies/Interests:   Family History:   Family History  Problem Relation Age of Onset  . Diabetes Mother   . Arthritis  Mother   . Cancer Father     pancreatic cancer    Mental Status Examination/Evaluation: Objective:  Appearance: Disheveled  Eye Contact::  Good  Speech:  Normal Rate  Volume:  Normal  Mood:  Anxious   Affect:  Congruent  Thought Process:  Coherent  Orientation:  Full (Time, Place, and Person)  Thought Content:  WDL  Suicidal Thoughts:  No  Homicidal Thoughts:  No  Judgement:  NA  Insight:  Good  Psychomotor Activity:  Normal  Akathisia:  No  Handed:  Right  AIMS (if indicated):    Assets:      Laboratory/X-Ray Psychological Evaluation(s)        Assessment:  Axis I: Major Depression, single episode  AXIS I Major Depression, single episode  AXIS II   AXIS III Past Medical History  Diagnosis Date  . Pneumonia     recurrent episodes   . Hypertension   . Hyperlipidemia   . Gout   . Vitamin D deficiency   . Hepatitis C before July 29, 1998  . Arthritis   . Obesity     300 # in 2008-07-29, BMI 34 in 04/2013.   . Right lower lobe lung mass 05/2008    resolved on follow up CT July 29, 2010.   [name] Psychosis 29-Jul-1998    s/e of treatment for Hepatitis C.    . Influenza A 07-29-13    acute resp failure, did not  require intubation.  . Diverticulosis of colon 29-Jul-2009    seen on CT scan in 07/29/09.   [name] Anxiety and depression Jul 29, 1998    worse after death of wife Apr 28, 2013     AXIS IV housing problems and occupational problems  AXIS V 51-60 moderate symptoms   Treatment Plan/Recommendations:  Plan of Care: At this time this patient she'll begin on Remeron 30 mg at night. This is for what I consider to be an episode of major depression. This characterized by anhedonia with problems with sleep appetite and energy. Fortunately this patient is not suicidal. The patient also begin on Depakote and have his dose titrated up to 750 mg for impulsivity and irritation. No fluid hopefully also will help with racing thinking. I do not believe this patient has a manic disorder. I think this patient's major problems or isn't effective disorder and the effect of being on Xanax for over a decade. He's only been off the age in approximately 3 or 4 weeks and I think it still having ramifications. Today the patient also was given a prescription for trazodone 100 mg 1 or 2 each night. Today the patient was also set up to see a therapist. This patient to return to see me in 7 weeks this patient is not suicidal. He is not dangerous to himself or others.   Laboratory:    Psychotherapy:   Medications:   Routine PRN Medications:    Consultations:   Safety Concerns:    Other:      Haskel Schroeder, MD 1/8/20169:26 AM

## 2014-06-10 NOTE — Telephone Encounter (Signed)
Pt called stating he went to get his medication filled but it was going to be $166 and he can't afford it. Called his pharmacy to verify the medication that was $166 and they state it's Divalprovex (Depakote). Pt asking what he should do since he can't get this medication.

## 2014-06-13 ENCOUNTER — Other Ambulatory Visit (HOSPITAL_COMMUNITY): Payer: Self-pay

## 2014-06-13 ENCOUNTER — Telehealth: Payer: Self-pay

## 2014-06-13 DIAGNOSIS — F4322 Adjustment disorder with anxiety: Secondary | ICD-10-CM

## 2014-06-13 DIAGNOSIS — F411 Generalized anxiety disorder: Secondary | ICD-10-CM

## 2014-06-13 MED ORDER — CARBAMAZEPINE 200 MG PO TABS: 200.0000 mg | ORAL_TABLET | Freq: Two times a day (BID) | ORAL | Status: DC

## 2014-06-13 NOTE — Telephone Encounter (Signed)
Patient called stating he could not fill his Depakote due to cost and requested to discuss with provider about an alternative.  Please advise.  Cost was $166.00 even for generic.

## 2014-06-13 NOTE — Telephone Encounter (Signed)
D. Telephone call with Mr. Hauss to inform Dr. Casimiro Needle was changing him over to Tegretol 200mg  twice a day, #60 with 5 refills in place of the Depakote he had tried to start but was so expensive. A. Reviewed medication with Mr. Fabro and discussed any concerns. R. Mr. Slovacek to call back if any problems with medication or if still having problems with anxiety or mood after a week or earlier if needed.

## 2014-06-22 MED ORDER — CARBAMAZEPINE 200 MG PO TABS: ORAL_TABLET | ORAL | Status: DC

## 2014-06-22 NOTE — Addendum Note (Signed)
Addended by: Norma Fredrickson on: 06/22/2014 04:52 PM   Modules accepted: Orders, Medications

## 2014-06-22 NOTE — Telephone Encounter (Signed)
At this time I would asked the patient not to fill the Depakote because of expense and instead will begin her on him on Tegretol 200 mg twice a day

## 2014-06-27 ENCOUNTER — Encounter (HOSPITAL_COMMUNITY): Payer: Self-pay | Admitting: Clinical

## 2014-06-27 ENCOUNTER — Ambulatory Visit (INDEPENDENT_AMBULATORY_CARE_PROVIDER_SITE_OTHER): Payer: Federal, State, Local not specified - Other | Admitting: Clinical

## 2014-06-27 DIAGNOSIS — F13939 Sedative, hypnotic or anxiolytic use, unspecified with withdrawal, unspecified: Secondary | ICD-10-CM

## 2014-06-27 DIAGNOSIS — F331 Major depressive disorder, recurrent, moderate: Secondary | ICD-10-CM

## 2014-06-27 DIAGNOSIS — F4321 Adjustment disorder with depressed mood: Secondary | ICD-10-CM

## 2014-06-27 DIAGNOSIS — F13239 Sedative, hypnotic or anxiolytic dependence with withdrawal, unspecified: Secondary | ICD-10-CM
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
## 2014-07-06 ENCOUNTER — Ambulatory Visit (INDEPENDENT_AMBULATORY_CARE_PROVIDER_SITE_OTHER): Payer: Federal, State, Local not specified - Other | Admitting: Clinical

## 2014-07-06 ENCOUNTER — Encounter (HOSPITAL_COMMUNITY): Payer: Self-pay | Admitting: Clinical

## 2014-07-06 DIAGNOSIS — F331 Major depressive disorder, recurrent, moderate: Secondary | ICD-10-CM

## 2014-07-06 NOTE — Progress Notes (Deleted)
   THERAPIST PROGRESS NOTE  Session Time: 12:30  Participation Level: Active  Behavioral Response: {Appearance:22683}{BHH LEVEL OF CONSCIOUSNESS:22305}{BHH MOOD:22306}  Type of Therapy: Individual Therapy  Treatment Goals addressed: {CHL AMB BH Treatment Goals Addressed:21022754}  Interventions: {CHL AMB BH Type of Intervention:21022753}  Summary: Jonathan Wilson is a 64 y.o. male who presents with ***.   Suicidal/Homicidal: {BHH YES OR NO:22294}{yes/no/with/without intent/plan:22693}  Therapist Response: ***  Plan: Return again in *** weeks.  Diagnosis: Axis I: {psych axis 1:31909}    Axis II: {psych axis 2:31910}    Antonin Meininger A, LCSW 07/06/2014

## 2014-07-07 NOTE — Progress Notes (Signed)
   THERAPIST PROGRESS NOTE  Session Time: 12:30 -1:26  Participation Level: Active  Behavioral Response: CasualAlertDepressed  Type of Therapy: Individual Therapy  Treatment Goals addressed: calming skills, emotional regulations skills  Interventions: Motivational Interviewing and Other: grounding and mindfulness  Summary: Jonathan Wilson is a 64 y.o. male who presents with Major Depressive Disorder.   Suicidal/Homicidal: Nowithout intent/plan  Therapist Response: Jonathan Wilson met with clinician for an individual session. Jonathan Wilson discussed his psychiatric symptoms,his current life events, and his homework. Clinician had given him one grounding technique to practice which he reported he did daily. When asked about what he noticed he reported that he was more able to do some of his regular tasks. He shared that he was able to make the appointments he had. He discussed his his medication. He shared that he was no longer hallucinating as badly and that the grounding techniques helped  him to focus away from any hallucinations that remain. He reported that he still was very depressed, though not suicidal. Jonathan Wilson and clinician discussed his grounding technique practice and clinician introduced him to additional techniques. Client and clinician discussed the purpose of the techniques, and application of the techniques. Client and clinician practiced some of the techniques together. Jonathan Wilson agreed to practice the techniques until next session. Client and clinician started a conversation about how our thoughts affect our emotions and our actions.     Plan: Return again in 1 week.  Diagnosis: Axis I:  Major Depressive Disorder       Jonathan Wilson A, LCSW 07/07/2014

## 2014-07-14 ENCOUNTER — Ambulatory Visit (HOSPITAL_COMMUNITY): Payer: Federal, State, Local not specified - Other | Admitting: Clinical

## 2014-07-29 ENCOUNTER — Ambulatory Visit (HOSPITAL_COMMUNITY): Payer: Federal, State, Local not specified - Other | Admitting: Psychiatry

## 2014-08-29 ENCOUNTER — Emergency Department (HOSPITAL_COMMUNITY): Payer: Self-pay

## 2014-08-29 ENCOUNTER — Emergency Department (HOSPITAL_COMMUNITY)
Admission: EM | Admit: 2014-08-29 | Discharge: 2014-08-30 | Disposition: A | Discharge status: Home / Self Care | Payer: Self-pay | Attending: Emergency Medicine | Admitting: Emergency Medicine

## 2014-08-29 ENCOUNTER — Encounter (HOSPITAL_COMMUNITY): Payer: Self-pay | Admitting: Emergency Medicine

## 2014-08-29 DIAGNOSIS — Z79899 Other long term (current) drug therapy: Secondary | ICD-10-CM | POA: Insufficient documentation

## 2014-08-29 DIAGNOSIS — M1990 Unspecified osteoarthritis, unspecified site: Secondary | ICD-10-CM | POA: Insufficient documentation

## 2014-08-29 DIAGNOSIS — Z720 Tobacco use: Secondary | ICD-10-CM | POA: Insufficient documentation

## 2014-08-29 DIAGNOSIS — F329 Major depressive disorder, single episode, unspecified: Secondary | ICD-10-CM | POA: Insufficient documentation

## 2014-08-29 DIAGNOSIS — Z8719 Personal history of other diseases of the digestive system: Secondary | ICD-10-CM | POA: Insufficient documentation

## 2014-08-29 DIAGNOSIS — R0602 Shortness of breath: Secondary | ICD-10-CM | POA: Insufficient documentation

## 2014-08-29 DIAGNOSIS — J680 Bronchitis and pneumonitis due to chemicals, gases, fumes and vapors: Secondary | ICD-10-CM | POA: Insufficient documentation

## 2014-08-29 DIAGNOSIS — Z8619 Personal history of other infectious and parasitic diseases: Secondary | ICD-10-CM | POA: Insufficient documentation

## 2014-08-29 DIAGNOSIS — E559 Vitamin D deficiency, unspecified: Secondary | ICD-10-CM | POA: Insufficient documentation

## 2014-08-29 DIAGNOSIS — E669 Obesity, unspecified: Secondary | ICD-10-CM | POA: Insufficient documentation

## 2014-08-29 DIAGNOSIS — T59891A Toxic effect of other specified gases, fumes and vapors, accidental (unintentional), initial encounter: Secondary | ICD-10-CM | POA: Insufficient documentation

## 2014-08-29 DIAGNOSIS — I10 Essential (primary) hypertension: Secondary | ICD-10-CM | POA: Insufficient documentation

## 2014-08-29 DIAGNOSIS — M109 Gout, unspecified: Secondary | ICD-10-CM | POA: Insufficient documentation

## 2014-08-29 DIAGNOSIS — F419 Anxiety disorder, unspecified: Secondary | ICD-10-CM | POA: Insufficient documentation

## 2014-08-29 MED ORDER — ATENOLOL 100 MG PO TABS: 100.0000 mg | ORAL_TABLET | Freq: Every day | ORAL | Status: DC

## 2014-08-29 MED ORDER — ATENOLOL 100 MG PO TABS
100.0000 mg | ORAL_TABLET | Freq: Once | ORAL | Status: AC
  Administered 2014-08-30: 100 mg via ORAL
  Filled 2014-08-29: qty 1

## 2014-08-29 MED ORDER — IPRATROPIUM-ALBUTEROL 0.5-2.5 (3) MG/3ML IN SOLN
3.0000 mL | Freq: Once | RESPIRATORY_TRACT | Status: AC
  Administered 2014-08-29: 3 mL via RESPIRATORY_TRACT
  Filled 2014-08-29: qty 3

## 2014-08-29 MED ORDER — ALBUTEROL SULFATE HFA 108 (90 BASE) MCG/ACT IN AERS: 2.0000 | INHALATION_SPRAY | RESPIRATORY_TRACT | Status: DC | PRN

## 2014-08-29 MED ORDER — ALLOPURINOL 300 MG PO TABS: 300.0000 mg | ORAL_TABLET | Freq: Every morning | ORAL | Status: DC

## 2014-08-29 MED ORDER — HYDRALAZINE HCL 20 MG/ML IJ SOLN: 5.0000 mg | Freq: Once | INTRAMUSCULAR | Status: DC

## 2014-08-29 MED ORDER — PREDNISONE 20 MG PO TABS
60.0000 mg | ORAL_TABLET | Freq: Once | ORAL | Status: AC
  Administered 2014-08-29: 60 mg via ORAL
  Filled 2014-08-29: qty 3

## 2014-08-29 MED ORDER — LISINOPRIL 20 MG PO TABS: 40.0000 mg | ORAL_TABLET | Freq: Every morning | ORAL | Status: DC

## 2014-08-29 MED ORDER — PREDNISONE 20 MG PO TABS: ORAL_TABLET | ORAL | Status: DC

## 2014-08-29 MED ORDER — ASPIRIN EC 81 MG PO TBEC: 81.0000 mg | DELAYED_RELEASE_TABLET | Freq: Every morning | ORAL | Status: DC

## 2014-08-29 MED ORDER — LISINOPRIL 20 MG PO TABS
20.0000 mg | ORAL_TABLET | Freq: Once | ORAL | Status: AC
  Administered 2014-08-30: 20 mg via ORAL
  Filled 2014-08-29: qty 1

## 2014-08-29 NOTE — Discharge Instructions (Signed)
Pneumonitis Pneumonitis is inflammation of the lungs.  CAUSES  Many things can cause pneumonitis. These can include:   A bacterial or viral infection. Pneumonitis due to an infection is usually called pneumonia.  Work-related exposures, including farm and industrial work. Some substances that can cause pneumonitis include asbestos, silica, inhaled acids, or inhaled chlorine gas.   Repeated exposure to bird feathers, bird feces, or other allergens.   Medicine such as chemotherapy drugs, certain antibiotics, and some heart medicines.   Radiation therapy.   Exposure to mold. A hot tub, sauna, or home humidifier can have mold growing in it, even if it looks clean. The mold can be breathed in through water vapor.  Breathing (aspirating) stomach contents, food, or liquids into the lungs.  SIGNS AND SYMPTOMS   Cough.   Shortness of breath or difficulty breathing.   Fever.   Decreased energy.   Decreased appetite.  DIAGNOSIS  To diagnose pneumonitis, your health care provider will do a complete history and physical exam. Various tests may be ordered, such as:   Pulmonary function test.   Chest X-ray.   CT scan of the lungs.   Bronchoscopy.   Lung biopsy.  TREATMENT  Treatment will depend on the cause of the pneumonitis. If the cause is exposure to a substance, avoiding further exposure to that substance will help reduce your symptoms. Possible medical treatments for pneumonitis include:   Corticosteroid medicine to help decrease inflammation in the lungs.   Antibiotic medicine to help fight a bacterial lung infection.   Oxygen therapy if you are having difficulty breathing.  HOME CARE INSTRUCTIONS   Avoid exposure to any substance identified as the cause of your pneumonitis.   If you must continue to work with substances that can cause pneumonitis, wear a mask to protect your lungs.   Only take over-the-counter or prescription medicine as directed by  your health care provider.   Do not smoke.   If you use inhalers, keep them with you at all times.   Follow up with your health care provider as directed.  SEEK IMMEDIATE MEDICAL CARE IF:   You develop new or increased shortness of breath.   You develop a blue color (cyanosis) under your fingernails.   You have a fever.  MAKE SURE YOU:   Understand these instructions.  Will watch your condition.  Will get help right away if you are not doing well or get worse. Document Released: 11/07/2009 Document Revised: 01/20/2013 Document Reviewed: 11/09/2012 [facility] Patient Information 2015 [place], [place]. This information is not intended to replace advice given to you by your health care provider. Make sure you discuss any questions you have with your health care provider.

## 2014-08-29 NOTE — ED Provider Notes (Addendum)
CSN: [id number]     Arrival date & time 08/29/14  09-16-14 History   First MD Initiated Contact with Patient 08/29/14 [id number]     Chief Complaint  Patient presents with  . Shortness of Breath    after using cleaning products with sulfuric acid at @1400      (Consider location/radiation/quality/duration/timing/severity/associated sxs/prior Treatment) Patient is a 64 y.o. male presenting with shortness of breath. The history is provided by the patient. No language interpreter was used.  Shortness of Breath Severity:  Moderate Onset quality:  Sudden Duration:  6 hours Timing:  Constant Progression:  Unchanged Chronicity:  New Context comment:  Exposed to a sulfuric acid drain cleaner Relieved by:  Nothing Worsened by:  Nothing tried Ineffective treatments: Stepping outside for fresh air. Associated symptoms: abdominal pain (present for 1 week and right upper quadrant without radiation, without associated symptoms), cough and wheezing   Associated symptoms: no chest pain, no fever, no headaches, no rash, no sputum production and no vomiting     Past Medical History  Diagnosis Date  . Pneumonia     recurrent episodes   . Hypertension   . Hyperlipidemia   . Gout   . Vitamin D deficiency   . Hepatitis C before Sep 16, 1998  . Arthritis   . Obesity     300 # in 09-15-08, BMI 34 in 04/2013.   . Right lower lobe lung mass 05/2008    resolved on follow up CT 09-16-2010.   [name] Psychosis 16-Sep-1998    s/e of treatment for Hepatitis C.    . Influenza A 06/2013    acute resp failure, did not require intubation.  . Diverticulosis of colon September 15, 2009    seen on CT scan in 15-Sep-2009.   [name] Anxiety and depression Sep 16, 1998    worse after death of wife 04-17-13  . Anxiety   . Depression    Past Surgical History  Procedure Laterality Date  . Mandible fracture surgery    . Cholecystectomy N/A 01/14/2014    Procedure: LAPAROSCOPIC CHOLECYSTECTOMY WITH INTRAOPERATIVE CHOLANGIOGRAM;  Surgeon: Shann Medal, MD;  Location: [facility];   Service: General;  Laterality: N/A;   Family History  Problem Relation Age of Onset  . Diabetes Mother   . Arthritis Mother   . Cancer Father     pancreatic cancer   History  Substance Use Topics  . Smoking status: Current Every Day Smoker -- 1.00 packs/day for 40 years    Types: Cigarettes  . Smokeless tobacco: Former Systems developer    Quit date: 06/06/2013     Comment: as of 01/2014 smoking 1/2 PPD  . Alcohol Use: No     Comment: drinks occasional beer.  "2 dozen beers" over first 8 months of Sep 15, 2013.     Review of Systems  Constitutional: Negative for fever, activity change, appetite change and fatigue.  HENT: Negative for congestion, facial swelling, rhinorrhea and trouble swallowing.   Eyes: Negative for photophobia and pain.  Respiratory: Positive for cough, shortness of breath and wheezing. Negative for sputum production and chest tightness.   Cardiovascular: Negative for chest pain and leg swelling.  Gastrointestinal: Positive for abdominal pain (present for 1 week and right upper quadrant without radiation, without associated symptoms). Negative for nausea, vomiting, diarrhea and constipation.  Endocrine: Negative for polydipsia and polyuria.  Genitourinary: Negative for dysuria, urgency, decreased urine volume and difficulty urinating.  Musculoskeletal: Negative for back pain and gait problem.  Skin: Negative for color change, rash and wound.  Allergic/Immunologic:  Negative for immunocompromised state.  Neurological: Negative for dizziness, facial asymmetry, speech difficulty, weakness, numbness and headaches.  Psychiatric/Behavioral: Negative for confusion, decreased concentration and agitation.      Allergies  Review of patient's allergies indicates no known allergies.  Home Medications   Prior to Admission medications   Medication Sig Start Date End Date Taking? Authorizing Provider  carbamazepine (TEGRETOL) 200 MG tablet Take 1 tablet (200 mg total) by mouth 2 (two) times  daily. 06/13/14  Yes Norma Fredrickson, MD  Cholecalciferol (VITAMIN D3) 5000 UNITS TABS Take 1 tablet by mouth daily.   Yes Historical Provider, MD  Multiple Vitamin (MULTIVITAMIN WITH MINERALS) TABS tablet Take 1 tablet by mouth every morning. 05/09/14  Yes Benjamine Mola, FNP  Thiamine HCl (VITAMIN B-1 PO) Take 1 tablet by mouth daily.   Yes Historical Provider, MD  albuterol (PROVENTIL HFA;VENTOLIN HFA) 108 (90 BASE) MCG/ACT inhaler Inhale 2-4 puffs into the lungs every 4 (four) hours as needed for wheezing or shortness of breath. 08/29/14   Ernestina Patches, MD  allopurinol (ZYLOPRIM) 300 MG tablet Take 1 tablet (300 mg total) by mouth every morning. 08/29/14   Ernestina Patches, MD  aspirin EC 81 MG tablet Take 1 tablet (81 mg total) by mouth every morning. 08/29/14   Ernestina Patches, MD  atenolol (TENORMIN) 100 MG tablet Take 1 tablet (100 mg total) by mouth at bedtime. 08/29/14   Ernestina Patches, MD  carbamazepine (TEGRETOL) 200 MG tablet 1  bid Patient not taking: Reported on 06/27/2014 06/22/14   Norma Fredrickson, MD  citalopram (CELEXA) 40 MG tablet Take 1 tablet (40 mg total) by mouth every morning. Patient not taking: Reported on 06/27/2014 05/09/14   Benjamine Mola, FNP  hydrOXYzine (ATARAX/VISTARIL) 50 MG tablet Take 1 tablet (50 mg total) by mouth 3 (three) times daily as needed for anxiety. Patient not taking: Reported on 07/06/2014 05/09/14   Benjamine Mola, FNP  lisinopril (PRINIVIL,ZESTRIL) 20 MG tablet Take 2 tablets (40 mg total) by mouth every morning. 08/29/14   Ernestina Patches, MD  mirtazapine (REMERON) 30 MG tablet Take 1 tablet (30 mg total) by mouth at bedtime. Patient not taking: Reported on 07/06/2014 06/10/14   Norma Fredrickson, MD  predniSONE (DELTASONE) 20 MG tablet 3 tabs po day one, then 2 po daily x 4 days 08/29/14   Ernestina Patches, MD  traZODone (DESYREL) 100 MG tablet 1 qhs  aftyer 2 nights mayincrease to 2  qhs Patient not taking: Reported on 07/06/2014 06/10/14   Norma Fredrickson, MD   BP 177/92  mmHg  Pulse 64  Temp(Src) 98.1 F (36.7 C) (Oral)  Resp 24  Ht 6\' 2"  (1.88 m)  Wt 240 lb (108.863 kg)  BMI 30.80 kg/m2  SpO2 98% Physical Exam  Constitutional: He is oriented to person, place, and time. He appears well-developed and well-nourished. No distress.  HENT:  Head: Normocephalic and atraumatic.  Mouth/Throat: No oropharyngeal exudate.  Eyes: Pupils are equal, round, and reactive to light.  Neck: Normal range of motion. Neck supple.  Cardiovascular: Normal rate, regular rhythm and normal heart sounds.  Exam reveals no gallop and no friction rub.   No murmur heard. Pulmonary/Chest: Effort normal. No respiratory distress. He has decreased breath sounds. He has wheezes. He has no rales.  Abdominal: Soft. Bowel sounds are normal. He exhibits no distension and no mass. There is no tenderness. There is no rebound and no guarding.  Musculoskeletal: Normal range of motion. He exhibits no edema or tenderness.  Neurological: He is alert and oriented to person, place, and time.  Skin: Skin is warm and dry.  Psychiatric: He has a normal mood and affect.    ED Course  Procedures (including critical care time) Labs Review Labs Reviewed  CBC WITH DIFFERENTIAL/PLATELET - Abnormal; Notable for the following:    Platelets 149 (*)    All other components within normal limits  COMPREHENSIVE METABOLIC PANEL - Abnormal; Notable for the following:    AST 52 (*)    ALT 75 (*)    GFR calc non Af Amer 86 (*)    All other components within normal limits  LIPASE, BLOOD    Imaging Review Dg Chest 2 View  08/30/2014   CLINICAL DATA:  Chest pain, shortness of breath, elevated blood pressure. Inhalation of cleaning products with sulfuric acid at 14:00.  EXAM: CHEST  2 VIEW  COMPARISON:  06/21/13  FINDINGS: The lungs are hyperinflated, mildly progressed from prior. The cardiomediastinal contours are normal. Pulmonary vasculature is normal. No consolidation, pleural effusion, or pneumothorax. No  acute osseous abnormalities are seen.  IMPRESSION: Mild hyperinflation, no localizing process.   Electronically Signed   By: Jeb Levering M.D.   On: 08/30/2014 00:03     EKG Interpretation   Date/Time:  Monday August 29 2014 20:30:04 EDT Ventricular Rate:  88 PR Interval:  152 QRS Duration: 103 QT Interval:  355 QTC Calculation: 429 R Axis:   -58 Text Interpretation:  Sinus rhythm Incomplete RBBB and LAFB Abnormal  R-wave progression, early transition Minimal ST elevation, anterolateral  leads Artifact throughout limit interpretation Confirmed by Nathanal Hermiz  MD,  [place] ([id number]) on 08/29/2014 10:32:14 PM      MDM   Final diagnoses:  Acute pneumonitis due to chemical fumes    Pt is a 64 y.o. male with Pmhx as above who presents with cough and shortness of breath since being exposed to a drain cleaner containing sulfuric acid for about 30 minutes for proximally 2 to 2:30 PM.  He states he went outside afterwards and try to get fresh air, but has not improved. On PE, Pt hypertensive (has not had his antihypertensives for at least a week).  He has decreased air movement is scattered wheezing throughout, with frequent bronchospastic coughing.  O2 sats are normal.  Chest x-ray with mild hyperinflation, but no localizing process.  I spoke with poison control who recommends supportive care, steroids.  His report, there can be some delayed symptoms.  I'll recommend patient return should current symptoms worsen.    Patient feeling slightly improved after 1 DuoNeb.  Has increased wheezing on auscultation.  Will repeat DuoNeb, steroids and home antihypertensive have been given. Will also get screening CBC, BMP given significant HTN. No acute ischemic changes on EKG, no chest pain.   Pt has had some improvement with duonebs, Sats nml even with ambulation. He has mild AST/ALT elevation on labs, nml tbili (surgical absence of GB). Cr nml. Will d/c home with albuterol, steroids, home BP meds.    [name] evaluation in the Emergency Department is complete. It has been determined that no acute conditions requiring further emergency intervention are present at this time. The patient/guardian have been advised of the diagnosis and plan. We have discussed signs and symptoms that warrant return to the ED, such as changes or worsening in symptoms, worsening dyspnea, fever.     Ernestina Patches, MD 08/30/14 [id number]  Ernestina Patches, MD 08/31/14 [phone]

## 2014-08-29 NOTE — ED Notes (Addendum)
Patient c/o SOB onset @ 1400. Patient states he was cleaning a drain with sulfuric acid when symptoms started. Patient states he can still smell and taste it. Patient is ambulatory and able to speak in complete sentences. Patient is hypertensive, has been out of his HTN meds x3 weeks. Patient states he does not have a PCP to write him an Rx.

## 2014-08-30 LAB — CBC WITH DIFFERENTIAL/PLATELET
BASOS ABS: 0 10*3/uL (ref 0.0–0.1)
Basophils Relative: 0 % (ref 0–1)
EOS PCT: 3 % (ref 0–5)
Eosinophils Absolute: 0.2 10*3/uL (ref 0.0–0.7)
HCT: 45.6 % (ref 39.0–52.0)
Hemoglobin: 15.9 g/dL (ref 13.0–17.0)
LYMPHS PCT: 25 % (ref 12–46)
Lymphs Abs: 1.5 10*3/uL (ref 0.7–4.0)
MCH: 33.1 pg (ref 26.0–34.0)
MCHC: 34.9 g/dL (ref 30.0–36.0)
MCV: 95 fL (ref 78.0–100.0)
MONO ABS: 0.6 10*3/uL (ref 0.1–1.0)
Monocytes Relative: 10 % (ref 3–12)
Neutro Abs: 3.6 10*3/uL (ref 1.7–7.7)
Neutrophils Relative %: 62 % (ref 43–77)
Platelets: 149 10*3/uL — ABNORMAL LOW (ref 150–400)
RBC: 4.8 MIL/uL (ref 4.22–5.81)
RDW: 12.3 % (ref 11.5–15.5)
WBC: 5.8 10*3/uL (ref 4.0–10.5)

## 2014-08-30 LAB — COMPREHENSIVE METABOLIC PANEL
ALK PHOS: 83 U/L (ref 39–117)
ALT: 75 U/L — ABNORMAL HIGH (ref 0–53)
ANION GAP: 9 (ref 5–15)
AST: 52 U/L — ABNORMAL HIGH (ref 0–37)
Albumin: 4.5 g/dL (ref 3.5–5.2)
BILIRUBIN TOTAL: 0.9 mg/dL (ref 0.3–1.2)
BUN: 16 mg/dL (ref 6–23)
CO2: 28 mmol/L (ref 19–32)
Calcium: 9.2 mg/dL (ref 8.4–10.5)
Chloride: 104 mmol/L (ref 96–112)
Creatinine, Ser: 0.98 mg/dL (ref 0.50–1.35)
GFR calc non Af Amer: 86 mL/min — ABNORMAL LOW (ref >90)
Glucose, Bld: 95 mg/dL (ref 70–99)
Potassium: 4.2 mmol/L (ref 3.5–5.1)
SODIUM: 141 mmol/L (ref 135–145)
TOTAL PROTEIN: 7.3 g/dL (ref 6.0–8.3)

## 2014-08-30 LAB — LIPASE, BLOOD: LIPASE: 19 U/L (ref 11–59)

## 2014-08-30 MED ORDER — ALBUTEROL SULFATE HFA 108 (90 BASE) MCG/ACT IN AERS
4.0000 | INHALATION_SPRAY | Freq: Once | RESPIRATORY_TRACT | Status: AC
  Administered 2014-08-30: 4 via RESPIRATORY_TRACT
  Filled 2014-08-30: qty 6.7

## 2014-08-30 MED ORDER — HYDROCODONE-ACETAMINOPHEN 5-325 MG PO TABS: 1.0000 | ORAL_TABLET | Freq: Four times a day (QID) | ORAL | Status: DC | PRN

## 2014-08-30 MED ORDER — IPRATROPIUM-ALBUTEROL 0.5-2.5 (3) MG/3ML IN SOLN
3.0000 mL | Freq: Once | RESPIRATORY_TRACT | Status: AC
  Administered 2014-08-30: 3 mL via RESPIRATORY_TRACT
  Filled 2014-08-30: qty 3

## 2014-08-30 MED ORDER — HYDROCODONE-ACETAMINOPHEN 5-325 MG PO TABS
2.0000 | ORAL_TABLET | Freq: Once | ORAL | Status: AC
  Administered 2014-08-30: 2 via ORAL
  Filled 2014-08-30: qty 2

## 2014-12-21 ENCOUNTER — Encounter: Payer: Self-pay | Admitting: Internal Medicine

## 2015-01-02 ENCOUNTER — Emergency Department (HOSPITAL_COMMUNITY)
Admission: EM | Admit: 2015-01-02 | Discharge: 2015-01-02 | Disposition: A | Discharge status: Home / Self Care | Payer: Self-pay | Attending: Emergency Medicine | Admitting: Emergency Medicine

## 2015-01-02 ENCOUNTER — Encounter (HOSPITAL_COMMUNITY): Payer: Self-pay | Admitting: Emergency Medicine

## 2015-01-02 DIAGNOSIS — R1012 Left upper quadrant pain: Secondary | ICD-10-CM | POA: Insufficient documentation

## 2015-01-02 DIAGNOSIS — I160 Hypertensive urgency: Secondary | ICD-10-CM

## 2015-01-02 DIAGNOSIS — Z720 Tobacco use: Secondary | ICD-10-CM | POA: Insufficient documentation

## 2015-01-02 DIAGNOSIS — E669 Obesity, unspecified: Secondary | ICD-10-CM | POA: Insufficient documentation

## 2015-01-02 DIAGNOSIS — Z7982 Long term (current) use of aspirin: Secondary | ICD-10-CM | POA: Insufficient documentation

## 2015-01-02 DIAGNOSIS — R110 Nausea: Secondary | ICD-10-CM | POA: Insufficient documentation

## 2015-01-02 DIAGNOSIS — Z9049 Acquired absence of other specified parts of digestive tract: Secondary | ICD-10-CM | POA: Insufficient documentation

## 2015-01-02 DIAGNOSIS — Z8709 Personal history of other diseases of the respiratory system: Secondary | ICD-10-CM | POA: Insufficient documentation

## 2015-01-02 DIAGNOSIS — F419 Anxiety disorder, unspecified: Secondary | ICD-10-CM | POA: Insufficient documentation

## 2015-01-02 DIAGNOSIS — Z8719 Personal history of other diseases of the digestive system: Secondary | ICD-10-CM | POA: Insufficient documentation

## 2015-01-02 DIAGNOSIS — Z79899 Other long term (current) drug therapy: Secondary | ICD-10-CM | POA: Insufficient documentation

## 2015-01-02 DIAGNOSIS — Z8701 Personal history of pneumonia (recurrent): Secondary | ICD-10-CM | POA: Insufficient documentation

## 2015-01-02 DIAGNOSIS — M1990 Unspecified osteoarthritis, unspecified site: Secondary | ICD-10-CM | POA: Insufficient documentation

## 2015-01-02 DIAGNOSIS — Z8619 Personal history of other infectious and parasitic diseases: Secondary | ICD-10-CM | POA: Insufficient documentation

## 2015-01-02 DIAGNOSIS — F329 Major depressive disorder, single episode, unspecified: Secondary | ICD-10-CM | POA: Insufficient documentation

## 2015-01-02 DIAGNOSIS — E559 Vitamin D deficiency, unspecified: Secondary | ICD-10-CM | POA: Insufficient documentation

## 2015-01-02 DIAGNOSIS — R1011 Right upper quadrant pain: Secondary | ICD-10-CM | POA: Insufficient documentation

## 2015-01-02 DIAGNOSIS — I10 Essential (primary) hypertension: Secondary | ICD-10-CM | POA: Insufficient documentation

## 2015-01-02 DIAGNOSIS — R1013 Epigastric pain: Secondary | ICD-10-CM

## 2015-01-02 LAB — URINALYSIS, ROUTINE W REFLEX MICROSCOPIC
Glucose, UA: NEGATIVE mg/dL
Hgb urine dipstick: NEGATIVE
Ketones, ur: NEGATIVE mg/dL
LEUKOCYTES UA: NEGATIVE
Nitrite: NEGATIVE
PROTEIN: NEGATIVE mg/dL
SPECIFIC GRAVITY, URINE: 1.026 (ref 1.005–1.030)
Urobilinogen, UA: 1 mg/dL (ref 0.0–1.0)
pH: 5.5 (ref 5.0–8.0)

## 2015-01-02 LAB — COMPREHENSIVE METABOLIC PANEL
ALK PHOS: 78 U/L (ref 38–126)
ALT: 62 U/L (ref 17–63)
AST: 56 U/L — ABNORMAL HIGH (ref 15–41)
Albumin: 4.2 g/dL (ref 3.5–5.0)
Anion gap: 8 (ref 5–15)
BUN: 14 mg/dL (ref 6–20)
CALCIUM: 9.8 mg/dL (ref 8.9–10.3)
CO2: 29 mmol/L (ref 22–32)
Chloride: 105 mmol/L (ref 101–111)
Creatinine, Ser: 0.98 mg/dL (ref 0.61–1.24)
GFR calc Af Amer: >60 mL/min (ref >60)
GFR calc non Af Amer: >60 mL/min (ref >60)
GLUCOSE: 108 mg/dL — AB (ref 65–99)
POTASSIUM: 5.2 mmol/L — AB (ref 3.5–5.1)
SODIUM: 142 mmol/L (ref 135–145)
Total Bilirubin: 1.1 mg/dL (ref 0.3–1.2)
Total Protein: 7.3 g/dL (ref 6.5–8.1)

## 2015-01-02 LAB — CBC WITH DIFFERENTIAL/PLATELET
BASOS PCT: 0 % (ref 0–1)
Basophils Absolute: 0 10*3/uL (ref 0.0–0.1)
EOS ABS: 0.2 10*3/uL (ref 0.0–0.7)
EOS PCT: 4 % (ref 0–5)
HCT: 45.7 % (ref 39.0–52.0)
Hemoglobin: 16.4 g/dL (ref 13.0–17.0)
LYMPHS ABS: 1.1 10*3/uL (ref 0.7–4.0)
LYMPHS PCT: 23 % (ref 12–46)
MCH: 33.1 pg (ref 26.0–34.0)
MCHC: 35.9 g/dL (ref 30.0–36.0)
MCV: 92.3 fL (ref 78.0–100.0)
MONO ABS: 0.7 10*3/uL (ref 0.1–1.0)
MONOS PCT: 13 % — AB (ref 3–12)
Neutro Abs: 2.9 10*3/uL (ref 1.7–7.7)
Neutrophils Relative %: 60 % (ref 43–77)
Platelets: 140 10*3/uL — ABNORMAL LOW (ref 150–400)
RBC: 4.95 MIL/uL (ref 4.22–5.81)
RDW: 12.4 % (ref 11.5–15.5)
WBC: 4.9 10*3/uL (ref 4.0–10.5)

## 2015-01-02 LAB — LIPASE, BLOOD: Lipase: 20 U/L — ABNORMAL LOW (ref 22–51)

## 2015-01-02 MED ORDER — KETOROLAC TROMETHAMINE 30 MG/ML IJ SOLN
30.0000 mg | Freq: Once | INTRAMUSCULAR | Status: AC
  Administered 2015-01-02: 30 mg via INTRAMUSCULAR
  Filled 2015-01-02: qty 1

## 2015-01-02 MED ORDER — LISINOPRIL 20 MG PO TABS
20.0000 mg | ORAL_TABLET | Freq: Once | ORAL | Status: AC
  Administered 2015-01-02: 20 mg via ORAL
  Filled 2015-01-02: qty 1

## 2015-01-02 MED ORDER — LISINOPRIL 20 MG PO TABS: 20.0000 mg | ORAL_TABLET | Freq: Every day | ORAL | Status: DC

## 2015-01-02 MED ORDER — ATENOLOL 25 MG PO TABS: 100.0000 mg | ORAL_TABLET | Freq: Every day | ORAL | Status: DC

## 2015-01-02 MED ORDER — SODIUM CHLORIDE 0.9 % IV BOLUS (SEPSIS)
1000.0000 mL | Freq: Once | INTRAVENOUS | Status: AC
Start: 2015-01-02 — End: 2015-01-02
  Administered 2015-01-02: 1000 mL via INTRAVENOUS

## 2015-01-02 MED ORDER — GI COCKTAIL ~~LOC~~
30.0000 mL | Freq: Once | ORAL | Status: AC
  Administered 2015-01-02: 30 mL via ORAL
  Filled 2015-01-02: qty 30

## 2015-01-02 MED ORDER — MORPHINE SULFATE 4 MG/ML IJ SOLN
4.0000 mg | Freq: Once | INTRAMUSCULAR | Status: AC
  Administered 2015-01-02: 4 mg via INTRAVENOUS
  Filled 2015-01-02: qty 1

## 2015-01-02 MED ORDER — ONDANSETRON HCL 4 MG/2ML IJ SOLN
4.0000 mg | Freq: Once | INTRAMUSCULAR | Status: AC
  Administered 2015-01-02: 4 mg via INTRAVENOUS
  Filled 2015-01-02: qty 2

## 2015-01-02 NOTE — ED Notes (Signed)
Pt reported having headache, occ blurred vision, dizziness/lightheadedness has been on-going for 7 months. Pt reported RUQ pain, nausea with meals but no emesis and denies diarrhea. Abd soft nondistended but tender to palpate. BS (+) and active x4 quadrants. Denies urinary problems. LBM today.

## 2015-01-02 NOTE — ED Notes (Signed)
Awake. Verbally responsive. Resp even and unlabored. No audible adventitious breath sounds noted. ABC's intact. Abd soft/nondistended but tender to palpate. BS (+) and active x4 quadrants. No N/V/D reported since arrival.

## 2015-01-02 NOTE — ED Notes (Signed)
Awake. Verbally responsive. A/O x4. Resp even and unlabored. No audible adventitious breath sounds noted. ABC's intact.  

## 2015-01-02 NOTE — ED Provider Notes (Signed)
CSN: [id number]     Arrival date & time 01/02/15  1245 History   First MD Initiated Contact with Patient 01/02/15 1340     Chief Complaint  Patient presents with  . Headache  . Abdominal Pain     (Consider location/radiation/quality/duration/timing/severity/associated sxs/prior Treatment) Patient is a 65 y.o. male presenting with headaches and abdominal pain. The history is provided by the patient. No language interpreter was used.  Headache Associated symptoms: abdominal pain and nausea   Associated symptoms: no diarrhea, no fever and no vomiting   Abdominal Pain Associated symptoms: nausea   Associated symptoms: no chills, no diarrhea, no dysuria, no fever, no shortness of breath and no vomiting   Jonathan Wilson is a 64 y.o male with a history of HTN, hyperlipidemia, benzodiazepine withdrawal, and hepatitis C who presents for worsening abdominal pain, headache, and nausea for the past few months.  He denies any treatment prior to arrival.  Patient had to be redirected multiple times during conversation. He has multiple complaints including anxiety that he was diagnosed with in December but states that his medications are not working. Nothing makes his symptoms better or worse. Denies any fever, chills, vision changes, photophobia, phonophobia, chest pain, shortness of breath, cough, vomiting, constipation, diarrhea , hematuria.   Past Medical History  Diagnosis Date  . Pneumonia     recurrent episodes   . Hypertension   . Hyperlipidemia   . Gout   . Vitamin D deficiency   . Hepatitis C before 29-Sep-1998  . Arthritis   . Obesity     300 # in September 28, 2008, BMI 34 in 04/2013.   . Right lower lobe lung mass 05/2008    resolved on follow up CT 29-Sep-2010.   [name] Psychosis September 29, 1998    s/e of treatment for Hepatitis C.    . Influenza A 06/2013    acute resp failure, did not require intubation.  . Diverticulosis of colon 2009-09-28    seen on CT scan in 09-28-09.   [name] Anxiety and depression September 29, 1998    worse after death of wife  30-Apr-2013  . Anxiety   . Depression    Past Surgical History  Procedure Laterality Date  . Mandible fracture surgery    . Cholecystectomy N/A 01/14/2014    Procedure: LAPAROSCOPIC CHOLECYSTECTOMY WITH INTRAOPERATIVE CHOLANGIOGRAM;  Surgeon: Shann Medal, MD;  Location: [facility];  Service: General;  Laterality: N/A;   Family History  Problem Relation Age of Onset  . Diabetes Mother   . Arthritis Mother   . Cancer Father     pancreatic cancer   History  Substance Use Topics  . Smoking status: Current Every Day Smoker -- 1.00 packs/day for 40 years    Types: Cigarettes  . Smokeless tobacco: Former Systems developer    Quit date: 06/06/2013     Comment: as of 01/2014 smoking 1/2 PPD  . Alcohol Use: No     Comment: drinks occasional beer.  "2 dozen beers" over first 8 months of 2013-09-28.     Review of Systems  Constitutional: Negative for fever and chills.  Respiratory: Negative for shortness of breath.   Gastrointestinal: Positive for nausea and abdominal pain. Negative for vomiting and diarrhea.  Genitourinary: Negative for dysuria and difficulty urinating.  Neurological: Positive for headaches.  All other systems reviewed and are negative.     Allergies  Review of patient's allergies indicates no known allergies.  Home Medications   Prior to Admission medications   Medication Sig Start Date  End Date Taking? Authorizing Provider  albuterol (PROVENTIL HFA;VENTOLIN HFA) 108 (90 BASE) MCG/ACT inhaler Inhale 2-4 puffs into the lungs every 4 (four) hours as needed for wheezing or shortness of breath. 08/29/14  Yes Ernestina Patches, MD  allopurinol (ZYLOPRIM) 300 MG tablet Take 1 tablet (300 mg total) by mouth every morning. 08/29/14  Yes Ernestina Patches, MD  aspirin EC 81 MG tablet Take 1 tablet (81 mg total) by mouth every morning. 08/29/14  Yes Ernestina Patches, MD  carbamazepine (TEGRETOL) 200 MG tablet Take 1 tablet (200 mg total) by mouth 2 (two) times daily. 06/13/14  Yes Norma Fredrickson, MD    cholecalciferol (VITAMIN D) 1000 UNITS tablet Take 5,000 Units by mouth daily.   Yes Historical Provider, MD  Multiple Vitamin (MULTIVITAMIN WITH MINERALS) TABS tablet Take 1 tablet by mouth every morning. 05/09/14  Yes Benjamine Mola, FNP  Thiamine HCl (VITAMIN B-1 PO) Take 1 tablet by mouth daily.   Yes Historical Provider, MD  atenolol (TENORMIN) 25 MG tablet Take 4 tablets (100 mg total) by mouth daily. 01/02/15   Valjean Ruppel Patel-Mills, PA-C  carbamazepine (TEGRETOL) 200 MG tablet 1  bid Patient not taking: Reported on 06/27/2014 06/22/14   Norma Fredrickson, MD  Cholecalciferol (VITAMIN D3) 5000 UNITS TABS Take 1 tablet by mouth daily.    Historical Provider, MD  citalopram (CELEXA) 40 MG tablet Take 1 tablet (40 mg total) by mouth every morning. Patient not taking: Reported on 06/27/2014 05/09/14   Benjamine Mola, FNP  HYDROcodone-acetaminophen (NORCO) 5-325 MG per tablet Take 1 tablet by mouth every 6 (six) hours as needed. Patient not taking: Reported on 01/02/2015 08/30/14   Ernestina Patches, MD  hydrOXYzine (ATARAX/VISTARIL) 50 MG tablet Take 1 tablet (50 mg total) by mouth 3 (three) times daily as needed for anxiety. Patient not taking: Reported on 07/06/2014 05/09/14   Benjamine Mola, FNP  lisinopril (PRINIVIL,ZESTRIL) 20 MG tablet Take 1 tablet (20 mg total) by mouth daily. 01/02/15   Solangel Mcmanaway Patel-Mills, PA-C  mirtazapine (REMERON) 30 MG tablet Take 1 tablet (30 mg total) by mouth at bedtime. Patient not taking: Reported on 07/06/2014 06/10/14   Norma Fredrickson, MD  predniSONE (DELTASONE) 20 MG tablet 3 tabs po day one, then 2 po daily x 4 days Patient not taking: Reported on 01/02/2015 08/29/14   Ernestina Patches, MD  traZODone (DESYREL) 100 MG tablet 1 qhs  aftyer 2 nights mayincrease to 2  qhs Patient not taking: Reported on 07/06/2014 06/10/14   Norma Fredrickson, MD   BP 188/103 mmHg  Pulse 69  Temp(Src) 98.2 F (36.8 C) (Oral)  Resp 20  Ht 6\' 3"  (1.905 m)  Wt 255 lb (115.667 kg)  BMI 31.87 kg/m2  SpO2  96% Physical Exam  Constitutional: He is oriented to person, place, and time. He appears well-developed and well-nourished.  HENT:  Head: Normocephalic and atraumatic.  Eyes: Conjunctivae are normal.  Neck: Normal range of motion. Neck supple.  Cardiovascular: Normal rate, regular rhythm and normal heart sounds.   Pulmonary/Chest: Effort normal and breath sounds normal. No respiratory distress. He has no wheezes. He has no rales. He exhibits no tenderness.  Abdominal: Soft. He exhibits no distension. There is tenderness in the right upper quadrant, epigastric area and left upper quadrant. There is no rigidity, no rebound and no guarding.     Obese.  Musculoskeletal: Normal range of motion.  Neurological: He is alert and oriented to person, place, and time.  Skin: Skin is warm and dry.  Psychiatric: He has a normal mood and affect. His behavior is normal.  Nursing note and vitals reviewed.   ED Course  Procedures (including critical care time) Labs Review Labs Reviewed  CBC WITH DIFFERENTIAL/PLATELET - Abnormal; Notable for the following:    Platelets 140 (*)    Monocytes Relative 13 (*)    All other components within normal limits  COMPREHENSIVE METABOLIC PANEL - Abnormal; Notable for the following:    Potassium 5.2 (*)    Glucose, Bld 108 (*)    AST 56 (*)    All other components within normal limits  LIPASE, BLOOD - Abnormal; Notable for the following:    Lipase 20 (*)    All other components within normal limits  URINALYSIS, ROUTINE W REFLEX MICROSCOPIC (NOT AT [facility]) - Abnormal; Notable for the following:    Color, Urine AMBER (*)    Bilirubin Urine SMALL (*)    All other components within normal limits    Imaging Review No results found.   EKG Interpretation   Date/Time:  Monday January 02 2015 14:43:08 EDT Ventricular Rate:  66 PR Interval:  121 QRS Duration: 115 QT Interval:  423 QTC Calculation: 443 R Axis:   -2 Text Interpretation:  Sinus rhythm  Incomplete RBBB and LAFB since last  tracing no significant change Confirmed by BELFI  MD, MELANIE ([id number]) on  01/02/2015 3:43:27 PM      MDM   Final diagnoses:  Epigastric pain  Hypertensive urgency   Patient presents for chronic abdominal pain and headache that is been greater than 3 months. He states that he does not have a primary care physician since December and has been dealing with anxiety also. He has mild hyperkalemia which I discussed with him but otherwise his labs are unremarkable.  He denies taking potassium at home. He had a cholecystectomy one year ago. After giving him morphine and a GI cocktail he is still complaining of pain. He exhibits drug-seeking behavior. He has asked me for Xanax as well as a prescription to go home with. He states he needs these because he has not been able to sleep or leave the house due to anxiety attacks since December 2015. I discussed that I could not refill his Xanax prescription and that he would need to see a primary care physician. He proceeded to tell me that he could not afford a primary care physician and has not taken his blood pressure medications. I spoke to case management who will see the patient and help him with his medications and to find a primary care physician. Case management was able to get him follow-up at [facility]. I also discussed that I did not have a reason to give him any more narcotic pain medications and I have given him Toradol instead. He has no peritoneal signs and has not vomited since he has been in the ED. Medications  sodium chloride 0.9 % bolus 1,000 mL (0 mLs Intravenous Stopped 01/02/15 1502)  ondansetron (ZOFRAN) injection 4 mg (4 mg Intravenous Given 01/02/15 1407)  morphine 4 MG/ML injection 4 mg (4 mg Intravenous Given 01/02/15 1407)  gi cocktail (Maalox,Lidocaine,Donnatal) (30 mLs Oral Given 01/02/15 1520)  lisinopril (PRINIVIL,ZESTRIL) tablet 20 mg (20 mg Oral Given 01/02/15 1550)  ketorolac (TORADOL) 30 MG/ML injection  30 mg (30 mg Intramuscular Given 01/02/15 1551)  I refilled his lisinopril and atenolol until he is able to see his new Primary care physician. Upon discharge patient requested to see me and asked for narcotic  pain medication for his chronic headaches. He has no signs of temporal arteritis, SAH, meningismus, or glaucoma. I discussed that he would need to follow-up with neurology or his PCP and that he could take ibuprofen for headaches.   Ottie Glazier, PA-C 01/02/15 [place], MD 01/03/15 [phone]

## 2015-01-02 NOTE — Care Management Note (Addendum)
Case Management Note  Patient Details  Name: Jonathan Wilson MRN: [id number] Date of Birth: 16-Jan-1951  Subjective/Objective:    64 yr old Jonathan Wilson self pay pt without pcp after per pt "dismissed from Dr Melford Aase office for abusing xanax"  Pt states he tried [facility] but they informed him "no longer accepting new patients"  Pt reports after his [facility] inpatient stay and seeing Dr Garth Bigness he has not seen a psychiatrist since Reports seeing a counselor only for a few months "to play mind games with me." States he told her Dr Garth Bigness changed med to carb but it is not working and she told me to try it for a few months" Reports "not being able to go out my own house"                 Action/Plan:  CM spoke with pt who confirms uninsured [employer] resident with no pcp.  CM discussed and provided written information for uninsured accepting pcps, discussed the importance of pcp vs EDP services for f/u care, www.needymeds.org, www.goodrx.com, discounted pharmacies and other [employer] such as [employer] [facility] [employer], affordable care act, financial assistance, uninsured dental services, [place] med assist, DSS and  health department  Reviewed resources for [employer] uninsured accepting pcps like [facility], family medicine at [employer], [facility], [facility], local urgent care centers, [facility], [facility] family practice, general medical clinics, family services of the [place], [facility] urgent care plus others, medication resources, [facility] out patient pharmacies and housing Pt voiced understanding and appreciation of resources provided   Provided [facility] contact information Pt agreed to a referral Cm completed referral Pt to be contact by [facility] clinical liason  Expected Discharge Date:   January 02 2015                Expected Discharge Plan:   home with resources and referral to [facility]  In-House Referral:   na   Discharge planning Services   na    Status of Service:    complete    If discussed at Long Length of Stay Meetings, dates discussed:    Additional Comments:  [name], RN 01/02/2015, 3:55 PM

## 2015-01-02 NOTE — ED Notes (Signed)
Awake. Verbally responsive. A/O x4. Resp even and unlabored. No audible adventitious breath sounds noted. ABC's intact. SR on monitor. IV saline lock patent and intact. IV flds infused.

## 2015-01-02 NOTE — ED Notes (Addendum)
pt c/o multiple things. 1. Headache that has been going on for several months 2. abd pain without n/v/d over the past several months as well.  Pt states that he was told he has a sopt on his liver and since he lost his insurance the physician wouldn't see him. Pt states "i would rather be dead than to feel like this".   Pt states that he has been dealing with anxiety issues since December or so.

## 2015-01-02 NOTE — ED Notes (Signed)
Attempted IV venipuncture x1 without success.

## 2015-04-29 ENCOUNTER — Encounter (HOSPITAL_COMMUNITY): Payer: Self-pay

## 2015-04-29 ENCOUNTER — Inpatient Hospital Stay (HOSPITAL_COMMUNITY)
Admission: EM | Admit: 2015-04-29 | Discharge: 2015-05-01 | DRG: 305 | Disposition: A | Discharge status: Home / Self Care | Payer: BLUE CROSS/BLUE SHIELD | Attending: Internal Medicine | Admitting: Internal Medicine

## 2015-04-29 DIAGNOSIS — E559 Vitamin D deficiency, unspecified: Secondary | ICD-10-CM | POA: Diagnosis present

## 2015-04-29 DIAGNOSIS — Z8619 Personal history of other infectious and parasitic diseases: Secondary | ICD-10-CM

## 2015-04-29 DIAGNOSIS — M109 Gout, unspecified: Secondary | ICD-10-CM | POA: Diagnosis present

## 2015-04-29 DIAGNOSIS — Z85528 Personal history of other malignant neoplasm of kidney: Secondary | ICD-10-CM | POA: Diagnosis not present

## 2015-04-29 DIAGNOSIS — R079 Chest pain, unspecified: Secondary | ICD-10-CM | POA: Diagnosis present

## 2015-04-29 DIAGNOSIS — E669 Obesity, unspecified: Secondary | ICD-10-CM | POA: Diagnosis present

## 2015-04-29 DIAGNOSIS — J42 Unspecified chronic bronchitis: Secondary | ICD-10-CM | POA: Diagnosis not present

## 2015-04-29 DIAGNOSIS — Z6830 Body mass index (BMI) 30.0-30.9, adult: Secondary | ICD-10-CM | POA: Diagnosis not present

## 2015-04-29 DIAGNOSIS — Z87891 Personal history of nicotine dependence: Secondary | ICD-10-CM

## 2015-04-29 DIAGNOSIS — R9431 Abnormal electrocardiogram [ECG] [EKG]: Secondary | ICD-10-CM | POA: Diagnosis not present

## 2015-04-29 DIAGNOSIS — Z833 Family history of diabetes mellitus: Secondary | ICD-10-CM | POA: Diagnosis not present

## 2015-04-29 DIAGNOSIS — R1011 Right upper quadrant pain: Secondary | ICD-10-CM

## 2015-04-29 DIAGNOSIS — I10 Essential (primary) hypertension: Secondary | ICD-10-CM | POA: Diagnosis present

## 2015-04-29 DIAGNOSIS — Z800 Family history of malignant neoplasm of digestive organs: Secondary | ICD-10-CM

## 2015-04-29 DIAGNOSIS — J449 Chronic obstructive pulmonary disease, unspecified: Secondary | ICD-10-CM | POA: Diagnosis present

## 2015-04-29 DIAGNOSIS — R51 Headache: Secondary | ICD-10-CM | POA: Diagnosis present

## 2015-04-29 DIAGNOSIS — R6884 Jaw pain: Secondary | ICD-10-CM | POA: Diagnosis present

## 2015-04-29 DIAGNOSIS — K069 Disorder of gingiva and edentulous alveolar ridge, unspecified: Secondary | ICD-10-CM | POA: Diagnosis present

## 2015-04-29 DIAGNOSIS — B1920 Unspecified viral hepatitis C without hepatic coma: Secondary | ICD-10-CM | POA: Diagnosis present

## 2015-04-29 DIAGNOSIS — Z9114 Patient's other noncompliance with medication regimen: Secondary | ICD-10-CM | POA: Diagnosis not present

## 2015-04-29 DIAGNOSIS — N2889 Other specified disorders of kidney and ureter: Secondary | ICD-10-CM | POA: Diagnosis present

## 2015-04-29 DIAGNOSIS — K047 Periapical abscess without sinus: Secondary | ICD-10-CM

## 2015-04-29 DIAGNOSIS — I160 Hypertensive urgency: Secondary | ICD-10-CM | POA: Diagnosis present

## 2015-04-29 DIAGNOSIS — F17210 Nicotine dependence, cigarettes, uncomplicated: Secondary | ICD-10-CM | POA: Diagnosis present

## 2015-04-29 DIAGNOSIS — I4510 Unspecified right bundle-branch block: Secondary | ICD-10-CM | POA: Diagnosis present

## 2015-04-29 DIAGNOSIS — E785 Hyperlipidemia, unspecified: Secondary | ICD-10-CM | POA: Diagnosis present

## 2015-04-29 DIAGNOSIS — B182 Chronic viral hepatitis C: Secondary | ICD-10-CM | POA: Diagnosis not present

## 2015-04-29 DIAGNOSIS — I452 Bifascicular block: Secondary | ICD-10-CM | POA: Diagnosis present

## 2015-04-29 DIAGNOSIS — I169 Hypertensive crisis, unspecified: Secondary | ICD-10-CM | POA: Diagnosis present

## 2015-04-29 DIAGNOSIS — D696 Thrombocytopenia, unspecified: Secondary | ICD-10-CM | POA: Diagnosis present

## 2015-04-29 DIAGNOSIS — R0789 Other chest pain: Secondary | ICD-10-CM

## 2015-04-29 DIAGNOSIS — R519 Headache, unspecified: Secondary | ICD-10-CM

## 2015-04-29 LAB — URINALYSIS, ROUTINE W REFLEX MICROSCOPIC
Bilirubin Urine: NEGATIVE
GLUCOSE, UA: NEGATIVE mg/dL
Hgb urine dipstick: NEGATIVE
Ketones, ur: NEGATIVE mg/dL
Leukocytes, UA: NEGATIVE
Nitrite: NEGATIVE
PH: 7 (ref 5.0–8.0)
Protein, ur: NEGATIVE mg/dL
Specific Gravity, Urine: 1.008 (ref 1.005–1.030)

## 2015-04-29 LAB — HEPATIC FUNCTION PANEL
ALT: 33 U/L (ref 17–63)
AST: 30 U/L (ref 15–41)
Albumin: 4.4 g/dL (ref 3.5–5.0)
Alkaline Phosphatase: 67 U/L (ref 38–126)
BILIRUBIN DIRECT: 0.1 mg/dL (ref 0.1–0.5)
BILIRUBIN TOTAL: 0.4 mg/dL (ref 0.3–1.2)
Indirect Bilirubin: 0.3 mg/dL (ref 0.3–0.9)
Total Protein: 7.7 g/dL (ref 6.5–8.1)

## 2015-04-29 LAB — CBC WITH DIFFERENTIAL/PLATELET
BASOS PCT: 0 %
Basophils Absolute: 0 10*3/uL (ref 0.0–0.1)
EOS ABS: 0.2 10*3/uL (ref 0.0–0.7)
EOS PCT: 3 %
HCT: 43.9 % (ref 39.0–52.0)
HEMOGLOBIN: 15.8 g/dL (ref 13.0–17.0)
Lymphocytes Relative: 21 %
Lymphs Abs: 1.2 10*3/uL (ref 0.7–4.0)
MCH: 33 pg (ref 26.0–34.0)
MCHC: 36 g/dL (ref 30.0–36.0)
MCV: 91.6 fL (ref 78.0–100.0)
MONOS PCT: 10 %
Monocytes Absolute: 0.6 10*3/uL (ref 0.1–1.0)
Neutro Abs: 4 10*3/uL (ref 1.7–7.7)
Neutrophils Relative %: 66 %
PLATELETS: 133 10*3/uL — AB (ref 150–400)
RBC: 4.79 MIL/uL (ref 4.22–5.81)
RDW: 12.7 % (ref 11.5–15.5)
WBC: 6 10*3/uL (ref 4.0–10.5)

## 2015-04-29 LAB — BASIC METABOLIC PANEL
Anion gap: 7 (ref 5–15)
BUN: 9 mg/dL (ref 6–20)
CALCIUM: 9.9 mg/dL (ref 8.9–10.3)
CO2: 33 mmol/L — ABNORMAL HIGH (ref 22–32)
Chloride: 100 mmol/L — ABNORMAL LOW (ref 101–111)
Creatinine, Ser: 0.95 mg/dL (ref 0.61–1.24)
Glucose, Bld: 92 mg/dL (ref 65–99)
Potassium: 4.3 mmol/L (ref 3.5–5.1)
SODIUM: 140 mmol/L (ref 135–145)

## 2015-04-29 LAB — RAPID URINE DRUG SCREEN, HOSP PERFORMED
Amphetamines: NOT DETECTED
Barbiturates: NOT DETECTED
Benzodiazepines: POSITIVE — AB
COCAINE: NOT DETECTED
OPIATES: POSITIVE — AB
Tetrahydrocannabinol: NOT DETECTED

## 2015-04-29 MED ORDER — TRAMADOL HCL 50 MG PO TABS
100.0000 mg | ORAL_TABLET | Freq: Four times a day (QID) | ORAL | Status: DC | PRN
  Administered 2015-04-30: 100 mg via ORAL
  Filled 2015-04-29: qty 2

## 2015-04-29 MED ORDER — TRAZODONE HCL 100 MG PO TABS
100.0000 mg | ORAL_TABLET | Freq: Every day | ORAL | Status: DC
  Administered 2015-04-29 – 2015-04-30 (×2): 100 mg via ORAL
  Filled 2015-04-29: qty 1
  Filled 2015-04-29: qty 2

## 2015-04-29 MED ORDER — ACETAMINOPHEN 325 MG PO TABS: 650.0000 mg | ORAL_TABLET | Freq: Four times a day (QID) | ORAL | Status: DC | PRN

## 2015-04-29 MED ORDER — SODIUM CHLORIDE 0.9 % IV SOLN
INTRAVENOUS | Status: DC
  Administered 2015-04-29 (×2): 20 mL/h via INTRAVENOUS

## 2015-04-29 MED ORDER — HYDROCODONE-ACETAMINOPHEN 5-325 MG PO TABS
1.0000 | ORAL_TABLET | ORAL | Status: DC | PRN
Start: 2015-04-29 — End: 2015-04-30
  Administered 2015-04-30: 2 via ORAL
  Filled 2015-04-29: qty 2

## 2015-04-29 MED ORDER — ONDANSETRON HCL 4 MG/2ML IJ SOLN: 4.0000 mg | Freq: Four times a day (QID) | INTRAMUSCULAR | Status: DC | PRN

## 2015-04-29 MED ORDER — HYDRALAZINE HCL 20 MG/ML IJ SOLN
10.0000 mg | Freq: Once | INTRAMUSCULAR | Status: AC
  Administered 2015-04-29: 10 mg via INTRAVENOUS
  Filled 2015-04-29: qty 1

## 2015-04-29 MED ORDER — MORPHINE SULFATE (PF) 4 MG/ML IV SOLN
4.0000 mg | Freq: Once | INTRAVENOUS | Status: AC
  Administered 2015-04-29: 4 mg via INTRAVENOUS
  Filled 2015-04-29: qty 1

## 2015-04-29 MED ORDER — ATENOLOL 100 MG PO TABS
100.0000 mg | ORAL_TABLET | Freq: Every day | ORAL | Status: DC
  Administered 2015-04-30 – 2015-05-01 (×2): 100 mg via ORAL
  Filled 2015-04-29: qty 1
  Filled 2015-04-29: qty 4

## 2015-04-29 MED ORDER — ACETAMINOPHEN 650 MG RE SUPP: 650.0000 mg | Freq: Four times a day (QID) | RECTAL | Status: DC | PRN

## 2015-04-29 MED ORDER — ALLOPURINOL 300 MG PO TABS
300.0000 mg | ORAL_TABLET | Freq: Every morning | ORAL | Status: DC
  Administered 2015-04-30 – 2015-05-01 (×2): 300 mg via ORAL
  Filled 2015-04-29: qty 1
  Filled 2015-04-29: qty 3

## 2015-04-29 MED ORDER — NITROGLYCERIN IN D5W 200-5 MCG/ML-% IV SOLN
0.0000 ug/min | INTRAVENOUS | Status: DC
  Administered 2015-04-29: 33.333 ug/min via INTRAVENOUS
  Administered 2015-04-29: 16.667 ug/min via INTRAVENOUS
  Administered 2015-04-30: 50 ug/min via INTRAVENOUS
  Filled 2015-04-29: qty 250

## 2015-04-29 MED ORDER — CARBAMAZEPINE 200 MG PO TABS
200.0000 mg | ORAL_TABLET | Freq: Two times a day (BID) | ORAL | Status: DC
  Administered 2015-04-29 – 2015-05-01 (×4): 200 mg via ORAL
  Filled 2015-04-29 (×6): qty 1

## 2015-04-29 MED ORDER — LISINOPRIL 20 MG PO TABS
20.0000 mg | ORAL_TABLET | Freq: Every day | ORAL | Status: DC
  Administered 2015-04-30 – 2015-05-01 (×2): 20 mg via ORAL
  Filled 2015-04-29: qty 2
  Filled 2015-04-29: qty 1

## 2015-04-29 MED ORDER — ALBUTEROL SULFATE (2.5 MG/3ML) 0.083% IN NEBU: 2.5000 mg | INHALATION_SOLUTION | RESPIRATORY_TRACT | Status: DC | PRN

## 2015-04-29 MED ORDER — LORAZEPAM 2 MG/ML IJ SOLN
1.0000 mg | Freq: Once | INTRAMUSCULAR | Status: AC
  Administered 2015-04-29: 1 mg via INTRAVENOUS
  Filled 2015-04-29: qty 1

## 2015-04-29 MED ORDER — ONDANSETRON HCL 4 MG PO TABS: 4.0000 mg | ORAL_TABLET | Freq: Four times a day (QID) | ORAL | Status: DC | PRN

## 2015-04-29 MED ORDER — SODIUM CHLORIDE 0.9 % IJ SOLN
3.0000 mL | Freq: Two times a day (BID) | INTRAMUSCULAR | Status: DC
  Administered 2015-04-30 – 2015-05-01 (×3): 3 mL via INTRAVENOUS

## 2015-04-29 MED ORDER — ASPIRIN EC 81 MG PO TBEC
81.0000 mg | DELAYED_RELEASE_TABLET | Freq: Every morning | ORAL | Status: DC
  Administered 2015-04-30 – 2015-05-01 (×2): 81 mg via ORAL
  Filled 2015-04-29 (×2): qty 1

## 2015-04-29 MED ORDER — OXYCODONE HCL 5 MG PO TABS
10.0000 mg | ORAL_TABLET | ORAL | Status: DC | PRN
  Administered 2015-04-29 – 2015-05-01 (×9): 10 mg via ORAL
  Filled 2015-04-29 (×9): qty 2

## 2015-04-29 NOTE — ED Provider Notes (Signed)
CSN: [id number]     Arrival date & time 04/29/15  1756 History   First MD Initiated Contact with Patient 04/29/15 1812     Chief Complaint  Patient presents with  . Jaw Pain  . Headache     (Consider location/radiation/quality/duration/timing/severity/associated sxs/prior Treatment) HPI Comments: Patient here complaining of mild headache which began yesterday along with chronic worsening dental pain localized to his left jaw without swelling or fever or drainage. Has a history of hypertension and has not taken his blood pressure medication 1 month. Denies any anginal chest pain. No shortage of breath. No visual changes. No medications used prior to arrival  Patient is a 64 y.o. male presenting with headaches. The history is provided by the patient and a relative.  Headache   Past Medical History  Diagnosis Date  . Pneumonia     recurrent episodes   . Hypertension   . Hyperlipidemia   . Gout   . Vitamin D deficiency   . Hepatitis C before 09-09-1998  . Arthritis   . Obesity     300 # in 09-08-08, BMI 34 in 04/2013.   . Right lower lobe lung mass 05/2008    resolved on follow up CT 09/09/10.   [name] Psychosis 1998-09-09    s/e of treatment for Hepatitis C.    . Influenza A 06/2013    acute resp failure, did not require intubation.  . Diverticulosis of colon 09/08/2009    seen on CT scan in 09-08-2009.   [name] Anxiety and depression 09-Sep-1998    worse after death of wife 2013/03/10  . Anxiety   . Depression    Past Surgical History  Procedure Laterality Date  . Mandible fracture surgery    . Cholecystectomy N/A 01/14/2014    Procedure: LAPAROSCOPIC CHOLECYSTECTOMY WITH INTRAOPERATIVE CHOLANGIOGRAM;  Surgeon: Shann Medal, MD;  Location: [facility];  Service: General;  Laterality: N/A;   Family History  Problem Relation Age of Onset  . Diabetes Mother   . Arthritis Mother   . Cancer Father     pancreatic cancer   Social History  Substance Use Topics  . Smoking status: Current Every Day Smoker -- 1.00 packs/day  for 40 years    Types: Cigarettes  . Smokeless tobacco: Former Systems developer    Quit date: 06/06/2013     Comment: as of 01/2014 smoking 1/2 PPD  . Alcohol Use: No     Comment: drinks occasional beer.  "2 dozen beers" over first 8 months of 08-Sep-2013.     Review of Systems  Neurological: Positive for headaches.  All other systems reviewed and are negative.     Allergies  Review of patient's allergies indicates no known allergies.  Home Medications   Prior to Admission medications   Medication Sig Start Date End Date Taking? Authorizing Provider  albuterol (PROVENTIL HFA;VENTOLIN HFA) 108 (90 BASE) MCG/ACT inhaler Inhale 2-4 puffs into the lungs every 4 (four) hours as needed for wheezing or shortness of breath. 08/29/14   Ernestina Patches, MD  allopurinol (ZYLOPRIM) 300 MG tablet Take 1 tablet (300 mg total) by mouth every morning. 08/29/14   Ernestina Patches, MD  aspirin EC 81 MG tablet Take 1 tablet (81 mg total) by mouth every morning. 08/29/14   Ernestina Patches, MD  atenolol (TENORMIN) 25 MG tablet Take 4 tablets (100 mg total) by mouth daily. 01/02/15   Hanna Patel-Mills, PA-C  carbamazepine (TEGRETOL) 200 MG tablet Take 1 tablet (200 mg total) by mouth 2 (two)  times daily. 06/13/14   Norma Fredrickson, MD  carbamazepine (TEGRETOL) 200 MG tablet 1  bid Patient not taking: Reported on 06/27/2014 06/22/14   Norma Fredrickson, MD  cholecalciferol (VITAMIN D) 1000 UNITS tablet Take 5,000 Units by mouth daily.    Historical Provider, MD  Cholecalciferol (VITAMIN D3) 5000 UNITS TABS Take 1 tablet by mouth daily.    Historical Provider, MD  citalopram (CELEXA) 40 MG tablet Take 1 tablet (40 mg total) by mouth every morning. Patient not taking: Reported on 06/27/2014 05/09/14   Benjamine Mola, FNP  HYDROcodone-acetaminophen (NORCO) 5-325 MG per tablet Take 1 tablet by mouth every 6 (six) hours as needed. Patient not taking: Reported on 01/02/2015 08/30/14   Ernestina Patches, MD  hydrOXYzine (ATARAX/VISTARIL) 50 MG tablet  Take 1 tablet (50 mg total) by mouth 3 (three) times daily as needed for anxiety. Patient not taking: Reported on 07/06/2014 05/09/14   Benjamine Mola, FNP  lisinopril (PRINIVIL,ZESTRIL) 20 MG tablet Take 1 tablet (20 mg total) by mouth daily. 01/02/15   Hanna Patel-Mills, PA-C  mirtazapine (REMERON) 30 MG tablet Take 1 tablet (30 mg total) by mouth at bedtime. Patient not taking: Reported on 07/06/2014 06/10/14   Norma Fredrickson, MD  Multiple Vitamin (MULTIVITAMIN WITH MINERALS) TABS tablet Take 1 tablet by mouth every morning. 05/09/14   Benjamine Mola, FNP  predniSONE (DELTASONE) 20 MG tablet 3 tabs po day one, then 2 po daily x 4 days Patient not taking: Reported on 01/02/2015 08/29/14   Ernestina Patches, MD  Thiamine HCl (VITAMIN B-1 PO) Take 1 tablet by mouth daily.    Historical Provider, MD  traZODone (DESYREL) 100 MG tablet 1 qhs  aftyer 2 nights mayincrease to 2  qhs Patient not taking: Reported on 07/06/2014 06/10/14   Norma Fredrickson, MD   BP 236/112 mmHg  Pulse 92  Temp(Src) 97.6 F (36.4 C) (Oral)  Resp 18  SpO2 100% Physical Exam  Constitutional: He is oriented to person, place, and time. He appears well-developed and well-nourished.  Non-toxic appearance. No distress.  HENT:  Head: Normocephalic and atraumatic.  Poor dentition noted without evidence of abscess  Eyes: Conjunctivae, EOM and lids are normal. Pupils are equal, round, and reactive to light.  Neck: Normal range of motion. Neck supple. No tracheal deviation present. No thyroid mass present.  Cardiovascular: Normal rate, regular rhythm and normal heart sounds.  Exam reveals no gallop.   No murmur heard. Pulmonary/Chest: Effort normal and breath sounds normal. No stridor. No respiratory distress. He has no decreased breath sounds. He has no wheezes. He has no rhonchi. He has no rales.  Abdominal: Soft. Normal appearance and bowel sounds are normal. He exhibits no distension. There is no tenderness. There is no rebound and no CVA  tenderness.  Musculoskeletal: Normal range of motion. He exhibits no edema or tenderness.  Neurological: He is alert and oriented to person, place, and time. He has normal strength. No cranial nerve deficit or sensory deficit. GCS eye subscore is 4. GCS verbal subscore is 5. GCS motor subscore is 6.  Skin: Skin is warm and dry. No abrasion and no rash noted.  Psychiatric: He has a normal mood and affect. His speech is normal and behavior is normal.  Nursing note and vitals reviewed.   ED Course  Procedures (including critical care time) Labs Review Labs Reviewed  CBC WITH DIFFERENTIAL/PLATELET  BASIC METABOLIC PANEL    Imaging Review No results found. I have personally reviewed and evaluated these  images and lab results as part of my medical decision-making.   EKG Interpretation   Date/Time:  Saturday April 29 2015 18:15:45 EST Ventricular Rate:  85 PR Interval:  150 QRS Duration: 109 QT Interval:  374 QTC Calculation: 445 R Axis:   -54 Text Interpretation:  Sinus rhythm Incomplete RBBB and LAFB Abnormal  R-wave progression, early transition Baseline wander in lead(s) I aVL V6  No significant change since last tracing Confirmed by Ayushi Pla  MD, Brelee Renk  ([id number]) on 04/29/2015 9:11:20 PM      MDM   Final diagnoses:  None   Patient given IV hydralazine for his hypertension. Also given morphine and Ativan for pain. Blood pressure remained elevated and was given a second dose hydralazine and continues to remain hypertensive. Will admit to stepdown  CRITICAL CARE Performed by: Leota Jacobsen Total critical care time: 65 minutes Critical care time was exclusive of separately billable procedures and treating other patients. Critical care was necessary to treat or prevent imminent or life-threatening deterioration. Critical care was time spent personally by me on the following activities: development of treatment plan with patient and/or surrogate as well as nursing,  discussions with consultants, evaluation of patient's response to treatment, examination of patient, obtaining history from patient or surrogate, ordering and performing treatments and interventions, ordering and review of laboratory studies, ordering and review of radiographic studies, pulse oximetry and re-evaluation of patient's condition.    Lacretia Leigh, MD 04/29/15 2111

## 2015-04-29 NOTE — ED Notes (Addendum)
Pt presents with c/o jaw pain and headache. Pt reports the jaw pain started yesterday, no injury noted. Pt reporting the headache started today. Pt reporting some nausea as well. Pt reports the pain in his jaw is related to the dental pain he is having.

## 2015-04-29 NOTE — ED Notes (Signed)
Charge RN in ICU will call when the unit can accept this patient.

## 2015-04-29 NOTE — ED Notes (Signed)
When asking pt the suicide risk assessment questions, he reports that he wishes he were dead but he does not want to actually kill himself.

## 2015-04-29 NOTE — ED Notes (Signed)
ED MD Belfi made aware of pt's elevated BP and statements made during suicide risk assessment.

## 2015-04-29 NOTE — Progress Notes (Signed)
Tired calling to get from ED. RN unavailable will try again.

## 2015-04-29 NOTE — ED Notes (Signed)
MD made aware of vitals; orders received.

## 2015-04-29 NOTE — ED Notes (Signed)
Pt reports has not seen dentist in years, reports dental pain

## 2015-04-29 NOTE — ED Notes (Signed)
Pt has a hx of high bp but refuses to see a primary doctor and has not had meds over a month

## 2015-04-29 NOTE — ED Notes (Signed)
RN Manuela Schwartz getting IV

## 2015-04-29 NOTE — H&P (Signed)
PCP: none  Referring provider ALLEN   Chief Complaint:  toothache HPI: Jonathan Wilson is a 64 y.o. 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   Presented with jaw pain and headache. Patient have had similar pain in the past secondary to tooth infection reports no injury. Patient reports weight loss.  Today he started to have a headache. Of note patient has not been taking any of his medications for quite some time because he has no refills and no PCP. Patient have been non compliant. He was noted to be severely hypertensive in emergency department with pressure up to 239/121. He was given hydralazine 10 mg IV 2 doses Ativan 1 mg and morphine 4 mg 2 doses with persistent hypertension.  He reports occasional chest pain. He says it happens a lot. Today he had chest pain mid morning.  He had hx of renal cancer but had not followed since august 2015. Has hx of Hepatitis C but was tod it was cured.  Hospitalist was called for admission for hypertensive urgency  Review of Systems:    Pertinent positives include: headache and jaw pain, right side back pain weight loss   Constitutional:  No weight loss, night sweats, Fevers, chills, fatigue,  HEENT:  No headaches, Difficulty swallowing,Tooth/dental problems,Sore throat,  No sneezing, itching, ear ache, nasal congestion, post nasal drip,  Cardio-vascular:  No chest pain, Orthopnea, PND, anasarca, dizziness, palpitations.no Bilateral lower extremity swelling  GI:  No heartburn, indigestion, abdominal pain, nausea, vomiting, diarrhea, change in bowel habits, loss of appetite, melena, blood in stool, hematemesis Resp:  no shortness of breath at rest. No dyspnea on  exertion, No excess mucus, no productive cough, No non-productive cough, No coughing up of blood.No change in color of mucus.No wheezing. Skin:  no rash or lesions. No jaundice GU:  no dysuria, change in color of urine, no urgency or frequency. No straining to urinate.  No flank pain.  Musculoskeletal:  No joint pain or no joint swelling. No decreased range of motion. No back pain.  Psych:  No change in mood or affect. No depression or anxiety. No memory loss.  Neuro: no localizing neurological complaints, no tingling, no weakness, no double vision, no gait abnormality, no slurred speech, no confusion  Otherwise ROS are negative except for above, 10 systems were reviewed  Past Medical History: Past Medical History  Diagnosis Date  . Pneumonia     recurrent episodes   . Hypertension   . Hyperlipidemia   . Gout   . Vitamin D deficiency   . Hepatitis C before Sep 23, 1998  . Arthritis   . Obesity     300 # in 2008/09/22, BMI 34 in 04/2013.   . Right lower lobe lung mass 05/2008    resolved on follow up CT 09/23/10.   [name] Psychosis 09-23-98    s/e of treatment for Hepatitis C.    . Influenza A 06/2013    acute resp failure, did not require intubation.  . Diverticulosis of colon 22-Sep-2009    seen on CT scan in September 22, 2009.   [name] Anxiety and depression 1998-09-23    worse after death of wife 03/24/2013  . Anxiety   . Depression    Past Surgical History  Procedure Laterality Date  . Mandible fracture  surgery    . Cholecystectomy N/A 01/14/2014    Procedure: LAPAROSCOPIC CHOLECYSTECTOMY WITH INTRAOPERATIVE CHOLANGIOGRAM;  Surgeon: Shann Medal, MD;  Location: [facility];  Service: General;  Laterality: N/A;     Medications: Prior to Admission medications   Medication Sig Start Date End Date Taking? Authorizing Provider  albuterol (PROVENTIL HFA;VENTOLIN HFA) 108 (90 BASE) MCG/ACT inhaler Inhale 2-4 puffs into the lungs every 4 (four) hours as needed for wheezing or shortness of breath. 08/29/14  Yes Ernestina Patches, MD    allopurinol (ZYLOPRIM) 300 MG tablet Take 1 tablet (300 mg total) by mouth every morning. 08/29/14  Yes Ernestina Patches, MD  aspirin EC 81 MG tablet Take 1 tablet (81 mg total) by mouth every morning. 08/29/14  Yes Ernestina Patches, MD  atenolol (TENORMIN) 25 MG tablet Take 4 tablets (100 mg total) by mouth daily. 01/02/15  Yes Hanna Patel-Mills, PA-C  carbamazepine (TEGRETOL) 200 MG tablet Take 1 tablet (200 mg total) by mouth 2 (two) times daily. 06/13/14  Yes Norma Fredrickson, MD  Cholecalciferol (VITAMIN D3) 5000 UNITS TABS Take 1 tablet by mouth daily.   Yes Historical Provider, MD  Cyanocobalamin (VITAMIN B-12 PO) Take 1 tablet by mouth daily.   Yes Historical Provider, MD  Multiple Vitamin (MULTIVITAMIN WITH MINERALS) TABS tablet Take 1 tablet by mouth every morning. 05/09/14  Yes Benjamine Mola, FNP  carbamazepine (TEGRETOL) 200 MG tablet 1  bid Patient not taking: Reported on 06/27/2014 06/22/14   Norma Fredrickson, MD  citalopram (CELEXA) 40 MG tablet Take 1 tablet (40 mg total) by mouth every morning. Patient not taking: Reported on 06/27/2014 05/09/14   Benjamine Mola, FNP  HYDROcodone-acetaminophen (NORCO) 5-325 MG per tablet Take 1 tablet by mouth every 6 (six) hours as needed. Patient not taking: Reported on 01/02/2015 08/30/14   Ernestina Patches, MD  hydrOXYzine (ATARAX/VISTARIL) 50 MG tablet Take 1 tablet (50 mg total) by mouth 3 (three) times daily as needed for anxiety. Patient not taking: Reported on 07/06/2014 05/09/14   Benjamine Mola, FNP  lisinopril (PRINIVIL,ZESTRIL) 20 MG tablet Take 1 tablet (20 mg total) by mouth daily. 01/02/15   Hanna Patel-Mills, PA-C  mirtazapine (REMERON) 30 MG tablet Take 1 tablet (30 mg total) by mouth at bedtime. Patient not taking: Reported on 07/06/2014 06/10/14   Norma Fredrickson, MD  predniSONE (DELTASONE) 20 MG tablet 3 tabs po day one, then 2 po daily x 4 days Patient not taking: Reported on 01/02/2015 08/29/14   Ernestina Patches, MD  traZODone (DESYREL) 100 MG tablet 1 qhs   aftyer 2 nights mayincrease to 2  qhs Patient not taking: Reported on 07/06/2014 06/10/14   Norma Fredrickson, MD    Allergies:  No Known Allergies  Social History:  Ambulatory    Independently  Lives at home with brother in law        reports that he has been smoking Cigarettes.  He has a 40 pack-year smoking history. He quit smokeless tobacco use about 22 months ago. He reports that he does not drink alcohol or use illicit drugs.    Family History: family history includes Arthritis in his mother; Cancer in his father; Diabetes in his mother.    Physical Exam: Patient Vitals for the past 24 hrs:  BP Temp Temp src Pulse Resp SpO2  04/29/15 2025 (!) 239/121 mmHg - - - - -  04/29/15 2015 (!) 239/121 mmHg - - 80 23 97 %  04/29/15 2000 (!) 241/130 mmHg - - 80  13 99 %  04/29/15 1945 (!) 231/105 mmHg - - 81 20 99 %  04/29/15 1930 (!) 212/108 mmHg - - 77 15 98 %  04/29/15 1904 (!) 214/128 mmHg - - 84 16 98 %  04/29/15 1818 (!) 236/112 mmHg - - - - -  04/29/15 1802 (!) 240/123 mmHg - - - - -  04/29/15 1801 (!) 211/166 mmHg 97.6 F (36.4 C) Oral 92 18 100 %    1. General:  in No Acute distress 2. Psychological: Alert and  Oriented 3. Head/ENT:   Moist  Mucous Membranes                          Head Non traumatic, neck supple                          Poor Dentition with severe gum disease, no significant swelling noted 4. SKIN: normal  Skin turgor,  Skin clean Dry and intact no rash 5. Heart: Regular rate and rhythm no Murmur, Rub or gallop 6. Lungs: Clear to auscultation bilaterally, no wheezes or crackles   7. Abdomen: Soft, RUQ mild tenderness, Non distended 8. Lower extremities: no clubbing, cyanosis, or edema 9. Neurologically Grossly intact, moving all 4 extremities equally 10. MSK: Normal range of motion  body mass index is unknown because there is no weight on file.   Labs on Admission:   Results for orders placed or performed during the hospital encounter of 04/29/15  (from the past 24 hour(s))  CBC with Differential/Platelet     Status: Abnormal   Collection Time: 04/29/15  6:32 PM  Result Value Ref Range   WBC 6.0 4.0 - 10.5 K/uL   RBC 4.79 4.22 - 5.81 MIL/uL   Hemoglobin 15.8 13.0 - 17.0 g/dL   HCT 43.9 39.0 - 52.0 %   MCV 91.6 78.0 - 100.0 fL   MCH 33.0 26.0 - 34.0 pg   MCHC 36.0 30.0 - 36.0 g/dL   RDW 12.7 11.5 - 15.5 %   Platelets 133 (L) 150 - 400 K/uL   Neutrophils Relative % 66 %   Neutro Abs 4.0 1.7 - 7.7 K/uL   Lymphocytes Relative 21 %   Lymphs Abs 1.2 0.7 - 4.0 K/uL   Monocytes Relative 10 %   Monocytes Absolute 0.6 0.1 - 1.0 K/uL   Eosinophils Relative 3 %   Eosinophils Absolute 0.2 0.0 - 0.7 K/uL   Basophils Relative 0 %   Basophils Absolute 0.0 0.0 - 0.1 K/uL  Basic metabolic panel     Status: Abnormal   Collection Time: 04/29/15  6:32 PM  Result Value Ref Range   Sodium 140 135 - 145 mmol/L   Potassium 4.3 3.5 - 5.1 mmol/L   Chloride 100 (L) 101 - 111 mmol/L   CO2 33 (H) 22 - 32 mmol/L   Glucose, Bld 92 65 - 99 mg/dL   BUN 9 6 - 20 mg/dL   Creatinine, Ser 0.95 0.61 - 1.24 mg/dL   Calcium 9.9 8.9 - 10.3 mg/dL   GFR calc non Af Amer >60 >60 mL/min   GFR calc Af Amer >60 >60 mL/min   Anion gap 7 5 - 15    UA ordered  No results found for: HGBA1C  CrCl cannot be calculated (Unknown ideal weight.).  BNP (last 3 results) No results for input(s): PROBNP in the last 8760 hours.  Other results:  I  have pearsonaly reviewed this: ECG REPORT  Rate:85  Rhythm: RBBB ST&T Change: no ischemic changes QTC wnl  There were no vitals filed for this visit.   Cultures:    Component Value Date/Time   SDES URINE, CLEAN CATCH 06/22/2013 1223   SPECREQUEST NONE 06/22/2013 1223   CULT NO GROWTH Performed at [facility] 06/22/2013 1223   REPTSTATUS 06/23/2013 FINAL 06/22/2013 1223     Radiological Exams on Admission: No results found.  Chart has been reviewed  Family not at  Bedside     Assessment/Plan  64 yo male with history of poorly controlled hypertension and renal mass with medical noncompliance history of hepatitis C presents with jaw pain and headache worrisome for hypertensive urgency Present on Admission:  . Hypertensive urgency - admit to stepdown on nitro drip will restart home medications and wean off nitro drip when able . Renal mass, right - failure to follow up, loss of weight worrisome for progression will obtain CT abd/pelvis . COPD (chronic obstructive pulmonary disease) (HCC) - albuterol PRN, currently stable, continues to smoke . Thrombocytopenia ([place]) - unclear etiology will check liver function,  . Hepatitis C - check hepatitis panel and HIV status . Chest pain - reccurent unable to provide details. Will cycle CE and obtain echo . Abnormal EKG: incomplete RBBB and LAFB - monior on tele   headache will obtain CT of the head to evaluate for head bleed  Jaw pain will need to eval for possible abscess will need to follow up with dentist, avoid IV narcotics no hx of vomiting Prophylaxis:  SCD  CODE STATUS:   DNR/DNI as per patient    Disposition:  To home once workup is complete and patient is stable  Other plan as per orders.  I have spent a total of 74min on this admission  Chasta Deshpande 04/29/2015, 9:11 PM  [facility]  Pager [phone]   after 2 AM please page floor coverage PA If 7AM-7PM, please contact the day team taking care of the patient  [email or web address]  Password [email or web address]

## 2015-04-30 ENCOUNTER — Inpatient Hospital Stay (HOSPITAL_COMMUNITY): Payer: BLUE CROSS/BLUE SHIELD

## 2015-04-30 ENCOUNTER — Encounter (HOSPITAL_COMMUNITY): Payer: Self-pay | Admitting: Radiology

## 2015-04-30 DIAGNOSIS — B182 Chronic viral hepatitis C: Secondary | ICD-10-CM

## 2015-04-30 DIAGNOSIS — R079 Chest pain, unspecified: Secondary | ICD-10-CM

## 2015-04-30 DIAGNOSIS — N2889 Other specified disorders of kidney and ureter: Secondary | ICD-10-CM

## 2015-04-30 DIAGNOSIS — I160 Hypertensive urgency: Secondary | ICD-10-CM

## 2015-04-30 DIAGNOSIS — R9431 Abnormal electrocardiogram [ECG] [EKG]: Secondary | ICD-10-CM

## 2015-04-30 LAB — CBC
HCT: 42.3 % (ref 39.0–52.0)
Hemoglobin: 14.6 g/dL (ref 13.0–17.0)
MCH: 32.5 pg (ref 26.0–34.0)
MCHC: 34.5 g/dL (ref 30.0–36.0)
MCV: 94.2 fL (ref 78.0–100.0)
PLATELETS: 136 10*3/uL — AB (ref 150–400)
RBC: 4.49 MIL/uL (ref 4.22–5.81)
RDW: 13 % (ref 11.5–15.5)
WBC: 6.5 10*3/uL (ref 4.0–10.5)

## 2015-04-30 LAB — COMPREHENSIVE METABOLIC PANEL
ALK PHOS: 89 U/L (ref 38–126)
ALT: 41 U/L (ref 17–63)
AST: 41 U/L (ref 15–41)
Albumin: 3.9 g/dL (ref 3.5–5.0)
Anion gap: 9 (ref 5–15)
BUN: 12 mg/dL (ref 6–20)
CALCIUM: 9.4 mg/dL (ref 8.9–10.3)
CHLORIDE: 95 mmol/L — AB (ref 101–111)
CO2: 32 mmol/L (ref 22–32)
CREATININE: 1 mg/dL (ref 0.61–1.24)
Glucose, Bld: 116 mg/dL — ABNORMAL HIGH (ref 65–99)
Potassium: 4.9 mmol/L (ref 3.5–5.1)
SODIUM: 136 mmol/L (ref 135–145)
Total Bilirubin: 0.4 mg/dL (ref 0.3–1.2)
Total Protein: 6.9 g/dL (ref 6.5–8.1)

## 2015-04-30 LAB — LIPID PANEL
CHOL/HDL RATIO: 5.9 ratio
CHOLESTEROL: 178 mg/dL (ref 0–200)
HDL: 30 mg/dL — AB (ref >40)
LDL CALC: 114 mg/dL — AB (ref 0–99)
TRIGLYCERIDES: 171 mg/dL — AB (ref <150)
VLDL: 34 mg/dL (ref 0–40)

## 2015-04-30 LAB — PHOSPHORUS: PHOSPHORUS: 4.4 mg/dL (ref 2.5–4.6)

## 2015-04-30 LAB — LIPASE, BLOOD: Lipase: 24 U/L (ref 11–51)

## 2015-04-30 LAB — TROPONIN I
Troponin I: <0.03 ng/mL (ref <0.031)
Troponin I: <0.03 ng/mL (ref <0.031)

## 2015-04-30 LAB — MAGNESIUM: Magnesium: 1.9 mg/dL (ref 1.7–2.4)

## 2015-04-30 LAB — MRSA PCR SCREENING: MRSA BY PCR: NEGATIVE

## 2015-04-30 LAB — HIV ANTIBODY (ROUTINE TESTING W REFLEX): HIV Screen 4th Generation wRfx: NONREACTIVE

## 2015-04-30 LAB — TSH: TSH: 1.546 u[IU]/mL (ref 0.350–4.500)

## 2015-04-30 MED ORDER — IOHEXOL 300 MG/ML  SOLN
75.0000 mL | Freq: Once | INTRAMUSCULAR | Status: AC | PRN
  Administered 2015-04-30: 75 mL via INTRAVENOUS

## 2015-04-30 MED ORDER — SODIUM CHLORIDE 0.9 % IV SOLN
3.0000 g | Freq: Four times a day (QID) | INTRAVENOUS | Status: DC
  Administered 2015-04-30 – 2015-05-01 (×5): 3 g via INTRAVENOUS
  Filled 2015-04-30 (×7): qty 3

## 2015-04-30 NOTE — Progress Notes (Signed)
*  PRELIMINARY RESULTS* Echocardiogram 2D Echocardiogram has been performed.  Beryle Beams 04/30/2015, 2:03 PM

## 2015-04-30 NOTE — Progress Notes (Signed)
Paged Kathline Magic NP about Pt Stat CT and echo exam. Pt's  BP  212 /111 at this time on Nitro drip. Order received  to hold off on Test for now. Will continue to monitor.

## 2015-04-30 NOTE — Progress Notes (Signed)
MD informed that CT result were in computer.

## 2015-04-30 NOTE — Progress Notes (Signed)
[facility] PROGRESS NOTE  Jonathan Wilson [id number] DOB: 08/26/1950 DOA: 04/29/2015 PCP: No primary care provider on file.  Assessment/Plan: 1. Hypertensive urgency: - resolved. Off the nitro drip and transferred to telemetry.   2. Right renal mass: Follow up with CT show worrisome for renal cell carcinoma.  Plan to call renal in am and possibly IR for biopsy.   H/o hepatitis C:  Checking hepatitis panel and HIV status.    Chest pain : resolved.  Echocardiogram done and results pending.   Jaw pain: Poor dental hygiene.  No abscess seen on CT.   Code Status: dnr Family Communication: friend at bedside Disposition Plan: pending further work up. Will need another 2 day stay.   Consultants:  none  Procedures: CT abdomen a nd pelvis  Ct maxillo facial.  Antibiotics:  unasyn 11/27  HPI/Subjective: No new complaints.   Objective: Filed Vitals:   04/30/15 0800 04/30/15 1046  BP: 125/66 127/83  Pulse: 87 77  Temp: 98.1 F (36.7 C)   Resp: 10 14    Intake/Output Summary (Last 24 hours) at 04/30/15 1330 Last data filed at 04/30/15 1100  Gross per 24 hour  Intake 487.92 ml  Output      0 ml  Net 487.92 ml   Filed Weights   04/29/15 2350  Weight: 114.6 kg (252 lb 10.4 oz)    Exam:   General:  Alert comfortable,   Cardiovascular: s1s2  Respiratory: ctab  Abdomen: soft NT ndbs+  Musculoskeletal: trace pedal edema.   Data Reviewed: Basic Metabolic Panel:  Recent Labs Lab 04/29/15 1832 04/30/15 0520  NA 140 136  K 4.3 4.9  CL 100* 95*  CO2 33* 32  GLUCOSE 92 116*  BUN 9 12  CREATININE 0.95 1.00  CALCIUM 9.9 9.4  MG  --  1.9  PHOS  --  4.4   Liver Function Tests:  Recent Labs Lab 04/29/15 1832 04/30/15 0520  AST 30 41  ALT 33 41  ALKPHOS 67 89  BILITOT 0.4 0.4  PROT 7.7 6.9  ALBUMIN 4.4 3.9    Recent Labs Lab 04/30/15 0018  LIPASE 24   No results for input(s): AMMONIA in the last 168 hours. CBC:  Recent  Labs Lab 04/29/15 1832 04/30/15 0520  WBC 6.0 6.5  NEUTROABS 4.0  --   HGB 15.8 14.6  HCT 43.9 42.3  MCV 91.6 94.2  PLT 133* 136*   Cardiac Enzymes:  Recent Labs Lab 04/30/15 0018 04/30/15 0520 04/30/15 0905  TROPONINI <0.03 <0.03 <0.03   BNP (last 3 results) No results for input(s): BNP in the last 8760 hours.  ProBNP (last 3 results) No results for input(s): PROBNP in the last 8760 hours.  CBG: No results for input(s): GLUCAP in the last 168 hours.  Recent Results (from the past 240 hour(s))  MRSA PCR Screening     Status: None   Collection Time: 04/30/15 12:21 AM  Result Value Ref Range Status   MRSA by PCR NEGATIVE NEGATIVE Final    Comment:        The GeneXpert MRSA Assay (FDA approved for NASAL specimens only), is one component of a comprehensive MRSA colonization surveillance program. It is not intended to diagnose MRSA infection nor to guide or monitor treatment for MRSA infections.      Studies: No results found.  Scheduled Meds: . allopurinol  300 mg Oral q morning - 10a  . ampicillin-sulbactam (UNASYN) IV  3 g Intravenous 4 times per day  .  aspirin EC  81 mg Oral q morning - 10a  . atenolol  100 mg Oral Daily  . carbamazepine  200 mg Oral BID  . lisinopril  20 mg Oral Daily  . sodium chloride  3 mL Intravenous Q12H  . traZODone  100 mg Oral QHS   Continuous Infusions: . sodium chloride 20 mL/hr (04/29/15 2336)  . nitroGLYCERIN Stopped (04/30/15 [id number])    Active Problems:   COPD (chronic obstructive pulmonary disease) (HCC)   Hepatitis C   Abnormal EKG: incomplete RBBB and LAFB   Renal mass, right   Hypertensive urgency   Thrombocytopenia ([place])   Hypertensive crisis   Chest pain    Time spent: 15 minutes.     [facility]  [facility] Pager [phone]  If 7PM-7AM, please contact night-coverage at [email or web address], password [facility] 04/30/2015, 1:30 PM  LOS: 1 day

## 2015-04-30 NOTE — Progress Notes (Signed)
Pt tx from ED with personal belonging, pants, T-shirt, necklace around neck. No family present at this time, skin dry and intact. Bruises noted to back and upper arms. Will continue to monitor.

## 2015-05-01 DIAGNOSIS — I169 Hypertensive crisis, unspecified: Principal | ICD-10-CM

## 2015-05-01 LAB — HEPATITIS PANEL, ACUTE
HEP A IGM: NEGATIVE
HEP B C IGM: NEGATIVE
HEP B S AG: NEGATIVE

## 2015-05-01 MED ORDER — ALBUTEROL SULFATE HFA 108 (90 BASE) MCG/ACT IN AERS: 2.0000 | INHALATION_SPRAY | RESPIRATORY_TRACT | Status: DC | PRN

## 2015-05-01 MED ORDER — OXYCODONE HCL 10 MG PO TABS
10.0000 mg | ORAL_TABLET | ORAL | Status: DC | PRN
Start: 2015-05-01 — End: 2015-05-24

## 2015-05-01 MED ORDER — CARBAMAZEPINE 200 MG PO TABS: 200.0000 mg | ORAL_TABLET | Freq: Two times a day (BID) | ORAL | Status: DC

## 2015-05-01 MED ORDER — ATENOLOL 25 MG PO TABS: 100.0000 mg | ORAL_TABLET | Freq: Every day | ORAL | Status: DC

## 2015-05-01 MED ORDER — ALLOPURINOL 300 MG PO TABS: 300.0000 mg | ORAL_TABLET | Freq: Every morning | ORAL | Status: DC

## 2015-05-01 MED ORDER — ASPIRIN EC 81 MG PO TBEC: 81.0000 mg | DELAYED_RELEASE_TABLET | Freq: Every morning | ORAL | Status: DC

## 2015-05-01 MED ORDER — TRAZODONE HCL 100 MG PO TABS: ORAL_TABLET | ORAL | Status: DC

## 2015-05-01 MED ORDER — MIRTAZAPINE 30 MG PO TABS: 30.0000 mg | ORAL_TABLET | Freq: Every day | ORAL | Status: DC

## 2015-05-01 MED ORDER — LISINOPRIL 20 MG PO TABS: 20.0000 mg | ORAL_TABLET | Freq: Every day | ORAL | Status: DC

## 2015-05-01 NOTE — Progress Notes (Signed)
Patient ID: Jonathan Wilson, male   DOB: 1950-09-03, 64 y.o.   MRN: [id number] Request received for right renal mass biopsy on pt. Case reviewed by Dr. Vernard Gambles [phone]) and he recommends urology consult prior to proceeding with bx. Dr. Karleen Hampshire notified. Will monitor.

## 2015-05-01 NOTE — Discharge Summary (Addendum)
Physician Discharge Summary  Jonathan Wilson [id number] DOB: 16-Jul-1950 DOA: 04/29/2015  PCP: No primary care provider on file.  Admit date: 04/29/2015 Discharge date: 05/02/2015  Time spent: 25 minutes  Recommendations for Outpatient Follow-up:  1. Follow up with Urology as recommended.    Discharge Diagnoses:  Active Problems:   COPD (chronic obstructive pulmonary disease) (HCC)   Hepatitis C   Abnormal EKG: incomplete RBBB and LAFB   Renal mass, right   Hypertensive urgency   Thrombocytopenia ([place])   Hypertensive crisis   Chest pain   Discharge Condition: improved  Diet recommendation: low sodium diet.   Filed Weights   04/29/15 2350  Weight: 114.6 kg (252 lb 10.4 oz)    History of present illness:  Jonathan Wilson is a 64 y.o. male   has a past medical history of Pneumonia; Hypertension; Hyperlipidemia; Gout; Vitamin D deficiency; Hepatitis C (before 2000); Arthritis; Obesity admitted for jaw pain and headache,. He was found to bein hypertensive crisis.   Hospital Course:  1. Hypertensive urgency: - resolved. Off the nitro drip and transferred to telemetry.  - HAS BEEN off the anti hypertensive meds for a while, he was restarted on all his meds and his BP is much stable on discharge.  - gaveh im all the prescriptions and consulted case manager for PCP appts.    2. Right renal mass: Follow up with CT show worrisome for renal cell carcinoma.  Urology consulted and Dr Jeffie Pollock recommended patient to be seen by [name] in the office as soon as possible.  Initially IR consulted for BIOPSY, but deferred to [name].  Dr Jeffie Pollock suggested that he will update [name] about the patient.   H/o hepatitis C:  No new complaints.    Chest pain : resolved.  Echocardiogram done and results show LVEF of 60 to 65%, with moderate LVH, regional wall abnormalities. And with grade 1 diastolic dysfunction probably from uncontrolled BP.   Jaw pain: Poor dental hygiene.  No  abscess seen on CT.  Discharged on oral augmentin to complete the course.  Procedures:  none  Consultations:  Urology Dr Jeffie Pollock  IR  Discharge Exam: Filed Vitals:   04/30/15 2047 05/01/15 0548  BP: 104/60 109/78  Pulse: 66 74  Temp: 98.2 F (36.8 C) 99.2 F (37.3 C)  Resp: 18 18    General: ALERT AFEBRILE COMFORTABLE Cardiovascular: s1s2 Respiratory: ctab  Discharge Instructions   Discharge Instructions    Diet - low sodium heart healthy    Complete by:  As directed      Discharge instructions    Complete by:  As directed   Please [facility] with [facility] as soon as possible.     Increase activity slowly    Complete by:  As directed           Discharge Medication List as of 05/01/2015  2:36 PM    START taking these medications   Details  oxyCODONE 10 MG TABS Take 1 tablet (10 mg total) by mouth every 4 (four) hours as needed for severe pain or breakthrough pain., Starting 05/01/2015, Until Discontinued, Print      CONTINUE these medications which have CHANGED   Details  albuterol (PROVENTIL HFA;VENTOLIN HFA) 108 (90 BASE) MCG/ACT inhaler Inhale 2-4 puffs into the lungs every 4 (four) hours as needed for wheezing or shortness of breath., Starting 05/01/2015, Until Discontinued, Print    allopurinol (ZYLOPRIM) 300 MG tablet Take 1 tablet (300  mg total) by mouth every morning., Starting 05/01/2015, Until Discontinued, Print    aspirin EC 81 MG tablet Take 1 tablet (81 mg total) by mouth every morning., Starting 05/01/2015, Until Discontinued, Print    atenolol (TENORMIN) 25 MG tablet Take 4 tablets (100 mg total) by mouth daily., Starting 05/01/2015, Until Discontinued, Print    carbamazepine (TEGRETOL) 200 MG tablet Take 1 tablet (200 mg total) by mouth 2 (two) times daily., Starting 05/01/2015, Until Discontinued, Print    lisinopril (PRINIVIL,ZESTRIL) 20 MG tablet Take 1 tablet (20 mg total) by mouth daily., Starting 05/01/2015, Until  Discontinued, Print    traZODone (DESYREL) 100 MG tablet 1 qhs  aftyer 2 nights mayincrease to 2  qhs, Print      CONTINUE these medications which have NOT CHANGED   Details  Cholecalciferol (VITAMIN D3) 5000 UNITS TABS Take 1 tablet by mouth daily., Until Discontinued, Historical Med    Cyanocobalamin (VITAMIN B-12 PO) Take 1 tablet by mouth daily., Until Discontinued, Historical Med    Multiple Vitamin (MULTIVITAMIN WITH MINERALS) TABS tablet Take 1 tablet by mouth every morning., Starting 05/09/2014, Until Discontinued, No Print      STOP taking these medications     citalopram (CELEXA) 40 MG tablet      HYDROcodone-acetaminophen (NORCO) 5-325 MG per tablet      hydrOXYzine (ATARAX/VISTARIL) 50 MG tablet      mirtazapine (REMERON) 30 MG tablet      predniSONE (DELTASONE) 20 MG tablet        No Known Allergies Follow-up Information    Follow up with [name], MD. Schedule an appointment as soon as possible for a visit on 05/01/2015.   Specialty:  Urology   Contact information:   [place] [address] [phone]       Follow up with [facility]. Schedule an appointment as soon as possible for a visit today.   Contact information:   [address] [id number] [place] [phone]       The results of significant diagnostics from this hospitalization (including imaging, microbiology, ancillary and laboratory) are listed below for reference.    Significant Diagnostic Studies: Ct Abdomen Pelvis W Contrast  04/30/2015  CLINICAL DATA:  Right upper quadrant abdominal pain. EXAM: CT ABDOMEN AND PELVIS WITH CONTRAST TECHNIQUE: Multidetector CT imaging of the abdomen and pelvis was performed using the standard protocol following bolus administration of intravenous contrast. CONTRAST:  35mL OMNIPAQUE IOHEXOL 300 MG/ML  SOLN COMPARISON:  CT of the abdomen and pelvis 01/16/2014. FINDINGS: Lower chest: Ground-glass  attenuation is asymmetrically present at the right lung base. The left lung base is clear. The heart size is normal. No significant pleural or pericardial effusion is present. Hepatobiliary: The liver is within normal limits. The common bile duct is within normal limits following cholecystectomy. Pancreas: Unremarkable. Spleen: Within normal limits Adrenals/Urinary Tract: The adrenal glands are within normal limits bilaterally. A 2.8 x 2.7 x 3.5 cm exophytic lesion at the upper pole of the right kidney demonstrates interval growth. This is high-density lesion with some peripheral enhancement compared to the previous noncontrast study. No other focal renal lesions are present. The ureters are within normal limits bilaterally. Urinary bladder is unremarkable. Stomach/Bowel: The stomach is moderately distended. No focal lesions are present. The duodenum and small bowel are unremarkable. Oral contrast can be seen in the distal small bowel to the level of the cecum. The appendix is visualized and normal. The remainder the colon  is unremarkable. Vascular/Lymphatic: Atherosclerotic changes are present in the abdominal aorta without aneurysm. There is marked tortuosity of the splenic artery. No significant adenopathy is present. Reproductive: Within normal limits Other: No free fluid is present Musculoskeletal: A vacuum disc is present at L5-S1. Asymmetric left-sided facet changes are present at L4-5. No focal lytic or blastic lesions are present. Rightward curvature is stable. IMPRESSION: 1. Asymmetric right basilar airspace disease concerning for pneumonia or aspiration. 2. Exophytic 3.5 cm right renal mass demonstrates interval growth and is concerning for a renal cell carcinoma. 3. Extensive atherosclerotic change without aneurysm. 4. Cholecystectomy. 5. Degenerative changes of the lumbar spine as described. These results will be called to the ordering clinician or representative by the Radiologist Assistant, and  communication documented in the PACS or zVision Dashboard. Electronically Signed   By: San Morelle M.D.   On: 04/30/2015 16:53   Ct Maxillofacial W/cm  04/30/2015  CLINICAL DATA:  Headache beginning yesterday. Left mandible pain. Hypertension. The patient is not taking this medication for 1 month. EXAM: CT MAXILLOFACIAL WITH CONTRAST TECHNIQUE: Multidetector CT imaging of the maxillofacial structures was performed with intravenous contrast. Multiplanar CT image reconstructions were also generated. A small metallic BB was placed on the right temple in order to reliably differentiate right from left. CONTRAST:  82mL OMNIPAQUE IOHEXOL 300 MG/ML SOLN, 17mL OMNIPAQUE IOHEXOL 300 MG/ML SOLN COMPARISON:  CT head without contrast 06/03/2006 FINDINGS: Asymmetric soft tissue swelling is present over the left side of the face at the level of the mandible. Prominent dental caries are present within the 3 most posterior residual left mandibular teeth. There are prominent periapical lucencies associated with each of these teeth. Periapical lucency is also noted about the mandibular incisors, the right canine tooth and the lateral incisor. Prominent maxillary caries are present as well. There is a periapical lucency in the medial right maxillary incisor, the left pre canine, and each of the right molar teeth as well as the right premolar tooth. The left mandibular dental caries and periapical disease appears to be the center of the inflammatory changes in the left side of the face. A discrete subperiosteal abscess is not evident. Enlarged submandibular lymph nodes are present bilaterally, left greater than right. These are likely reactive. No other significant adenopathy is present. The mandible itself is located and intact. Degenerative changes are present upper cervical spine, particularly at C4-5 and C5-6. Limited imaging of the brain is unremarkable. Globes and orbits are intact. The soft tissues the face are  otherwise unremarkable. The tongue is within normal limits. Salivary glands are unremarkable. IMPRESSION: 1. Extensive dental and periodontal disease appears to be the etiology of soft tissue inflammation in stranding adjacent to the left mandible. 2. No discrete subperiosteal abscess. 3. Reactive type adenopathy in the left greater than right submandibular spaces. 4. Degenerative changes of the cervical spine. These results will be called to the ordering clinician or representative by the Radiologist Assistant, and communication documented in the PACS or zVision Dashboard. Electronically Signed   By: San Morelle M.D.   On: 04/30/2015 17:03    Microbiology: Recent Results (from the past 240 hour(s))  MRSA PCR Screening     Status: None   Collection Time: 04/30/15 12:21 AM  Result Value Ref Range Status   MRSA by PCR NEGATIVE NEGATIVE Final    Comment:        The GeneXpert MRSA Assay (FDA approved for NASAL specimens only), is one component of a comprehensive MRSA colonization surveillance program.  It is not intended to diagnose MRSA infection nor to guide or monitor treatment for MRSA infections.      Labs: Basic Metabolic Panel:  Recent Labs Lab 04/29/15 1832 04/30/15 0520  NA 140 136  K 4.3 4.9  CL 100* 95*  CO2 33* 32  GLUCOSE 92 116*  BUN 9 12  CREATININE 0.95 1.00  CALCIUM 9.9 9.4  MG  --  1.9  PHOS  --  4.4   Liver Function Tests:  Recent Labs Lab 04/29/15 1832 04/30/15 0520  AST 30 41  ALT 33 41  ALKPHOS 67 89  BILITOT 0.4 0.4  PROT 7.7 6.9  ALBUMIN 4.4 3.9    Recent Labs Lab 04/30/15 0018  LIPASE 24   No results for input(s): AMMONIA in the last 168 hours. CBC:  Recent Labs Lab 04/29/15 1832 04/30/15 0520  WBC 6.0 6.5  NEUTROABS 4.0  --   HGB 15.8 14.6  HCT 43.9 42.3  MCV 91.6 94.2  PLT 133* 136*   Cardiac Enzymes:  Recent Labs Lab 04/30/15 0018 04/30/15 0520 04/30/15 0905  TROPONINI <0.03 <0.03 <0.03   BNP: BNP (last  3 results) No results for input(s): BNP in the last 8760 hours.  ProBNP (last 3 results) No results for input(s): PROBNP in the last 8760 hours.  CBG: No results for input(s): GLUCAP in the last 168 hours.     SignedHosie Poisson  [facility] 05/02/2015, 10:12 AM

## 2015-05-01 NOTE — Care Management Note (Signed)
Case Management Note  Patient Details  Name: Jonathan Wilson MRN: [id number] Date of Birth: 1951-05-12  Subjective/Objective:  64 y/o m admitted w/htn urgency. Renal mass. IR bx. From home, lives w/brother.confirmed w/admitting-Bonnie-patient has health insurance per their verification process-[employer]BS active-01/02/15.  Informed patient of health insurance-[employer]BS-Patient was surprised & voiced understanding.  He states he will get a pcp from [facility] since he is familiar with them.  He will use a pharmacy. Patient voiced understanding w/all info provided.                 Action/Plan:d/c plan home.   Expected Discharge Date:  05/03/15               Expected Discharge Plan:  Home/Self Care  In-House Referral:     Discharge planning Services  CM Consult  Post Acute Care Choice:    Choice offered to:     DME Arranged:    DME Agency:     HH Arranged:    HH Agency:     Status of Service:  In process, will continue to follow  Medicare Important Message Given:    Date Medicare IM Given:    Medicare IM give by:    Date Additional Medicare IM Given:    Additional Medicare Important Message give by:     If discussed at [place] of Stay Meetings, dates discussed:    Additional Comments:  Dessa Phi, RN 05/01/2015, 12:45 PM

## 2015-05-02 MED ORDER — AMOXICILLIN-POT CLAVULANATE 875-125 MG PO TABS: 1.0000 | ORAL_TABLET | Freq: Two times a day (BID) | ORAL | Status: DC

## 2015-05-04 ENCOUNTER — Ambulatory Visit: Payer: BLUE CROSS/BLUE SHIELD | Attending: Internal Medicine | Admitting: Clinical

## 2015-05-04 DIAGNOSIS — Z658 Other specified problems related to psychosocial circumstances: Secondary | ICD-10-CM

## 2015-05-04 NOTE — Progress Notes (Signed)
ASSESSMENT: Pt currently experiencing psychosocial stressors. Pt needs to establish care with PCP and psychiatrist; would benefit from supportive counseling regarding psychosocial stressors.  Stage of Change: contemplative  PLAN: 1. F/U with behavioral health consultant in as needed 2. Psychiatric Medications: none 3. Behavioral recommendation(s):   -Consider [facility] [place] walk-in for [employer] -Make appointment to establish care with PCP at [facility] or [facility]  -Make financial counseling appointment at [facility] SUBJECTIVE: Pt. referred by [facility] for establish PCP care:  Pt. reports the following symptoms/concerns: Pt states that he is uncertain what he needs to do to establish PCP care, he was told by other healthcare workers that he has [employer], but says that his insurance was discontinued and that he is currently uninsured. He has previously been on antidepressant medication and was very low on Vitamin D (physician told him this was cause of psychosis). Primary concern is stress related to life/healthcare uncertainty after receiving kidney cancer diagnosis. Duration of problem: Less than one week since release from [facility] Severity: mild  OBJECTIVE: Orientation & Cognition: Oriented x3. Thought processes normal and appropriate to situation. Mood: appropriate. Affect: appropriate Appearance: appropriate Risk of harm to self or others: no known risk of harm to self or others Substance use: tobacco Assessments administered: none  Diagnosis: Psychosocial stressors CPT Code: Z65.9 -------------------------------------------- Other(s) present in the room: none  Time spent with patient in exam room: 12 minutes

## 2015-05-10 ENCOUNTER — Ambulatory Visit: Payer: BLUE CROSS/BLUE SHIELD | Attending: Internal Medicine

## 2015-05-15 ENCOUNTER — Ambulatory Visit: Payer: BLUE CROSS/BLUE SHIELD | Attending: Family Medicine

## 2015-05-24 ENCOUNTER — Ambulatory Visit: Payer: Medicaid Other | Attending: Family Medicine | Admitting: Family Medicine

## 2015-05-24 ENCOUNTER — Encounter (HOSPITAL_BASED_OUTPATIENT_CLINIC_OR_DEPARTMENT_OTHER): Payer: Federal, State, Local not specified - Other | Admitting: Clinical

## 2015-05-24 ENCOUNTER — Encounter: Payer: Self-pay | Admitting: Family Medicine

## 2015-05-24 ENCOUNTER — Ambulatory Visit: Payer: Self-pay | Admitting: Family Medicine

## 2015-05-24 VITALS — BP 169/103 | HR 75 | Temp 98.0°F | Resp 16 | Ht 76.0 in | Wt 255.0 lb

## 2015-05-24 DIAGNOSIS — M545 Low back pain, unspecified: Secondary | ICD-10-CM | POA: Insufficient documentation

## 2015-05-24 DIAGNOSIS — R6884 Jaw pain: Secondary | ICD-10-CM | POA: Diagnosis not present

## 2015-05-24 DIAGNOSIS — Z7982 Long term (current) use of aspirin: Secondary | ICD-10-CM | POA: Insufficient documentation

## 2015-05-24 DIAGNOSIS — E669 Obesity, unspecified: Secondary | ICD-10-CM | POA: Insufficient documentation

## 2015-05-24 DIAGNOSIS — Z8701 Personal history of pneumonia (recurrent): Secondary | ICD-10-CM | POA: Diagnosis not present

## 2015-05-24 DIAGNOSIS — F411 Generalized anxiety disorder: Secondary | ICD-10-CM | POA: Diagnosis not present

## 2015-05-24 DIAGNOSIS — R51 Headache: Secondary | ICD-10-CM | POA: Diagnosis not present

## 2015-05-24 DIAGNOSIS — R739 Hyperglycemia, unspecified: Secondary | ICD-10-CM | POA: Insufficient documentation

## 2015-05-24 DIAGNOSIS — N2889 Other specified disorders of kidney and ureter: Secondary | ICD-10-CM | POA: Diagnosis not present

## 2015-05-24 DIAGNOSIS — Z0000 Encounter for general adult medical examination without abnormal findings: Secondary | ICD-10-CM | POA: Insufficient documentation

## 2015-05-24 DIAGNOSIS — M1990 Unspecified osteoarthritis, unspecified site: Secondary | ICD-10-CM | POA: Insufficient documentation

## 2015-05-24 DIAGNOSIS — M109 Gout, unspecified: Secondary | ICD-10-CM | POA: Diagnosis present

## 2015-05-24 DIAGNOSIS — I10 Essential (primary) hypertension: Secondary | ICD-10-CM | POA: Diagnosis not present

## 2015-05-24 DIAGNOSIS — Z6831 Body mass index (BMI) 31.0-31.9, adult: Secondary | ICD-10-CM | POA: Insufficient documentation

## 2015-05-24 DIAGNOSIS — B1920 Unspecified viral hepatitis C without hepatic coma: Secondary | ICD-10-CM | POA: Diagnosis not present

## 2015-05-24 DIAGNOSIS — Z658 Other specified problems related to psychosocial circumstances: Secondary | ICD-10-CM

## 2015-05-24 DIAGNOSIS — G8929 Other chronic pain: Secondary | ICD-10-CM

## 2015-05-24 DIAGNOSIS — E559 Vitamin D deficiency, unspecified: Secondary | ICD-10-CM | POA: Insufficient documentation

## 2015-05-24 DIAGNOSIS — E785 Hyperlipidemia, unspecified: Secondary | ICD-10-CM | POA: Diagnosis not present

## 2015-05-24 LAB — POCT GLYCOSYLATED HEMOGLOBIN (HGB A1C): Hemoglobin A1C: 4.9

## 2015-05-24 MED ORDER — LISINOPRIL-HYDROCHLOROTHIAZIDE 20-12.5 MG PO TABS: 2.0000 | ORAL_TABLET | Freq: Every day | ORAL | Status: DC

## 2015-05-24 MED ORDER — HYDROXYZINE HCL 25 MG PO TABS: 25.0000 mg | ORAL_TABLET | Freq: Three times a day (TID) | ORAL | Status: DC | PRN

## 2015-05-24 MED ORDER — ATENOLOL 25 MG PO TABS: 100.0000 mg | ORAL_TABLET | Freq: Every day | ORAL | Status: DC

## 2015-05-24 MED ORDER — ALLOPURINOL 300 MG PO TABS: 300.0000 mg | ORAL_TABLET | Freq: Every morning | ORAL | Status: DC

## 2015-05-24 MED ORDER — ATENOLOL 100 MG PO TABS: 100.0000 mg | ORAL_TABLET | Freq: Every day | ORAL | Status: DC

## 2015-05-24 MED ORDER — TRAMADOL HCL 50 MG PO TABS: 50.0000 mg | ORAL_TABLET | Freq: Two times a day (BID) | ORAL | Status: DC | PRN

## 2015-05-24 NOTE — Progress Notes (Signed)
ASSESSMENT: Pt currently experiencing psychosocial stressors. Pt needs to f/u with PCP and [facility], as well as establish [place] med management with psychiatry; would benefit from supportive counseling regarding coping with psychosocial stressors.  Stage of Change: contemplative  PLAN: 1. F/U with behavioral health consultant in two weeks 2. Psychiatric Medications: hydroxyzine (starting today). 3. Behavioral recommendation(s):   -Go to [facility] walk-in clinic for [facility] med management -Fill out orange card transportation application and follow instructions to return -Consider going [place] for reduced fare bus ID SUBJECTIVE: Pt. referred by Dr Jarold Song for referral to psychiatry, psychosocial:  Pt. reports the following symptoms/concerns: Pt states that he has been approved for orange card, and is applying for Medicaid, his primary concern is obtaining an orange card transportation application, so that when his urology referral goes through, he will have transportation to that appointment. He has not been to [facility] yet, but is planning to go in next week, feeling an increase in anxiety in past week.  Duration of problem: At least one week Severity: moderate  OBJECTIVE: Orientation & Cognition: Oriented x3. Thought processes normal and appropriate to situation. Mood: appropriate. Affect: appropriate Appearance: appropriate Risk of harm to self or others: no known risk of harm to self or others Substance use: tobacco Assessments administered: did not complete  Diagnosis: Psychosocial stressors CPT Code: Z65.8 -------------------------------------------- Other(s) present in the room: none  Time spent with patient in exam room: 12 minutes

## 2015-05-24 NOTE — Progress Notes (Signed)
Patient's here for HTN f/up and to est. care with PCP.   Patient having pain in lower back for kidney disease and facial pain. Patient says he's having liver pain since leaving hospital. Patient rates pain at 8/10.  Pain as been constant.  Describes pain as throbbing and aching. Patient would like to checked for diabetes.  Patient doubled up on his BP meds x4 days ago of Lisinopril.  Patient requesting refill of medications.

## 2015-05-24 NOTE — Progress Notes (Signed)
Subjective:  Patient ID: Jonathan Wilson, male    DOB: 27-Mar-1951  Age: 64 y.o. MRN: [id number]  CC: Hospitalization Follow-up and Establish Care   HPI Jonathan Wilson is a 64 year old male  With past medical history of Pneumonia; Hypertension; Hyperlipidemia; Gout; Vitamin D deficiency; Hepatitis C ; Arthritis; Obesity admitted for jaw pain and headache from 04/29/15-05/02/15,. He was found to be in hypertensive crisis.   He had admitted to being off all his medications which were all restarted during hospitalization. He had a maxillofacial CT which revealed extensive dental and periodontal disease with soft tissue inflammation, no abscess and he was placed on Augmentin. He had a CT abdomen and pelvis which revealed a right renal mass concerning for renal cell carcinoma for which he was referred to urology outpatient.  Today he complains of pain in his right upper quadrant (history is positive for cholecystectomy), right flank and  facial pain. He also states his blood pressure has been running high and he has had to take a double dose of his lisinopril for the last 4 days due to blood pressures running in the 123456 systolic. He has not been to see urology yet because he states they wouldn't see him without insurance. He would like to establish care here as he was previously followed by [facility] adult an adult [facility] medicine up until 05/2014 and was also seen by psychiatry but does not go see any more because he has no insurance. He states he was on carbamazepine but had a terrible reaction to it and a psychiatrist would not place him on Xanax which he previously took.  Outpatient Prescriptions Prior to Visit  Medication Sig Dispense Refill  . albuterol (PROVENTIL HFA;VENTOLIN HFA) 108 (90 BASE) MCG/ACT inhaler Inhale 2-4 puffs into the lungs every 4 (four) hours as needed for wheezing or shortness of breath. 1 Inhaler 1  . aspirin EC 81 MG tablet Take 1 tablet (81 mg total) by mouth every  morning. 30 tablet 1  . Cholecalciferol (VITAMIN D3) 5000 UNITS TABS Take 1 tablet by mouth daily.    . Cyanocobalamin (VITAMIN B-12 PO) Take 1 tablet by mouth daily.    . Multiple Vitamin (MULTIVITAMIN WITH MINERALS) TABS tablet Take 1 tablet by mouth every morning.    [name] allopurinol (ZYLOPRIM) 300 MG tablet Take 1 tablet (300 mg total) by mouth every morning. 30 tablet 1  . atenolol (TENORMIN) 25 MG tablet Take 4 tablets (100 mg total) by mouth daily. 30 tablet 0  . lisinopril (PRINIVIL,ZESTRIL) 20 MG tablet Take 1 tablet (20 mg total) by mouth daily. 30 tablet 0  . oxyCODONE 10 MG TABS Take 1 tablet (10 mg total) by mouth every 4 (four) hours as needed for severe pain or breakthrough pain. 15 tablet 0  . amoxicillin-clavulanate (AUGMENTIN) 875-125 MG tablet Take 1 tablet by mouth 2 (two) times daily. (Patient not taking: Reported on 05/24/2015) 14 tablet 0  . carbamazepine (TEGRETOL) 200 MG tablet Take 1 tablet (200 mg total) by mouth 2 (two) times daily. (Patient not taking: Reported on 05/24/2015) 60 tablet 1  . traZODone (DESYREL) 100 MG tablet 1 qhs  aftyer 2 nights mayincrease to 2  qhs (Patient not taking: Reported on 05/24/2015) 15 tablet 0   No facility-administered medications prior to visit.    ROS Review of Systems  Constitutional: Negative for activity change and appetite change.  HENT: Negative for sinus pressure and sore throat.   Eyes: Negative for visual disturbance.  Respiratory:  Negative for cough, chest tightness and shortness of breath.   Cardiovascular: Negative for chest pain and leg swelling.  Gastrointestinal: Positive for abdominal pain. Negative for diarrhea, constipation and abdominal distention.  Endocrine: Negative.   Genitourinary: Negative for dysuria.  Musculoskeletal: Positive for back pain. Negative for myalgias and joint swelling.  Skin: Negative for rash.  Allergic/Immunologic: Negative.   Neurological: Negative for weakness, light-headedness and  numbness.  Psychiatric/Behavioral: Negative for suicidal ideas and dysphoric mood.    Objective:  BP 169/103 mmHg  Pulse 75  Temp(Src) 98 F (36.7 C) (Oral)  Resp 16  Ht 6\' 4"  (1.93 m)  Wt 255 lb (115.667 kg)  BMI 31.05 kg/m2  SpO2 96%  BP/Weight 05/24/2015 05/01/2015 [id number]  Systolic BP 123XX123 0000000 -  Diastolic BP XX123456 78 -  Wt. (Lbs) 255 - 252.65  BMI 31.05 - 29.95  Some encounter information is confidential and restricted. Go to Review Flowsheets activity to see all data.      Physical Exam  Constitutional: He is oriented to person, place, and time. He appears well-developed and well-nourished.  Cardiovascular: Normal rate, normal heart sounds and intact distal pulses.   No murmur heard. Pulmonary/Chest: Effort normal and breath sounds normal. He has no wheezes. He has no rales. He exhibits no tenderness.  Abdominal: Soft. Bowel sounds are normal. He exhibits no distension and no mass. There is tenderness (right upper quadrant tenderness).  Musculoskeletal: He exhibits tenderness (lumbar tenderness; negative bilateral straight leg raise).  Right flank tenderness  Neurological: He is alert and oriented to person, place, and time.  Psychiatric:  Anxious    Lab Results  Component Value Date   HGBA1C 4.90 05/24/2015   CMP Latest Ref Rng 04/30/2015 04/29/2015 01/02/2015  Glucose 65 - 99 mg/dL 116(H) 92 108(H)  BUN 6 - 20 mg/dL 12 9 14   Creatinine 0.61 - 1.24 mg/dL 1.00 0.95 0.98  Sodium 135 - 145 mmol/L 136 140 142  Potassium 3.5 - 5.1 mmol/L 4.9 4.3 5.2(H)  Chloride 101 - 111 mmol/L 95(L) 100(L) 105  CO2 22 - 32 mmol/L 32 33(H) 29  Calcium 8.9 - 10.3 mg/dL 9.4 9.9 9.8  Total Protein 6.5 - 8.1 g/dL 6.9 7.7 7.3  Total Bilirubin 0.3 - 1.2 mg/dL 0.4 0.4 1.1  Alkaline Phos 38 - 126 U/L 89 67 78  AST 15 - 41 U/L 41 30 56(H)  ALT 17 - 63 U/L 41 33 62   CLINICAL DATA: Right upper quadrant abdominal pain.  EXAM: CT ABDOMEN AND PELVIS WITH  CONTRAST  TECHNIQUE: Multidetector CT imaging of the abdomen and pelvis was performed using the standard protocol following bolus administration of intravenous contrast.  CONTRAST: 24mL OMNIPAQUE IOHEXOL 300 MG/ML SOLN  COMPARISON: CT of the abdomen and pelvis 01/16/2014.  FINDINGS: Lower chest: Ground-glass attenuation is asymmetrically present at the right lung base. The left lung base is clear. The heart size is normal. No significant pleural or pericardial effusion is present.  Hepatobiliary: The liver is within normal limits. The common bile duct is within normal limits following cholecystectomy.  Pancreas: Unremarkable.  Spleen: Within normal limits  Adrenals/Urinary Tract: The adrenal glands are within normal limits bilaterally. A 2.8 x 2.7 x 3.5 cm exophytic lesion at the upper pole of the right kidney demonstrates interval growth. This is high-density lesion with some peripheral enhancement compared to the previous noncontrast study. No other focal renal lesions are present. The ureters are within normal limits bilaterally. Urinary bladder is unremarkable.  Stomach/Bowel: The  stomach is moderately distended. No focal lesions are present. The duodenum and small bowel are unremarkable. Oral contrast can be seen in the distal small bowel to the level of the cecum. The appendix is visualized and normal. The remainder the colon is unremarkable.  Vascular/Lymphatic: Atherosclerotic changes are present in the abdominal aorta without aneurysm. There is marked tortuosity of the splenic artery. No significant adenopathy is present.  Reproductive: Within normal limits  Other: No free fluid is present  Musculoskeletal: A vacuum disc is present at L5-S1. Asymmetric left-sided facet changes are present at L4-5. No focal lytic or blastic lesions are present. Rightward curvature is stable.  IMPRESSION: 1. Asymmetric right basilar airspace disease concerning  for pneumonia or aspiration. 2. Exophytic 3.5 cm right renal mass demonstrates interval growth and is concerning for a renal cell carcinoma. 3. Extensive atherosclerotic change without aneurysm. 4. Cholecystectomy. 5. Degenerative changes of the lumbar spine as described. These results will be called to the ordering clinician or representative by the Radiologist Assistant, and communication documented in the PACS or zVision Dashboard.   Electronically Signed  By: San Morelle M.D.  On: 04/30/2015 16:53   Assessment & Plan:   1. GOUT No acute flares - allopurinol (ZYLOPRIM) 300 MG tablet; Take 1 tablet (300 mg total) by mouth every morning.  Dispense: 30 tablet; Refill: 2  2. Essential hypertension Uncontrolled, lisinopril switched to lisinopril/HCTZ - atenolol (TENORMIN) 25 MG tablet; Take 4 tablets (100 mg total) by mouth daily.  Dispense: 30 tablet; Refill: 2 - lisinopril-hydrochlorothiazide (ZESTORETIC) 20-12.5 MG tablet; Take 2 tablets by mouth daily.  Dispense: 60 tablet; Refill: 2  3. GAD (generalized anxiety disorder) LCSW called in to see the patient given underlying history of aggression and questionable psychotic disorder and he will need to be seen by mental health He is requesting Xanax which I have informed him I will be unable to prescribe for him as this is a short-term anxiolytic. - hydrOXYzine (ATARAX/VISTARIL) 25 MG tablet; Take 1 tablet (25 mg total) by mouth 3 (three) times daily as needed.  Dispense: 90 tablet; Refill: 0 - Ambulatory referral to Psychiatry  4. Renal mass, right I have discussed extensively with him the application process for [facility] discount as this will facilitate his referral His right flank pain could also be secondary to the renal mass - Ambulatory referral to Urology  5. Chronic left-sided low back pain without sciatica - traMADol (ULTRAM) 50 MG tablet; Take 1 tablet (50 mg total) by mouth every 12 (twelve) hours as  needed.  Dispense: 60 tablet; Refill: 0  6. Hyperglycemia We'll screen for diabetes mellitus; A1c is 4.9 - HgB A1c   Meds ordered this encounter  Medications  . allopurinol (ZYLOPRIM) 300 MG tablet    Sig: Take 1 tablet (300 mg total) by mouth every morning.    Dispense:  30 tablet    Refill:  2  . atenolol (TENORMIN) 25 MG tablet    Sig: Take 4 tablets (100 mg total) by mouth daily.    Dispense:  30 tablet    Refill:  2  . lisinopril-hydrochlorothiazide (ZESTORETIC) 20-12.5 MG tablet    Sig: Take 2 tablets by mouth daily.    Dispense:  60 tablet    Refill:  2  . hydrOXYzine (ATARAX/VISTARIL) 25 MG tablet    Sig: Take 1 tablet (25 mg total) by mouth 3 (three) times daily as needed.    Dispense:  90 tablet    Refill:  0  .  traMADol (ULTRAM) 50 MG tablet    Sig: Take 1 tablet (50 mg total) by mouth every 12 (twelve) hours as needed.    Dispense:  60 tablet    Refill:  0    Follow-up: Return in about 1 month (around 06/24/2015) for Follow-up of hypertension and  renal mass.   Arnoldo Morale MD

## 2015-05-24 NOTE — Patient Instructions (Signed)
Hypertension Hypertension, commonly called high blood pressure, is when the force of blood pumping through your arteries is too strong. Your arteries are the blood vessels that carry blood from your heart throughout your body. A blood pressure reading consists of a higher number over a lower number, such as 110/72. The higher number (systolic) is the pressure inside your arteries when your heart pumps. The lower number (diastolic) is the pressure inside your arteries when your heart relaxes. Ideally you want your blood pressure below 120/80. Hypertension forces your heart to work harder to pump blood. Your arteries may become narrow or stiff. Having untreated or uncontrolled hypertension can cause heart attack, stroke, kidney disease, and other problems. RISK FACTORS Some risk factors for high blood pressure are controllable. Others are not.  Risk factors you cannot control include:   Race. You may be at higher risk if you are African American.  Age. Risk increases with age.  Gender. Men are at higher risk than women before age 45 years. After age 65, women are at higher risk than men. Risk factors you can control include:  Not getting enough exercise or physical activity.  Being overweight.  Getting too much fat, sugar, calories, or salt in your diet.  Drinking too much alcohol. SIGNS AND SYMPTOMS Hypertension does not usually cause signs or symptoms. Extremely high blood pressure (hypertensive crisis) may cause headache, anxiety, shortness of breath, and nosebleed. DIAGNOSIS To check if you have hypertension, your health care provider will measure your blood pressure while you are seated, with your arm held at the level of your heart. It should be measured at least twice using the same arm. Certain conditions can cause a difference in blood pressure between your right and left arms. A blood pressure reading that is higher than normal on one occasion does not mean that you need treatment. If  it is not clear whether you have high blood pressure, you may be asked to return on a different day to have your blood pressure checked again. Or, you may be asked to monitor your blood pressure at home for 1 or more weeks. TREATMENT Treating high blood pressure includes making lifestyle changes and possibly taking medicine. Living a healthy lifestyle can help lower high blood pressure. You may need to change some of your habits. Lifestyle changes may include:  Following the DASH diet. This diet is high in fruits, vegetables, and whole grains. It is low in salt, red meat, and added sugars.  Keep your sodium intake below 2,300 mg per day.  Getting at least 30-45 minutes of aerobic exercise at least 4 times per week.  Losing weight if necessary.  Not smoking.  Limiting alcoholic beverages.  Learning ways to reduce stress. Your health care provider may prescribe medicine if lifestyle changes are not enough to get your blood pressure under control, and if one of the following is true:  You are 18-59 years of age and your systolic blood pressure is above 140.  You are 60 years of age or older, and your systolic blood pressure is above 150.  Your diastolic blood pressure is above 90.  You have diabetes, and your systolic blood pressure is over 140 or your diastolic blood pressure is over 90.  You have kidney disease and your blood pressure is above 140/90.  You have heart disease and your blood pressure is above 140/90. Your personal target blood pressure may vary depending on your medical conditions, your age, and other factors. HOME CARE INSTRUCTIONS    Have your blood pressure rechecked as directed by your health care provider.   Take medicines only as directed by your health care provider. Follow the directions carefully. Blood pressure medicines must be taken as prescribed. The medicine does not work as well when you skip doses. Skipping doses also puts you at risk for  problems.  Do not smoke.   Monitor your blood pressure at home as directed by your health care provider. SEEK MEDICAL CARE IF:   You think you are having a reaction to medicines taken.  You have recurrent headaches or feel dizzy.  You have swelling in your ankles.  You have trouble with your vision. SEEK IMMEDIATE MEDICAL CARE IF:  You develop a severe headache or confusion.  You have unusual weakness, numbness, or feel faint.  You have severe chest or abdominal pain.  You vomit repeatedly.  You have trouble breathing. MAKE SURE YOU:   Understand these instructions.  Will watch your condition.  Will get help right away if you are not doing well or get worse.   This information is not intended to replace advice given to you by your health care provider. Make sure you discuss any questions you have with your health care provider.   Document Released: 05/20/2005 Document Revised: 10/04/2014 Document Reviewed: 03/12/2013 Elsevier Interactive Patient Education 2016 [employer] Inc.  

## 2015-06-06 MED FILL — traMADol HCL 50 MG TABS: 50 | 30 days supply | Qty: 60 | Fill #0

## 2015-06-19 ENCOUNTER — Ambulatory Visit: Payer: Self-pay | Admitting: Family Medicine

## 2015-06-26 MED FILL — ?ALLOPURINOL 300 MG TABLETS: 300 | 30 days supply | Qty: 30 | Fill #1

## 2015-06-26 MED FILL — ?ATENOLOL 50 MG TABLET: 50 | 30 days supply | Qty: 60 | Fill #1

## 2015-06-26 MED FILL — LISINOPRIL-HCTZ 20-12.5 MG: 20-12.5 | 30 days supply | Qty: 60 | Fill #1

## 2015-06-28 ENCOUNTER — Telehealth: Payer: Self-pay | Admitting: Family Medicine

## 2015-06-28 ENCOUNTER — Ambulatory Visit: Payer: BLUE CROSS/BLUE SHIELD | Attending: Family Medicine | Admitting: Family Medicine

## 2015-06-28 ENCOUNTER — Encounter: Payer: Self-pay | Admitting: Family Medicine

## 2015-06-28 VITALS — BP 152/103 | HR 72 | Temp 98.2°F | Resp 15 | Ht 76.0 in | Wt 253.6 lb

## 2015-06-28 DIAGNOSIS — M109 Gout, unspecified: Secondary | ICD-10-CM | POA: Diagnosis not present

## 2015-06-28 DIAGNOSIS — F17200 Nicotine dependence, unspecified, uncomplicated: Secondary | ICD-10-CM

## 2015-06-28 DIAGNOSIS — I10 Essential (primary) hypertension: Secondary | ICD-10-CM

## 2015-06-28 DIAGNOSIS — F319 Bipolar disorder, unspecified: Secondary | ICD-10-CM | POA: Insufficient documentation

## 2015-06-28 DIAGNOSIS — Z79899 Other long term (current) drug therapy: Secondary | ICD-10-CM | POA: Diagnosis not present

## 2015-06-28 DIAGNOSIS — K0889 Other specified disorders of teeth and supporting structures: Secondary | ICD-10-CM | POA: Diagnosis not present

## 2015-06-28 DIAGNOSIS — F419 Anxiety disorder, unspecified: Secondary | ICD-10-CM | POA: Diagnosis not present

## 2015-06-28 DIAGNOSIS — Z7982 Long term (current) use of aspirin: Secondary | ICD-10-CM | POA: Insufficient documentation

## 2015-06-28 DIAGNOSIS — N2889 Other specified disorders of kidney and ureter: Secondary | ICD-10-CM | POA: Diagnosis not present

## 2015-06-28 DIAGNOSIS — F3175 Bipolar disorder, in partial remission, most recent episode depressed: Secondary | ICD-10-CM

## 2015-06-28 MED ORDER — AMLODIPINE BESYLATE 10 MG PO TABS
10.0000 mg | ORAL_TABLET | Freq: Every day | ORAL | Status: DC
Start: 2015-06-28 — End: 2015-07-25

## 2015-06-28 NOTE — Progress Notes (Signed)
Patient's here to follow up on his HTN and renal mass He has been out of meds for 2 days but has refills ready to pick up He has not heard from urology re appointment  [facility] for depression and anxiety

## 2015-06-28 NOTE — Progress Notes (Signed)
Subjective:  Patient ID: Jonathan Wilson, male    DOB: [birth year]  Age: 65 y.o. MRN: [id number]  CC: Hypertension   HPI Jonathan Wilson is a 65 year old male with a history of hypertension, tobacco abuse, bipolar disorder, right renal mass (suspicious for malignancy) who comes into the clinic for a follow-up of his hypertension. He has been out of his antihypertensives for the last 2 days also states that at home his blood pressures have been in the 150/100 range despite compliance with medications.  He was referred to urology for evaluation of a right renal mass and review of the chart indicates the urology practice will contact the patient but he is yet to hear from them. He does see mental health at [facility] for management of his bipolar disorder.  Compliant with allopurinol for gout and has had no recent gout flares. Continues to smoke and is working on quitting.  Complains of multiple loose teeth and some missing teeth with difficulty chewing and associated pain and would like to be referred to a dentist.  Outpatient Prescriptions Prior to Visit  Medication Sig Dispense Refill  . albuterol (PROVENTIL HFA;VENTOLIN HFA) 108 (90 BASE) MCG/ACT inhaler Inhale 2-4 puffs into the lungs every 4 (four) hours as needed for wheezing or shortness of breath. 1 Inhaler 1  . allopurinol (ZYLOPRIM) 300 MG tablet Take 1 tablet (300 mg total) by mouth every morning. 30 tablet 2  . aspirin EC 81 MG tablet Take 1 tablet (81 mg total) by mouth every morning. 30 tablet 1  . atenolol (TENORMIN) 100 MG tablet Take 1 tablet (100 mg total) by mouth daily. 30 tablet 2  . Cholecalciferol (VITAMIN D3) 5000 UNITS TABS Take 1 tablet by mouth daily.    . Cyanocobalamin (VITAMIN B-12 PO) Take 1 tablet by mouth daily.    . hydrOXYzine (ATARAX/VISTARIL) 25 MG tablet Take 1 tablet (25 mg total) by mouth 3 (three) times daily as needed. 90 tablet 0  . lisinopril-hydrochlorothiazide (ZESTORETIC) 20-12.5 MG tablet Take 2  tablets by mouth daily. 60 tablet 2  . Multiple Vitamin (MULTIVITAMIN WITH MINERALS) TABS tablet Take 1 tablet by mouth every morning.    . traMADol (ULTRAM) 50 MG tablet Take 1 tablet (50 mg total) by mouth every 12 (twelve) hours as needed. (Patient not taking: Reported on 06/28/2015) 60 tablet 0   No facility-administered medications prior to visit.    ROS Review of Systems  Constitutional: Negative for activity change and appetite change.  HENT: Positive for dental problem. Negative for sinus pressure and sore throat.   Eyes: Negative for visual disturbance.  Respiratory: Negative for cough, chest tightness and shortness of breath.   Cardiovascular: Negative for chest pain and leg swelling.  Gastrointestinal: Negative for abdominal pain, diarrhea, constipation and abdominal distention.  Endocrine: Negative.   Genitourinary: Negative for dysuria.  Musculoskeletal: Negative for myalgias and joint swelling.  Skin: Negative for rash.  Allergic/Immunologic: Negative.   Neurological: Negative for weakness, light-headedness and numbness.  Psychiatric/Behavioral: Negative for suicidal ideas and dysphoric mood.    Objective:  BP 152/103 mmHg  Pulse 72  Temp(Src) 98.2 F (36.8 C)  Resp 15  Ht 6\' 4"  (1.93 m)  Wt 253 lb 9.6 oz (115.032 kg)  BMI 30.88 kg/m2  SpO2 96%  BP/Weight 06/28/2015 05/24/2015 [id number]  Systolic BP 0000000 123XX123 0000000  Diastolic BP XX123456 XX123456 78  Wt. (Lbs) 253.6 255 -  BMI 30.88 31.05 -  Some encounter information is confidential and restricted. Go  to Review Flowsheets activity to see all data.    Lipid Panel     Component Value Date/Time   CHOL 178 04/30/2015 0520   TRIG 171* 04/30/2015 0520   HDL 30* 04/30/2015 0520   CHOLHDL 5.9 04/30/2015 0520   VLDL 34 04/30/2015 0520   LDLCALC 114* 04/30/2015 0520    CMP Latest Ref Rng 04/30/2015 04/29/2015 01/02/2015  Glucose 65 - 99 mg/dL 116(H) 92 108(H)  BUN 6 - 20 mg/dL 12 9 14   Creatinine 0.61 - 1.24 mg/dL 1.00  0.95 0.98  Sodium 135 - 145 mmol/L 136 140 142  Potassium 3.5 - 5.1 mmol/L 4.9 4.3 5.2(H)  Chloride 101 - 111 mmol/L 95(L) 100(L) 105  CO2 22 - 32 mmol/L 32 33(H) 29  Calcium 8.9 - 10.3 mg/dL 9.4 9.9 9.8  Total Protein 6.5 - 8.1 g/dL 6.9 7.7 7.3  Total Bilirubin 0.3 - 1.2 mg/dL 0.4 0.4 1.1  Alkaline Phos 38 - 126 U/L 89 67 78  AST 15 - 41 U/L 41 30 56(H)  ALT 17 - 63 U/L 41 33 62      Physical Exam  Constitutional: He is oriented to person, place, and time. He appears well-developed and well-nourished.  HENT:  Multiple missing teeth  Cardiovascular: Normal rate, normal heart sounds and intact distal pulses.   No murmur heard. Pulmonary/Chest: Effort normal and breath sounds normal. He has no wheezes. He has no rales. He exhibits no tenderness.  Abdominal: Soft. Bowel sounds are normal. He exhibits no distension and no mass. There is no tenderness.  Musculoskeletal: Normal range of motion.  Neurological: He is alert and oriented to person, place, and time.     Assessment & Plan:   1. GOUT Stable no acute phlebitis Continue allopurinol  2. Renal mass, right Urology referral pending. I will send off [place] to referral coordinator to expedite this process as his mass is suspicious for malignancy  3. Essential hypertension Uncontrolled. Amlodipine added to regimen  4. Tooth ache - Ambulatory referral to Dentistry  5. TOBACCO ABUSE Spent 3 minutes counseling on cessation and he is working on quitting  6. Bipolar disorder, in partial remission, most recent episode depressed ([place]) Continue hydroxyzine for anxiety. He he [facility] health at [facility].   Meds ordered this encounter  Medications  . amLODipine (NORVASC) 10 MG tablet    Sig: Take 1 tablet (10 mg total) by mouth daily.    Dispense:  30 tablet    Refill:  3    Follow-up: Return in about 3 weeks (around 07/19/2015) for Follow-up of hypertension and renal mass.Arnoldo Morale MD

## 2015-06-28 NOTE — Patient Instructions (Signed)

## 2015-06-28 NOTE — Telephone Encounter (Signed)
This patient has a right renal mass suspicious for possible malignancy and urology referral is still pending. He does have the [employer] discount do think you could expedite this for him? Thank you.

## 2015-07-12 ENCOUNTER — Ambulatory Visit (INDEPENDENT_AMBULATORY_CARE_PROVIDER_SITE_OTHER): Payer: Self-pay | Admitting: Urology

## 2015-07-12 DIAGNOSIS — D3001 Benign neoplasm of right kidney: Secondary | ICD-10-CM

## 2015-07-14 ENCOUNTER — Other Ambulatory Visit: Payer: Self-pay | Admitting: Urology

## 2015-07-17 ENCOUNTER — Telehealth: Payer: Self-pay | Admitting: Family Medicine

## 2015-07-17 NOTE — Telephone Encounter (Signed)
He has an appointment with me in 2 days at which time I will complete his pre-op clearance

## 2015-07-17 NOTE — Telephone Encounter (Signed)
Dr. Alyson Ingles needs surgical clearance for patient to partially remove kidney.Marland KitchenMarland KitchenMarland KitchenMarland Kitchenplease fax authorization to [place] at [phone]...Marland KitchenMarland KitchenMarland Kitchen

## 2015-07-17 NOTE — Telephone Encounter (Signed)
Routing to MD for completion.

## 2015-07-18 NOTE — Telephone Encounter (Signed)
Spoke to patient and let him know surgical clearance would be completed at his next upcoming appointment.

## 2015-07-19 ENCOUNTER — Ambulatory Visit: Payer: Self-pay | Admitting: Family Medicine

## 2015-07-25 ENCOUNTER — Ambulatory Visit: Payer: BLUE CROSS/BLUE SHIELD | Attending: Family Medicine | Admitting: Family Medicine

## 2015-07-25 ENCOUNTER — Encounter: Payer: Self-pay | Admitting: Family Medicine

## 2015-07-25 ENCOUNTER — Other Ambulatory Visit: Payer: Self-pay

## 2015-07-25 VITALS — BP 137/85 | HR 65 | Temp 98.2°F | Resp 15 | Ht 76.0 in | Wt 252.4 lb

## 2015-07-25 DIAGNOSIS — Z79899 Other long term (current) drug therapy: Secondary | ICD-10-CM | POA: Insufficient documentation

## 2015-07-25 DIAGNOSIS — F319 Bipolar disorder, unspecified: Secondary | ICD-10-CM | POA: Insufficient documentation

## 2015-07-25 DIAGNOSIS — Z7982 Long term (current) use of aspirin: Secondary | ICD-10-CM | POA: Diagnosis not present

## 2015-07-25 DIAGNOSIS — I10 Essential (primary) hypertension: Secondary | ICD-10-CM | POA: Insufficient documentation

## 2015-07-25 DIAGNOSIS — J42 Unspecified chronic bronchitis: Secondary | ICD-10-CM | POA: Diagnosis not present

## 2015-07-25 DIAGNOSIS — F3175 Bipolar disorder, in partial remission, most recent episode depressed: Secondary | ICD-10-CM

## 2015-07-25 DIAGNOSIS — F17200 Nicotine dependence, unspecified, uncomplicated: Secondary | ICD-10-CM | POA: Diagnosis not present

## 2015-07-25 DIAGNOSIS — N2889 Other specified disorders of kidney and ureter: Secondary | ICD-10-CM | POA: Insufficient documentation

## 2015-07-25 DIAGNOSIS — Z01818 Encounter for other preprocedural examination: Secondary | ICD-10-CM | POA: Insufficient documentation

## 2015-07-25 DIAGNOSIS — J449 Chronic obstructive pulmonary disease, unspecified: Secondary | ICD-10-CM | POA: Insufficient documentation

## 2015-07-25 MED ORDER — ALBUTEROL SULFATE HFA 108 (90 BASE) MCG/ACT IN AERS: 2.0000 | INHALATION_SPRAY | RESPIRATORY_TRACT | Status: DC | PRN

## 2015-07-25 MED ORDER — AMLODIPINE BESYLATE 10 MG PO TABS: 10.0000 mg | ORAL_TABLET | Freq: Every day | ORAL | Status: DC

## 2015-07-25 MED ORDER — LISINOPRIL-HYDROCHLOROTHIAZIDE 20-12.5 MG PO TABS: 2.0000 | ORAL_TABLET | Freq: Every day | ORAL | Status: DC

## 2015-07-25 MED FILL — VENTOLIN HFA 90 MCG INHALER: 108 (90 BAS | 30 days supply | Qty: 18 | Fill #0

## 2015-07-25 MED FILL — AMLODIPINE BESYLATE 10 MG T: 10 | 30 days supply | Qty: 30 | Fill #0

## 2015-07-25 MED FILL — LISINOPRIL-HCTZ 20-12.5 MG: 20-12.5 | 30 days supply | Qty: 60 | Fill #0

## 2015-07-25 NOTE — Patient Instructions (Signed)
Hypertension Hypertension, commonly called high blood pressure, is when the force of blood pumping through your arteries is too strong. Your arteries are the blood vessels that carry blood from your heart throughout your body. A blood pressure reading consists of a higher number over a lower number, such as 110/72. The higher number (systolic) is the pressure inside your arteries when your heart pumps. The lower number (diastolic) is the pressure inside your arteries when your heart relaxes. Ideally you want your blood pressure below 120/80. Hypertension forces your heart to work harder to pump blood. Your arteries may become narrow or stiff. Having untreated or uncontrolled hypertension can cause heart attack, stroke, kidney disease, and other problems. RISK FACTORS Some risk factors for high blood pressure are controllable. Others are not.  Risk factors you cannot control include:   Race. You may be at higher risk if you are African American.  Age. Risk increases with age.  Gender. Men are at higher risk than women before age 45 years. After age 65, women are at higher risk than men. Risk factors you can control include:  Not getting enough exercise or physical activity.  Being overweight.  Getting too much fat, sugar, calories, or salt in your diet.  Drinking too much alcohol. SIGNS AND SYMPTOMS Hypertension does not usually cause signs or symptoms. Extremely high blood pressure (hypertensive crisis) may cause headache, anxiety, shortness of breath, and nosebleed. DIAGNOSIS To check if you have hypertension, your health care provider will measure your blood pressure while you are seated, with your arm held at the level of your heart. It should be measured at least twice using the same arm. Certain conditions can cause a difference in blood pressure between your right and left arms. A blood pressure reading that is higher than normal on one occasion does not mean that you need treatment. If  it is not clear whether you have high blood pressure, you may be asked to return on a different day to have your blood pressure checked again. Or, you may be asked to monitor your blood pressure at home for 1 or more weeks. TREATMENT Treating high blood pressure includes making lifestyle changes and possibly taking medicine. Living a healthy lifestyle can help lower high blood pressure. You may need to change some of your habits. Lifestyle changes may include:  Following the DASH diet. This diet is high in fruits, vegetables, and whole grains. It is low in salt, red meat, and added sugars.  Keep your sodium intake below 2,300 mg per day.  Getting at least 30-45 minutes of aerobic exercise at least 4 times per week.  Losing weight if necessary.  Not smoking.  Limiting alcoholic beverages.  Learning ways to reduce stress. Your health care provider may prescribe medicine if lifestyle changes are not enough to get your blood pressure under control, and if one of the following is true:  You are 18-59 years of age and your systolic blood pressure is above 140.  You are 60 years of age or older, and your systolic blood pressure is above 150.  Your diastolic blood pressure is above 90.  You have diabetes, and your systolic blood pressure is over 140 or your diastolic blood pressure is over 90.  You have kidney disease and your blood pressure is above 140/90.  You have heart disease and your blood pressure is above 140/90. Your personal target blood pressure may vary depending on your medical conditions, your age, and other factors. HOME CARE INSTRUCTIONS    Have your blood pressure rechecked as directed by your health care provider.   Take medicines only as directed by your health care provider. Follow the directions carefully. Blood pressure medicines must be taken as prescribed. The medicine does not work as well when you skip doses. Skipping doses also puts you at risk for  problems.  Do not smoke.   Monitor your blood pressure at home as directed by your health care provider. SEEK MEDICAL CARE IF:   You think you are having a reaction to medicines taken.  You have recurrent headaches or feel dizzy.  You have swelling in your ankles.  You have trouble with your vision. SEEK IMMEDIATE MEDICAL CARE IF:  You develop a severe headache or confusion.  You have unusual weakness, numbness, or feel faint.  You have severe chest or abdominal pain.  You vomit repeatedly.  You have trouble breathing. MAKE SURE YOU:   Understand these instructions.  Will watch your condition.  Will get help right away if you are not doing well or get worse.   This information is not intended to replace advice given to you by your health care provider. Make sure you discuss any questions you have with your health care provider.   Document Released: 05/20/2005 Document Revised: 10/04/2014 Document Reviewed: 03/12/2013 Elsevier Interactive Patient Education 2016 [employer] Inc.  

## 2015-07-25 NOTE — Progress Notes (Signed)
Patient here for surgical clearance He states he has run out of HTN medications and did not take them today He actually never picked up his amlodipine or his albuterol so he has never taken them He reports he sees a Teacher, music at [employer]

## 2015-07-25 NOTE — Progress Notes (Signed)
Subjective:  Patient ID: Jonathan Wilson, male    DOB: Mar 15, 1951  Age: 65 y.o. MRN: [id number]  CC: Follow-up   HPI Jonathan Wilson is a 65 year old male with a history of Hypertension, COPD, Bipolar Disorder,Gout, Tobacco abuse, right renal mass who comes into the clinic for a pre operative clearance. He is scheduled for robotic asited laparoscopic partial nephrectomy by Dr Alyson Ingles of [facility] on 08/07/15.  His blood pressure is controlled despite his running out of his antihypertensives which he is needing a refill on. Mental health is managed at [facility] but he is not satisfied with the treatment he receives there as he was requesting Xanax which he did not get from the psychiatrist. His COPD is controlled on his requesting a refill of his albuterol.  He continues to smoke and does not have any plan of quitting soon.  Outpatient Prescriptions Prior to Visit  Medication Sig Dispense Refill  . allopurinol (ZYLOPRIM) 300 MG tablet Take 1 tablet (300 mg total) by mouth every morning. 30 tablet 2  . aspirin EC 81 MG tablet Take 1 tablet (81 mg total) by mouth every morning. 30 tablet 1  . atenolol (TENORMIN) 100 MG tablet Take 1 tablet (100 mg total) by mouth daily. 30 tablet 2  . Cholecalciferol (VITAMIN D3) 5000 UNITS TABS Take 1 tablet by mouth daily.    . Cyanocobalamin (VITAMIN B-12 PO) Take 1 tablet by mouth daily.    . Multiple Vitamin (MULTIVITAMIN WITH MINERALS) TABS tablet Take 1 tablet by mouth every morning.    [name] PARoxetine (PAXIL) 30 MG tablet Take 30 mg by mouth every morning.    . risperiDONE (RISPERDAL) 1 MG tablet Take 1 mg by mouth 2 (two) times daily.    . traZODone (DESYREL) 50 MG tablet Take 50 mg by mouth at bedtime.    [name] lisinopril-hydrochlorothiazide (ZESTORETIC) 20-12.5 MG tablet Take 2 tablets by mouth daily. 60 tablet 2  . albuterol (PROVENTIL HFA;VENTOLIN HFA) 108 (90 BASE) MCG/ACT inhaler Inhale 2-4 puffs into the lungs every 4 (four) hours as needed for  wheezing or shortness of breath. (Patient not taking: Reported on 07/20/2015) 1 Inhaler 1  . amLODipine (NORVASC) 10 MG tablet Take 1 tablet (10 mg total) by mouth daily. (Patient not taking: Reported on 07/20/2015) 30 tablet 3  . hydrOXYzine (ATARAX/VISTARIL) 25 MG tablet Take 1 tablet (25 mg total) by mouth 3 (three) times daily as needed. (Patient not taking: Reported on 07/20/2015) 90 tablet 0  . traMADol (ULTRAM) 50 MG tablet Take 1 tablet (50 mg total) by mouth every 12 (twelve) hours as needed. (Patient not taking: Reported on 06/28/2015) 60 tablet 0   No facility-administered medications prior to visit.    ROS Review of Systems  Constitutional: Negative for activity change and appetite change.  HENT: Negative for sinus pressure and sore throat.   Eyes: Negative for visual disturbance.  Respiratory: Negative for cough, chest tightness and shortness of breath.   Cardiovascular: Negative for chest pain and leg swelling.  Gastrointestinal: Negative for abdominal pain, diarrhea, constipation and abdominal distention.  Endocrine: Negative.   Genitourinary: Negative for dysuria.  Musculoskeletal: Negative for myalgias and joint swelling.  Skin: Negative for rash.  Allergic/Immunologic: Negative.   Neurological: Negative for weakness, light-headedness and numbness.  Psychiatric/Behavioral:       Complaints of anxiety    Objective:  BP 137/85 mmHg  Pulse 65  Temp(Src) 98.2 F (36.8 C)  Resp 15  Ht 6\' 4"  (  1.93 m)  Wt 252 lb 6.4 oz (114.488 kg)  BMI 30.74 kg/m2  SpO2 94%  BP/Weight 07/25/2015 06/28/2015 [id number]  Systolic BP 0000000 0000000 [id number]  Diastolic BP 85 [id number]  Wt. (Lbs) 252.4 253.6 255  BMI 30.74 30.88 31.05      Physical Exam Constitutional: normal appearing,  Eyes: PERRLA HEENT: Head is atraumatic, normal sinuses, normal oropharynx, normal appearing tonsils and palate, tympanic membrane is normal bilaterally. Neck: normal range of motion, no thyromegaly, no  JVD Cardiovascular: normal rate and rhythm, normal heart sounds, no murmurs, rub or gallop, no pedal edema Respiratory: clear to auscultation bilaterally, no wheezes, no rales, no rhonchi Abdomen: soft, not tender to palpation, normal bowel sounds, no enlarged organs Extremities: Full ROM, no tenderness in joints Skin: warm and dry, no lesions. Neurological: alert, oriented x3, cranial nerves I-XII grossly intact , normal motor strength, normal sensation. Psychological: normal mood.   Assessment & Plan:   1. Renal mass, right Scheduled for right robotic-assisted laparoscopic partial nephrectomy on 08/07/15  2. Essential hypertension Controlled - lisinopril-hydrochlorothiazide (ZESTORETIC) 20-12.5 MG tablet; Take 2 tablets by mouth daily.  Dispense: 60 tablet; Refill: 3 - amLODipine (NORVASC) 10 MG tablet; Take 1 tablet (10 mg total) by mouth daily.  Dispense: 30 tablet; Refill: 3  3. Bipolar disorder, in partial remission, most recent episode depressed ([place]) Currently on Paxil, Risperdal and trazodone which he states are not helping. PH Q9 score still elevated. Advised to keep his appointment with his psychiatrist at [facility].  4. Chronic bronchitis, unspecified chronic bronchitis type (HCC) Controlled - albuterol (PROVENTIL HFA;VENTOLIN HFA) 108 (90 Base) MCG/ACT inhaler; Inhale 2-4 puffs into the lungs every 4 (four) hours as needed for wheezing or shortness of breath.  Dispense: 1 Inhaler; Refill: 3  5. Preoperative clearance EKG reveals incomplete right bundle branch block which is unchanged from previous EKG. 2-D echo from 04/2015 is unremarkable. Optimized for moderate risk procedure; notes has been faxed to urology. Advised to stop aspirin 5 days prior to procedure but continue all other medications  6. Tobacco abuse Spent 3 minutes counseling on cessation and he is not ready to [place].   Meds ordered this encounter  Medications  . lisinopril-hydrochlorothiazide  (ZESTORETIC) 20-12.5 MG tablet    Sig: Take 2 tablets by mouth daily.    Dispense:  60 tablet    Refill:  3  . amLODipine (NORVASC) 10 MG tablet    Sig: Take 1 tablet (10 mg total) by mouth daily.    Dispense:  30 tablet    Refill:  3  . albuterol (PROVENTIL HFA;VENTOLIN HFA) 108 (90 Base) MCG/ACT inhaler    Sig: Inhale 2-4 puffs into the lungs every 4 (four) hours as needed for wheezing or shortness of breath.    Dispense:  1 Inhaler    Refill:  3    Follow-up: Return in about 3 months (around 10/22/2015) for Follow-up on hypertension.   Arnoldo Morale MD

## 2015-07-26 ENCOUNTER — Encounter: Payer: Self-pay | Admitting: Clinical

## 2015-07-26 NOTE — Progress Notes (Signed)
Depression screen [facility] 2/9 07/25/2015 06/28/2015 05/24/2015 06/27/2014 12/16/2013  Decreased Interest 3 3 0 - 0  Down, Depressed, Hopeless 3 3 0 3 0  PHQ - 2 Score 6 6 0 3 0  Altered sleeping 3 3 - 0 -  Tired, decreased energy 3 3 - 2 -  Change in appetite 3 3 - 2 -  Feeling bad or failure about yourself  3 3 - 3 -  Trouble concentrating 3 3 - 3 -  Moving slowly or fidgety/restless 3 3 - 3 -  Suicidal thoughts 3 0 - 3 -  PHQ-9 Score 27 24 - 19 -    GAD 7 : Generalized Anxiety Score 07/25/2015 06/28/2015  Nervous, Anxious, on Edge 3 3  Control/stop worrying 3 3  Worry too much - different things 3 3  Trouble relaxing 3 3  Restless 3 3  Easily annoyed or irritable 3 3  Afraid - awful might happen 3 3  Total GAD 7 Score 21 21    *07-25-15 update: PHQ9: 27/ GAD7: 18  Patient goes to [facility] for psychiatric care, per Dr. Jarold Song

## 2015-07-31 NOTE — Patient Instructions (Addendum)
Jonathan Wilson  07/31/2015   Your procedure is scheduled on: 08/07/2015    Report to [facility] Main  Entrance take [place]  elevators to 3rd floor to  [place] at    [place] AM.  Call this number if you have problems the morning of surgery [phone]   Remember: ONLY 1 PERSON MAY GO WITH YOU TO SHORT STAY TO GET  READY MORNING OF [place].  Do not eat food or drink liquids :After Midnight.     Take these medicines the morning of surgery with A SIP OF WATER: Albuterol Inhaler if needed and bring, Allopurinol, Amlodipine ( NOrvasc),                                 You may not have any metal on your body including hair pins and              piercings  Do not wear jewelry, , lotions, powders or perfumes, deodorant                         Men may shave face and neck.   Do not bring valuables to the hospital. [place].  Contacts, dentures or bridgework may not be worn into surgery.  Leave suitcase in the car. After surgery it may be brought to your room.         Special Instructions: coughing and deep breathing exercises, leg exercises               Please read over the following fact sheets you were given: _____________________________________________________________________             [facility] - Preparing for Surgery Before surgery, you can play an important role.  Because skin is not sterile, your skin needs to be as free of germs as possible.  You can reduce the number of germs on your skin by washing with CHG (chlorahexidine gluconate) soap before surgery.  CHG is an antiseptic cleaner which kills germs and bonds with the skin to continue killing germs even after washing. Please DO NOT use if you have an allergy to CHG or antibacterial soaps.  If your skin becomes reddened/irritated stop using the CHG and inform your nurse when you arrive at Short Stay. Do not shave (including legs and  underarms) for at least 48 hours prior to the first CHG shower.  You may shave your face/neck. Please follow these instructions carefully:  1.  Shower with CHG Soap the night before surgery and the  morning of Surgery.  2.  If you choose to wash your hair, wash your hair first as usual with your  normal  shampoo.  3.  After you shampoo, rinse your hair and body thoroughly to remove the  shampoo.                           4.  Use CHG as you would any other liquid soap.  You can apply chg directly  to the skin and wash                       Gently  with a scrungie or clean washcloth.  5.  Apply the CHG Soap to your body ONLY FROM THE NECK DOWN.   Do not use on face/ open                           Wound or open sores. Avoid contact with eyes, ears mouth and genitals (private parts).                       Wash face,  Genitals (private parts) with your normal soap.             6.  Wash thoroughly, paying special attention to the area where your surgery  will be performed.  7.  Thoroughly rinse your body with warm water from the neck down.  8.  DO NOT shower/wash with your normal soap after using and rinsing off  the CHG Soap.                9.  Pat yourself dry with a clean towel.            10.  Wear clean pajamas.            11.  Place clean sheets on your bed the night of your first shower and do not  sleep with pets. Day of Surgery : Do not apply any lotions/deodorants the morning of surgery.  Please wear clean clothes to the hospital/surgery center.  FAILURE TO FOLLOW THESE INSTRUCTIONS MAY RESULT IN THE CANCELLATION OF YOUR SURGERY PATIENT SIGNATURE_________________________________  NURSE SIGNATURE__________________________________  ________________________________________________________________________  WHAT IS A BLOOD TRANSFUSION? Blood Transfusion Information  A transfusion is the replacement of blood or some of its parts. Blood is made up of multiple cells which provide different  functions.  Red blood cells carry oxygen and are used for blood loss replacement.  White blood cells fight against infection.  Platelets control bleeding.  Plasma helps clot blood.  Other blood products are available for specialized needs, such as hemophilia or other clotting disorders. BEFORE THE TRANSFUSION  Who gives blood for transfusions?   Healthy volunteers who are fully evaluated to make sure their blood is safe. This is blood bank blood. Transfusion therapy is the safest it has ever been in the practice of medicine. Before blood is taken from a donor, a complete history is taken to make sure that person has no history of diseases nor engages in risky social behavior (examples are intravenous drug use or sexual activity with multiple partners). The donor's travel history is screened to minimize risk of transmitting infections, such as malaria. The donated blood is tested for signs of infectious diseases, such as HIV and hepatitis. The blood is then tested to be sure it is compatible with you in order to minimize the chance of a transfusion reaction. If you or a relative donates blood, this is often done in anticipation of surgery and is not appropriate for emergency situations. It takes many days to process the donated blood. RISKS AND COMPLICATIONS Although transfusion therapy is very safe and saves many lives, the main dangers of transfusion include:   Getting an infectious disease.  Developing a transfusion reaction. This is an allergic reaction to something in the blood you were given. Every precaution is taken to prevent this. The decision to have a blood transfusion has been considered carefully by your caregiver before blood is given. Blood is not given unless the benefits outweigh the  risks. AFTER THE TRANSFUSION  Right after receiving a blood transfusion, you will usually feel much better and more energetic. This is especially true if your red blood cells have gotten low  (anemic). The transfusion raises the level of the red blood cells which carry oxygen, and this usually causes an energy increase.  The nurse administering the transfusion will monitor you carefully for complications. HOME CARE INSTRUCTIONS  No special instructions are needed after a transfusion. You may find your energy is better. Speak with your caregiver about any limitations on activity for underlying diseases you may have. SEEK MEDICAL CARE IF:   Your condition is not improving after your transfusion.  You develop redness or irritation at the intravenous (IV) site. SEEK IMMEDIATE MEDICAL CARE IF:  Any of the following symptoms occur over the next 12 hours:  Shaking chills.  You have a temperature by mouth above 102 F (38.9 C), not controlled by medicine.  Chest, back, or muscle pain.  People around you feel you are not acting correctly or are confused.  Shortness of breath or difficulty breathing.  Dizziness and fainting.  You get a rash or develop hives.  You have a decrease in urine output.  Your urine turns a dark color or changes to pink, red, or brown. Any of the following symptoms occur over the next 10 days:  You have a temperature by mouth above 102 F (38.9 C), not controlled by medicine.  Shortness of breath.  Weakness after normal activity.  The white part of the eye turns yellow (jaundice).  You have a decrease in the amount of urine or are urinating less often.  Your urine turns a dark color or changes to pink, red, or brown. Document Released: 05/17/2000 Document Revised: 08/12/2011 Document Reviewed: 01/04/2008 [facility] Patient Information 2014 [place], [place].  _______________________________________________________________________

## 2015-08-01 ENCOUNTER — Encounter (HOSPITAL_COMMUNITY)
Admission: RE | Admit: 2015-08-01 | Discharge: 2015-08-01 | Disposition: A | Discharge status: Home / Self Care | Payer: Medicaid Other | Source: Ambulatory Visit | Attending: Urology | Admitting: Urology

## 2015-08-01 ENCOUNTER — Encounter (HOSPITAL_COMMUNITY): Payer: Self-pay

## 2015-08-01 DIAGNOSIS — Z0183 Encounter for blood typing: Secondary | ICD-10-CM | POA: Insufficient documentation

## 2015-08-01 DIAGNOSIS — Z01812 Encounter for preprocedural laboratory examination: Secondary | ICD-10-CM | POA: Diagnosis present

## 2015-08-01 DIAGNOSIS — N2889 Other specified disorders of kidney and ureter: Secondary | ICD-10-CM | POA: Insufficient documentation

## 2015-08-01 LAB — COMPREHENSIVE METABOLIC PANEL
ALT: 31 U/L (ref 17–63)
AST: 29 U/L (ref 15–41)
Albumin: 4.4 g/dL (ref 3.5–5.0)
Alkaline Phosphatase: 73 U/L (ref 38–126)
Anion gap: 10 (ref 5–15)
BILIRUBIN TOTAL: 0.3 mg/dL (ref 0.3–1.2)
BUN: 15 mg/dL (ref 6–20)
CO2: 27 mmol/L (ref 22–32)
Calcium: 9.5 mg/dL (ref 8.9–10.3)
Chloride: 101 mmol/L (ref 101–111)
Creatinine, Ser: 1.06 mg/dL (ref 0.61–1.24)
GFR calc Af Amer: >60 mL/min (ref >60)
Glucose, Bld: 109 mg/dL — ABNORMAL HIGH (ref 65–99)
POTASSIUM: 4.1 mmol/L (ref 3.5–5.1)
Sodium: 138 mmol/L (ref 135–145)
TOTAL PROTEIN: 7.1 g/dL (ref 6.5–8.1)

## 2015-08-01 LAB — CBC
HCT: 43.1 % (ref 39.0–52.0)
Hemoglobin: 15 g/dL (ref 13.0–17.0)
MCH: 33.7 pg (ref 26.0–34.0)
MCHC: 34.8 g/dL (ref 30.0–36.0)
MCV: 96.9 fL (ref 78.0–100.0)
PLATELETS: 138 10*3/uL — AB (ref 150–400)
RBC: 4.45 MIL/uL (ref 4.22–5.81)
RDW: 14 % (ref 11.5–15.5)
WBC: 4.9 10*3/uL (ref 4.0–10.5)

## 2015-08-01 LAB — ABO/RH: ABO/RH(D): A POS

## 2015-08-01 NOTE — Progress Notes (Signed)
EKG-07/25/15- [facility] , 04/30/15- [place]  2V CXR- 08/30/14- [facility]  05/24/15- [facility]- [place]  07/25/15- LOV with PCP- [facility]

## 2015-08-01 NOTE — Progress Notes (Signed)
Other Blood pressure readings taken at preop appt - Right arm- 116/69 and 107/65.

## 2015-08-01 NOTE — Final Progress Note (Signed)
Patient states he has developed new rash on arms in past few days.  Patient instructed to report to PCP for followup.

## 2015-08-02 NOTE — Progress Notes (Signed)
Instructed patient to take Paxil and Risperdal am of surgery since those medications he takes in the am .  Patient voiced understanding.  Also patient stated on phone call that he is to see Dr Jarold Song at 1100am on 08/03/2015 for rash.  I told patient I would call and request note after his appointment on 08/03/15.

## 2015-08-03 ENCOUNTER — Encounter: Payer: Self-pay | Admitting: Family Medicine

## 2015-08-03 ENCOUNTER — Ambulatory Visit: Payer: BLUE CROSS/BLUE SHIELD | Attending: Family Medicine | Admitting: Family Medicine

## 2015-08-03 VITALS — BP 145/94 | HR 97 | Temp 97.7°F | Resp 15 | Ht 76.0 in | Wt 252.8 lb

## 2015-08-03 DIAGNOSIS — R21 Rash and other nonspecific skin eruption: Secondary | ICD-10-CM | POA: Insufficient documentation

## 2015-08-03 DIAGNOSIS — Z7982 Long term (current) use of aspirin: Secondary | ICD-10-CM | POA: Diagnosis not present

## 2015-08-03 DIAGNOSIS — Z79899 Other long term (current) drug therapy: Secondary | ICD-10-CM | POA: Diagnosis not present

## 2015-08-03 DIAGNOSIS — F319 Bipolar disorder, unspecified: Secondary | ICD-10-CM | POA: Diagnosis not present

## 2015-08-03 DIAGNOSIS — N2889 Other specified disorders of kidney and ureter: Secondary | ICD-10-CM | POA: Insufficient documentation

## 2015-08-03 DIAGNOSIS — I10 Essential (primary) hypertension: Secondary | ICD-10-CM

## 2015-08-03 MED ORDER — LABETALOL HCL 200 MG PO TABS: 200.0000 mg | ORAL_TABLET | Freq: Two times a day (BID) | ORAL | Status: DC

## 2015-08-03 MED ORDER — LABETALOL HCL 100 MG PO TABS: 100.0000 mg | ORAL_TABLET | Freq: Two times a day (BID) | ORAL | Status: DC

## 2015-08-03 NOTE — Progress Notes (Signed)
Went to pre-op visit and staff noticed his arm were red-told him to f/u here due to possibility of it being a medication reaction to amlodopine Patient still taking medication

## 2015-08-03 NOTE — Progress Notes (Signed)
Subjective:  Patient ID: Jonathan Wilson, male    DOB: December 30, 1950  Age: 65 y.o. MRN: [id number]  CC: Rash   HPI Jonathan Wilson is a 65 year old male with a history of hypertension, tobacco abuse, bipolar disorder, right renal mass (suspicious for malignancy) who comes into the clinic with complaints of an erythematous rash on his bilateral forearms and hands noticed that his preop visit.  He denies pruritus or presence of rash in other body parts and has no allergies to foods or medications. Review of his medication list indicates amlodipine was the most recent medications that was started. He denies flushing or fever. Scheduled for robotic resection of right renal mass on 08/07/15  Outpatient Prescriptions Prior to Visit  Medication Sig Dispense Refill  . albuterol (PROVENTIL HFA;VENTOLIN HFA) 108 (90 Base) MCG/ACT inhaler Inhale 2-4 puffs into the lungs every 4 (four) hours as needed for wheezing or shortness of breath. 1 Inhaler 3  . allopurinol (ZYLOPRIM) 300 MG tablet Take 1 tablet (300 mg total) by mouth every morning. 30 tablet 2  . aspirin EC 81 MG tablet Take 1 tablet (81 mg total) by mouth every morning. 30 tablet 1  . Cholecalciferol (VITAMIN D3) 5000 UNITS TABS Take 1 tablet by mouth daily.    . Cyanocobalamin (VITAMIN B-12 PO) Take 1 tablet by mouth daily.    [name] lisinopril-hydrochlorothiazide (ZESTORETIC) 20-12.5 MG tablet Take 2 tablets by mouth daily. 60 tablet 3  . Multiple Vitamin (MULTIVITAMIN WITH MINERALS) TABS tablet Take 1 tablet by mouth every morning.    [name] PARoxetine (PAXIL) 30 MG tablet Take 30 mg by mouth every morning.    . risperiDONE (RISPERDAL) 1 MG tablet Take 1 mg by mouth 2 (two) times daily.    . traZODone (DESYREL) 50 MG tablet Take 50 mg by mouth at bedtime.    [name] amLODipine (NORVASC) 10 MG tablet Take 1 tablet (10 mg total) by mouth daily. 30 tablet 3  . atenolol (TENORMIN) 100 MG tablet Take 1 tablet (100 mg total) by mouth daily. (Patient not taking:  Reported on 08/03/2015) 30 tablet 2   No facility-administered medications prior to visit.    ROS Review of Systems  Constitutional: Negative for activity change and appetite change.  HENT: Negative for sinus pressure and sore throat.   Respiratory: Negative for chest tightness, shortness of breath and wheezing.   Cardiovascular: Negative for chest pain and palpitations.  Gastrointestinal: Negative for abdominal pain, constipation and abdominal distention.  Genitourinary: Negative.   Musculoskeletal: Negative.   Skin:       See history of present illness  Psychiatric/Behavioral: Negative for behavioral problems and dysphoric mood.    Objective:  BP 145/94 mmHg  Pulse 97  Temp(Src) 97.7 F (36.5 C)  Resp 15  Ht 6\' 4"  (1.93 m)  Wt 252 lb 12.8 oz (114.669 kg)  BMI 30.78 kg/m2  SpO2 95%  BP/Weight 08/03/2015 08/01/2015 [id number]  Systolic BP Q000111Q [id number] 0000000  Diastolic BP 94 73 85  Wt. (Lbs) 252.8 251 252.4  BMI 30.78 31.37 30.74      Physical Exam  Constitutional: He is oriented to person, place, and time. He appears well-developed and well-nourished.  Cardiovascular: Normal rate, normal heart sounds and intact distal pulses.   No murmur heard. Pulmonary/Chest: Effort normal and breath sounds normal. He has no wheezes. He has no rales. He exhibits no tenderness.  Abdominal: Soft. Bowel sounds are normal. He exhibits no distension and no mass. There is no tenderness.  Musculoskeletal: Normal range of motion.  Neurological: He is alert and oriented to person, place, and time.  Skin:  Erythematous rash on dorsum of hand and extensile aspect of forearms bilaterally No rash on other body parts     Assessment & Plan:  1. Skin rash Unknown etiology. Cannot exclude skin manifestations of renal mass in the event that it is malignant. We'll switch antihypertensives meanwhile in the event that that is the underlying etiology  2. Essential hypertension Switch from amlodipine to  labetalol - labetalol (NORMODYNE) 200 MG tablet; Take 1 tablet (200 mg total) by mouth 2 (two) times daily.  Dispense: 60 tablet; Refill: 2  3. Renal mass, right Scheduled for surgery on 08/07/15   Meds ordered this encounter  Medications  . DISCONTD: labetalol (NORMODYNE) 100 MG tablet    Sig: Take 1 tablet (100 mg total) by mouth 2 (two) times daily.    Dispense:  60 tablet    Refill:  2    Discontinue amlodipine  . labetalol (NORMODYNE) 200 MG tablet    Sig: Take 1 tablet (200 mg total) by mouth 2 (two) times daily.    Dispense:  60 tablet    Refill:  2    Discontinue previous dose, discontinue amlodipine    Follow-up: One month follow-up on hypertension  This note has been created with Surveyor, quantity. Any transcriptional errors are unintentional.     Arnoldo Morale MD

## 2015-08-04 NOTE — Progress Notes (Signed)
Per office visit note in [facility] of [facility] on 08/03/2015 Dr Jarold Song - Dr Jarold Song stopped Amlodipine ( NORvasc) and switched to Labetalol.  Due to rash .  Called patient at home on 08/04/2015 and patient stated that he had not picked up new blood pressure medication prescription yet and was not going to prior to surgery.

## 2015-08-07 ENCOUNTER — Inpatient Hospital Stay (HOSPITAL_COMMUNITY): Payer: Medicaid Other | Admitting: Certified Registered"

## 2015-08-07 ENCOUNTER — Encounter (HOSPITAL_COMMUNITY)
Admission: RE | Disposition: A | Discharge status: Home / Self Care | Payer: Self-pay | Source: Ambulatory Visit | Attending: Urology

## 2015-08-07 ENCOUNTER — Encounter (HOSPITAL_COMMUNITY): Payer: Self-pay | Admitting: *Deleted

## 2015-08-07 ENCOUNTER — Inpatient Hospital Stay (HOSPITAL_COMMUNITY)
Admission: RE | Admit: 2015-08-07 | Discharge: 2015-08-10 | DRG: 658 | Disposition: A | Discharge status: Home / Self Care | Payer: Medicaid Other | Source: Ambulatory Visit | Attending: Urology | Admitting: Urology

## 2015-08-07 DIAGNOSIS — E559 Vitamin D deficiency, unspecified: Secondary | ICD-10-CM | POA: Diagnosis present

## 2015-08-07 DIAGNOSIS — M109 Gout, unspecified: Secondary | ICD-10-CM | POA: Diagnosis present

## 2015-08-07 DIAGNOSIS — F17210 Nicotine dependence, cigarettes, uncomplicated: Secondary | ICD-10-CM | POA: Diagnosis present

## 2015-08-07 DIAGNOSIS — Z01812 Encounter for preprocedural laboratory examination: Secondary | ICD-10-CM

## 2015-08-07 DIAGNOSIS — Z8261 Family history of arthritis: Secondary | ICD-10-CM

## 2015-08-07 DIAGNOSIS — F419 Anxiety disorder, unspecified: Secondary | ICD-10-CM | POA: Diagnosis present

## 2015-08-07 DIAGNOSIS — Z7982 Long term (current) use of aspirin: Secondary | ICD-10-CM

## 2015-08-07 DIAGNOSIS — Z800 Family history of malignant neoplasm of digestive organs: Secondary | ICD-10-CM

## 2015-08-07 DIAGNOSIS — I10 Essential (primary) hypertension: Secondary | ICD-10-CM | POA: Diagnosis present

## 2015-08-07 DIAGNOSIS — Z79899 Other long term (current) drug therapy: Secondary | ICD-10-CM

## 2015-08-07 DIAGNOSIS — F329 Major depressive disorder, single episode, unspecified: Secondary | ICD-10-CM | POA: Diagnosis present

## 2015-08-07 DIAGNOSIS — N2889 Other specified disorders of kidney and ureter: Secondary | ICD-10-CM | POA: Diagnosis present

## 2015-08-07 DIAGNOSIS — Z85528 Personal history of other malignant neoplasm of kidney: Secondary | ICD-10-CM | POA: Diagnosis present

## 2015-08-07 DIAGNOSIS — E785 Hyperlipidemia, unspecified: Secondary | ICD-10-CM | POA: Diagnosis present

## 2015-08-07 DIAGNOSIS — Z833 Family history of diabetes mellitus: Secondary | ICD-10-CM | POA: Diagnosis not present

## 2015-08-07 DIAGNOSIS — C641 Malignant neoplasm of right kidney, except renal pelvis: Secondary | ICD-10-CM | POA: Diagnosis present

## 2015-08-07 HISTORY — PX: ROBOTIC ASSITED PARTIAL NEPHRECTOMY: SHX6087

## 2015-08-07 LAB — TYPE AND SCREEN
ABO/RH(D): A POS
Antibody Screen: NEGATIVE

## 2015-08-07 LAB — BASIC METABOLIC PANEL
ANION GAP: 11 (ref 5–15)
BUN: 23 mg/dL — AB (ref 6–20)
CALCIUM: 9.1 mg/dL (ref 8.9–10.3)
CO2: 25 mmol/L (ref 22–32)
Chloride: 100 mmol/L — ABNORMAL LOW (ref 101–111)
Creatinine, Ser: 1.69 mg/dL — ABNORMAL HIGH (ref 0.61–1.24)
GFR calc Af Amer: 48 mL/min — ABNORMAL LOW (ref >60)
GFR, EST NON AFRICAN AMERICAN: 41 mL/min — AB (ref >60)
GLUCOSE: 135 mg/dL — AB (ref 65–99)
Potassium: 4.7 mmol/L (ref 3.5–5.1)
SODIUM: 136 mmol/L (ref 135–145)

## 2015-08-07 LAB — CBC
HCT: 44 % (ref 39.0–52.0)
Hemoglobin: 14.8 g/dL (ref 13.0–17.0)
MCH: 33.7 pg (ref 26.0–34.0)
MCHC: 33.6 g/dL (ref 30.0–36.0)
MCV: 100.2 fL — AB (ref 78.0–100.0)
PLATELETS: 169 10*3/uL (ref 150–400)
RBC: 4.39 MIL/uL (ref 4.22–5.81)
RDW: 13.9 % (ref 11.5–15.5)
WBC: 11.1 10*3/uL — AB (ref 4.0–10.5)

## 2015-08-07 SURGERY — NEPHRECTOMY, PARTIAL, ROBOT-ASSISTED
Anesthesia: General | Laterality: Right

## 2015-08-07 MED ORDER — PROPOFOL 10 MG/ML IV BOLUS
INTRAVENOUS | Status: AC
  Filled 2015-08-07: qty 40

## 2015-08-07 MED ORDER — ONDANSETRON HCL 4 MG/2ML IJ SOLN
INTRAMUSCULAR | Status: DC | PRN
  Administered 2015-08-07 (×2): 2 mg via INTRAVENOUS

## 2015-08-07 MED ORDER — PHENYLEPHRINE 40 MCG/ML (10ML) SYRINGE FOR IV PUSH (FOR BLOOD PRESSURE SUPPORT)
PREFILLED_SYRINGE | INTRAVENOUS | Status: AC
  Filled 2015-08-07: qty 10

## 2015-08-07 MED ORDER — MIDAZOLAM HCL 2 MG/2ML IJ SOLN
INTRAMUSCULAR | Status: AC
  Filled 2015-08-07: qty 2

## 2015-08-07 MED ORDER — LACTATED RINGERS IV SOLN: INTRAVENOUS | Status: DC

## 2015-08-07 MED ORDER — SUGAMMADEX SODIUM 500 MG/5ML IV SOLN
INTRAVENOUS | Status: AC
  Filled 2015-08-07: qty 5

## 2015-08-07 MED ORDER — DIPHENHYDRAMINE HCL 12.5 MG/5ML PO ELIX: 12.5000 mg | ORAL_SOLUTION | Freq: Four times a day (QID) | ORAL | Status: DC | PRN

## 2015-08-07 MED ORDER — LIDOCAINE HCL (CARDIAC) 20 MG/ML IV SOLN
INTRAVENOUS | Status: DC | PRN
  Administered 2015-08-07: 100 mg via INTRAVENOUS

## 2015-08-07 MED ORDER — FENTANYL CITRATE (PF) 100 MCG/2ML IJ SOLN
25.0000 ug | INTRAMUSCULAR | Status: DC | PRN
  Administered 2015-08-07 (×2): 25 ug via INTRAVENOUS

## 2015-08-07 MED ORDER — CETYLPYRIDINIUM CHLORIDE 0.05 % MT LIQD
7.0000 mL | Freq: Two times a day (BID) | OROMUCOSAL | Status: DC
  Administered 2015-08-07 – 2015-08-10 (×6): 7 mL via OROMUCOSAL

## 2015-08-07 MED ORDER — LACTATED RINGERS IV SOLN
INTRAVENOUS | Status: DC | PRN
  Administered 2015-08-07 (×3): via INTRAVENOUS

## 2015-08-07 MED ORDER — LABETALOL HCL 100 MG PO TABS
200.0000 mg | ORAL_TABLET | Freq: Two times a day (BID) | ORAL | Status: DC
  Administered 2015-08-07 – 2015-08-10 (×5): 200 mg via ORAL
  Filled 2015-08-07 (×5): qty 2

## 2015-08-07 MED ORDER — DIPHENHYDRAMINE HCL 50 MG/ML IJ SOLN: 12.5000 mg | Freq: Four times a day (QID) | INTRAMUSCULAR | Status: DC | PRN

## 2015-08-07 MED ORDER — DEXAMETHASONE SODIUM PHOSPHATE 10 MG/ML IJ SOLN
INTRAMUSCULAR | Status: AC
  Filled 2015-08-07: qty 1

## 2015-08-07 MED ORDER — ACETAMINOPHEN 325 MG PO TABS: 650.0000 mg | ORAL_TABLET | ORAL | Status: DC | PRN

## 2015-08-07 MED ORDER — HYDROMORPHONE HCL 1 MG/ML IJ SOLN
0.5000 mg | INTRAMUSCULAR | Status: DC | PRN
  Administered 2015-08-07 – 2015-08-08 (×5): 1 mg via INTRAVENOUS
  Administered 2015-08-09 (×4): 0.5 mg via INTRAVENOUS
  Filled 2015-08-07 (×8): qty 1

## 2015-08-07 MED ORDER — FENTANYL CITRATE (PF) 100 MCG/2ML IJ SOLN
INTRAMUSCULAR | Status: AC
  Filled 2015-08-07: qty 2

## 2015-08-07 MED ORDER — HYDROMORPHONE HCL 1 MG/ML IJ SOLN
INTRAMUSCULAR | Status: AC
  Filled 2015-08-07: qty 1

## 2015-08-07 MED ORDER — SUGAMMADEX SODIUM 500 MG/5ML IV SOLN
INTRAVENOUS | Status: DC | PRN
  Administered 2015-08-07: 400 mg via INTRAVENOUS

## 2015-08-07 MED ORDER — ONDANSETRON HCL 4 MG/2ML IJ SOLN
INTRAMUSCULAR | Status: AC
  Filled 2015-08-07: qty 4

## 2015-08-07 MED ORDER — TRAZODONE HCL 50 MG PO TABS
50.0000 mg | ORAL_TABLET | Freq: Every day | ORAL | Status: DC
  Administered 2015-08-07 – 2015-08-09 (×3): 50 mg via ORAL
  Filled 2015-08-07 (×3): qty 1

## 2015-08-07 MED ORDER — OXYCODONE-ACETAMINOPHEN 5-325 MG PO TABS
1.0000 | ORAL_TABLET | ORAL | Status: DC | PRN
  Administered 2015-08-08 – 2015-08-09 (×8): 2 via ORAL
  Administered 2015-08-09: 1 via ORAL
  Administered 2015-08-10 (×2): 2 via ORAL
  Filled 2015-08-07 (×11): qty 2

## 2015-08-07 MED ORDER — LIDOCAINE HCL (CARDIAC) 20 MG/ML IV SOLN
INTRAVENOUS | Status: AC
  Filled 2015-08-07: qty 5

## 2015-08-07 MED ORDER — ROCURONIUM BROMIDE 100 MG/10ML IV SOLN
INTRAVENOUS | Status: AC
  Filled 2015-08-07: qty 1

## 2015-08-07 MED ORDER — MANNITOL 25 % IV SOLN
INTRAVENOUS | Status: AC
  Filled 2015-08-07: qty 100

## 2015-08-07 MED ORDER — STERILE WATER FOR IRRIGATION IR SOLN
Status: DC | PRN
  Administered 2015-08-07: 1000 mL

## 2015-08-07 MED ORDER — ATENOLOL 50 MG PO TABS
100.0000 mg | ORAL_TABLET | Freq: Every day | ORAL | Status: DC
  Administered 2015-08-08 – 2015-08-10 (×2): 100 mg via ORAL
  Filled 2015-08-07 (×2): qty 2

## 2015-08-07 MED ORDER — DEXAMETHASONE SODIUM PHOSPHATE 10 MG/ML IJ SOLN
INTRAMUSCULAR | Status: DC | PRN
  Administered 2015-08-07: 5 mg via INTRAVENOUS

## 2015-08-07 MED ORDER — EPHEDRINE SULFATE 50 MG/ML IJ SOLN
INTRAMUSCULAR | Status: AC
  Filled 2015-08-07: qty 1

## 2015-08-07 MED ORDER — MIDAZOLAM HCL 5 MG/5ML IJ SOLN
INTRAMUSCULAR | Status: DC | PRN
  Administered 2015-08-07: 2 mg via INTRAVENOUS

## 2015-08-07 MED ORDER — ONDANSETRON HCL 4 MG/2ML IJ SOLN: 4.0000 mg | INTRAMUSCULAR | Status: DC | PRN

## 2015-08-07 MED ORDER — BUPIVACAINE LIPOSOME 1.3 % IJ SUSP
20.0000 mL | Freq: Once | INTRAMUSCULAR | Status: AC
  Administered 2015-08-07: 20 mL
  Filled 2015-08-07: qty 20

## 2015-08-07 MED ORDER — SODIUM CHLORIDE 0.9 % IJ SOLN
INTRAMUSCULAR | Status: AC
  Filled 2015-08-07: qty 10

## 2015-08-07 MED ORDER — BELLADONNA ALKALOIDS-OPIUM 16.2-60 MG RE SUPP: 1.0000 | Freq: Four times a day (QID) | RECTAL | Status: DC | PRN

## 2015-08-07 MED ORDER — CEFAZOLIN SODIUM 1-5 GM-% IV SOLN
1.0000 g | Freq: Three times a day (TID) | INTRAVENOUS | Status: AC
  Administered 2015-08-07 – 2015-08-08 (×2): 1 g via INTRAVENOUS
  Filled 2015-08-07 (×2): qty 50

## 2015-08-07 MED ORDER — ALBUTEROL SULFATE (2.5 MG/3ML) 0.083% IN NEBU: 3.0000 mL | INHALATION_SOLUTION | RESPIRATORY_TRACT | Status: DC | PRN

## 2015-08-07 MED ORDER — LACTATED RINGERS IR SOLN
Status: DC | PRN
  Administered 2015-08-07: 1000 mL

## 2015-08-07 MED ORDER — EPHEDRINE SULFATE 50 MG/ML IJ SOLN
INTRAMUSCULAR | Status: DC | PRN
  Administered 2015-08-07: 15 mg via INTRAVENOUS
  Administered 2015-08-07 (×2): 10 mg via INTRAVENOUS

## 2015-08-07 MED ORDER — PROMETHAZINE HCL 25 MG/ML IJ SOLN: 6.2500 mg | INTRAMUSCULAR | Status: DC | PRN

## 2015-08-07 MED ORDER — ROCURONIUM BROMIDE 100 MG/10ML IV SOLN
INTRAVENOUS | Status: DC | PRN
  Administered 2015-08-07 (×2): 20 mg via INTRAVENOUS
  Administered 2015-08-07: 70 mg via INTRAVENOUS
  Administered 2015-08-07: 40 mg via INTRAVENOUS
  Administered 2015-08-07: 30 mg via INTRAVENOUS

## 2015-08-07 MED ORDER — HYDROMORPHONE HCL 1 MG/ML IJ SOLN
0.2500 mg | INTRAMUSCULAR | Status: DC | PRN
  Administered 2015-08-07 (×2): 0.25 mg via INTRAVENOUS

## 2015-08-07 MED ORDER — SODIUM CHLORIDE 0.9 % IJ SOLN
INTRAMUSCULAR | Status: DC | PRN
  Administered 2015-08-07: 10 mL

## 2015-08-07 MED ORDER — CEFAZOLIN SODIUM-DEXTROSE 2-3 GM-% IV SOLR
INTRAVENOUS | Status: AC
  Filled 2015-08-07: qty 50

## 2015-08-07 MED ORDER — PHENYLEPHRINE HCL 10 MG/ML IJ SOLN
INTRAMUSCULAR | Status: DC | PRN
  Administered 2015-08-07: 160 ug via INTRAVENOUS
  Administered 2015-08-07 (×3): 80 ug via INTRAVENOUS
  Administered 2015-08-07 (×2): 120 ug via INTRAVENOUS
  Administered 2015-08-07 (×2): 80 ug via INTRAVENOUS
  Administered 2015-08-07: 160 ug via INTRAVENOUS

## 2015-08-07 MED ORDER — SODIUM CHLORIDE 0.9 % IV SOLN
INTRAVENOUS | Status: DC
  Administered 2015-08-07 – 2015-08-10 (×6): via INTRAVENOUS

## 2015-08-07 MED ORDER — HEMOSTATIC AGENTS (NO CHARGE) OPTIME
TOPICAL | Status: DC | PRN
  Administered 2015-08-07: 1

## 2015-08-07 MED ORDER — FENTANYL CITRATE (PF) 250 MCG/5ML IJ SOLN
INTRAMUSCULAR | Status: AC
  Filled 2015-08-07: qty 5

## 2015-08-07 MED ORDER — PAROXETINE HCL 20 MG PO TABS
30.0000 mg | ORAL_TABLET | Freq: Every morning | ORAL | Status: DC
  Administered 2015-08-08 – 2015-08-10 (×3): 30 mg via ORAL
  Filled 2015-08-07 (×3): qty 2

## 2015-08-07 MED ORDER — MEPERIDINE HCL 50 MG/ML IJ SOLN: 6.2500 mg | INTRAMUSCULAR | Status: DC | PRN

## 2015-08-07 MED ORDER — FENTANYL CITRATE (PF) 100 MCG/2ML IJ SOLN
INTRAMUSCULAR | Status: DC | PRN
  Administered 2015-08-07 (×3): 50 ug via INTRAVENOUS
  Administered 2015-08-07: 25 ug via INTRAVENOUS
  Administered 2015-08-07: 50 ug via INTRAVENOUS
  Administered 2015-08-07: 25 ug via INTRAVENOUS

## 2015-08-07 MED ORDER — MANNITOL 25 % IV SOLN
25.0000 g | Freq: Once | INTRAVENOUS | Status: DC
  Filled 2015-08-07: qty 100

## 2015-08-07 MED ORDER — CEFAZOLIN SODIUM-DEXTROSE 2-3 GM-% IV SOLR
2.0000 g | INTRAVENOUS | Status: AC
  Administered 2015-08-07 (×2): 2 g via INTRAVENOUS

## 2015-08-07 MED ORDER — ALLOPURINOL 300 MG PO TABS
300.0000 mg | ORAL_TABLET | Freq: Every morning | ORAL | Status: DC
  Administered 2015-08-08 – 2015-08-10 (×3): 300 mg via ORAL
  Filled 2015-08-07 (×3): qty 1

## 2015-08-07 MED ORDER — PROPOFOL 10 MG/ML IV BOLUS
INTRAVENOUS | Status: DC | PRN
  Administered 2015-08-07: 250 mg via INTRAVENOUS

## 2015-08-07 MED ORDER — RISPERIDONE 0.5 MG PO TABS
1.0000 mg | ORAL_TABLET | Freq: Two times a day (BID) | ORAL | Status: DC
  Administered 2015-08-07 – 2015-08-10 (×6): 1 mg via ORAL
  Filled 2015-08-07 (×6): qty 2

## 2015-08-07 MED ORDER — MANNITOL 25 % IV SOLN
INTRAVENOUS | Status: DC | PRN
  Administered 2015-08-07 (×2): 12.5 g via INTRAVENOUS

## 2015-08-07 SURGICAL SUPPLY — 60 items
APPLICATOR SURGIFLO ENDO (HEMOSTASIS) ×2 IMPLANT
CHLORAPREP W/TINT 26ML (MISCELLANEOUS) ×2 IMPLANT
CLIP LIGATING HEM O LOK PURPLE (MISCELLANEOUS) ×2 IMPLANT
CLIP LIGATING HEMO O LOK GREEN (MISCELLANEOUS) ×8 IMPLANT
CLIP SUT LAPRA TY ABSORB (SUTURE) ×2 IMPLANT
COVER TIP SHEARS 8 DVNC (MISCELLANEOUS) ×1 IMPLANT
COVER TIP SHEARS 8MM DA VINCI (MISCELLANEOUS) ×1
DECANTER SPIKE VIAL GLASS SM (MISCELLANEOUS) IMPLANT
DRAIN CHANNEL 15F RND FF 3/16 (WOUND CARE) ×2 IMPLANT
DRAPE ARM DVNC X/XI (DISPOSABLE) ×4 IMPLANT
DRAPE COLUMN DVNC XI (DISPOSABLE) ×1 IMPLANT
DRAPE DA VINCI XI ARM (DISPOSABLE) ×4
DRAPE DA VINCI XI COLUMN (DISPOSABLE) ×1
DRAPE INCISE IOBAN 66X45 STRL (DRAPES) ×2 IMPLANT
DRAPE SHEET LG 3/4 BI-LAMINATE (DRAPES) ×2 IMPLANT
DRSG TEGADERM 4X4.75 (GAUZE/BANDAGES/DRESSINGS) ×2 IMPLANT
ELECT PENCIL ROCKER SW 15FT (MISCELLANEOUS) ×2 IMPLANT
ELECT REM PT RETURN 9FT ADLT (ELECTROSURGICAL) ×2
ELECTRODE REM PT RTRN 9FT ADLT (ELECTROSURGICAL) ×1 IMPLANT
EVACUATOR SILICONE 100CC (DRAIN) ×2 IMPLANT
GAUZE SPONGE 4X4 12PLY STRL (GAUZE/BANDAGES/DRESSINGS) ×2 IMPLANT
GLOVE BIO SURGEON STRL SZ 6.5 (GLOVE) ×2 IMPLANT
GLOVE BIOGEL M 8.0 STRL (GLOVE) ×4 IMPLANT
GOWN STRL REUS W/TWL LRG LVL3 (GOWN DISPOSABLE) ×4 IMPLANT
HEMOSTAT SURGICEL 4X8 (HEMOSTASIS) IMPLANT
KIT BASIN OR (CUSTOM PROCEDURE TRAY) ×2 IMPLANT
LIQUID BAND (GAUZE/BANDAGES/DRESSINGS) ×2 IMPLANT
LOOP VESSEL MAXI BLUE (MISCELLANEOUS) ×2 IMPLANT
MARKER SKIN DUAL TIP RULER LAB (MISCELLANEOUS) ×2 IMPLANT
NEEDLE INSUFFLATION 14GA 120MM (NEEDLE) ×2 IMPLANT
NS IRRIG 1000ML POUR BTL (IV SOLUTION) IMPLANT
POSITIONER SURGICAL ARM (MISCELLANEOUS) ×4 IMPLANT
POUCH SPECIMEN RETRIEVAL 10MM (ENDOMECHANICALS) ×2 IMPLANT
RELOAD STAPLER WHITE 60MM (STAPLE) IMPLANT
SEAL CANN UNIV 5-8 DVNC XI (MISCELLANEOUS) ×4 IMPLANT
SEAL XI 5MM-8MM UNIVERSAL (MISCELLANEOUS) ×4
SET TUBE IRRIG SUCTION NO TIP (IRRIGATION / IRRIGATOR) IMPLANT
SOLUTION ELECTROLUBE (MISCELLANEOUS) ×2 IMPLANT
SPONGE LAP 4X18 X RAY DECT (DISPOSABLE) ×2 IMPLANT
STAPLE ECHEON FLEX 60 POW ENDO (STAPLE) IMPLANT
STAPLER RELOAD WHITE 60MM (STAPLE)
SURGIFLO W/THROMBIN 8M KIT (HEMOSTASIS) ×4 IMPLANT
SUT ETHILON 3 0 PS 1 (SUTURE) ×2 IMPLANT
SUT MNCRL AB 4-0 PS2 18 (SUTURE) ×4 IMPLANT
SUT VIC AB 0 CT1 27 (SUTURE) ×1
SUT VIC AB 0 CT1 27XBRD ANTBC (SUTURE) ×1 IMPLANT
SUT VIC AB 2-0 SH 27 (SUTURE) ×4
SUT VIC AB 2-0 SH 27X BRD (SUTURE) ×4 IMPLANT
SUT VLOC BARB 180 ABS3/0GR12 (SUTURE) ×4
SUTURE VLOC BRB 180 ABS3/0GR12 (SUTURE) ×2 IMPLANT
TAPE STRIPS DRAPE STRL (GAUZE/BANDAGES/DRESSINGS) ×2 IMPLANT
TOWEL OR 17X26 10 PK STRL BLUE (TOWEL DISPOSABLE) IMPLANT
TOWEL OR NON WOVEN STRL DISP B (DISPOSABLE) ×2 IMPLANT
TRAY FOLEY W/METER SILVER 14FR (SET/KITS/TRAYS/PACK) IMPLANT
TRAY FOLEY W/METER SILVER 16FR (SET/KITS/TRAYS/PACK) ×2 IMPLANT
TRAY LAPAROSCOPIC (CUSTOM PROCEDURE TRAY) ×2 IMPLANT
TROCAR BLADELESS OPT 5 100 (ENDOMECHANICALS) IMPLANT
TROCAR UNIVERSAL OPT 12M 100M (ENDOMECHANICALS) ×2 IMPLANT
TROCAR XCEL 12X100 BLDLESS (ENDOMECHANICALS) ×2 IMPLANT
WATER STERILE IRR 1500ML POUR (IV SOLUTION) IMPLANT

## 2015-08-07 NOTE — Anesthesia Procedure Notes (Signed)
Procedure Name: Intubation Date/Time: 08/07/2015 11:57 AM Performed by: Freddie Breech Pre-anesthesia Checklist: Patient identified, Emergency Drugs available, Suction available, Patient being monitored and Timeout performed Patient Re-evaluated:Patient Re-evaluated prior to inductionOxygen Delivery Method: Circle system utilized Preoxygenation: Pre-oxygenation with 100% oxygen Intubation Type: IV induction Ventilation: Mask ventilation without difficulty and Oral airway inserted - appropriate to patient size Laryngoscope Size: Mac and 4 Grade View: Grade I Tube size: 7.5 mm Number of attempts: 1 Airway Equipment and Method: Stylet and Patient positioned with wedge pillow Placement Confirmation: ETT inserted through vocal cords under direct vision,  positive ETCO2,  CO2 detector and breath sounds checked- equal and bilateral Secured at: 23 cm Tube secured with: Tape Dental Injury: Teeth and Oropharynx as per pre-operative assessment

## 2015-08-07 NOTE — Anesthesia Preprocedure Evaluation (Signed)
Anesthesia Evaluation  Patient identified by MRN, date of birth, ID band Patient awake    Reviewed: Allergy & Precautions, NPO status , Patient's Chart, lab work & pertinent test results  Airway Mallampati: II  TM Distance: >3 FB Neck ROM: Full    Dental no notable dental hx. (+) Missing, Loose, Poor Dentition   Pulmonary COPD, Current Smoker,    Pulmonary exam normal breath sounds clear to auscultation       Cardiovascular hypertension, Pt. on medications and Pt. on home beta blockers Normal cardiovascular exam Rhythm:Regular Rate:Normal     Neuro/Psych Anxiety Depression Bipolar Disorder Schizophrenia negative neurological ROS     GI/Hepatic negative GI ROS, (+) Hepatitis -, C  Endo/Other  negative endocrine ROS  Renal/GU negative Renal ROS  negative genitourinary   Musculoskeletal negative musculoskeletal ROS (+)   Abdominal   Peds negative pediatric ROS (+)  Hematology negative hematology ROS (+)   Anesthesia Other Findings   Reproductive/Obstetrics negative OB ROS                             Anesthesia Physical Anesthesia Plan  ASA: III  Anesthesia Plan: General   Post-op Pain Management:    Induction: Intravenous  Airway Management Planned: Oral ETT  Additional Equipment:   Intra-op Plan:   Post-operative Plan: Extubation in OR  Informed Consent: I have reviewed the patients History and Physical, chart, labs and discussed the procedure including the risks, benefits and alternatives for the proposed anesthesia with the patient or authorized representative who has indicated his/her understanding and acceptance.   Dental advisory given  Plan Discussed with: CRNA  Anesthesia Plan Comments:         Anesthesia Quick Evaluation

## 2015-08-07 NOTE — H&P (Signed)
Urology Admission H&P  Chief Complaint: Right renal amss  History of Present Illness: Jonathan Wilson is a 65yo here for partial nephrectomy for right upper pole exophytic renal mass. It has shown interval growth of 1cm in the past year. 1 artery and 1 vein.  He has hx of Hep C. He denies any LUTS  Past Medical History  Diagnosis Date  . Pneumonia     recurrent episodes   . Hypertension   . Hyperlipidemia   . Gout   . Vitamin D deficiency   . Hepatitis C before 09/16/1998  . Obesity     300 # in September 15, 2008, BMI 34 in 04/2013.   . Right lower lobe lung mass 05/2008    resolved on follow up CT 2010-09-16.   [name] Psychosis 09/16/1998    s/e of treatment for Hepatitis C.    . Influenza A 06/2013    acute resp failure, did not require intubation.  . Diverticulosis of colon 15-Sep-2009    seen on CT scan in 09/15/2009.   [name] Anxiety and depression 09-16-98    worse after death of wife 03/17/13  . Anxiety   . Depression    Past Surgical History  Procedure Laterality Date  . Mandible fracture surgery    . Cholecystectomy N/A 01/14/2014    Procedure: LAPAROSCOPIC CHOLECYSTECTOMY WITH INTRAOPERATIVE CHOLANGIOGRAM;  Surgeon: Shann Medal, MD;  Location: [facility];  Service: General;  Laterality: N/A;    Home Medications:  Prescriptions prior to admission  Medication Sig Dispense Refill Last Dose  . albuterol (PROVENTIL HFA;VENTOLIN HFA) 108 (90 Base) MCG/ACT inhaler Inhale 2-4 puffs into the lungs every 4 (four) hours as needed for wheezing or shortness of breath. 1 Inhaler 3 08/07/2015 at 0600  . allopurinol (ZYLOPRIM) 300 MG tablet Take 1 tablet (300 mg total) by mouth every morning. 30 tablet 2 08/05/2015  . amLODipine (NORVASC) 10 MG tablet Take 10 mg by mouth daily.   08/06/2015 at Unknown time  . atenolol (TENORMIN) 100 MG tablet Take 1 tablet (100 mg total) by mouth daily. (Patient not taking: Reported on 08/03/2015) 30 tablet 2 NOT TAKING  . Cholecalciferol (VITAMIN D3) 5000 UNITS TABS Take 1 tablet by mouth daily.   Past Week at Unknown  time  . Cyanocobalamin (VITAMIN B-12 PO) Take 1 tablet by mouth daily.   Past Week at Unknown time  . lisinopril-hydrochlorothiazide (ZESTORETIC) 20-12.5 MG tablet Take 2 tablets by mouth daily. 60 tablet 3 08/07/2015 at 0700  . Multiple Vitamin (MULTIVITAMIN WITH MINERALS) TABS tablet Take 1 tablet by mouth every morning.   Past Week at Unknown time  . PARoxetine (PAXIL) 30 MG tablet Take 30 mg by mouth every morning.   08/07/2015 at 0700  . risperiDONE (RISPERDAL) 1 MG tablet Take 1 mg by mouth 2 (two) times daily.   08/06/2015 at Unknown time  . traZODone (DESYREL) 50 MG tablet Take 50 mg by mouth at bedtime.   08/04/2015  . aspirin EC 81 MG tablet Take 1 tablet (81 mg total) by mouth every morning. 30 tablet 1 Taking  . labetalol (NORMODYNE) 200 MG tablet Take 1 tablet (200 mg total) by mouth 2 (two) times daily. 60 tablet 2 Has not started   Allergies: No Known Allergies  Family History  Problem Relation Age of Onset  . Diabetes Mother   . Arthritis Mother   . Cancer Father     pancreatic cancer   Social History:  reports that he has been smoking Cigarettes.  He has a 40 pack-year smoking history. He has never used smokeless tobacco. He reports that he uses illicit drugs (Cocaine and Heroin). He reports that he does not drink alcohol.  Review of Systems  All other systems reviewed and are negative.   Physical Exam:  Vital signs in last 24 hours: Temp:  [98 F (36.7 C)] 98 F (36.7 C) (03/06 0916) Pulse Rate:  [73] 73 (03/06 0916) Resp:  [18] 18 (03/06 0916) BP: (126)/(80) 126/80 mmHg (03/06 0916) SpO2:  [97 %] 97 % (03/06 0916) Weight:  [114.647 kg (252 lb 12 oz)] 114.647 kg (252 lb 12 oz) (03/06 0947) Physical Exam  Constitutional: He is oriented to person, place, and time. He appears well-developed and well-nourished.  HENT:  Head: Normocephalic and atraumatic.  Eyes: EOM are normal. Pupils are equal, round, and reactive to light.  Neck: Normal range of motion. No thyromegaly  present.  Cardiovascular: Normal rate and regular rhythm.   Respiratory: Effort normal. No respiratory distress.  GI: Soft. He exhibits no distension.  Musculoskeletal: Normal range of motion.  Neurological: He is alert and oriented to person, place, and time.  Skin: Skin is warm and dry.  Psychiatric: He has a normal mood and affect. His behavior is normal. Judgment and thought content normal.    Laboratory Data:  No results found for this or any previous visit (from the past 24 hour(s)). No results found for this or any previous visit (from the past 240 hour(s)). Creatinine:  Recent Labs  08/01/15 1040  CREATININE 1.06   Baseline Creatinine: 1  Impression/Assessment:  65yo with right upper pole renal mass, concerning for malignancy  Plan:  The risks/benefits/alternatives to Robot Assisted Laparoscopic right partial nephrectomy was explained to the patient and he understands and wishes to proceed with surgery  [name] 08/07/2015, 11:23 AM

## 2015-08-07 NOTE — Transfer of Care (Signed)
Immediate Anesthesia Transfer of Care Note  Patient: Jonathan Wilson  Procedure(s) Performed: Procedure(s): XI ROBOTIC ASSISTED RIGHT PARTIAL NEPHRECTOMY (Right)  Patient Location: PACU  Anesthesia Type:General  Level of Consciousness: sedated  Airway & Oxygen Therapy: Patient Spontanous Breathing and Patient connected to face mask oxygen  Post-op Assessment: Report given to RN and Post -op Vital signs reviewed and stable  Post vital signs: Reviewed and stable  Last Vitals:  Filed Vitals:   08/07/15 0916  BP: 126/80  Pulse: 73  Temp: 36.7 C  Resp: 18    Complications: No apparent anesthesia complications

## 2015-08-07 NOTE — Discharge Instructions (Signed)

## 2015-08-07 NOTE — Brief Op Note (Signed)
08/07/2015  5:15 PM  PATIENT:  Jonathan Wilson  65 y.o. male  PRE-OPERATIVE DIAGNOSIS:  RIGHT RENAL MASS  POST-OPERATIVE DIAGNOSIS:  RIGHT RENAL MASS  PROCEDURE:  Procedure(s): XI ROBOTIC ASSISTED RIGHT PARTIAL NEPHRECTOMY (Right)  SURGEON:  Surgeon(s) and Role:    * Cleon Gustin, MD - Primary  PHYSICIAN ASSISTANT:  Debbrah Alar  ASSISTANTS: none   ANESTHESIA:   general  EBL:  Total I/O In: 2500 [I.V.:2500] Out: 350 [Urine:250; Blood:100]  BLOOD ADMINISTERED:none  DRAINS: (1) Jackson-Pratt drain(s) with closed bulb suction in the RLQ and Urinary Catheter (Foley)   LOCAL MEDICATIONS USED:  OTHER exparel  SPECIMEN:  Source of Specimen:  right partial nephrectomy and overlying adipose tissue  DISPOSITION OF SPECIMEN:  PATHOLOGY  COUNTS:  YES  TOURNIQUET:  * No tourniquets in log *  DICTATION: .Note written in EPIC  PLAN OF CARE: Admit to inpatient   PATIENT DISPOSITION:  PACU - hemodynamically stable.   Delay start of Pharmacological VTE agent (>24hrs) due to surgical blood loss or risk of bleeding: yes

## 2015-08-07 NOTE — Progress Notes (Signed)
Dr. Alyson Ingles made aware of Jonathan Wilson not following his bowel prep instructions - Magcitrate not taken pre-op.  Pt did follow a clear liquid diet 1 day prior to procedure.

## 2015-08-08 ENCOUNTER — Encounter (HOSPITAL_COMMUNITY): Payer: Self-pay | Admitting: Urology

## 2015-08-08 LAB — BASIC METABOLIC PANEL
Anion gap: 9 (ref 5–15)
BUN: 30 mg/dL — ABNORMAL HIGH (ref 6–20)
CALCIUM: 8.8 mg/dL — AB (ref 8.9–10.3)
CO2: 26 mmol/L (ref 22–32)
CREATININE: 1.82 mg/dL — AB (ref 0.61–1.24)
Chloride: 100 mmol/L — ABNORMAL LOW (ref 101–111)
GFR calc non Af Amer: 38 mL/min — ABNORMAL LOW (ref >60)
GFR, EST AFRICAN AMERICAN: 44 mL/min — AB (ref >60)
GLUCOSE: 119 mg/dL — AB (ref 65–99)
Potassium: 5.6 mmol/L — ABNORMAL HIGH (ref 3.5–5.1)
Sodium: 135 mmol/L (ref 135–145)

## 2015-08-08 LAB — CBC
HEMATOCRIT: 43.1 % (ref 39.0–52.0)
HEMOGLOBIN: 14.1 g/dL (ref 13.0–17.0)
MCH: 33 pg (ref 26.0–34.0)
MCHC: 32.7 g/dL (ref 30.0–36.0)
MCV: 100.9 fL — AB (ref 78.0–100.0)
Platelets: 183 10*3/uL (ref 150–400)
RBC: 4.27 MIL/uL (ref 4.22–5.81)
RDW: 14 % (ref 11.5–15.5)
WBC: 9.9 10*3/uL (ref 4.0–10.5)

## 2015-08-08 LAB — POTASSIUM: Potassium: 5 mmol/L (ref 3.5–5.1)

## 2015-08-08 MED ORDER — SODIUM CHLORIDE 0.9 % IV BOLUS (SEPSIS)
1000.0000 mL | Freq: Once | INTRAVENOUS | Status: AC
  Administered 2015-08-08: 1000 mL via INTRAVENOUS

## 2015-08-09 LAB — BASIC METABOLIC PANEL
Anion gap: 8 (ref 5–15)
BUN: 33 mg/dL — ABNORMAL HIGH (ref 6–20)
CALCIUM: 8.4 mg/dL — AB (ref 8.9–10.3)
CO2: 25 mmol/L (ref 22–32)
CREATININE: 1.73 mg/dL — AB (ref 0.61–1.24)
Chloride: 102 mmol/L (ref 101–111)
GFR calc non Af Amer: 40 mL/min — ABNORMAL LOW (ref >60)
GFR, EST AFRICAN AMERICAN: 47 mL/min — AB (ref >60)
Glucose, Bld: 121 mg/dL — ABNORMAL HIGH (ref 65–99)
Potassium: 4.7 mmol/L (ref 3.5–5.1)
SODIUM: 135 mmol/L (ref 135–145)

## 2015-08-09 LAB — CBC
HCT: 37.9 % — ABNORMAL LOW (ref 39.0–52.0)
HEMOGLOBIN: 12.4 g/dL — AB (ref 13.0–17.0)
MCH: 33.4 pg (ref 26.0–34.0)
MCHC: 32.7 g/dL (ref 30.0–36.0)
MCV: 102.2 fL — ABNORMAL HIGH (ref 78.0–100.0)
Platelets: 122 10*3/uL — ABNORMAL LOW (ref 150–400)
RBC: 3.71 MIL/uL — ABNORMAL LOW (ref 4.22–5.81)
RDW: 13.7 % (ref 11.5–15.5)
WBC: 6.7 10*3/uL (ref 4.0–10.5)

## 2015-08-09 NOTE — Progress Notes (Signed)
Pt given dilaudid 0.5mg  this afternoon. At 1755, I gave the other 0.5mg  dilaudid from same vial. Pyxis is informing me that I need to waste 0.5mg  dilaudid when in actuality I gave the whole 1mg  30 minutes apart with 2 subsequent doses. Pharmacy notified.

## 2015-08-09 NOTE — Anesthesia Postprocedure Evaluation (Signed)
Anesthesia Post Note  Patient: Jonathan Wilson  Procedure(s) Performed: Procedure(s) (LRB): XI ROBOTIC ASSISTED RIGHT PARTIAL NEPHRECTOMY (Right)  Patient location during evaluation: PACU Anesthesia Type: General Level of consciousness: awake and alert Pain management: pain level controlled Vital Signs Assessment: post-procedure vital signs reviewed and stable Respiratory status: spontaneous breathing, nonlabored ventilation, respiratory function stable and patient connected to nasal cannula oxygen Cardiovascular status: blood pressure returned to baseline and stable Postop Assessment: no signs of nausea or vomiting Anesthetic complications: no    Last Vitals:  Filed Vitals:   08/09/15 0900 08/09/15 1422  BP: 100/67 117/72  Pulse:  61  Temp:  36.5 C  Resp:  20    Last Pain:  Filed Vitals:   08/09/15 1952  PainSc: 4                  Tiajuana Amass

## 2015-08-09 NOTE — Op Note (Signed)
Preoperative diagnosis: Right renal mass  Postop diagnosis: Same  Procedure: 1.  Right robot assisted laparoscopic radical partial nephrectomy  Attending: Nicolette Bang  Assistant: Debbrah Alar  Anesthesia: General  Estimated blood loss: 100 cc  Drains: 16 French Foley catheter, JP drain  Specimens: Right renal mass with overlying renal fat  Antibiotics: ancef  Findings: 1 artery and 1 vein. 3.5cm right posterior upper pole renal mass Warm Ischemia time: 29 minutes  Indications: Patient is a 65 year old with a history of 3.5 cm right renal mass.  The mass was amenable to partial nephrectomy.  After discussing treatment options patient decided to proceed with right robot assisted laparoscopic partial nephrectomy.  Procedure in detail: Prior to procedure consent was obtained. Patient was brought to the operating room and briefing was done sure correct patient, correct procedure, correct site.  General anesthesia was in administered patient was placed in the left lateral decubitus position.  a 82 French catheter was placed. their abdomen and flank was then prepped and draped usual sterile fashion.  A Veress needle was used to obtain pneumoperitoneum.  Once pneumoperitoneum was reestablished to 15 mmHg we then placed a 8 mm camera port lateral to the umbilicus at the latera; edge of rectus.  We then proceeded to place 3 more robotic ports. We then placed an assistant port. We then docked the robot.  We then started this dissection along the white line of Toldt.  We then reflected the colon medially.  We then kocherized the duodenum. We then identified the psoas muscle.  Once this was done we traced it down to the iliac vessels and identified the ureter.  Once we identified the gonadal vein and ureter were then traced this to the renal hilum.  The renal vein and renal artery were skeletonized.  We did we identified one renal vein one renal artery. We placed a vessel loop around the renal  artery.  We then turned our attention to locating the renal mass. We proceeded to remove the overlying renal fat until we identified the renal mass. Once this was done with the circumscribed the mass with electrocautery. We then placed a bulldog clamp on the renal artery and this began our warm ischemia time. Using sharp dissection the mass was removed. Using a 2-0 V lock suture we oversewed the tumor bed. We then used 0 Vicryl with hem-o-locks in an interrupted fashion to reapproximate the defect int he kidney. Once this was done we removed the bulldog clamp and noted no residual bleeding.  We then placed the specimen in an Endo Catch bag.  Once the specimen was in the Endo Catch bag we then inspected the left kidney and noted no residual bleeding. We then placed a JP drain in the lower quadrant robot port. THis was secured with a 0 nylon.  We then removed our instruments, undocked the robot, and released the pneumoperitoneum. Once the specimen was removed we then closed the camera and assistant ports with 0 Vicryl in interrupted fashion.  The skin was then subcuticularly closed with 4-0 Monocryl.  We then placed Dermabond over all the incisions.  This concluded the procedure which resulted by the patient.  Complications: None  Condition: Stable, extubated, transferred to PACU.  Plan: Patient is to be admitted for inpatient stay. The foley catheter will be removed in the morning. They will be started on a clear liquid diet POD#1

## 2015-08-09 NOTE — Progress Notes (Signed)
2 Days Post-Op Subjective: Patient reports good Pain control with PO meds. Positive flatus. JP drain 160cc in 24 hours. Creatinine improved to 1.73  Objective: Vital signs in last 24 hours: Temp:  [97.6 F (36.4 C)-97.8 F (36.6 C)] 97.8 F (36.6 C) (03/08 0503) Pulse Rate:  [55-91] 56 (03/08 0503) Resp:  [18-19] 18 (03/08 0503) BP: (93-102)/(51-67) 100/67 mmHg (03/08 0900) SpO2:  [94 %-97 %] 97 % (03/08 0503)  Intake/Output from previous day: 03/07 0701 - 03/08 0700 In: 4220 [P.O.:1320; I.V.:2900] Out: 1360 [Urine:1200; Drains:160] Intake/Output this shift: Total I/O In: 360 [P.O.:360] Out: 600 [Urine:600]  Physical Exam:  General:alert, cooperative and appears stated age GI:  and soft, non tender, normal bowel sounds, no palpable masses, no organomegaly, no inguinal hernia Male genitalia: not done Extremities: extremities normal, atraumatic, no cyanosis or edema  Lab Results:  Recent Labs  08/07/15 1832 08/08/15 0421 08/09/15 0821  HGB 14.8 14.1 12.4*  HCT 44.0 43.1 37.9*   BMET  Recent Labs  08/08/15 0421 08/08/15 1147 08/09/15 0821  NA 135  --  135  K 5.6* 5.0 4.7  CL 100*  --  102  CO2 26  --  25  GLUCOSE 119*  --  121*  BUN 30*  --  33*  CREATININE 1.82*  --  1.73*  CALCIUM 8.8*  --  8.4*   No results for input(s): LABPT, INR in the last 72 hours. No results for input(s): LABURIN in the last 72 hours. Results for orders placed or performed during the hospital encounter of 04/29/15  MRSA PCR Screening     Status: None   Collection Time: 04/30/15 12:21 AM  Result Value Ref Range Status   MRSA by PCR NEGATIVE NEGATIVE Final    Comment:        The GeneXpert MRSA Assay (FDA approved for NASAL specimens only), is one component of a comprehensive MRSA colonization surveillance program. It is not intended to diagnose MRSA infection nor to guide or monitor treatment for MRSA infections.     Studies/Results: No results  found.  Assessment/Plan: 64yo POD#2 from right partial nephrectomy, doing well  Plan: 1. Advance to regular diet 2. contineu ambulation in halls 3. Likely discharge tomorrow   LOS: 2 days   MCKENZIE, PATRICK L 08/09/2015, 12:57 PM

## 2015-08-10 LAB — CREATININE, FLUID (PLEURAL, PERITONEAL, JP DRAINAGE): CREAT FL: 1.2 mg/dL

## 2015-08-10 MED ORDER — OXYCODONE-ACETAMINOPHEN 5-325 MG PO TABS: 1.0000 | ORAL_TABLET | ORAL | Status: DC | PRN

## 2015-08-12 NOTE — Discharge Summary (Signed)
Physician Discharge Summary  Patient ID: Jonathan Wilson MRN: [id number] DOB/AGE: 1950-06-07 64 y.o.  Admit date: 08/07/2015 Discharge date: 08/10/2015  Admission Diagnoses: Right renal mass Discharge Diagnoses:  Active Problems:   Renal mass   Discharged Condition: good  Hospital Course: The patient tolerated the procedure well and was transferred to the floor on IV pain meds, IV fluid. On POD#1 foley was removed, pt was started on clear liquid diet and they ambulated in the halls. On POD#2 the patient was transitioned to a regular diet, IVFs were discontinued, and the patient passed flatus. Prior to discharge the pt was tolerating a regular diet, pain was controlled on PO pain meds, they were ambulating without difficulty, and they had normal bowel function.   Consults: None  Significant Diagnostic Studies: none  Treatments: surgery: Robot assisted laparoscopic right partial nephrectomy  Discharge Exam: Blood pressure 136/81, pulse 69, temperature 97.8 F (36.6 C), temperature source Oral, resp. rate 18, height 6\' 4"  (1.93 m), weight 114.647 kg (252 lb 12 oz), SpO2 93 %. General appearance: alert, cooperative and appears stated age Head: Normocephalic, without obvious abnormality, atraumatic Eyes: conjunctivae/corneas clear. PERRL, EOM's intact. Fundi benign. Neck: no adenopathy, no carotid bruit, no JVD, supple, symmetrical, trachea midline and thyroid not enlarged, symmetric, no tenderness/mass/nodules Resp: clear to auscultation bilaterally Cardio: regular rate and rhythm, S1, S2 normal, no murmur, click, rub or gallop GI: soft, non-tender; bowel sounds normal; no masses,  no organomegaly Extremities: extremities normal, atraumatic, no cyanosis or edema Neurologic: Alert and oriented X 3, normal strength and tone. Normal symmetric reflexes. Normal coordination and gait  Disposition: 01-Home or Self Care     Medication List    STOP taking these medications        aspirin  EC 81 MG tablet     atenolol 100 MG tablet  Commonly known as:  TENORMIN     multivitamin with minerals Tabs tablet     VITAMIN B-12 PO     Vitamin D3 5000 units Tabs      TAKE these medications        albuterol 108 (90 Base) MCG/ACT inhaler  Commonly known as:  PROVENTIL HFA;VENTOLIN HFA  Inhale 2-4 puffs into the lungs every 4 (four) hours as needed for wheezing or shortness of breath.     allopurinol 300 MG tablet  Commonly known as:  ZYLOPRIM  Take 1 tablet (300 mg total) by mouth every morning.     amLODipine 10 MG tablet  Commonly known as:  NORVASC  Take 10 mg by mouth daily.     labetalol 200 MG tablet  Commonly known as:  NORMODYNE  Take 1 tablet (200 mg total) by mouth 2 (two) times daily.     lisinopril-hydrochlorothiazide 20-12.5 MG tablet  Commonly known as:  ZESTORETIC  Take 2 tablets by mouth daily.     oxyCODONE-acetaminophen 5-325 MG tablet  Commonly known as:  PERCOCET/ROXICET  Take 1-2 tablets by mouth every 4 (four) hours as needed for moderate pain.     PARoxetine 30 MG tablet  Commonly known as:  PAXIL  Take 30 mg by mouth every morning.     risperiDONE 1 MG tablet  Commonly known as:  RISPERDAL  Take 1 mg by mouth 2 (two) times daily.     traZODone 50 MG tablet  Commonly known as:  DESYREL  Take 50 mg by mouth at bedtime.           Follow-up Information  Follow up with Karen Kays, NP On 08/21/2015.   Specialty:  Nurse Practitioner   Why:  at 9:00   Contact information:   [address] [place] [address] [phone]       Signed: Cleon Gustin 08/12/2015, 10:37 AM

## 2015-08-23 MED FILL — LISINOPRIL-HCTZ 20-12.5 MG: 20-12.5 | 30 days supply | Qty: 60 | Fill #1

## 2015-08-23 MED FILL — ?ALLOPURINOL 300 MG TABLETS: 300 | 30 days supply | Qty: 30 | Fill #2

## 2015-08-23 MED FILL — VENTOLIN HFA 90 MCG INHALER: 108 (90 BAS | 30 days supply | Qty: 18 | Fill #1

## 2015-08-23 MED FILL — AMLODIPINE BESYLATE 10 MG T: 10 | 30 days supply | Qty: 30 | Fill #1

## 2015-09-08 ENCOUNTER — Encounter: Payer: Self-pay | Admitting: Family Medicine

## 2015-09-08 ENCOUNTER — Ambulatory Visit: Payer: BLUE CROSS/BLUE SHIELD | Attending: Family Medicine | Admitting: Family Medicine

## 2015-09-08 ENCOUNTER — Ambulatory Visit (HOSPITAL_BASED_OUTPATIENT_CLINIC_OR_DEPARTMENT_OTHER): Payer: BLUE CROSS/BLUE SHIELD | Admitting: Clinical

## 2015-09-08 VITALS — BP 134/83 | HR 73 | Temp 97.6°F | Resp 18 | Ht 76.0 in | Wt 257.0 lb

## 2015-09-08 DIAGNOSIS — K0490 Unspecified diseases of pulp and periapical tissues: Secondary | ICD-10-CM | POA: Diagnosis not present

## 2015-09-08 DIAGNOSIS — K089 Disorder of teeth and supporting structures, unspecified: Secondary | ICD-10-CM | POA: Diagnosis not present

## 2015-09-08 DIAGNOSIS — F329 Major depressive disorder, single episode, unspecified: Secondary | ICD-10-CM | POA: Diagnosis not present

## 2015-09-08 DIAGNOSIS — J449 Chronic obstructive pulmonary disease, unspecified: Secondary | ICD-10-CM | POA: Diagnosis not present

## 2015-09-08 DIAGNOSIS — F411 Generalized anxiety disorder: Secondary | ICD-10-CM

## 2015-09-08 DIAGNOSIS — C649 Malignant neoplasm of unspecified kidney, except renal pelvis: Secondary | ICD-10-CM | POA: Diagnosis not present

## 2015-09-08 DIAGNOSIS — Z85528 Personal history of other malignant neoplasm of kidney: Secondary | ICD-10-CM

## 2015-09-08 DIAGNOSIS — Z9889 Other specified postprocedural states: Secondary | ICD-10-CM | POA: Diagnosis not present

## 2015-09-08 DIAGNOSIS — Z79899 Other long term (current) drug therapy: Secondary | ICD-10-CM | POA: Diagnosis not present

## 2015-09-08 DIAGNOSIS — I10 Essential (primary) hypertension: Secondary | ICD-10-CM

## 2015-09-08 MED ORDER — ACETAMINOPHEN-CODEINE #3 300-30 MG PO TABS: 1.0000 | ORAL_TABLET | Freq: Three times a day (TID) | ORAL | Status: DC | PRN

## 2015-09-08 NOTE — Progress Notes (Signed)
Subjective:  Patient ID: Jonathan Wilson, male    DOB: Nov 06, 1950  Age: 65 y.o. MRN: [id number]  CC: Follow-up   HPI Jonathan Wilson is a 65 year old male with history of hypertension, renal carcinoma status post right robotic-assisted partial nephrectomy on 08/07/15, COPD who comes into the clinic for follow-up of his hypertension. Pathology revealed renal cell carcinoma, clear cell type. After his surgery he had his sutures taken out by "a nurse" at [facility] but is yet to see his surgeon for follow-up and is concerned about this and has multiple questions he needs answers to. He informs me he feels better than he has ever felt in 2 years and his appetite has returned.  He complains of tooth ache and is status post multiple teeth extraction and is scheduled to have dentures placed. He is requesting a prescription for Tylenol 3.  He goes to [facility] for management of his anxiety and depression.  Outpatient Prescriptions Prior to Visit  Medication Sig Dispense Refill  . albuterol (PROVENTIL HFA;VENTOLIN HFA) 108 (90 Base) MCG/ACT inhaler Inhale 2-4 puffs into the lungs every 4 (four) hours as needed for wheezing or shortness of breath. 1 Inhaler 3  . allopurinol (ZYLOPRIM) 300 MG tablet Take 1 tablet (300 mg total) by mouth every morning. 30 tablet 2  . amLODipine (NORVASC) 10 MG tablet Take 10 mg by mouth daily.    [name] labetalol (NORMODYNE) 200 MG tablet Take 1 tablet (200 mg total) by mouth 2 (two) times daily. 60 tablet 2  . lisinopril-hydrochlorothiazide (ZESTORETIC) 20-12.5 MG tablet Take 2 tablets by mouth daily. 60 tablet 3  . PARoxetine (PAXIL) 30 MG tablet Take 30 mg by mouth every morning.    . risperiDONE (RISPERDAL) 1 MG tablet Take 1 mg by mouth 2 (two) times daily.    . traZODone (DESYREL) 50 MG tablet Take 50 mg by mouth at bedtime.    [name] oxyCODONE-acetaminophen (PERCOCET/ROXICET) 5-325 MG tablet Take 1-2 tablets by mouth every 4 (four) hours as needed for moderate pain. 30  tablet 0   No facility-administered medications prior to visit.    ROS Review of Systems  Constitutional: Negative for activity change and appetite change.  HENT: Positive for dental problem. Negative for sinus pressure and sore throat.   Eyes: Negative for visual disturbance.  Respiratory: Negative for cough, chest tightness and shortness of breath.   Cardiovascular: Negative for chest pain and leg swelling.  Gastrointestinal: Negative for abdominal pain, diarrhea, constipation and abdominal distention.  Endocrine: Negative.   Genitourinary: Negative for dysuria.  Musculoskeletal: Negative for myalgias and joint swelling.  Skin: Negative for rash.  Allergic/Immunologic: Negative.   Neurological: Negative for weakness, light-headedness and numbness.  Psychiatric/Behavioral: Negative for suicidal ideas and dysphoric mood.    Objective:  BP 134/83 mmHg  Pulse 73  Temp(Src) 97.6 F (36.4 C) (Oral)  Resp 18  Ht 6\' 4"  (1.93 m)  Wt 257 lb (116.574 kg)  BMI 31.30 kg/m2  SpO2 97%  BP/Weight 09/08/2015 08/03/2015 [id number]  Systolic BP [id number]  Diastolic BP 83 94 73  Wt. (Lbs) 257 252.8 251  BMI 31.3 30.78 31.37      Physical Exam  Constitutional: He is oriented to person, place, and time. He appears well-developed and well-nourished.  Cardiovascular: Normal rate, normal heart sounds and intact distal pulses.   No murmur heard. Pulmonary/Chest: Effort normal and breath sounds normal. He has no wheezes. He has no rales. He exhibits no tenderness.  Abdominal: Soft.  Bowel sounds are normal. He exhibits no distension and no mass. There is no tenderness.  Laparoscopic scars from surgery healing well.  Musculoskeletal: Normal range of motion.  Neurological: He is alert and oriented to person, place, and time.    CMP Latest Ref Rng 08/09/2015 08/08/2015 08/08/2015  Glucose 65 - 99 mg/dL 121(H) - 119(H)  BUN 6 - 20 mg/dL 33(H) - 30(H)  Creatinine 0.61 - 1.24 mg/dL 1.73(H) - 1.82(H)    Sodium 135 - 145 mmol/L 135 - 135  Potassium 3.5 - 5.1 mmol/L 4.7 5.0 5.6(H)  Chloride 101 - 111 mmol/L 102 - 100(L)  CO2 22 - 32 mmol/L 25 - 26  Calcium 8.9 - 10.3 mg/dL 8.4(L) - 8.8(L)  Total Protein 6.5 - 8.1 g/dL - - -  Total Bilirubin 0.3 - 1.2 mg/dL - - -  Alkaline Phos 38 - 126 U/L - - -  AST 15 - 41 U/L - - -  ALT 17 - 63 U/L - - -     Assessment & Plan:   1. Essential hypertension Controlled - COMPLETE METABOLIC PANEL WITH GFR; Future - Lipid panel; Future  2. History of renal cell cancer Status post right robotic assisted radical partial nephrectomy His next appointment is not until 3 months and he still has questions about his surgery; I have advised him to walk into the office to obtain an appointment at [facility] to speak with history surgeon.  3. Dental disease Status post multiple tooth extractions and is scheduled for placement of dentures. Patient now has Medicaid and I have advised him to notify the dentures Center at his visit. - acetaminophen-codeine (TYLENOL #3) 300-30 MG tablet; Take 1 tablet by mouth every 8 (eight) hours as needed for moderate pain.  Dispense: 30 tablet; Refill: 0   Meds ordered this encounter  Medications  . acetaminophen-codeine (TYLENOL #3) 300-30 MG tablet    Sig: Take 1 tablet by mouth every 8 (eight) hours as needed for moderate pain.    Dispense:  30 tablet    Refill:  0    Follow-up: Return in about 3 months (around 12/08/2015) for follow up on hypertension.   Arnoldo Morale MD

## 2015-09-08 NOTE — Progress Notes (Signed)
Patient complains of teeth pain scaled currently at [facility]

## 2015-09-08 NOTE — Patient Instructions (Signed)
Hypertension Hypertension, commonly called high blood pressure, is when the force of blood pumping through your arteries is too strong. Your arteries are the blood vessels that carry blood from your heart throughout your body. A blood pressure reading consists of a higher number over a lower number, such as 110/72. The higher number (systolic) is the pressure inside your arteries when your heart pumps. The lower number (diastolic) is the pressure inside your arteries when your heart relaxes. Ideally you want your blood pressure below 120/80. Hypertension forces your heart to work harder to pump blood. Your arteries may become narrow or stiff. Having untreated or uncontrolled hypertension can cause heart attack, stroke, kidney disease, and other problems. RISK FACTORS Some risk factors for high blood pressure are controllable. Others are not.  Risk factors you cannot control include:   Race. You may be at higher risk if you are African American.  Age. Risk increases with age.  Gender. Men are at higher risk than women before age 45 years. After age 65, women are at higher risk than men. Risk factors you can control include:  Not getting enough exercise or physical activity.  Being overweight.  Getting too much fat, sugar, calories, or salt in your diet.  Drinking too much alcohol. SIGNS AND SYMPTOMS Hypertension does not usually cause signs or symptoms. Extremely high blood pressure (hypertensive crisis) may cause headache, anxiety, shortness of breath, and nosebleed. DIAGNOSIS To check if you have hypertension, your health care provider will measure your blood pressure while you are seated, with your arm held at the level of your heart. It should be measured at least twice using the same arm. Certain conditions can cause a difference in blood pressure between your right and left arms. A blood pressure reading that is higher than normal on one occasion does not mean that you need treatment. If  it is not clear whether you have high blood pressure, you may be asked to return on a different day to have your blood pressure checked again. Or, you may be asked to monitor your blood pressure at home for 1 or more weeks. TREATMENT Treating high blood pressure includes making lifestyle changes and possibly taking medicine. Living a healthy lifestyle can help lower high blood pressure. You may need to change some of your habits. Lifestyle changes may include:  Following the DASH diet. This diet is high in fruits, vegetables, and whole grains. It is low in salt, red meat, and added sugars.  Keep your sodium intake below 2,300 mg per day.  Getting at least 30-45 minutes of aerobic exercise at least 4 times per week.  Losing weight if necessary.  Not smoking.  Limiting alcoholic beverages.  Learning ways to reduce stress. Your health care provider may prescribe medicine if lifestyle changes are not enough to get your blood pressure under control, and if one of the following is true:  You are 18-59 years of age and your systolic blood pressure is above 140.  You are 60 years of age or older, and your systolic blood pressure is above 150.  Your diastolic blood pressure is above 90.  You have diabetes, and your systolic blood pressure is over 140 or your diastolic blood pressure is over 90.  You have kidney disease and your blood pressure is above 140/90.  You have heart disease and your blood pressure is above 140/90. Your personal target blood pressure may vary depending on your medical conditions, your age, and other factors. HOME CARE INSTRUCTIONS    Have your blood pressure rechecked as directed by your health care provider.   Take medicines only as directed by your health care provider. Follow the directions carefully. Blood pressure medicines must be taken as prescribed. The medicine does not work as well when you skip doses. Skipping doses also puts you at risk for  problems.  Do not smoke.   Monitor your blood pressure at home as directed by your health care provider. SEEK MEDICAL CARE IF:   You think you are having a reaction to medicines taken.  You have recurrent headaches or feel dizzy.  You have swelling in your ankles.  You have trouble with your vision. SEEK IMMEDIATE MEDICAL CARE IF:  You develop a severe headache or confusion.  You have unusual weakness, numbness, or feel faint.  You have severe chest or abdominal pain.  You vomit repeatedly.  You have trouble breathing. MAKE SURE YOU:   Understand these instructions.  Will watch your condition.  Will get help right away if you are not doing well or get worse.   This information is not intended to replace advice given to you by your health care provider. Make sure you discuss any questions you have with your health care provider.   Document Released: 05/20/2005 Document Revised: 10/04/2014 Document Reviewed: 03/12/2013 Elsevier Interactive Patient Education 2016 [employer] Inc.  

## 2015-09-08 NOTE — Progress Notes (Signed)
Brief follow-up with pt, informing him that [facility] will be leaving [facility] in three weeks, and that he may make appointment, as needed, prior to end of April. Jonathan Wilson says he stopped going to [facility], but that has appointment to see another psychiatrist, he cannot recall name or exact date, near [facility].   Approximately 5 minutes, 3:00-3:05pm

## 2015-09-14 ENCOUNTER — Other Ambulatory Visit: Payer: Self-pay

## 2015-09-18 ENCOUNTER — Emergency Department (HOSPITAL_COMMUNITY): Payer: BLUE CROSS/BLUE SHIELD

## 2015-09-18 ENCOUNTER — Encounter (HOSPITAL_COMMUNITY): Payer: Self-pay | Admitting: Emergency Medicine

## 2015-09-18 ENCOUNTER — Inpatient Hospital Stay (HOSPITAL_COMMUNITY)
Admission: EM | Admit: 2015-09-18 | Discharge: 2015-09-20 | DRG: 312 | Disposition: A | Discharge status: Home / Self Care | Payer: BLUE CROSS/BLUE SHIELD | Attending: Internal Medicine | Admitting: Internal Medicine

## 2015-09-18 DIAGNOSIS — I129 Hypertensive chronic kidney disease with stage 1 through stage 4 chronic kidney disease, or unspecified chronic kidney disease: Secondary | ICD-10-CM | POA: Diagnosis present

## 2015-09-18 DIAGNOSIS — R7989 Other specified abnormal findings of blood chemistry: Secondary | ICD-10-CM | POA: Diagnosis present

## 2015-09-18 DIAGNOSIS — T424X1A Poisoning by benzodiazepines, accidental (unintentional), initial encounter: Secondary | ICD-10-CM | POA: Diagnosis present

## 2015-09-18 DIAGNOSIS — Z79899 Other long term (current) drug therapy: Secondary | ICD-10-CM

## 2015-09-18 DIAGNOSIS — R55 Syncope and collapse: Secondary | ICD-10-CM | POA: Diagnosis not present

## 2015-09-18 DIAGNOSIS — F329 Major depressive disorder, single episode, unspecified: Secondary | ICD-10-CM | POA: Diagnosis present

## 2015-09-18 DIAGNOSIS — N183 Chronic kidney disease, stage 3 (moderate): Secondary | ICD-10-CM | POA: Diagnosis present

## 2015-09-18 DIAGNOSIS — R680 Hypothermia, not associated with low environmental temperature: Secondary | ICD-10-CM | POA: Diagnosis present

## 2015-09-18 DIAGNOSIS — E785 Hyperlipidemia, unspecified: Secondary | ICD-10-CM | POA: Diagnosis present

## 2015-09-18 DIAGNOSIS — Z833 Family history of diabetes mellitus: Secondary | ICD-10-CM

## 2015-09-18 DIAGNOSIS — M109 Gout, unspecified: Secondary | ICD-10-CM | POA: Diagnosis present

## 2015-09-18 DIAGNOSIS — Z8261 Family history of arthritis: Secondary | ICD-10-CM

## 2015-09-18 DIAGNOSIS — D696 Thrombocytopenia, unspecified: Secondary | ICD-10-CM | POA: Diagnosis present

## 2015-09-18 DIAGNOSIS — F17210 Nicotine dependence, cigarettes, uncomplicated: Secondary | ICD-10-CM | POA: Diagnosis present

## 2015-09-18 DIAGNOSIS — Z800 Family history of malignant neoplasm of digestive organs: Secondary | ICD-10-CM

## 2015-09-18 DIAGNOSIS — I959 Hypotension, unspecified: Secondary | ICD-10-CM | POA: Diagnosis present

## 2015-09-18 DIAGNOSIS — F419 Anxiety disorder, unspecified: Secondary | ICD-10-CM | POA: Diagnosis present

## 2015-09-18 DIAGNOSIS — B1920 Unspecified viral hepatitis C without hepatic coma: Secondary | ICD-10-CM | POA: Diagnosis present

## 2015-09-18 LAB — COMPREHENSIVE METABOLIC PANEL
ALK PHOS: 75 U/L (ref 38–126)
ALT: 55 U/L (ref 17–63)
ANION GAP: 8 (ref 5–15)
AST: 82 U/L — ABNORMAL HIGH (ref 15–41)
Albumin: 4.2 g/dL (ref 3.5–5.0)
BUN: 19 mg/dL (ref 6–20)
CALCIUM: 9.1 mg/dL (ref 8.9–10.3)
CO2: 26 mmol/L (ref 22–32)
CREATININE: 1.49 mg/dL — AB (ref 0.61–1.24)
Chloride: 104 mmol/L (ref 101–111)
GFR, EST AFRICAN AMERICAN: 56 mL/min — AB (ref >60)
GFR, EST NON AFRICAN AMERICAN: 48 mL/min — AB (ref >60)
Glucose, Bld: 95 mg/dL (ref 65–99)
Potassium: 3.9 mmol/L (ref 3.5–5.1)
SODIUM: 138 mmol/L (ref 135–145)
Total Bilirubin: 1 mg/dL (ref 0.3–1.2)
Total Protein: 7.1 g/dL (ref 6.5–8.1)

## 2015-09-18 LAB — CBC WITH DIFFERENTIAL/PLATELET
Basophils Absolute: 0 10*3/uL (ref 0.0–0.1)
Basophils Relative: 0 %
EOS ABS: 0.1 10*3/uL (ref 0.0–0.7)
EOS PCT: 3 %
HCT: 38.6 % — ABNORMAL LOW (ref 39.0–52.0)
HEMOGLOBIN: 14.1 g/dL (ref 13.0–17.0)
LYMPHS ABS: 1.4 10*3/uL (ref 0.7–4.0)
LYMPHS PCT: 30 %
MCH: 34.1 pg — AB (ref 26.0–34.0)
MCHC: 36.5 g/dL — AB (ref 30.0–36.0)
MCV: 93.2 fL (ref 78.0–100.0)
MONOS PCT: 9 %
Monocytes Absolute: 0.4 10*3/uL (ref 0.1–1.0)
Neutro Abs: 2.8 10*3/uL (ref 1.7–7.7)
Neutrophils Relative %: 58 %
PLATELETS: 123 10*3/uL — AB (ref 150–400)
RBC: 4.14 MIL/uL — ABNORMAL LOW (ref 4.22–5.81)
RDW: 13.2 % (ref 11.5–15.5)
WBC: 4.8 10*3/uL (ref 4.0–10.5)

## 2015-09-18 LAB — RAPID URINE DRUG SCREEN, HOSP PERFORMED
Amphetamines: NOT DETECTED
BARBITURATES: NOT DETECTED
BENZODIAZEPINES: POSITIVE — AB
COCAINE: NOT DETECTED
Opiates: NOT DETECTED
TETRAHYDROCANNABINOL: NOT DETECTED

## 2015-09-18 LAB — ACETAMINOPHEN LEVEL: Acetaminophen (Tylenol), Serum: <10 ug/mL — ABNORMAL LOW (ref 10–30)

## 2015-09-18 LAB — I-STAT CG4 LACTIC ACID, ED: LACTIC ACID, VENOUS: 1.26 mmol/L (ref 0.5–2.0)

## 2015-09-18 LAB — SALICYLATE LEVEL: Salicylate Lvl: <4 mg/dL (ref 2.8–30.0)

## 2015-09-18 LAB — CBG MONITORING, ED: GLUCOSE-CAPILLARY: 86 mg/dL (ref 65–99)

## 2015-09-18 LAB — ETHANOL: Alcohol, Ethyl (B): <5 mg/dL (ref <5)

## 2015-09-18 MED ORDER — SODIUM CHLORIDE 0.9 % IV SOLN
INTRAVENOUS | Status: DC
  Administered 2015-09-19: 03:00:00 via INTRAVENOUS
  Administered 2015-09-19: 125 mL/h via INTRAVENOUS

## 2015-09-18 MED ORDER — ACETAMINOPHEN 325 MG PO TABS
650.0000 mg | ORAL_TABLET | Freq: Once | ORAL | Status: AC
  Administered 2015-09-18: 650 mg via ORAL
  Filled 2015-09-18: qty 2

## 2015-09-18 MED ORDER — SODIUM CHLORIDE 0.9 % IV BOLUS (SEPSIS): 2000.0000 mL | Freq: Once | INTRAVENOUS | Status: DC

## 2015-09-18 MED ORDER — SODIUM CHLORIDE 0.9 % IV BOLUS (SEPSIS)
1000.0000 mL | Freq: Once | INTRAVENOUS | Status: AC
  Administered 2015-09-18: 1000 mL via INTRAVENOUS

## 2015-09-18 NOTE — ED Provider Notes (Signed)
CSN: [id number]     Arrival date & time 09/18/15  09-14-2101 History   First MD Initiated Contact with Patient 09/18/15 Sep 14, 2137     Chief Complaint  Patient presents with  . Drug Overdose     (Consider location/radiation/quality/duration/timing/severity/associated sxs/prior Treatment) HPI Comments: Patient here after his relative called due to him being unresponsive. Patient missed alcohol use this evening states that he only had one beer. Denies any illicit drug use at this time. No recent illnesses such as fever, vomiting, diarrhea. EMS was called and patient is noted to have low pulse oximetry with O2 sat of 80%. Is also mildly hypotensive. CBG was 110. Pupils are pinpoint. Was given IV fluids and he became more alert and patient transported here. Patient denies any recent suicide attempt. States he feels drowsy but otherwise feels okay.  Patient is a 65 y.o. male presenting with Overdose. The history is provided by the patient.  Drug Overdose    Past Medical History  Diagnosis Date  . Pneumonia     recurrent episodes   . Hypertension   . Hyperlipidemia   . Gout   . Vitamin D deficiency   . Hepatitis C before 15-Sep-1998  . Obesity     300 # in September 14, 2008, BMI 34 in 04/2013.   . Right lower lobe lung mass 05/2008    resolved on follow up CT 09/15/2010.   [name] Psychosis 09/15/98    s/e of treatment for Hepatitis C.    . Influenza A 06/2013    acute resp failure, did not require intubation.  . Diverticulosis of colon Sep 14, 2009    seen on CT scan in 09/14/2009.   [name] Anxiety and depression 15-Sep-1998    worse after death of wife 2013-03-16  . Anxiety   . Depression    Past Surgical History  Procedure Laterality Date  . Mandible fracture surgery    . Cholecystectomy N/A 01/14/2014    Procedure: LAPAROSCOPIC CHOLECYSTECTOMY WITH INTRAOPERATIVE CHOLANGIOGRAM;  Surgeon: Shann Medal, MD;  Location: [facility];  Service: General;  Laterality: N/A;  . Robotic assited partial nephrectomy Right 08/07/2015    Procedure: XI ROBOTIC ASSISTED  RIGHT PARTIAL NEPHRECTOMY;  Surgeon: Cleon Gustin, MD;  Location: [facility];  Service: Urology;  Laterality: Right;   Family History  Problem Relation Age of Onset  . Diabetes Mother   . Arthritis Mother   . Cancer Father     pancreatic cancer   Social History  Substance Use Topics  . Smoking status: Current Every Day Smoker -- 1.00 packs/day for 40 years    Types: Cigarettes  . Smokeless tobacco: Never Used  . Alcohol Use: 0.6 oz/week    1 Cans of beer per week     Comment: hx of etoh abuse- 12 years ago     Review of Systems  All other systems reviewed and are negative.     Allergies  Review of patient's allergies indicates no known allergies.  Home Medications   Prior to Admission medications   Medication Sig Start Date End Date Taking? Authorizing Provider  acetaminophen-codeine (TYLENOL #3) 300-30 MG tablet Take 1 tablet by mouth every 8 (eight) hours as needed for moderate pain. 09/08/15  Yes Arnoldo Morale, MD  albuterol (PROVENTIL HFA;VENTOLIN HFA) 108 (90 Base) MCG/ACT inhaler Inhale 2-4 puffs into the lungs every 4 (four) hours as needed for wheezing or shortness of breath. 07/25/15  Yes Arnoldo Morale, MD  allopurinol (ZYLOPRIM) 300 MG tablet Take 1 tablet (300 mg  total) by mouth every morning. 05/24/15  Yes Arnoldo Morale, MD  amLODipine (NORVASC) 10 MG tablet Take 10 mg by mouth daily.   Yes Historical Provider, MD  labetalol (NORMODYNE) 200 MG tablet Take 1 tablet (200 mg total) by mouth 2 (two) times daily. 08/03/15  Yes Arnoldo Morale, MD  lisinopril-hydrochlorothiazide (ZESTORETIC) 20-12.5 MG tablet Take 2 tablets by mouth daily. 07/25/15  Yes Arnoldo Morale, MD  PARoxetine (PAXIL) 30 MG tablet Take 30 mg by mouth every morning.   Yes Historical Provider, MD  risperiDONE (RISPERDAL) 1 MG tablet Take 1 mg by mouth 2 (two) times daily.   Yes Historical Provider, MD  traZODone (DESYREL) 50 MG tablet Take 50 mg by mouth at bedtime.   Yes Historical Provider, MD   BP 82/55  mmHg  Pulse 72  Temp(Src) 97.5 F (36.4 C) (Oral)  Resp 16  SpO2 90% Physical Exam  Constitutional: He is oriented to person, place, and time. He appears well-developed and well-nourished.  Non-toxic appearance. No distress.  HENT:  Head: Normocephalic and atraumatic.  Eyes: Conjunctivae, EOM and lids are normal. Pupils are equal, round, and reactive to light.  Neck: Normal range of motion. Neck supple. No tracheal deviation present. No thyroid mass present.  Cardiovascular: Normal rate, regular rhythm and normal heart sounds.  Exam reveals no gallop.   No murmur heard. Pulmonary/Chest: Effort normal. No stridor. No respiratory distress. He has decreased breath sounds. He has no wheezes. He has no rhonchi. He has no rales.  Abdominal: Soft. Normal appearance and bowel sounds are normal. He exhibits no distension. There is no tenderness. There is no rebound and no CVA tenderness.  Musculoskeletal: Normal range of motion. He exhibits no edema or tenderness.  Neurological: He is alert and oriented to person, place, and time. He has normal strength. No cranial nerve deficit or sensory deficit. GCS eye subscore is 4. GCS verbal subscore is 5. GCS motor subscore is 6.  Skin: Skin is warm and dry. No abrasion and no rash noted.  Psychiatric: His affect is blunt. His speech is delayed. He is slowed. He expresses no suicidal plans and no homicidal plans.  Nursing note and vitals reviewed.   ED Course  Procedures (including critical care time) Labs Review Labs Reviewed  ETHANOL  URINE RAPID DRUG SCREEN, HOSP PERFORMED  SALICYLATE LEVEL  ACETAMINOPHEN LEVEL  CBC WITH DIFFERENTIAL/PLATELET  COMPREHENSIVE METABOLIC PANEL    Imaging Review No results found. I have personally reviewed and evaluated these images and lab results as part of my medical decision-making.   EKG Interpretation   Date/Time:  Monday September 18 2015 21:15:21 EDT Ventricular Rate:  68 PR Interval:  157 QRS Duration:  118 QT Interval:  395 QTC Calculation: 420 R Axis:   -5 Text Interpretation:  Sinus rhythm Incomplete RBBB and LAFB Low voltage,  precordial leads No significant change since last tracing Confirmed by  Annell Canty  MD, Kalei Mckillop ([id number]) on 09/18/2015 9:55:03 PM      MDM   Final diagnoses:  None    Patient given IV fluids here for his hypotension and blood pressure gradually improved. Head CT negative. Lactate within normal limits. He is afebrile. Suspect polysubstance abuse. We'll admit for syncope  CRITICAL CARE Performed by: Leota Jacobsen Total critical care time: 50 minutes Critical care time was exclusive of separately billable procedures and treating other patients. Critical care was necessary to treat or prevent imminent or life-threatening deterioration. Critical care was time spent personally by me on the  following activities: development of treatment plan with patient and/or surrogate as well as nursing, discussions with consultants, evaluation of patient's response to treatment, examination of patient, obtaining history from patient or surrogate, ordering and performing treatments and interventions, ordering and review of laboratory studies, ordering and review of radiographic studies, pulse oximetry and re-evaluation of patient's condition.     Lacretia Leigh, MD 09/19/15 0010

## 2015-09-18 NOTE — ED Notes (Signed)
Nurse will draw blood 

## 2015-09-18 NOTE — ED Notes (Signed)
Patient transported to CT 

## 2015-09-18 NOTE — ED Notes (Signed)
Bed: [id number] Expected date:  Expected time:  Means of arrival:  Comments: Found unresponsive

## 2015-09-18 NOTE — ED Notes (Signed)
Patients son called, found father on floor difficult to arouse, splashed water on face. Pin point pupils, unsure of what medication was taken. Per EMS patients respirations 8-10, HR 70, bp 90/60, o2 88% on  ra, 96% on [place]. cbg 110. Patients roommate told ems patient had one beer. Initial BP systolic 72. Patient was drowsy when talking to ems. Per EMS patient was alert x3, unaware of year and month, slightly aggressive and asking why he was being taken to hospital when ems first arrived.

## 2015-09-19 ENCOUNTER — Inpatient Hospital Stay (HOSPITAL_COMMUNITY): Payer: BLUE CROSS/BLUE SHIELD

## 2015-09-19 DIAGNOSIS — N183 Chronic kidney disease, stage 3 (moderate): Secondary | ICD-10-CM | POA: Diagnosis present

## 2015-09-19 DIAGNOSIS — I129 Hypertensive chronic kidney disease with stage 1 through stage 4 chronic kidney disease, or unspecified chronic kidney disease: Secondary | ICD-10-CM | POA: Diagnosis present

## 2015-09-19 DIAGNOSIS — I959 Hypotension, unspecified: Secondary | ICD-10-CM | POA: Diagnosis present

## 2015-09-19 DIAGNOSIS — R7989 Other specified abnormal findings of blood chemistry: Secondary | ICD-10-CM | POA: Diagnosis present

## 2015-09-19 DIAGNOSIS — E785 Hyperlipidemia, unspecified: Secondary | ICD-10-CM | POA: Diagnosis present

## 2015-09-19 DIAGNOSIS — Z833 Family history of diabetes mellitus: Secondary | ICD-10-CM | POA: Diagnosis not present

## 2015-09-19 DIAGNOSIS — I952 Hypotension due to drugs: Secondary | ICD-10-CM | POA: Diagnosis not present

## 2015-09-19 DIAGNOSIS — Z800 Family history of malignant neoplasm of digestive organs: Secondary | ICD-10-CM | POA: Diagnosis not present

## 2015-09-19 DIAGNOSIS — T424X1A Poisoning by benzodiazepines, accidental (unintentional), initial encounter: Secondary | ICD-10-CM | POA: Diagnosis present

## 2015-09-19 DIAGNOSIS — I10 Essential (primary) hypertension: Secondary | ICD-10-CM | POA: Diagnosis not present

## 2015-09-19 DIAGNOSIS — R55 Syncope and collapse: Secondary | ICD-10-CM | POA: Diagnosis present

## 2015-09-19 DIAGNOSIS — F419 Anxiety disorder, unspecified: Secondary | ICD-10-CM | POA: Diagnosis present

## 2015-09-19 DIAGNOSIS — Z79899 Other long term (current) drug therapy: Secondary | ICD-10-CM | POA: Diagnosis not present

## 2015-09-19 DIAGNOSIS — B1920 Unspecified viral hepatitis C without hepatic coma: Secondary | ICD-10-CM | POA: Diagnosis present

## 2015-09-19 DIAGNOSIS — Z8261 Family history of arthritis: Secondary | ICD-10-CM | POA: Diagnosis not present

## 2015-09-19 DIAGNOSIS — D696 Thrombocytopenia, unspecified: Secondary | ICD-10-CM | POA: Diagnosis present

## 2015-09-19 DIAGNOSIS — F329 Major depressive disorder, single episode, unspecified: Secondary | ICD-10-CM | POA: Diagnosis present

## 2015-09-19 DIAGNOSIS — F17210 Nicotine dependence, cigarettes, uncomplicated: Secondary | ICD-10-CM | POA: Diagnosis present

## 2015-09-19 DIAGNOSIS — R680 Hypothermia, not associated with low environmental temperature: Secondary | ICD-10-CM | POA: Diagnosis present

## 2015-09-19 DIAGNOSIS — M109 Gout, unspecified: Secondary | ICD-10-CM | POA: Diagnosis present

## 2015-09-19 LAB — GLUCOSE, CAPILLARY: GLUCOSE-CAPILLARY: 97 mg/dL (ref 65–99)

## 2015-09-19 LAB — ECHOCARDIOGRAM COMPLETE
HEIGHTINCHES: 75.5 in
Weight: 4099.2 oz

## 2015-09-19 MED ORDER — RISPERIDONE 0.5 MG PO TABS
1.0000 mg | ORAL_TABLET | Freq: Two times a day (BID) | ORAL | Status: DC
  Administered 2015-09-19 – 2015-09-20 (×4): 1 mg via ORAL
  Filled 2015-09-19 (×4): qty 2

## 2015-09-19 MED ORDER — LISINOPRIL 20 MG PO TABS
20.0000 mg | ORAL_TABLET | Freq: Every day | ORAL | Status: DC
  Administered 2015-09-19 – 2015-09-20 (×2): 20 mg via ORAL
  Filled 2015-09-19 (×2): qty 1

## 2015-09-19 MED ORDER — PAROXETINE HCL 20 MG PO TABS
30.0000 mg | ORAL_TABLET | Freq: Every morning | ORAL | Status: DC
  Administered 2015-09-19 – 2015-09-20 (×2): 30 mg via ORAL
  Filled 2015-09-19 (×2): qty 2

## 2015-09-19 MED ORDER — LISINOPRIL-HYDROCHLOROTHIAZIDE 20-12.5 MG PO TABS: 2.0000 | ORAL_TABLET | Freq: Every day | ORAL | Status: DC

## 2015-09-19 MED ORDER — HYDROCHLOROTHIAZIDE 12.5 MG PO CAPS
12.5000 mg | ORAL_CAPSULE | Freq: Every day | ORAL | Status: DC
  Administered 2015-09-19 – 2015-09-20 (×2): 12.5 mg via ORAL
  Filled 2015-09-19 (×2): qty 1

## 2015-09-19 MED ORDER — LABETALOL HCL 200 MG PO TABS: 200.0000 mg | ORAL_TABLET | Freq: Two times a day (BID) | ORAL | Status: DC

## 2015-09-19 MED ORDER — ALLOPURINOL 300 MG PO TABS
300.0000 mg | ORAL_TABLET | Freq: Every morning | ORAL | Status: DC
  Administered 2015-09-19 – 2015-09-20 (×2): 300 mg via ORAL
  Filled 2015-09-19 (×2): qty 1

## 2015-09-19 MED ORDER — ALBUTEROL SULFATE HFA 108 (90 BASE) MCG/ACT IN AERS: 2.0000 | INHALATION_SPRAY | RESPIRATORY_TRACT | Status: DC | PRN

## 2015-09-19 MED ORDER — AMLODIPINE BESYLATE 10 MG PO TABS: 10.0000 mg | ORAL_TABLET | Freq: Every day | ORAL | Status: DC

## 2015-09-19 MED ORDER — LABETALOL HCL 100 MG PO TABS: 100.0000 mg | ORAL_TABLET | Freq: Two times a day (BID) | ORAL | Status: DC

## 2015-09-19 MED ORDER — ALBUTEROL SULFATE (2.5 MG/3ML) 0.083% IN NEBU: 2.5000 mg | INHALATION_SOLUTION | Freq: Four times a day (QID) | RESPIRATORY_TRACT | Status: DC | PRN

## 2015-09-19 MED ORDER — SODIUM CHLORIDE 0.9% FLUSH
3.0000 mL | Freq: Two times a day (BID) | INTRAVENOUS | Status: DC
  Administered 2015-09-19 – 2015-09-20 (×2): 3 mL via INTRAVENOUS

## 2015-09-19 MED ORDER — HEPARIN SODIUM (PORCINE) 5000 UNIT/ML IJ SOLN
5000.0000 [IU] | Freq: Three times a day (TID) | INTRAMUSCULAR | Status: DC
  Administered 2015-09-19 – 2015-09-20 (×5): 5000 [IU] via SUBCUTANEOUS
  Filled 2015-09-19 (×4): qty 1

## 2015-09-19 MED ORDER — TRAZODONE HCL 50 MG PO TABS
50.0000 mg | ORAL_TABLET | Freq: Every day | ORAL | Status: DC
  Administered 2015-09-19 (×2): 50 mg via ORAL
  Filled 2015-09-19 (×2): qty 1

## 2015-09-19 NOTE — Progress Notes (Addendum)
PROGRESS NOTE    Jonathan Wilson  [id number] DOB: 13-Oct-1950 DOA: 09/18/2015 PCP: Arnoldo Morale, MD  Outpatient Specialists:     Brief Narrative:  65 year old male with history of HTN, HLD, gout, hepatitis C, anxiety, depression, was found down by son at home. Initial BPs in the 70s in ED. Patient admitted to taking illicit Xanax. Recently antihypertensives changed from amlodipine to labetalol. Admitted for evaluation and management of syncope.   Assessment & Plan:   Principal Problem:   Syncope Active Problems:   Hypotension   Syncope - Felt to be either due to overdose of Xanax versus labetalol related hypotension - No arrhythmias on telemetry. Check orthostatic blood pressures. CT head without acute findings. - Follow 2-D echo results. - Labetalol discontinued. - Patient counseled regarding abstinence from medication abuse and he verbalized understanding.  Hypotension/essential hypertension - Hypotension seems to have resolved. Labetalol discontinued. Patient on chronic lisinopril/HCTZ at home.  Gout - Continue allopurinol.  Anxiety and depression - Stable. Continue paroxetine and Risperdal.  Hypothermia - Unclear etiology. Resolved. Monitor.  Stage III chronic kidney disease - Creatinine in February was 1.06. However in early March it ranged between 1.6-1.8. Admitted with creatinine of 1.49. Follow BMP in a.m.  Thrombocytopenia - Mild and intermittent and chronic. Follow CBCs.   DVT prophylaxis: Heparin Code Status: Full Family Communication: None at bedside Disposition Plan: DC home, possibly in the next 1-2 days.   Consultants:   None   Procedures:   None  Antimicrobials:   None    Subjective: States that he feels better. Complained of some headache and neck pain this morning. As per RN, no acute issues.  Objective: Filed Vitals:   09/19/15 0214 09/19/15 0556 09/19/15 0649 09/19/15 1427  BP: 123/65 93/55 118/78 128/70  Pulse: 62 56 55  55  Temp: 96.8 F (36 C) 96.2 F (35.7 C) 97.4 F (36.3 C) 97.5 F (36.4 C)  TempSrc: Axillary Axillary Oral Oral  Resp: 16 16  18   Height: 6' 3.5" (1.918 m)     Weight: 116.212 kg (256 lb 3.2 oz)     SpO2: 97% 99%  98%    Intake/Output Summary (Last 24 hours) at 09/19/15 1727 Last data filed at 09/19/15 1400  Gross per 24 hour  Intake 1562.5 ml  Output   1500 ml  Net   62.5 ml   Filed Weights   09/19/15 0214  Weight: 116.212 kg (256 lb 3.2 oz)    Examination:  General exam: Pleasant middle-aged male sitting up comfortably in bed. Appears calm and comfortable  Respiratory system: Clear to auscultation. Respiratory effort normal. Cardiovascular system: S1 & S2 heard, RRR. No JVD, murmurs, rubs, gallops or clicks. No pedal edema. Telemetry: SB with ventricular rate occasional in the 40s-SR. Gastrointestinal system: Abdomen is nondistended, soft and nontender. No organomegaly or masses felt. Normal bowel sounds heard. Central nervous system: Alert and oriented. No focal neurological deficits. Extremities: Symmetric 5 x 5 power. Skin: No rashes, lesions or ulcers Psychiatry: Judgement and insight appear normal. Mood & affect appropriate.     Data Reviewed: I have personally reviewed following labs and imaging studies  CBC:  Recent Labs Lab 09/18/15 2217  WBC 4.8  NEUTROABS 2.8  HGB 14.1  HCT 38.6*  MCV 93.2  PLT [id number]*   Basic Metabolic Panel:  Recent Labs Lab 09/18/15 2217  NA 138  K 3.9  CL 104  CO2 26  GLUCOSE 95  BUN 19  CREATININE 1.49*  CALCIUM 9.1  GFR: Estimated Creatinine Clearance: 70.3 mL/min (by C-G formula based on Cr of 1.49). Liver Function Tests:  Recent Labs Lab 09/18/15 2217  AST 82*  ALT 55  ALKPHOS 75  BILITOT 1.0  PROT 7.1  ALBUMIN 4.2   No results for input(s): LIPASE, AMYLASE in the last 168 hours. No results for input(s): AMMONIA in the last 168 hours. Coagulation Profile: No results for input(s): INR, PROTIME in  the last 168 hours. Cardiac Enzymes: No results for input(s): CKTOTAL, CKMB, CKMBINDEX, TROPONINI in the last 168 hours. BNP (last 3 results) No results for input(s): PROBNP in the last 8760 hours. HbA1C: No results for input(s): HGBA1C in the last 72 hours. CBG:  Recent Labs Lab 09/18/15 2216 09/19/15 0807  GLUCAP 86 97   Lipid Profile: No results for input(s): CHOL, HDL, LDLCALC, TRIG, CHOLHDL, LDLDIRECT in the last 72 hours. Thyroid Function Tests: No results for input(s): TSH, T4TOTAL, FREET4, T3FREE, THYROIDAB in the last 72 hours. Anemia Panel: No results for input(s): VITAMINB12, FOLATE, FERRITIN, TIBC, IRON, RETICCTPCT in the last 72 hours. Urine analysis:    Component Value Date/Time   COLORURINE YELLOW 04/29/2015 2141   APPEARANCEUR CLEAR 04/29/2015 2141   LABSPEC 1.008 04/29/2015 2141   PHURINE 7.0 04/29/2015 2141   GLUCOSEU NEGATIVE 04/29/2015 2141   HGBUR NEGATIVE 04/29/2015 2141   HGBUR negative 03/07/2009 0849   BILIRUBINUR NEGATIVE 04/29/2015 2141   [place] NEGATIVE 04/29/2015 2141   PROTEINUR NEGATIVE 04/29/2015 2141   UROBILINOGEN 1.0 01/02/2015 1353   NITRITE NEGATIVE 04/29/2015 2141   LEUKOCYTESUR NEGATIVE 04/29/2015 2141   Sepsis Labs: @LABRCNTIP (procalcitonin:4,lacticidven:4)  )No results found for this or any previous visit (from the past 240 hour(s)).       Radiology Studies: Ct Head Wo Contrast  09/18/2015  CLINICAL DATA:  Altered mental status. Patient was found on the floor and was difficult to arouse. EXAM: CT HEAD WITHOUT CONTRAST TECHNIQUE: Contiguous axial images were obtained from the base of the skull through the vertex without intravenous contrast. COMPARISON:  06/03/2006 FINDINGS: Mild cerebral atrophy. No ventricular dilatation. Small patchy areas of low-attenuation in the deep white matter are nonspecific but likely represent small vessel ischemic changes. No mass effect or midline shift. No abnormal extra-axial fluid  collections. Gray-white matter junctions are distinct. Basal cisterns are not effaced. No evidence of acute intracranial hemorrhage. No depressed skull fractures. Mucosal thickening in the left maxillary antrum. No acute air-fluid levels. Mastoid air cells are not opacified. Old nasal bone deformities. Vascular calcifications. IMPRESSION: No acute intracranial abnormalities. Mild atrophy and small vessel ischemic changes. Electronically Signed   By: Lucienne Capers M.D.   On: 09/18/2015 23:57   Dg Chest Port 1 View  09/18/2015  CLINICAL DATA:  Shortness of breath. Patient was found down on the floor, difficult to arouse. Pinpoint pupils. EXAM: PORTABLE CHEST 1 VIEW COMPARISON:  08/29/2014 FINDINGS: Slight fibrosis in the lung bases. Normal heart size and pulmonary vascularity. No focal airspace disease or consolidation in the lungs. No blunting of costophrenic angles. No pneumothorax. Mediastinal contours appear intact. Calcification of the aorta. IMPRESSION: No active disease. Electronically Signed   By: Lucienne Capers M.D.   On: 09/18/2015 21:57        Scheduled Meds: . allopurinol  300 mg Oral q morning - 10a  . heparin  5,000 Units Subcutaneous 3 times per day  . hydrochlorothiazide  12.5 mg Oral Daily  . lisinopril  20 mg Oral Daily  . PARoxetine  30 mg Oral q  morning - 10a  . risperiDONE  1 mg Oral BID  . sodium chloride  2,000 mL Intravenous Once  . sodium chloride flush  3 mL Intravenous Q12H  . traZODone  50 mg Oral QHS   Continuous Infusions: . sodium chloride 125 mL/hr (09/19/15 1037)     LOS: 0 days    Time spent: 25 minutes.    [facility], MD [facility] Pager [phone]  If 7PM-7AM, please contact night-coverage [email or web address] Password [facility] 09/19/2015, 5:27 PM

## 2015-09-19 NOTE — H&P (Signed)
[facility] History and Physical  Jonathan Wilson [id number] DOB: 29-Jul-1950 DOA: 09/18/2015  Referring physician: EDP PCP: Arnoldo Morale, MD   Chief Complaint: Found down   HPI: Jonathan Wilson is a 65 y.o. male with h/o HTN, patient is found down by son at home.  Initially with pin-point pupils he is brought in by EMS.  Initial BP is [id number] systolic in ED, this improves with IVF.  Patient does admit to recently resuming taking illicit Xanax (after being confronted with his UDS), but he insists he only took 1x 1mg  pill today and only 4 pills or so over the past 3 days.  Patient also was recently started on labetalol (swapped from amlodipine) in addition to his chronic lisinopril-hctz for HTN back in March.  Review of Systems: Systems reviewed.  As above, otherwise negative  Past Medical History  Diagnosis Date  . Pneumonia     recurrent episodes   . Hypertension   . Hyperlipidemia   . Gout   . Vitamin D deficiency   . Hepatitis C before 07-Sep-1998  . Obesity     300 # in 2008/09/06, BMI 34 in 04/2013.   . Right lower lobe lung mass 05/2008    resolved on follow up CT 07-Sep-2010.   [name] Psychosis 1998-09-07    s/e of treatment for Hepatitis C.    . Influenza A 06/2013    acute resp failure, did not require intubation.  . Diverticulosis of colon 06-Sep-2009    seen on CT scan in 09/06/09.   [name] Anxiety and depression September 07, 1998    worse after death of wife 08-Mar-2013  . Anxiety   . Depression    Past Surgical History  Procedure Laterality Date  . Mandible fracture surgery    . Cholecystectomy N/A 01/14/2014    Procedure: LAPAROSCOPIC CHOLECYSTECTOMY WITH INTRAOPERATIVE CHOLANGIOGRAM;  Surgeon: Shann Medal, MD;  Location: [facility];  Service: General;  Laterality: N/A;  . Robotic assited partial nephrectomy Right 08/07/2015    Procedure: XI ROBOTIC ASSISTED RIGHT PARTIAL NEPHRECTOMY;  Surgeon: Cleon Gustin, MD;  Location: [facility];  Service: Urology;  Laterality: Right;   Social History:  reports that he has been  smoking Cigarettes.  He has a 40 pack-year smoking history. He has never used smokeless tobacco. He reports that he drinks about 0.6 oz of alcohol per week. He reports that he does not use illicit drugs.  No Known Allergies  Family History  Problem Relation Age of Onset  . Diabetes Mother   . Arthritis Mother   . Cancer Father     pancreatic cancer     Prior to Admission medications   Medication Sig Start Date End Date Taking? Authorizing Provider  albuterol (PROVENTIL HFA;VENTOLIN HFA) 108 (90 Base) MCG/ACT inhaler Inhale 2-4 puffs into the lungs every 4 (four) hours as needed for wheezing or shortness of breath. 07/25/15  Yes Arnoldo Morale, MD  allopurinol (ZYLOPRIM) 300 MG tablet Take 1 tablet (300 mg total) by mouth every morning. 05/24/15  Yes Arnoldo Morale, MD  amLODipine (NORVASC) 10 MG tablet Take 10 mg by mouth daily.   Yes Historical Provider, MD  labetalol (NORMODYNE) 200 MG tablet Take 1 tablet (200 mg total) by mouth 2 (two) times daily. 08/03/15  Yes Arnoldo Morale, MD  lisinopril-hydrochlorothiazide (ZESTORETIC) 20-12.5 MG tablet Take 2 tablets by mouth daily. 07/25/15  Yes Arnoldo Morale, MD  PARoxetine (PAXIL) 30 MG tablet Take 30 mg by mouth every morning.   Yes Historical  Provider, MD  risperiDONE (RISPERDAL) 1 MG tablet Take 1 mg by mouth 2 (two) times daily.   Yes Historical Provider, MD  traZODone (DESYREL) 50 MG tablet Take 50 mg by mouth at bedtime.   Yes Historical Provider, MD   Physical Exam: Filed Vitals:   09/18/15 2119  BP: 82/55  Pulse: 72  Temp: 97.5 F (36.4 C)  Resp: 16    BP 82/55 mmHg  Pulse 72  Temp(Src) 97.5 F (36.4 C) (Oral)  Resp 16  SpO2 90%  General Appearance:    Alert, oriented, no distress, appears stated age  Head:    Normocephalic, atraumatic  Eyes:    PERRL, EOMI, sclera non-icteric        Nose:   Nares without drainage or epistaxis. Mucosa, turbinates normal  Throat:   Moist mucous membranes. Oropharynx without erythema or exudate.   Neck:   Supple. No carotid bruits.  No thyromegaly.  No lymphadenopathy.   Back:     No CVA tenderness, no spinal tenderness  Lungs:     Clear to auscultation bilaterally, without wheezes, rhonchi or rales  Chest wall:    No tenderness to palpitation  Heart:    Regular rate and rhythm without murmurs, gallops, rubs  Abdomen:     Soft, non-tender, nondistended, normal bowel sounds, no organomegaly  Genitalia:    deferred  Rectal:    deferred  Extremities:   No clubbing, cyanosis or edema.  Pulses:   2+ and symmetric all extremities  Skin:   Skin color, texture, turgor normal, no rashes or lesions  Lymph nodes:   Cervical, supraclavicular, and axillary nodes normal  Neurologic:   CNII-XII intact. Normal strength, sensation and reflexes      throughout    Labs on Admission:  Basic Metabolic Panel:  Recent Labs Lab 09/18/15 2217  NA 138  K 3.9  CL 104  CO2 26  GLUCOSE 95  BUN 19  CREATININE 1.49*  CALCIUM 9.1   Liver Function Tests:  Recent Labs Lab 09/18/15 2217  AST 82*  ALT 55  ALKPHOS 75  BILITOT 1.0  PROT 7.1  ALBUMIN 4.2   No results for input(s): LIPASE, AMYLASE in the last 168 hours. No results for input(s): AMMONIA in the last 168 hours. CBC:  Recent Labs Lab 09/18/15 2217  WBC 4.8  NEUTROABS 2.8  HGB 14.1  HCT 38.6*  MCV 93.2  PLT 123*   Cardiac Enzymes: No results for input(s): CKTOTAL, CKMB, CKMBINDEX, TROPONINI in the last 168 hours.  BNP (last 3 results) No results for input(s): PROBNP in the last 8760 hours. CBG:  Recent Labs Lab 09/18/15 2216  GLUCAP 86    Radiological Exams on Admission: Ct Head Wo Contrast  09/18/2015  CLINICAL DATA:  Altered mental status. Patient was found on the floor and was difficult to arouse. EXAM: CT HEAD WITHOUT CONTRAST TECHNIQUE: Contiguous axial images were obtained from the base of the skull through the vertex without intravenous contrast. COMPARISON:  06/03/2006 FINDINGS: Mild cerebral atrophy. No  ventricular dilatation. Small patchy areas of low-attenuation in the deep white matter are nonspecific but likely represent small vessel ischemic changes. No mass effect or midline shift. No abnormal extra-axial fluid collections. Gray-white matter junctions are distinct. Basal cisterns are not effaced. No evidence of acute intracranial hemorrhage. No depressed skull fractures. Mucosal thickening in the left maxillary antrum. No acute air-fluid levels. Mastoid air cells are not opacified. Old nasal bone deformities. Vascular calcifications. IMPRESSION: No acute intracranial  abnormalities. Mild atrophy and small vessel ischemic changes. Electronically Signed   By: Lucienne Capers M.D.   On: 09/18/2015 23:57   Dg Chest Port 1 View  09/18/2015  CLINICAL DATA:  Shortness of breath. Patient was found down on the floor, difficult to arouse. Pinpoint pupils. EXAM: PORTABLE CHEST 1 VIEW COMPARISON:  08/29/2014 FINDINGS: Slight fibrosis in the lung bases. Normal heart size and pulmonary vascularity. No focal airspace disease or consolidation in the lungs. No blunting of costophrenic angles. No pneumothorax. Mediastinal contours appear intact. Calcification of the aorta. IMPRESSION: No active disease. Electronically Signed   By: Lucienne Capers M.D.   On: 09/18/2015 21:57    EKG: Independently reviewed.  Assessment/Plan Principal Problem:   Syncope Active Problems:   Hypotension   1. Syncope - 1. Due either to OD of Xanax, vs labetalol 2. Will stop labetalol and just continue his chronic lisinopril / HCTZ 3. IVF 4. 2d echo 2. Hypotension - 1. See above, stopping labetalol    Code Status: Full Code  Family Communication: No family in room Disposition Plan: Admit to inpatient   Time spent: 50 min  GARDNER, JARED M. [facility] Pager [phone]  If 7AM-7PM, please contact the day team taking care of the patient [email or web address] Password [facility] 09/19/2015, 12:37 AM

## 2015-09-19 NOTE — Progress Notes (Signed)
Nutrition Brief Note  Patient identified on the Malnutrition Screening Tool (MST) Report  Wt Readings from Last 15 Encounters:  09/19/15 256 lb 3.2 oz (116.212 kg)  09/08/15 257 lb (116.574 kg)  08/07/15 252 lb 12 oz (114.647 kg)  08/03/15 252 lb 12.8 oz (114.669 kg)  08/01/15 251 lb (113.853 kg)  07/25/15 252 lb 6.4 oz (114.488 kg)  06/28/15 253 lb 9.6 oz (115.032 kg)  05/24/15 255 lb (115.667 kg)  04/29/15 252 lb 10.4 oz (114.6 kg)  01/02/15 255 lb (115.667 kg)  08/29/14 240 lb (108.863 kg)  06/10/14 243 lb (110.224 kg)  05/08/14 244 lb (110.678 kg)  04/23/14 244 lb (110.678 kg)  01/20/14 244 lb (110.678 kg)    Body mass index is 31.59 kg/(m^2). Patient meets criteria for obesity based on current BMI.   Current diet order is Heart Healthy. Labs and medications reviewed. Skin WDL.  No nutrition interventions warranted at this time. If nutrition issues arise, please consult RD.     Jarome Matin, RD, LDN Inpatient Clinical Dietitian Pager # [phone] After hours/weekend pager # [phone]

## 2015-09-19 NOTE — ED Notes (Signed)
Patient's medications counted in the ED and taken to pharmacy.

## 2015-09-19 NOTE — ED Notes (Deleted)
Patient's medication counted by Pryor Curia and taken to pharmacy.

## 2015-09-19 NOTE — Progress Notes (Signed)
Echocardiogram 2D Echocardiogram has been performed.  Tresa Res 09/19/2015, 1:03 PM

## 2015-09-19 NOTE — ED Notes (Signed)
PATIENT'S MEDICATIONS ARE BEING SENT TO THE FLOOR WITH PATIENT AND WILL BE GIVEN TO RN.

## 2015-09-20 DIAGNOSIS — I952 Hypotension due to drugs: Secondary | ICD-10-CM

## 2015-09-20 DIAGNOSIS — I10 Essential (primary) hypertension: Secondary | ICD-10-CM

## 2015-09-20 LAB — BASIC METABOLIC PANEL
Anion gap: 7 (ref 5–15)
BUN: 11 mg/dL (ref 6–20)
CALCIUM: 8.9 mg/dL (ref 8.9–10.3)
CO2: 26 mmol/L (ref 22–32)
Chloride: 104 mmol/L (ref 101–111)
Creatinine, Ser: 1.03 mg/dL (ref 0.61–1.24)
GFR calc Af Amer: >60 mL/min (ref >60)
GLUCOSE: 92 mg/dL (ref 65–99)
POTASSIUM: 4.4 mmol/L (ref 3.5–5.1)
Sodium: 137 mmol/L (ref 135–145)

## 2015-09-20 LAB — CBC
HEMATOCRIT: 37.7 % — AB (ref 39.0–52.0)
HEMOGLOBIN: 13.4 g/dL (ref 13.0–17.0)
MCH: 33.8 pg (ref 26.0–34.0)
MCHC: 35.5 g/dL (ref 30.0–36.0)
MCV: 95 fL (ref 78.0–100.0)
Platelets: 124 10*3/uL — ABNORMAL LOW (ref 150–400)
RBC: 3.97 MIL/uL — ABNORMAL LOW (ref 4.22–5.81)
RDW: 13.4 % (ref 11.5–15.5)
WBC: 3.6 10*3/uL — ABNORMAL LOW (ref 4.0–10.5)

## 2015-09-20 LAB — GLUCOSE, CAPILLARY: Glucose-Capillary: 150 mg/dL — ABNORMAL HIGH (ref 65–99)

## 2015-09-20 NOTE — Discharge Summary (Signed)
Physician Discharge Summary  Jonathan Wilson  [id number]  DOB: 02-26-1951  DOA: 09/18/2015  PCP: Arnoldo Morale, MD  Admit date: 09/18/2015 Discharge date: 09/20/2015  Time spent: Greater than 30 minutes  Recommendations for Outpatient Follow-up:  1. Dr. Arnoldo Morale, PCP in one week with repeat labs (CBC & BMP). Please follow final blood culture results that were sent from the hospital.  Discharge Diagnoses:  Principal Problem:   Syncope Active Problems:   Hypotension   Faintness   Discharge Condition: Improved & Stable  Diet recommendation: Heart healthy diet.  Filed Weights   09/19/15 0214  Weight: 116.212 kg (256 lb 3.2 oz)    History of present illness:  65 year old male with history of HTN, HLD, gout, hepatitis C, anxiety, depression, was found down by son at home. He gave history that he only had a beer PTA and denied any illicit drug use. Denied recent illnesses such as fever, vomiting or diarrhea. EMS was called and patient was noted to have oxygen saturations in the 80% and mildly hypotensive. CBG was 110. Initial BPs in the 70s and pinpoint pupils in ED. Patient admitted to taking illicit Xanax from his neighbor. Recently antihypertensives changed from amlodipine to labetalol. Admitted for evaluation and management of syncope.  Hospital Course:   Principal Problem:  Syncope Active Problems:  Hypotension   Syncope - Felt to be either due to overdose of Xanax versus hypotension-possibly medication related (antihypertensives and Xanax). - No arrhythmias on telemetry. No orthostatic blood pressure changes. CT head without acute findings. - 2-D echo results and unremarkable.. - Labetalol discontinued. - Patient counseled regarding abstinence from medication abuse and he verbalized understanding. - Patient has ambulated several times around the floor without any complaints. Denies dizziness, lightheadedness, feeling like passing out, chest pain, dyspnea or  palpitations. - He has been counseled that he should not drive until clearance by M.D. during outpatient follow-up.  Hypotension/essential hypertension - Hypotension resolved. Labetalol discontinued-appears as though he had not taken it for several days prior to admission. Patient on chronic lisinopril/HCTZ at home-continue same. Discontinued amlodipine. Close outpatient follow-up.  Gout - Continue allopurinol. No acute flare.  Anxiety and depression - Stable. Continue paroxetine, trazodone and Risperdal.  Hypothermia - Unclear etiology. Resolved.   Stage III chronic kidney disease - Creatinine in February was 1.06. However in early March it ranged between 1.6-1.8. Admitted with creatinine of 1.49. Creatinine has normalized. Elevated creatinine may have been related to HCTZ/ACEI. Follow BMP as outpatient in a week's time.  Thrombocytopenia - Mild and intermittent and chronic. Stable. Outpatient follow-up.  Hepatitis C - Outpatient follow-up and management as deemed necessary.   Consultants:   None  Procedures:   2-D echo 09/19/15: Study Conclusions  - Left ventricle: The cavity size was normal. Wall thickness was  normal. Systolic function was normal. The estimated ejection  fraction was in the range of 55% to 60%. Wall motion was normal;  there were no regional wall motion abnormalities. Doppler  parameters are consistent with abnormal left ventricular  relaxation (grade 1 diastolic dysfunction). - Mitral valve: Calcified annulus. - Left atrium: The atrium was mildly dilated. - Right ventricle: The cavity size was mildly dilated. Wall  thickness was normal. Systolic function was mildly reduced. - Pulmonary arteries: Systolic pressure was mildly increased. PA  peak pressure: 38 mm Hg (S).  Impressions:  - Compared to the prior study, there has been no significant  interval change.    Discharge Exam:  Complaints:  Denies complaints.  Anxious to go  home. States that he had been on benzodiazepines for 30+ years and was taken off them approximately 15 months ago. He then developed benzodiazepine withdrawal. He has been off of benzodiazepines since. For 2 days prior to admission, he states that he took his friend/neighbor's Xanax for unclear reasons. He currently denies any complaints. Denies dizziness, lightheadedness, chest pain or palpitations or dyspnea.  Filed Vitals:   09/19/15 0649 09/19/15 1427 09/19/15 2057 09/20/15 0542  BP: 118/78 128/70 130/73 153/89  Pulse: 55 55 59 64  Temp: 97.4 F (36.3 C) 97.5 F (36.4 C)  97.5 F (36.4 C)  TempSrc: Oral Oral Oral Oral  Resp:  18 18 18   Height:      Weight:      SpO2:  98% 91% 98%    General exam: Pleasant middle-aged male seen ambulating comfortably in the hallway without assistance but under supervision by nursing staff. Respiratory system: Clear to auscultation. Respiratory effort normal. Cardiovascular system: S1 & S2 heard, RRR. No JVD, murmurs, rubs, gallops or clicks. No pedal edema. Telemetry: SB 50s-SR. Gastrointestinal system: Abdomen is nondistended, soft and nontender. No organomegaly or masses felt. Normal bowel sounds heard. Central nervous system: Alert and oriented. No focal neurological deficits. Extremities: Symmetric 5 x 5 power. Skin: No rashes, lesions or ulcers Psychiatry: Judgement and insight appear normal. Mood & affect appropriate.   Discharge Instructions      Discharge Instructions    Call MD for:  difficulty breathing, headache or visual disturbances    Complete by:  As directed      Call MD for:  extreme fatigue    Complete by:  As directed      Call MD for:  persistant dizziness or light-headedness    Complete by:  As directed      Call MD for:  persistant nausea and vomiting    Complete by:  As directed      Call MD for:  severe uncontrolled pain    Complete by:  As directed      Call MD for:  temperature >100.4    Complete by:  As directed       Call MD for:    Complete by:  As directed   Passing out.     Diet - low sodium heart healthy    Complete by:  As directed      Driving Restrictions    Complete by:  As directed   Do not drive until clearance by your physician.     Increase activity slowly    Complete by:  As directed             Medication List    STOP taking these medications        amLODipine 10 MG tablet  Commonly known as:  NORVASC     labetalol 200 MG tablet  Commonly known as:  NORMODYNE      TAKE these medications        albuterol 108 (90 Base) MCG/ACT inhaler  Commonly known as:  PROVENTIL HFA;VENTOLIN HFA  Inhale 2-4 puffs into the lungs every 4 (four) hours as needed for wheezing or shortness of breath.     allopurinol 300 MG tablet  Commonly known as:  ZYLOPRIM  Take 1 tablet (300 mg total) by mouth every morning.     lisinopril-hydrochlorothiazide 20-12.5 MG tablet  Commonly known as:  ZESTORETIC  Take 2 tablets by mouth daily.     PARoxetine 30 MG  tablet  Commonly known as:  PAXIL  Take 30 mg by mouth every morning.     risperiDONE 1 MG tablet  Commonly known as:  RISPERDAL  Take 1 mg by mouth 2 (two) times daily.     traZODone 50 MG tablet  Commonly known as:  DESYREL  Take 50 mg by mouth at bedtime.       Follow-up Information    Follow up with Arnoldo Morale, MD. Schedule an appointment as soon as possible for a visit in 1 week.   Specialty:  Family Medicine   Why:  To be seen with repeat labs (CBC & BMP).   Contact information:   [place] [address] [phone]       Get Medicines reviewed and adjusted: Please take all your medications with you for your next visit with your Primary MD  Please request your Primary MD to go over all hospital tests and procedure/radiological results at the follow up. Please ask your Primary MD to get all Hospital records sent to his/her office.  If you experience worsening of your admission symptoms,  develop shortness of breath, life threatening emergency, suicidal or homicidal thoughts you must seek medical attention immediately by calling 911 or calling your MD immediately if symptoms less severe.  You must read complete instructions/literature along with all the possible adverse reactions/side effects for all the Medicines you take and that have been prescribed to you. Take any new Medicines after you have completely understood and accept all the possible adverse reactions/side effects.   Do not drive when taking pain medications.   Do not take more than prescribed Pain, Sleep and Anxiety Medications  Special Instructions: If you have smoked or chewed Tobacco in the last 2 yrs please stop smoking, stop any regular Alcohol and or any Recreational drug use.  Wear Seat belts while driving.  Please note  You were cared for by a hospitalist during your hospital stay. Once you are discharged, your primary care physician will handle any further medical issues. Please note that NO REFILLS for any discharge medications will be authorized once you are discharged, as it is imperative that you return to your primary care physician (or establish a relationship with a primary care physician if you do not have one) for your aftercare needs so that they can reassess your need for medications and monitor your lab values.    The results of significant diagnostics from this hospitalization (including imaging, microbiology, ancillary and laboratory) are listed below for reference.    Significant Diagnostic Studies: Ct Head Wo Contrast  09/18/2015  CLINICAL DATA:  Altered mental status. Patient was found on the floor and was difficult to arouse. EXAM: CT HEAD WITHOUT CONTRAST TECHNIQUE: Contiguous axial images were obtained from the base of the skull through the vertex without intravenous contrast. COMPARISON:  06/03/2006 FINDINGS: Mild cerebral atrophy. No ventricular dilatation. Small patchy areas of  low-attenuation in the deep white matter are nonspecific but likely represent small vessel ischemic changes. No mass effect or midline shift. No abnormal extra-axial fluid collections. Gray-white matter junctions are distinct. Basal cisterns are not effaced. No evidence of acute intracranial hemorrhage. No depressed skull fractures. Mucosal thickening in the left maxillary antrum. No acute air-fluid levels. Mastoid air cells are not opacified. Old nasal bone deformities. Vascular calcifications. IMPRESSION: No acute intracranial abnormalities. Mild atrophy and small vessel ischemic changes. Electronically Signed   By: Lucienne Capers M.D.   On: 09/18/2015 23:57   Dg  Chest Port 1 View  09/18/2015  CLINICAL DATA:  Shortness of breath. Patient was found down on the floor, difficult to arouse. Pinpoint pupils. EXAM: PORTABLE CHEST 1 VIEW COMPARISON:  08/29/2014 FINDINGS: Slight fibrosis in the lung bases. Normal heart size and pulmonary vascularity. No focal airspace disease or consolidation in the lungs. No blunting of costophrenic angles. No pneumothorax. Mediastinal contours appear intact. Calcification of the aorta. IMPRESSION: No active disease. Electronically Signed   By: Lucienne Capers M.D.   On: 09/18/2015 21:57    Microbiology: Recent Results (from the past 240 hour(s))  Culture, blood (Routine X 2) w Reflex to ID Panel     Status: None (Preliminary result)   Collection Time: 09/18/15 10:17 PM  Result Value Ref Range Status   Specimen Description BLOOD RIGHT HAND  Final   Special Requests BOTTLES DRAWN AEROBIC AND ANAEROBIC 5 CC  Final   Culture   Final    NO GROWTH 1 DAY Performed at [facility]    Report Status PENDING  Incomplete  Culture, blood (Routine X 2) w Reflex to ID Panel     Status: None (Preliminary result)   Collection Time: 09/18/15 10:29 PM  Result Value Ref Range Status   Specimen Description BLOOD RIGHT WRIST  Final   Special Requests IN PEDIATRIC BOTTLE 3CC   Final   Culture   Final    NO GROWTH 1 DAY Performed at [facility]    Report Status PENDING  Incomplete     Labs: Basic Metabolic Panel:  Recent Labs Lab 09/18/15 2217 09/20/15 0454  NA 138 137  K 3.9 4.4  CL 104 104  CO2 26 26  GLUCOSE 95 92  BUN 19 11  CREATININE 1.49* 1.03  CALCIUM 9.1 8.9   Liver Function Tests:  Recent Labs Lab 09/18/15 2217  AST 82*  ALT 55  ALKPHOS 75  BILITOT 1.0  PROT 7.1  ALBUMIN 4.2   No results for input(s): LIPASE, AMYLASE in the last 168 hours. No results for input(s): AMMONIA in the last 168 hours. CBC:  Recent Labs Lab 09/18/15 2217 09/20/15 0454  WBC 4.8 3.6*  NEUTROABS 2.8  --   HGB 14.1 13.4  HCT 38.6* 37.7*  MCV 93.2 95.0  PLT 123* 124*   Cardiac Enzymes: No results for input(s): CKTOTAL, CKMB, CKMBINDEX, TROPONINI in the last 168 hours. BNP: BNP (last 3 results) No results for input(s): BNP in the last 8760 hours.  ProBNP (last 3 results) No results for input(s): PROBNP in the last 8760 hours.  CBG:  Recent Labs Lab 09/18/15 2216 09/19/15 0807 09/20/15 0549  GLUCAP 86 97 150*       Signed:  Vernell Leep, MD, FACP, FHM. [facility] Pager [phone]  If 7PM-7AM, please contact night-coverage [email or web address] Password [email or web address] 09/20/2015, 2:51 PM

## 2015-09-20 NOTE — Discharge Instructions (Signed)

## 2015-09-24 LAB — CULTURE, BLOOD (ROUTINE X 2)
CULTURE: NO GROWTH
Culture: NO GROWTH

## 2015-10-05 ENCOUNTER — Inpatient Hospital Stay: Payer: Self-pay | Admitting: Family Medicine

## 2015-10-06 MED FILL — LISINOPRIL-HCTZ 20-12.5 MG: 20-12.5 | 30 days supply | Qty: 60 | Fill #2

## 2015-10-13 ENCOUNTER — Emergency Department (HOSPITAL_COMMUNITY): Payer: Medicaid Other

## 2015-10-13 ENCOUNTER — Encounter (HOSPITAL_COMMUNITY): Payer: Self-pay | Admitting: Emergency Medicine

## 2015-10-13 ENCOUNTER — Emergency Department (HOSPITAL_COMMUNITY)
Admission: EM | Admit: 2015-10-13 | Discharge: 2015-10-13 | Disposition: A | Discharge status: Home / Self Care | Payer: Medicaid Other | Attending: Emergency Medicine | Admitting: Emergency Medicine

## 2015-10-13 DIAGNOSIS — Z8719 Personal history of other diseases of the digestive system: Secondary | ICD-10-CM | POA: Insufficient documentation

## 2015-10-13 DIAGNOSIS — I10 Essential (primary) hypertension: Secondary | ICD-10-CM | POA: Insufficient documentation

## 2015-10-13 DIAGNOSIS — F419 Anxiety disorder, unspecified: Secondary | ICD-10-CM | POA: Diagnosis not present

## 2015-10-13 DIAGNOSIS — F329 Major depressive disorder, single episode, unspecified: Secondary | ICD-10-CM | POA: Diagnosis not present

## 2015-10-13 DIAGNOSIS — F17210 Nicotine dependence, cigarettes, uncomplicated: Secondary | ICD-10-CM | POA: Insufficient documentation

## 2015-10-13 DIAGNOSIS — E669 Obesity, unspecified: Secondary | ICD-10-CM | POA: Insufficient documentation

## 2015-10-13 DIAGNOSIS — J159 Unspecified bacterial pneumonia: Secondary | ICD-10-CM | POA: Diagnosis not present

## 2015-10-13 DIAGNOSIS — M109 Gout, unspecified: Secondary | ICD-10-CM | POA: Insufficient documentation

## 2015-10-13 DIAGNOSIS — Z8619 Personal history of other infectious and parasitic diseases: Secondary | ICD-10-CM | POA: Diagnosis not present

## 2015-10-13 DIAGNOSIS — R05 Cough: Secondary | ICD-10-CM | POA: Diagnosis present

## 2015-10-13 DIAGNOSIS — J189 Pneumonia, unspecified organism: Secondary | ICD-10-CM

## 2015-10-13 LAB — COMPREHENSIVE METABOLIC PANEL
ALT: 28 U/L (ref 17–63)
AST: 28 U/L (ref 15–41)
Albumin: 4 g/dL (ref 3.5–5.0)
Alkaline Phosphatase: 78 U/L (ref 38–126)
Anion gap: 15 (ref 5–15)
BILIRUBIN TOTAL: 1.3 mg/dL — AB (ref 0.3–1.2)
BUN: 17 mg/dL (ref 6–20)
CALCIUM: 9.6 mg/dL (ref 8.9–10.3)
CHLORIDE: 94 mmol/L — AB (ref 101–111)
CO2: 32 mmol/L (ref 22–32)
CREATININE: 1.08 mg/dL (ref 0.61–1.24)
Glucose, Bld: 108 mg/dL — ABNORMAL HIGH (ref 65–99)
Potassium: 3.6 mmol/L (ref 3.5–5.1)
Sodium: 141 mmol/L (ref 135–145)
TOTAL PROTEIN: 7.7 g/dL (ref 6.5–8.1)

## 2015-10-13 LAB — CBC WITH DIFFERENTIAL/PLATELET
BASOS ABS: 0 10*3/uL (ref 0.0–0.1)
BASOS PCT: 0 %
EOS PCT: 1 %
Eosinophils Absolute: 0.1 10*3/uL (ref 0.0–0.7)
HCT: 42.4 % (ref 39.0–52.0)
Hemoglobin: 15 g/dL (ref 13.0–17.0)
Lymphocytes Relative: 14 %
Lymphs Abs: 1 10*3/uL (ref 0.7–4.0)
MCH: 33 pg (ref 26.0–34.0)
MCHC: 35.4 g/dL (ref 30.0–36.0)
MCV: 93.4 fL (ref 78.0–100.0)
MONO ABS: 0.8 10*3/uL (ref 0.1–1.0)
MONOS PCT: 11 %
Neutro Abs: 5.3 10*3/uL (ref 1.7–7.7)
Neutrophils Relative %: 74 %
PLATELETS: 202 10*3/uL (ref 150–400)
RBC: 4.54 MIL/uL (ref 4.22–5.81)
RDW: 12.5 % (ref 11.5–15.5)
WBC: 7.3 10*3/uL (ref 4.0–10.5)

## 2015-10-13 MED ORDER — IPRATROPIUM-ALBUTEROL 0.5-2.5 (3) MG/3ML IN SOLN
3.0000 mL | Freq: Once | RESPIRATORY_TRACT | Status: AC
  Administered 2015-10-13: 3 mL via RESPIRATORY_TRACT
  Filled 2015-10-13: qty 3

## 2015-10-13 MED ORDER — PREDNISONE 20 MG PO TABS: 20.0000 mg | ORAL_TABLET | Freq: Every day | ORAL | Status: DC

## 2015-10-13 MED ORDER — ALBUTEROL SULFATE (2.5 MG/3ML) 0.083% IN NEBU
2.5000 mg | INHALATION_SOLUTION | Freq: Once | RESPIRATORY_TRACT | Status: AC
  Administered 2015-10-13: 2.5 mg via RESPIRATORY_TRACT
  Filled 2015-10-13: qty 3

## 2015-10-13 MED ORDER — ALBUTEROL SULFATE HFA 108 (90 BASE) MCG/ACT IN AERS
2.0000 | INHALATION_SPRAY | RESPIRATORY_TRACT | Status: DC | PRN
  Administered 2015-10-13: 2 via RESPIRATORY_TRACT
  Filled 2015-10-13: qty 6.7

## 2015-10-13 MED ORDER — AZITHROMYCIN 250 MG PO TABS
500.0000 mg | ORAL_TABLET | Freq: Once | ORAL | Status: AC
  Administered 2015-10-13: 500 mg via ORAL
  Filled 2015-10-13: qty 2

## 2015-10-13 MED ORDER — AZITHROMYCIN 250 MG PO TABS: 250.0000 mg | ORAL_TABLET | Freq: Every day | ORAL | Status: DC

## 2015-10-13 MED ORDER — PREDNISONE 20 MG PO TABS
60.0000 mg | ORAL_TABLET | Freq: Once | ORAL | Status: AC
  Administered 2015-10-13: 60 mg via ORAL
  Filled 2015-10-13: qty 3

## 2015-10-13 NOTE — ED Notes (Signed)
Pt reports productive cough x 5 days

## 2015-10-13 NOTE — ED Notes (Signed)
Pt stated he would prefer not to have dc vital signs taken. Pt ambulatory at time of dc

## 2015-10-13 NOTE — ED Notes (Signed)
RT contacted to start neb tx 

## 2015-10-13 NOTE — Discharge Instructions (Signed)
Follow-up with your family doctor Monday as planned

## 2015-10-13 NOTE — ED Notes (Signed)
Patient transported to X-ray 

## 2015-10-13 NOTE — ED Notes (Signed)
Pt states that he has been having productive cough x 4 days.  Denies chest pain.  Pt thinks he might have pna.

## 2015-10-13 NOTE — ED Provider Notes (Signed)
CSN: [id number]     Arrival date & time 10/13/15  1406 History   First MD Initiated Contact with Patient 10/13/15 1433     Chief Complaint  Patient presents with  . Cough     (Consider location/radiation/quality/duration/timing/severity/associated sxs/prior Treatment) Patient is a 65 y.o. male presenting with cough. The history is provided by the patient (Patient complains of cough and congestion for a few days).  Cough Cough characteristics:  Productive Severity:  Moderate Onset quality:  Sudden Timing:  Constant Progression:  Unchanged Chronicity:  New Context: animal exposure   Associated symptoms: wheezing   Associated symptoms: no chest pain, no eye discharge, no headaches and no rash     Past Medical History  Diagnosis Date  . Pneumonia     recurrent episodes   . Hypertension   . Hyperlipidemia   . Gout   . Vitamin D deficiency   . Hepatitis C before 09/23/1998  . Obesity     300 # in 09-22-08, BMI 34 in 04/2013.   . Right lower lobe lung mass 05/2008    resolved on follow up CT 23-Sep-2010.   [name] Psychosis 1998/09/23    s/e of treatment for Hepatitis C.    . Influenza A 06/2013    acute resp failure, did not require intubation.  . Diverticulosis of colon 09/22/2009    seen on CT scan in 09/22/2009.   [name] Anxiety and depression 09/23/98    worse after death of wife 03/24/13  . Anxiety   . Depression    Past Surgical History  Procedure Laterality Date  . Mandible fracture surgery    . Cholecystectomy N/A 01/14/2014    Procedure: LAPAROSCOPIC CHOLECYSTECTOMY WITH INTRAOPERATIVE CHOLANGIOGRAM;  Surgeon: Shann Medal, MD;  Location: [facility];  Service: General;  Laterality: N/A;  . Robotic assited partial nephrectomy Right 08/07/2015    Procedure: XI ROBOTIC ASSISTED RIGHT PARTIAL NEPHRECTOMY;  Surgeon: Cleon Gustin, MD;  Location: [facility];  Service: Urology;  Laterality: Right;   Family History  Problem Relation Age of Onset  . Diabetes Mother   . Arthritis Mother   . Cancer Father     pancreatic  cancer   Social History  Substance Use Topics  . Smoking status: Current Every Day Smoker -- 1.00 packs/day for 40 years    Types: Cigarettes  . Smokeless tobacco: Never Used  . Alcohol Use: 0.6 oz/week    1 Cans of beer per week     Comment: hx of etoh abuse- 12 years ago     Review of Systems  Constitutional: Negative for appetite change and fatigue.  HENT: Negative for congestion, ear discharge and sinus pressure.   Eyes: Negative for discharge.  Respiratory: Positive for cough and wheezing.   Cardiovascular: Negative for chest pain.  Gastrointestinal: Negative for abdominal pain and diarrhea.  Genitourinary: Negative for frequency and hematuria.  Musculoskeletal: Negative for back pain.  Skin: Negative for rash.  Neurological: Negative for seizures and headaches.  Psychiatric/Behavioral: Negative for hallucinations.      Allergies  Review of patient's allergies indicates no known allergies.  Home Medications   Prior to Admission medications   Medication Sig Start Date End Date Taking? Authorizing Provider  albuterol (PROVENTIL HFA;VENTOLIN HFA) 108 (90 Base) MCG/ACT inhaler Inhale 2-4 puffs into the lungs every 4 (four) hours as needed for wheezing or shortness of breath. 07/25/15  Yes Arnoldo Morale, MD  allopurinol (ZYLOPRIM) 300 MG tablet Take 1 tablet (300 mg total) by mouth  every morning. 05/24/15  Yes Arnoldo Morale, MD  lisinopril-hydrochlorothiazide (ZESTORETIC) 20-12.5 MG tablet Take 2 tablets by mouth daily. 07/25/15  Yes Arnoldo Morale, MD  PARoxetine (PAXIL) 30 MG tablet Take 30 mg by mouth every morning.   Yes Historical Provider, MD  risperiDONE (RISPERDAL) 1 MG tablet Take 1 mg by mouth 2 (two) times daily.   Yes Historical Provider, MD  traZODone (DESYREL) 50 MG tablet Take 50 mg by mouth at bedtime.   Yes Historical Provider, MD  amLODipine (NORVASC) 10 MG tablet Take 10 mg by mouth daily. Reported on 10/13/2015 08/23/15   Historical Provider, MD  azithromycin  (ZITHROMAX Z-PAK) 250 MG tablet Take 1 tablet (250 mg total) by mouth daily. 10/13/15   Milton Ferguson, MD  labetalol (NORMODYNE) 200 MG tablet Take 200 mg by mouth 2 (two) times daily. Reported on 10/13/2015 08/03/15   Historical Provider, MD  predniSONE (DELTASONE) 20 MG tablet Take 1 tablet (20 mg total) by mouth daily. 10/13/15   Milton Ferguson, MD   BP 138/87 mmHg  Pulse 86  Temp(Src) 98.3 F (36.8 C) (Oral)  Resp 18  SpO2 97% Physical Exam  Constitutional: He is oriented to person, place, and time. He appears well-developed.  HENT:  Head: Normocephalic.  Eyes: Conjunctivae and EOM are normal. No scleral icterus.  Neck: Neck supple. No thyromegaly present.  Cardiovascular: Normal rate and regular rhythm.  Exam reveals no gallop and no friction rub.   No murmur heard. Pulmonary/Chest: No stridor. He has wheezes. He has no rales. He exhibits no tenderness.  Abdominal: He exhibits no distension. There is no tenderness. There is no rebound.  Musculoskeletal: Normal range of motion. He exhibits no edema.  Lymphadenopathy:    He has no cervical adenopathy.  Neurological: He is oriented to person, place, and time. He exhibits normal muscle tone. Coordination normal.  Skin: No rash noted. No erythema.  Psychiatric: He has a normal mood and affect. His behavior is normal.    ED Course  Procedures (including critical care time) Labs Review Labs Reviewed  COMPREHENSIVE METABOLIC PANEL - Abnormal; Notable for the following:    Chloride 94 (*)    Glucose, Bld 108 (*)    Total Bilirubin 1.3 (*)    All other components within normal limits  CBC WITH DIFFERENTIAL/PLATELET    Imaging Review Dg Chest 2 View  10/13/2015  CLINICAL DATA:  Shortness breath and cough, several days duration. Smoking history. EXAM: CHEST  2 VIEW COMPARISON:  09/18/2015 and previous FINDINGS: Heart size is normal. There is atherosclerosis of the aorta. There is patchy infiltrate and volume loss in the lingula. There is  central bronchial thickening. No lobar collapse. No effusions. No bony abnormalities. IMPRESSION: Bronchial thickening. Infiltrate and volume loss in the lingula consistent with pneumonia. Electronically Signed   By: Nelson Chimes M.D.   On: 10/13/2015 15:19   I have personally reviewed and evaluated these images and lab results as part of my medical decision-making.   EKG Interpretation None      MDM   Final diagnoses:  Community acquired pneumonia    Patient has pneumonia on chest x-ray. Patient nontoxic. Patient's symptoms improved with neb treatment. He will be sent home on Zithromax Proventil and prednisone. Patient will follow-up with his doctor in 3 days    Milton Ferguson, MD 10/13/15 1642

## 2015-10-25 ENCOUNTER — Encounter: Payer: Self-pay | Admitting: Family Medicine

## 2015-10-25 ENCOUNTER — Ambulatory Visit: Payer: Medicaid Other | Attending: Family Medicine | Admitting: Family Medicine

## 2015-10-25 VITALS — BP 123/81 | HR 89 | Temp 97.8°F | Resp 16 | Ht 75.0 in | Wt 241.2 lb

## 2015-10-25 DIAGNOSIS — Z79899 Other long term (current) drug therapy: Secondary | ICD-10-CM | POA: Diagnosis not present

## 2015-10-25 DIAGNOSIS — J42 Unspecified chronic bronchitis: Secondary | ICD-10-CM

## 2015-10-25 DIAGNOSIS — R05 Cough: Secondary | ICD-10-CM | POA: Insufficient documentation

## 2015-10-25 DIAGNOSIS — I10 Essential (primary) hypertension: Secondary | ICD-10-CM

## 2015-10-25 DIAGNOSIS — R059 Cough, unspecified: Secondary | ICD-10-CM

## 2015-10-25 MED ORDER — LISINOPRIL-HYDROCHLOROTHIAZIDE 20-12.5 MG PO TABS: 2.0000 | ORAL_TABLET | Freq: Every day | ORAL | Status: DC

## 2015-10-25 MED ORDER — BENZONATATE 100 MG PO CAPS: 100.0000 mg | ORAL_CAPSULE | Freq: Three times a day (TID) | ORAL | Status: DC

## 2015-10-25 MED ORDER — CETIRIZINE HCL 10 MG PO TABS: 10.0000 mg | ORAL_TABLET | Freq: Every day | ORAL | Status: DC

## 2015-10-25 MED FILL — ALL DAY ALLERGY 10 MG TAB: 10 | 30 days supply | Qty: 30 | Fill #0

## 2015-10-25 MED FILL — BENZONATATE 100 MG CAPSULE: 100 | 5 days supply | Qty: 15 | Fill #0

## 2015-10-25 NOTE — Progress Notes (Signed)
Subjective:  Patient ID: Jonathan Wilson, male    DOB: Jul 31, 1950  Age: 65 y.o. MRN: [id number]  CC: Hospitalization Follow-up   HPI Jonathan Wilson is a 65 year old male with a history of hypertension, COPD, gout, right renal mass (status post partial resection) who comes into the clinic after an ED visit in which he was treated for pneumonia on 10/13/15 (CXR revealed bronchial thickening, infiltrate and volume loss in the lingula consistent with pneumonia) He was seen at [facility] ED where he received azithromycin, Proventil inhaler and prednisone for complaints cough is still persistent with production of whitish sputum associated with chest pain when he coughs. He also endorses postnasal drip and frontal sinus pressure and headaches. Denies fevers.  Prior to that he had a hospitalization last month where he was found to be hypotensive secondary to ?Overmedication versus medication overdose with Xanax which she was receiving from her navel. Today he informs me he is only taking lisinopril/HCTZ but has Labetaloland Amlodipine on his med list which he is not taking. During an office visit in March he had discontinued amlodipine due to a possible allergy which had manifested in flushing of the face and arms and placed him on Labetalol but it seems amlodipine was restarted during one of his hospitalizations.  He continues to smoke every now and then.  He does have a history of bipolar disorder and is supposed to be followed by psychiatry at [facility] but has been noncompliant with this because they wouldn't give him Xanax which he has been taking since he was 21.  Outpatient Prescriptions Prior to Visit  Medication Sig Dispense Refill  . albuterol (PROVENTIL HFA;VENTOLIN HFA) 108 (90 Base) MCG/ACT inhaler Inhale 2-4 puffs into the lungs every 4 (four) hours as needed for wheezing or shortness of breath. 1 Inhaler 3  . lisinopril-hydrochlorothiazide (ZESTORETIC) 20-12.5 MG tablet Take 2 tablets by  mouth daily. 60 tablet 3  . allopurinol (ZYLOPRIM) 300 MG tablet Take 1 tablet (300 mg total) by mouth every morning. (Patient not taking: Reported on 10/25/2015) 30 tablet 2  . labetalol (NORMODYNE) 200 MG tablet Take 200 mg by mouth 2 (two) times daily. Reported on 10/25/2015  2  . PARoxetine (PAXIL) 30 MG tablet Take 30 mg by mouth every morning. Reported on 10/25/2015    . risperiDONE (RISPERDAL) 1 MG tablet Take 1 mg by mouth 2 (two) times daily. Reported on 10/25/2015    . traZODone (DESYREL) 50 MG tablet Take 50 mg by mouth at bedtime. Reported on 10/25/2015    . amLODipine (NORVASC) 10 MG tablet Take 10 mg by mouth daily. Reported on 10/25/2015  3  . azithromycin (ZITHROMAX Z-PAK) 250 MG tablet Take 1 tablet (250 mg total) by mouth daily. (Patient not taking: Reported on 10/25/2015) 4 tablet 0  . predniSONE (DELTASONE) 20 MG tablet Take 1 tablet (20 mg total) by mouth daily. (Patient not taking: Reported on 10/25/2015) 5 tablet 0   No facility-administered medications prior to visit.    ROS Review of Systems  Constitutional: Negative for activity change and appetite change.  HENT: Negative for sinus pressure and sore throat.   Respiratory: Negative for chest tightness, shortness of breath and wheezing.   Cardiovascular: Negative for chest pain and palpitations.  Gastrointestinal: Negative for abdominal pain, constipation and abdominal distention.  Genitourinary: Negative.   Musculoskeletal: Negative.   Psychiatric/Behavioral: Negative for behavioral problems and dysphoric mood.    Objective:  BP 123/81 mmHg  Pulse 89  Temp(Src) 97.8  F (36.6 C) (Oral)  Resp 16  Ht 6\' 3"  (1.905 m)  Wt 241 lb 3.2 oz (109.408 kg)  BMI 30.15 kg/m2  SpO2 96%  BP/Weight 10/25/2015 10/13/2015 [id number]  Systolic BP [id number]  Diastolic BP 81 87 89  Wt. (Lbs) 241.2 - -  BMI 30.15 - -      Physical Exam  Constitutional: He is oriented to person, place, and time. He appears well-developed and  well-nourished.  Cardiovascular: Normal rate, normal heart sounds and intact distal pulses.   No murmur heard. Pulmonary/Chest: Effort normal and breath sounds normal. He has no wheezes. He has no rales. He exhibits no tenderness.  Abdominal: Soft. Bowel sounds are normal. He exhibits no distension and no mass. There is no tenderness.  Musculoskeletal: Normal range of motion.  Neurological: He is alert and oriented to person, place, and time.     Assessment & Plan:   1. Essential hypertension Controlled Advised to hold off on taking labetalol which will be discontinued at his next visit if blood pressure remains controlled We'll reassess blood pressure at next visit to determine if control be achieved with one antihypertensive - lisinopril-hydrochlorothiazide (ZESTORETIC) 20-12.5 MG tablet; Take 2 tablets by mouth daily.  Dispense: 60 tablet; Refill: 3  2. Chronic bronchitis, unspecified chronic bronchitis type ([place]) Currently on Proventil inhaler alone If cough persist I will upgrade his treatment of COPD  3. Cough Recently treated for pneumonia. Chest exam is clear We'll treat for possible seasonal allergies. - benzonatate (TESSALON) 100 MG capsule; Take 1 capsule (100 mg total) by mouth 3 (three) times daily.  Dispense: 15 capsule; Refill: 0 - cetirizine (ZYRTEC) 10 MG tablet; Take 1 tablet (10 mg total) by mouth daily.  Dispense: 30 tablet; Refill: 3   Meds ordered this encounter  Medications  . lisinopril-hydrochlorothiazide (ZESTORETIC) 20-12.5 MG tablet    Sig: Take 2 tablets by mouth daily.    Dispense:  60 tablet    Refill:  3  . benzonatate (TESSALON) 100 MG capsule    Sig: Take 1 capsule (100 mg total) by mouth 3 (three) times daily.    Dispense:  15 capsule    Refill:  0  . cetirizine (ZYRTEC) 10 MG tablet    Sig: Take 1 tablet (10 mg total) by mouth daily.    Dispense:  30 tablet    Refill:  3    Follow-up: Return in about 1 week (around 11/01/2015) for  follow up of Hypertension and cough.   Arnoldo Morale MD

## 2015-10-25 NOTE — Patient Instructions (Signed)
Hypertension Hypertension, commonly called high blood pressure, is when the force of blood pumping through your arteries is too strong. Your arteries are the blood vessels that carry blood from your heart throughout your body. A blood pressure reading consists of a higher number over a lower number, such as 110/72. The higher number (systolic) is the pressure inside your arteries when your heart pumps. The lower number (diastolic) is the pressure inside your arteries when your heart relaxes. Ideally you want your blood pressure below 120/80. Hypertension forces your heart to work harder to pump blood. Your arteries may become narrow or stiff. Having untreated or uncontrolled hypertension can cause heart attack, stroke, kidney disease, and other problems. RISK FACTORS Some risk factors for high blood pressure are controllable. Others are not.  Risk factors you cannot control include:   Race. You may be at higher risk if you are African American.  Age. Risk increases with age.  Gender. Men are at higher risk than women before age 45 years. After age 65, women are at higher risk than men. Risk factors you can control include:  Not getting enough exercise or physical activity.  Being overweight.  Getting too much fat, sugar, calories, or salt in your diet.  Drinking too much alcohol. SIGNS AND SYMPTOMS Hypertension does not usually cause signs or symptoms. Extremely high blood pressure (hypertensive crisis) may cause headache, anxiety, shortness of breath, and nosebleed. DIAGNOSIS To check if you have hypertension, your health care provider will measure your blood pressure while you are seated, with your arm held at the level of your heart. It should be measured at least twice using the same arm. Certain conditions can cause a difference in blood pressure between your right and left arms. A blood pressure reading that is higher than normal on one occasion does not mean that you need treatment. If  it is not clear whether you have high blood pressure, you may be asked to return on a different day to have your blood pressure checked again. Or, you may be asked to monitor your blood pressure at home for 1 or more weeks. TREATMENT Treating high blood pressure includes making lifestyle changes and possibly taking medicine. Living a healthy lifestyle can help lower high blood pressure. You may need to change some of your habits. Lifestyle changes may include:  Following the DASH diet. This diet is high in fruits, vegetables, and whole grains. It is low in salt, red meat, and added sugars.  Keep your sodium intake below 2,300 mg per day.  Getting at least 30-45 minutes of aerobic exercise at least 4 times per week.  Losing weight if necessary.  Not smoking.  Limiting alcoholic beverages.  Learning ways to reduce stress. Your health care provider may prescribe medicine if lifestyle changes are not enough to get your blood pressure under control, and if one of the following is true:  You are 18-59 years of age and your systolic blood pressure is above 140.  You are 60 years of age or older, and your systolic blood pressure is above 150.  Your diastolic blood pressure is above 90.  You have diabetes, and your systolic blood pressure is over 140 or your diastolic blood pressure is over 90.  You have kidney disease and your blood pressure is above 140/90.  You have heart disease and your blood pressure is above 140/90. Your personal target blood pressure may vary depending on your medical conditions, your age, and other factors. HOME CARE INSTRUCTIONS    Have your blood pressure rechecked as directed by your health care provider.   Take medicines only as directed by your health care provider. Follow the directions carefully. Blood pressure medicines must be taken as prescribed. The medicine does not work as well when you skip doses. Skipping doses also puts you at risk for  problems.  Do not smoke.   Monitor your blood pressure at home as directed by your health care provider. SEEK MEDICAL CARE IF:   You think you are having a reaction to medicines taken.  You have recurrent headaches or feel dizzy.  You have swelling in your ankles.  You have trouble with your vision. SEEK IMMEDIATE MEDICAL CARE IF:  You develop a severe headache or confusion.  You have unusual weakness, numbness, or feel faint.  You have severe chest or abdominal pain.  You vomit repeatedly.  You have trouble breathing. MAKE SURE YOU:   Understand these instructions.  Will watch your condition.  Will get help right away if you are not doing well or get worse.   This information is not intended to replace advice given to you by your health care provider. Make sure you discuss any questions you have with your health care provider.   Document Released: 05/20/2005 Document Revised: 10/04/2014 Document Reviewed: 03/12/2013 Elsevier Interactive Patient Education 2016 [employer] Inc.  

## 2015-10-25 NOTE — Progress Notes (Signed)
Pt here for ED F/U for BP and pneumonia. Pt reports pain in chest and head due to the pneumonia and coughing. Pt rates pain at a 6 and states it is worse when he coughs. Pain has been present for a few weeks. Pt states he is only taking lisinopril and has taken it today.

## 2015-11-01 MED FILL — AMLODIPINE BESYLATE 10 MG T: 10 | 30 days supply | Qty: 30 | Fill #2

## 2015-11-01 MED FILL — LISINOPRIL-HCTZ 20-12.5 MG: 20-12.5 | 30 days supply | Qty: 60 | Fill #3 | Status: TO

## 2015-11-08 ENCOUNTER — Ambulatory Visit: Payer: Self-pay | Admitting: Family Medicine

## 2015-11-10 ENCOUNTER — Ambulatory Visit: Payer: Self-pay | Admitting: Family Medicine

## 2015-12-04 IMAGING — CT CT ABD-PELV W/ CM
2 of 5 series · 16 of 46 positions shown, 18 images · IV contrast (OMNIPAQUE)
Comparison: CT of the abdomen and pelvis 01/16/2014.

CLINICAL DATA: Right upper quadrant abdominal pain.

EXAM:
CT ABDOMEN AND PELVIS WITH CONTRAST
TECHNIQUE: Multidetector CT imaging of the abdomen and pelvis was performed
using the standard protocol following bolus administration of
intravenous contrast.
CONTRAST:  75mL OMNIPAQUE IOHEXOL 300 MG/ML  SOLN

[Series 2: rtn a/p with · axial · 0.92mm/px · z∈[-511,-86]mm · 13 of 99 slices shown, 15 images]
[im 7/99  soft-tissue]
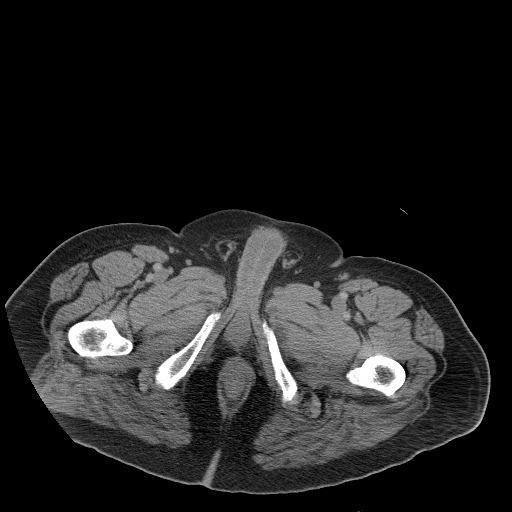
[im 7/99  bone]
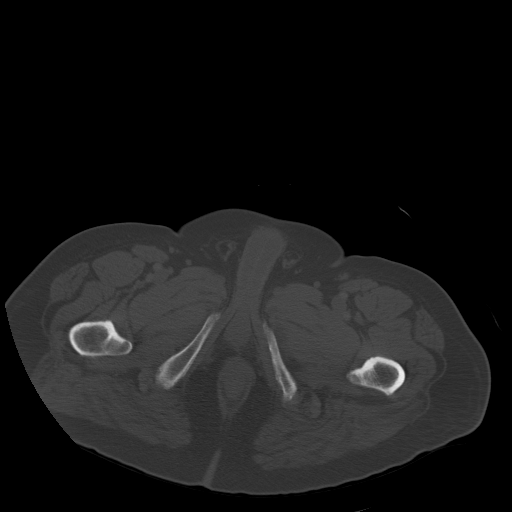
[im 13/99  soft-tissue]
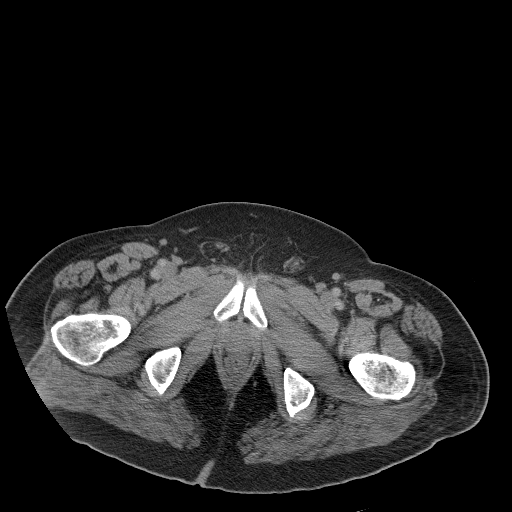
[im 19/99  soft-tissue]
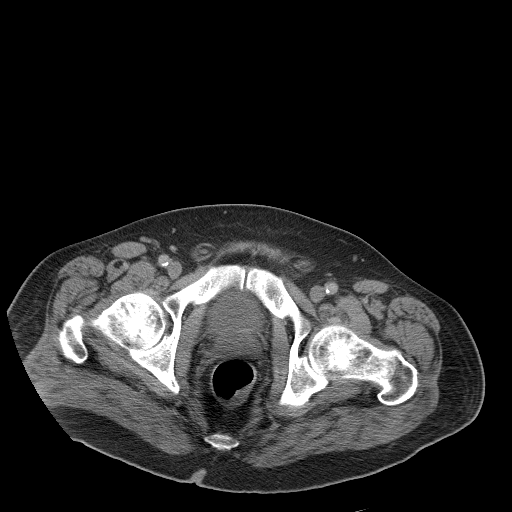
[im 31/99  soft-tissue]
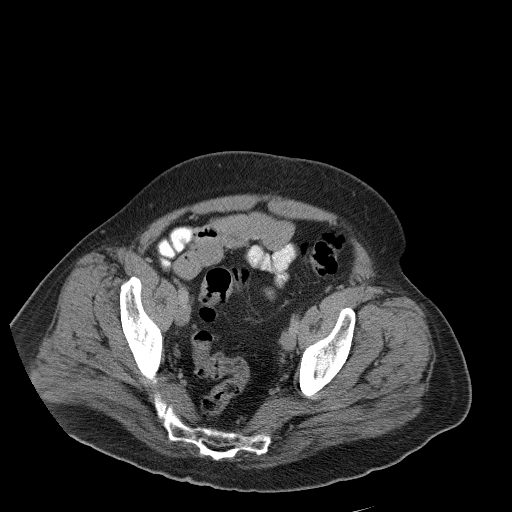
[im 37/99  soft-tissue]
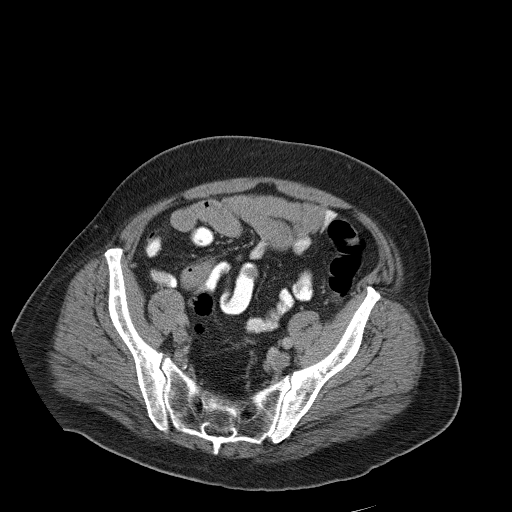
[im 43/99  soft-tissue]
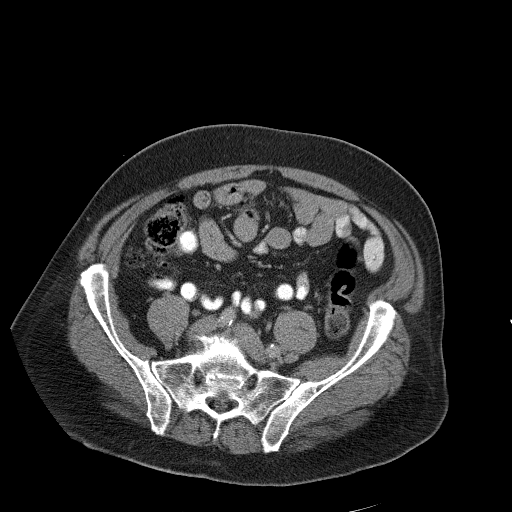
[im 50/99  soft-tissue]
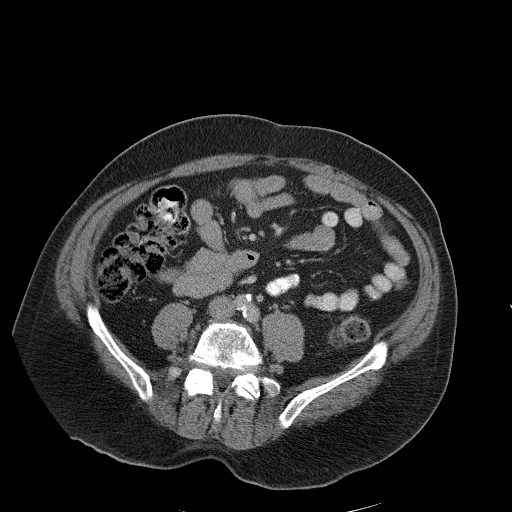
[im 56/99  soft-tissue]
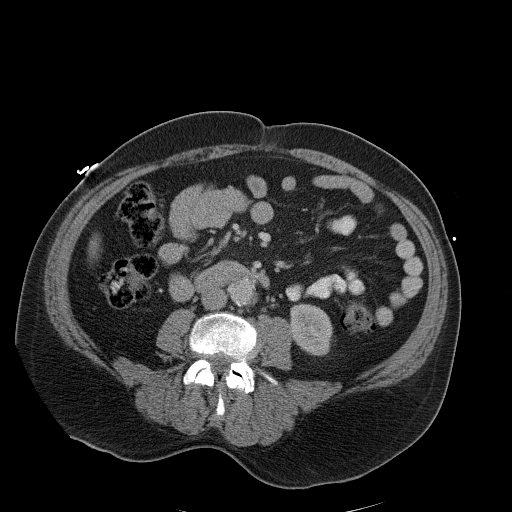
[im 62/99  soft-tissue]
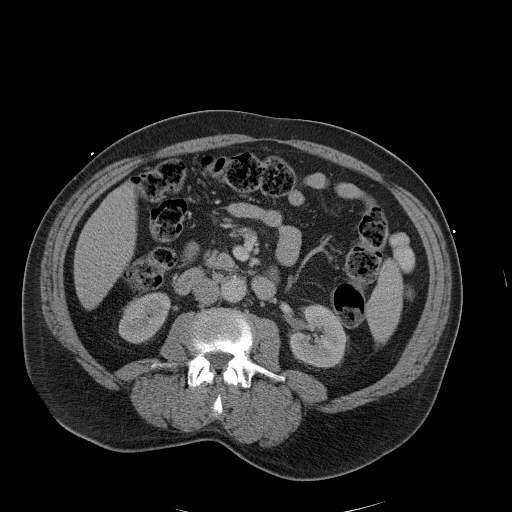
[im 62/99  bone]
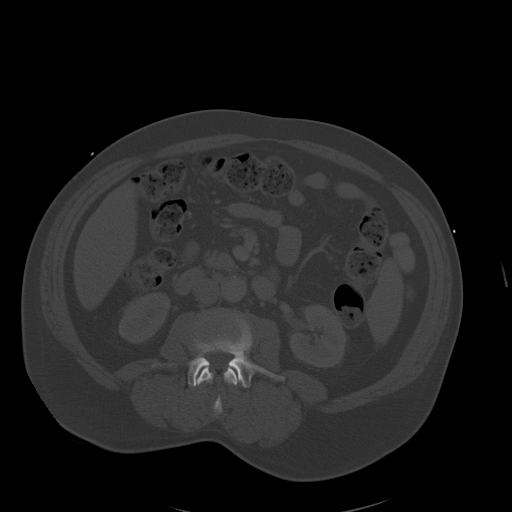
[im 68/99  soft-tissue]
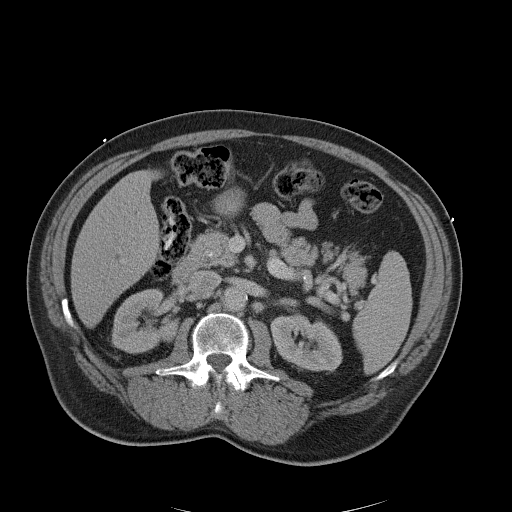
[im 80/99  soft-tissue]
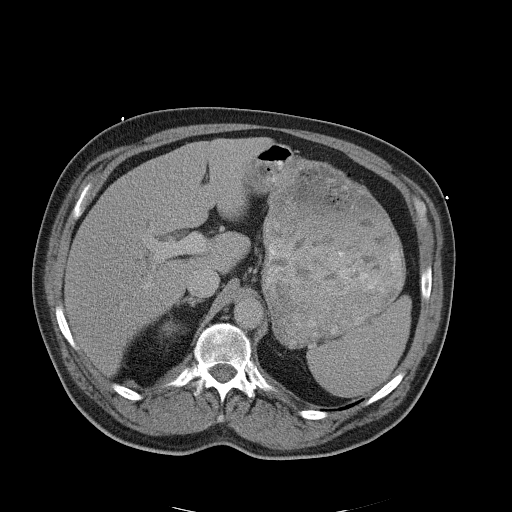
[im 86/99  soft-tissue]
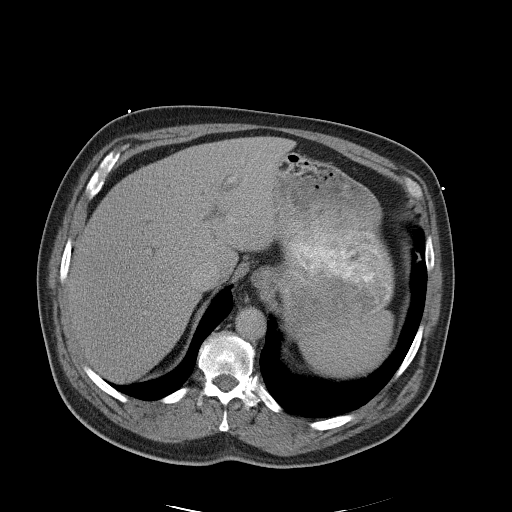
[im 92/99  soft-tissue]
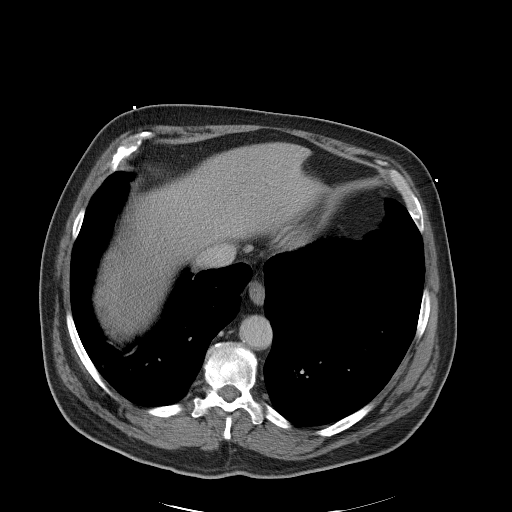

[Series 602: <mpr thick range> · coronal · 0.96mm/px · 3 of 195 slices shown]
[im 65/195  soft-tissue]
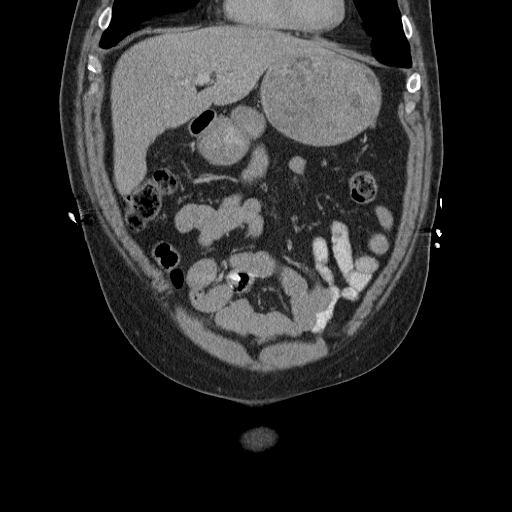
[im 87/195  soft-tissue]
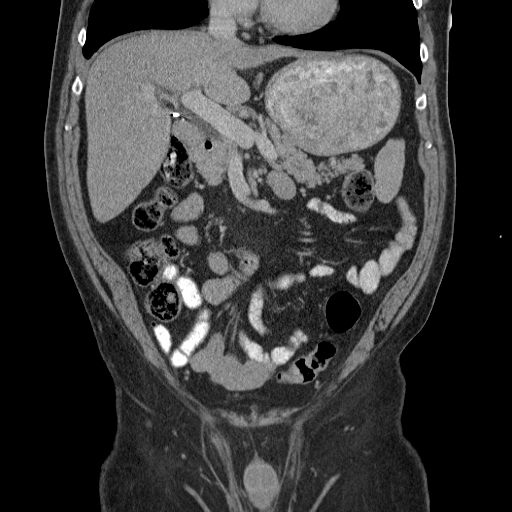
[im 108/195  soft-tissue]
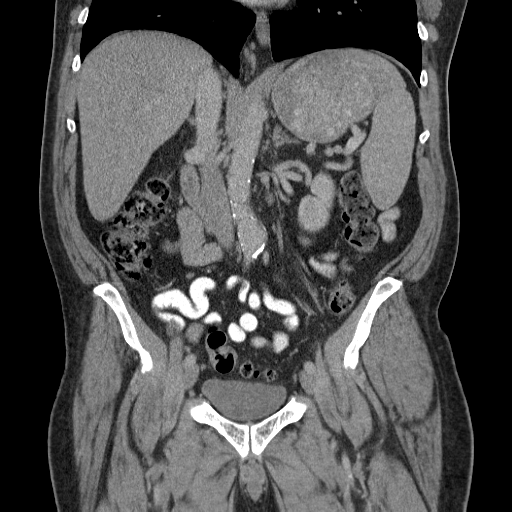

[16 of 46 positions shown; findings below may reference images not displayed]

FINDINGS: Lower chest: Ground-glass attenuation is asymmetrically present at
the right lung base. The left lung base is clear. The heart size is
normal. No significant pleural or pericardial effusion is present.

Hepatobiliary: The liver is within normal limits. The common bile
duct is within normal limits following cholecystectomy.

Pancreas: Unremarkable.

Spleen: Within normal limits

Adrenals/Urinary Tract: The adrenal glands are within normal limits
bilaterally. A 2.8 x 2.7 x 3.5 cm exophytic lesion at the upper pole
of the right kidney demonstrates interval growth. This is
high-density lesion with some peripheral enhancement compared to the
previous noncontrast study. No other focal renal lesions are
present. The ureters are within normal limits bilaterally. Urinary
bladder is unremarkable.

Stomach/Bowel: The stomach is moderately distended. No focal lesions
are present. The duodenum and small bowel are unremarkable. Oral
contrast can be seen in the distal small bowel to the level of the
cecum. The appendix is visualized and normal. The remainder the
colon is unremarkable.

Vascular/Lymphatic: Atherosclerotic changes are present in the
abdominal aorta without aneurysm. There is marked tortuosity of the
splenic artery. No significant adenopathy is present.

Reproductive: Within normal limits

Other: No free fluid is present

Musculoskeletal: A vacuum disc is present at L5-S1. Asymmetric
left-sided facet changes are present at L4-5. No focal lytic or
blastic lesions are present. Rightward curvature is stable.
IMPRESSION: 1. Asymmetric right basilar airspace disease concerning for
pneumonia or aspiration.
2. Exophytic 3.5 cm right renal mass demonstrates interval growth
and is concerning for a renal cell carcinoma.
3. Extensive atherosclerotic change without aneurysm.
4. Cholecystectomy.
5. Degenerative changes of the lumbar spine as described.
These results will be called to the ordering clinician or
representative by the Radiologist Assistant, and communication
documented in the PACS or zVision Dashboard.

## 2015-12-06 MED FILL — LISINOPRIL-HCTZ 20-12.5 MG: 20-12.5 | 30 days supply | Qty: 60 | Fill #0

## 2015-12-06 MED FILL — AMLODIPINE BESYLATE 10 MG T: 10 | 30 days supply | Qty: 30 | Fill #3

## 2015-12-18 ENCOUNTER — Ambulatory Visit: Payer: Self-pay | Admitting: Family Medicine

## 2016-01-17 ENCOUNTER — Ambulatory Visit: Payer: Self-pay | Admitting: Family Medicine

## 2016-02-01 ENCOUNTER — Other Ambulatory Visit: Payer: Self-pay | Admitting: Urology

## 2016-02-01 ENCOUNTER — Ambulatory Visit (HOSPITAL_COMMUNITY)
Admission: RE | Admit: 2016-02-01 | Discharge: 2016-02-01 | Disposition: A | Discharge status: Home / Self Care | Payer: Medicaid Other | Source: Ambulatory Visit | Attending: Urology | Admitting: Urology

## 2016-02-01 DIAGNOSIS — D3001 Benign neoplasm of right kidney: Secondary | ICD-10-CM | POA: Diagnosis not present

## 2016-02-01 DIAGNOSIS — R938 Abnormal findings on diagnostic imaging of other specified body structures: Secondary | ICD-10-CM | POA: Insufficient documentation

## 2016-02-01 DIAGNOSIS — C641 Malignant neoplasm of right kidney, except renal pelvis: Secondary | ICD-10-CM

## 2016-02-09 ENCOUNTER — Ambulatory Visit: Payer: Self-pay | Admitting: Family Medicine

## 2016-03-17 ENCOUNTER — Other Ambulatory Visit: Payer: Self-pay | Admitting: Family Medicine

## 2016-03-17 DIAGNOSIS — I10 Essential (primary) hypertension: Secondary | ICD-10-CM

## 2016-08-27 ENCOUNTER — Other Ambulatory Visit (HOSPITAL_COMMUNITY): Payer: Self-pay | Admitting: Nurse Practitioner

## 2016-08-27 DIAGNOSIS — B1920 Unspecified viral hepatitis C without hepatic coma: Secondary | ICD-10-CM

## 2016-09-03 ENCOUNTER — Ambulatory Visit (HOSPITAL_COMMUNITY)
Admission: RE | Admit: 2016-09-03 | Discharge: 2016-09-03 | Disposition: A | Discharge status: Home / Self Care | Payer: Medicaid Other | Source: Ambulatory Visit | Attending: Urology | Admitting: Urology

## 2016-09-03 ENCOUNTER — Other Ambulatory Visit: Payer: Self-pay | Admitting: Urology

## 2016-09-03 DIAGNOSIS — C641 Malignant neoplasm of right kidney, except renal pelvis: Secondary | ICD-10-CM | POA: Insufficient documentation

## 2016-09-11 ENCOUNTER — Ambulatory Visit (HOSPITAL_COMMUNITY)
Admission: RE | Admit: 2016-09-11 | Discharge: 2016-09-11 | Disposition: A | Discharge status: Home / Self Care | Payer: Medicare Other | Source: Ambulatory Visit | Attending: Nurse Practitioner | Admitting: Nurse Practitioner

## 2016-09-11 DIAGNOSIS — Z9049 Acquired absence of other specified parts of digestive tract: Secondary | ICD-10-CM | POA: Insufficient documentation

## 2016-09-11 DIAGNOSIS — B182 Chronic viral hepatitis C: Secondary | ICD-10-CM | POA: Diagnosis present

## 2016-09-11 DIAGNOSIS — B1920 Unspecified viral hepatitis C without hepatic coma: Secondary | ICD-10-CM

## 2016-11-20 ENCOUNTER — Encounter (HOSPITAL_COMMUNITY): Payer: Self-pay | Admitting: Emergency Medicine

## 2016-11-20 ENCOUNTER — Inpatient Hospital Stay (HOSPITAL_COMMUNITY)
Admission: EM | Admit: 2016-11-20 | Discharge: 2016-11-22 | DRG: 190 | Disposition: A | Discharge status: Home / Self Care | Payer: Medicare Other | Attending: Internal Medicine | Admitting: Internal Medicine

## 2016-11-20 ENCOUNTER — Emergency Department (HOSPITAL_COMMUNITY): Payer: Medicare Other

## 2016-11-20 DIAGNOSIS — I10 Essential (primary) hypertension: Secondary | ICD-10-CM | POA: Diagnosis present

## 2016-11-20 DIAGNOSIS — J441 Chronic obstructive pulmonary disease with (acute) exacerbation: Secondary | ICD-10-CM | POA: Diagnosis not present

## 2016-11-20 DIAGNOSIS — N179 Acute kidney failure, unspecified: Secondary | ICD-10-CM | POA: Diagnosis not present

## 2016-11-20 DIAGNOSIS — M109 Gout, unspecified: Secondary | ICD-10-CM | POA: Diagnosis present

## 2016-11-20 DIAGNOSIS — Z85528 Personal history of other malignant neoplasm of kidney: Secondary | ICD-10-CM

## 2016-11-20 DIAGNOSIS — B182 Chronic viral hepatitis C: Secondary | ICD-10-CM | POA: Diagnosis not present

## 2016-11-20 DIAGNOSIS — E860 Dehydration: Secondary | ICD-10-CM | POA: Diagnosis present

## 2016-11-20 DIAGNOSIS — E669 Obesity, unspecified: Secondary | ICD-10-CM | POA: Diagnosis present

## 2016-11-20 DIAGNOSIS — J189 Pneumonia, unspecified organism: Secondary | ICD-10-CM

## 2016-11-20 DIAGNOSIS — I959 Hypotension, unspecified: Secondary | ICD-10-CM | POA: Diagnosis present

## 2016-11-20 DIAGNOSIS — I129 Hypertensive chronic kidney disease with stage 1 through stage 4 chronic kidney disease, or unspecified chronic kidney disease: Secondary | ICD-10-CM | POA: Diagnosis present

## 2016-11-20 DIAGNOSIS — F319 Bipolar disorder, unspecified: Secondary | ICD-10-CM | POA: Diagnosis present

## 2016-11-20 DIAGNOSIS — F17210 Nicotine dependence, cigarettes, uncomplicated: Secondary | ICD-10-CM | POA: Diagnosis present

## 2016-11-20 DIAGNOSIS — E871 Hypo-osmolality and hyponatremia: Secondary | ICD-10-CM | POA: Diagnosis not present

## 2016-11-20 DIAGNOSIS — Z79899 Other long term (current) drug therapy: Secondary | ICD-10-CM

## 2016-11-20 DIAGNOSIS — J9601 Acute respiratory failure with hypoxia: Secondary | ICD-10-CM | POA: Diagnosis present

## 2016-11-20 DIAGNOSIS — J440 Chronic obstructive pulmonary disease with acute lower respiratory infection: Secondary | ICD-10-CM | POA: Diagnosis not present

## 2016-11-20 DIAGNOSIS — F3175 Bipolar disorder, in partial remission, most recent episode depressed: Secondary | ICD-10-CM

## 2016-11-20 DIAGNOSIS — B1920 Unspecified viral hepatitis C without hepatic coma: Secondary | ICD-10-CM | POA: Diagnosis present

## 2016-11-20 DIAGNOSIS — N183 Chronic kidney disease, stage 3 (moderate): Secondary | ICD-10-CM | POA: Diagnosis present

## 2016-11-20 DIAGNOSIS — Z6834 Body mass index (BMI) 34.0-34.9, adult: Secondary | ICD-10-CM

## 2016-11-20 HISTORY — DX: Malignant neoplasm of unspecified kidney, except renal pelvis: C64.9

## 2016-11-20 LAB — COMPREHENSIVE METABOLIC PANEL
ALBUMIN: 4.5 g/dL (ref 3.5–5.0)
ALK PHOS: 66 U/L (ref 38–126)
ALT: 16 U/L — ABNORMAL LOW (ref 17–63)
ANION GAP: 12 (ref 5–15)
AST: 20 U/L (ref 15–41)
BUN: 39 mg/dL — AB (ref 6–20)
CALCIUM: 9.8 mg/dL (ref 8.9–10.3)
CO2: 24 mmol/L (ref 22–32)
Chloride: 95 mmol/L — ABNORMAL LOW (ref 101–111)
Creatinine, Ser: 2.52 mg/dL — ABNORMAL HIGH (ref 0.61–1.24)
GFR calc Af Amer: 29 mL/min — ABNORMAL LOW (ref >60)
GFR calc non Af Amer: 25 mL/min — ABNORMAL LOW (ref >60)
GLUCOSE: 112 mg/dL — AB (ref 65–99)
Potassium: 4.2 mmol/L (ref 3.5–5.1)
SODIUM: 131 mmol/L — AB (ref 135–145)
Total Bilirubin: 0.9 mg/dL (ref 0.3–1.2)
Total Protein: 8.3 g/dL — ABNORMAL HIGH (ref 6.5–8.1)

## 2016-11-20 LAB — URINALYSIS, ROUTINE W REFLEX MICROSCOPIC
Glucose, UA: NEGATIVE mg/dL
Hgb urine dipstick: NEGATIVE
KETONES UR: 5 mg/dL — AB
Leukocytes, UA: NEGATIVE
Nitrite: NEGATIVE
PROTEIN: 30 mg/dL — AB
Specific Gravity, Urine: 1.023 (ref 1.005–1.030)
pH: 5 (ref 5.0–8.0)

## 2016-11-20 LAB — CBC WITH DIFFERENTIAL/PLATELET
BASOS PCT: 0 %
Basophils Absolute: 0 10*3/uL (ref 0.0–0.1)
Eosinophils Absolute: 0 10*3/uL (ref 0.0–0.7)
Eosinophils Relative: 0 %
HEMATOCRIT: 40 % (ref 39.0–52.0)
HEMOGLOBIN: 14.8 g/dL (ref 13.0–17.0)
LYMPHS PCT: 10 %
Lymphs Abs: 1.6 10*3/uL (ref 0.7–4.0)
MCH: 34.3 pg — AB (ref 26.0–34.0)
MCHC: 37 g/dL — AB (ref 30.0–36.0)
MCV: 92.8 fL (ref 78.0–100.0)
MONOS PCT: 10 %
Monocytes Absolute: 1.6 10*3/uL — ABNORMAL HIGH (ref 0.1–1.0)
NEUTROS ABS: 12.6 10*3/uL — AB (ref 1.7–7.7)
Neutrophils Relative %: 80 %
Platelets: 201 10*3/uL (ref 150–400)
RBC: 4.31 MIL/uL (ref 4.22–5.81)
WBC: 15.9 10*3/uL — ABNORMAL HIGH (ref 4.0–10.5)

## 2016-11-20 LAB — TROPONIN I: Troponin I: <0.03 ng/mL (ref <0.03)

## 2016-11-20 LAB — RAPID URINE DRUG SCREEN, HOSP PERFORMED
AMPHETAMINES: POSITIVE — AB
Barbiturates: NOT DETECTED
Benzodiazepines: NOT DETECTED
COCAINE: NOT DETECTED
OPIATES: NOT DETECTED
Tetrahydrocannabinol: NOT DETECTED

## 2016-11-20 LAB — RAPID STREP SCREEN (MED CTR MEBANE ONLY): Streptococcus, Group A Screen (Direct): NEGATIVE

## 2016-11-20 LAB — I-STAT CG4 LACTIC ACID, ED
Lactic Acid, Venous: 1.36 mmol/L (ref 0.5–1.9)
Lactic Acid, Venous: 1.16 mmol/L (ref 0.5–1.9)

## 2016-11-20 MED ORDER — AZITHROMYCIN 500 MG IV SOLR
500.0000 mg | Freq: Once | INTRAVENOUS | Status: DC
  Filled 2016-11-20: qty 500

## 2016-11-20 MED ORDER — ONDANSETRON HCL 4 MG/2ML IJ SOLN
4.0000 mg | Freq: Once | INTRAMUSCULAR | Status: AC
  Administered 2016-11-20: 4 mg via INTRAVENOUS
  Filled 2016-11-20: qty 2

## 2016-11-20 MED ORDER — SODIUM CHLORIDE 0.9 % IV BOLUS (SEPSIS)
1000.0000 mL | Freq: Once | INTRAVENOUS | Status: AC
  Administered 2016-11-20: 1000 mL via INTRAVENOUS

## 2016-11-20 MED ORDER — SODIUM CHLORIDE 0.9 % IV SOLN
1000.0000 mL | INTRAVENOUS | Status: DC
  Administered 2016-11-20 – 2016-11-21 (×2): 1000 mL via INTRAVENOUS

## 2016-11-20 MED ORDER — HYDROCOD POLST-CPM POLST ER 10-8 MG/5ML PO SUER
5.0000 mL | Freq: Four times a day (QID) | ORAL | Status: DC | PRN
  Administered 2016-11-20: 5 mL via ORAL
  Filled 2016-11-20: qty 5

## 2016-11-20 MED ORDER — METHYLPREDNISOLONE SODIUM SUCC 125 MG IJ SOLR
125.0000 mg | Freq: Once | INTRAMUSCULAR | Status: AC
  Administered 2016-11-20: 125 mg via INTRAVENOUS
  Filled 2016-11-20: qty 2

## 2016-11-20 MED ORDER — DEXTROSE 5 % IV SOLN
1.0000 g | Freq: Once | INTRAVENOUS | Status: DC
  Administered 2016-11-20: 1 g via INTRAVENOUS
  Filled 2016-11-20: qty 10

## 2016-11-20 MED ORDER — IPRATROPIUM-ALBUTEROL 0.5-2.5 (3) MG/3ML IN SOLN
3.0000 mL | Freq: Once | RESPIRATORY_TRACT | Status: AC
  Administered 2016-11-20: 3 mL via RESPIRATORY_TRACT
  Filled 2016-11-20: qty 3

## 2016-11-20 NOTE — ED Notes (Signed)
Returned from xray

## 2016-11-20 NOTE — ED Provider Notes (Signed)
[facility] DEPT Provider Note   CSN: [id number] Arrival date & time: 11/20/16  2000/09/27     History   Chief Complaint Chief Complaint  Patient presents with  . Cough  . Fatigue    HPI Jonathan Wilson is a 66 y.o. male presenting with a 4 day history of fatigue, and 2 day history of cough, chest tightness, and shortness of breath.  Patient states that he recently started medicine for hep C, and thought his fatigue was due to this. However a few days later, he started to develop fever, cough, shortness of breath, chest tightness. He reports this feels similar to last time he had pneumonia. Patient's cough is nonproductive, and when he coughs he reports chest wall pain. He has no chest pain while at rest. He has not tried anything to alleviate his symptoms. Patient with a PMH significant for COPD, hep C, and recurrent PNA. He has an inhaler at home, which he uses daily in the mornings. He he reports no alleviation of symptoms with this inhaler use. He has not tried anything else at home for symptom relief. He reports some associated nausea, and threw up 1 today prior to arrival. There is no blood in his emesis. Additionally, patient reports sore throat and pain with swallowing, but is having no issues handling his secretions or eating food. He denies abdominal pain, urinary symptoms, or abnormal bowel movements. He denies rash and neck stiffness. Patient denies leg swelling, pain, or redness. Patient has no sick contacts, does not recently been hospitalized.  HPI  Past Medical History:  Diagnosis Date  . Anxiety   . Anxiety and depression 09-28-98   worse after death of wife Apr 29, 2013  . Depression   . Diverticulosis of colon 09/27/2009   seen on CT scan in 2009-09-27.   [name] Gout   . Hepatitis C before 09/28/98  . Hyperlipidemia   . Hypertension   . Influenza A 06/2013   acute resp failure, did not require intubation.  . Obesity    300 # in 27-Sep-2008, BMI 34 in 04/2013.   [name] Pneumonia    recurrent episodes     . Psychosis 09/28/98   s/e of treatment for Hepatitis C.    . Renal cancer ([place])   . Right lower lobe lung mass 05/2008   resolved on follow up CT 2010-09-28.   . Vitamin D deficiency     Patient Active Problem List   Diagnosis Date Noted  . COPD exacerbation ([place]) 11/20/2016  . Syncope 09/19/2015  . Hypotension 09/19/2015  . Faintness   . History of renal cell cancer 08/07/2015  . Bipolar disorder ([place]) 06/28/2015  . Lumbago 05/24/2015  . Hypertensive urgency 04/29/2015  . Thrombocytopenia ([place]) 04/29/2015  . Hypertensive crisis 04/29/2015  . Chest pain 04/29/2015  . Anxiety 05/09/2014  . GAD (generalized anxiety disorder)   . Substance induced mood disorder ([place])   . Cholecystitis, acute 01/13/2014  . Renal mass, right 01/13/2014  . Uncontrolled hypertension 01/13/2014  . Choledocholithiasis 01/12/2014  . 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 Hypertension   . Hyperlipidemia   . Vitamin D deficiency   . PSYCHOSIS 07/17/2010  . TOBACCO ABUSE 07/17/2010  . COPD (chronic obstructive pulmonary disease) ([place]) 02/24/2009  . GOUT 02/07/2009  . Anxiety state 02/07/2009  . Hepatitis C 02/07/2009  . PERSONAL HISTORY PNEUMONIA RECURRENT 01/30/2009    Past Surgical History:  Procedure Laterality Date  . CHOLECYSTECTOMY N/A 01/14/2014   Procedure: LAPAROSCOPIC CHOLECYSTECTOMY  WITH INTRAOPERATIVE CHOLANGIOGRAM;  Surgeon: Shann Medal, MD;  Location: [facility] ORS;  Service: General;  Laterality: N/A;  . MANDIBLE FRACTURE SURGERY    . ROBOTIC ASSITED PARTIAL NEPHRECTOMY Right 08/07/2015   Procedure: XI ROBOTIC ASSISTED RIGHT PARTIAL NEPHRECTOMY;  Surgeon: Cleon Gustin, MD;  Location: [facility] ORS;  Service: Urology;  Laterality: Right;       Home Medications    Prior to Admission medications   Medication Sig Start Date End Date Taking? Authorizing Provider  albuterol (PROVENTIL HFA;VENTOLIN HFA) 108 (90 Base) MCG/ACT inhaler Inhale 2-4 puffs into the lungs every 4 (four)  hours as needed for wheezing or shortness of breath. 07/25/15  Yes Amao, Charlane Ferretti, MD  allopurinol (ZYLOPRIM) 100 MG tablet Take 300 mg by mouth daily.   Yes [provider]  beclomethasone (QVAR) 80 MCG/ACT inhaler Inhale 1 puff into the lungs 2 (two) times daily.   Yes [provider]  lisinopril-hydrochlorothiazide (PRINZIDE,ZESTORETIC) 20-12.5 MG tablet TAKE TWO TABLETS BY MOUTH ONCE DAILY Patient taking differently: Take 1 tablet by mouth once daily 03/18/16  Yes Amao, Enobong, MD  PARoxetine (PAXIL) 40 MG tablet Take 40 mg by mouth daily.   Yes [provider]  Sofosbuvir-Velpatasvir (EPCLUSA) 400-100 MG TABS Take 1 tablet by mouth daily.   Yes [provider]  tiotropium (SPIRIVA) 18 MCG inhalation capsule Place 18 mcg into inhaler and inhale daily.   Yes [provider]  traZODone (DESYREL) 150 MG tablet Take 150 mg by mouth at bedtime.   Yes [provider]    Family History Family History  Problem Relation Age of Onset  . Diabetes Mother   . Arthritis Mother   . Cancer Father        pancreatic cancer    Social History Social History  Substance Use Topics  . Smoking status: Former Smoker    Packs/day: 0.25    Years: 40.00    Types: Cigarettes  . Smokeless tobacco: Never Used  . Alcohol use No     Comment: hx of etoh abuse- 12 years ago      Allergies   Patient has no known allergies.   Review of Systems Review of Systems  All other systems reviewed and are negative.    Physical Exam Updated Vital Signs BP 103/78 (BP Location: Left Arm)   Pulse 92   Temp 98.8 F (37.1 C) (Oral)   Resp (!) 22   SpO2 95%   Physical Exam  Constitutional: He is oriented to person, place, and time. He appears well-developed and well-nourished. No distress.  HENT:  Head: Normocephalic and atraumatic.  Mouth/Throat: Uvula is midline. Mucous membranes are dry. Posterior oropharyngeal erythema present. No oropharyngeal exudate  or posterior oropharyngeal edema.  Eyes: Conjunctivae and EOM are normal. Pupils are equal, round, and reactive to light.  Neck: Normal range of motion. Neck supple.  Cardiovascular: Normal rate, regular rhythm, normal heart sounds and intact distal pulses.   Pulmonary/Chest: No respiratory distress. He has wheezes. He exhibits tenderness.  Very distant lung sounds throughout. Expiratory wheeze throughout lung sounds. Tenderness to palpation of chest wall.  Abdominal: Soft. Bowel sounds are normal. He exhibits no distension. There is tenderness.  Tenderness to palpation of the right upper quadrant, likely associated with hep C.  Musculoskeletal: Normal range of motion.  Lymphadenopathy:    He has no cervical adenopathy.  Neurological: He is alert and oriented to person, place, and time.  Skin: Skin is warm and dry. He is not  diaphoretic.  Psychiatric: He has a normal mood and affect.  Nursing note and vitals reviewed.    ED Treatments / Results  Labs (all labs ordered are listed, but only abnormal results are displayed) Labs Reviewed  COMPREHENSIVE METABOLIC PANEL - Abnormal; Notable for the following:       Result Value   Sodium 131 (*)    Chloride 95 (*)    Glucose, Bld 112 (*)    BUN 39 (*)    Creatinine, Ser 2.52 (*)    Total Protein 8.3 (*)    ALT 16 (*)    GFR calc non Af Amer 25 (*)    GFR calc Af Amer 29 (*)    All other components within normal limits  CBC WITH DIFFERENTIAL/PLATELET - Abnormal; Notable for the following:    WBC 15.9 (*)    MCH 34.3 (*)    MCHC 37.0 (*)    Neutro Abs 12.6 (*)    Monocytes Absolute 1.6 (*)    All other components within normal limits  RAPID STREP SCREEN (NOT AT [facility])  CULTURE, GROUP A STREP ([place])  TROPONIN I  URINALYSIS, ROUTINE W REFLEX MICROSCOPIC  RAPID URINE DRUG SCREEN, HOSP PERFORMED  SODIUM, URINE, RANDOM  CREATININE, URINE, RANDOM  OSMOLALITY, URINE  OSMOLALITY  I-STAT CG4 LACTIC ACID, ED  I-STAT CG4 LACTIC ACID, ED      EKG  EKG Interpretation None       Radiology Dg Chest 2 View  Result Date: 11/20/2016 CLINICAL DATA:  Cough and shortness of breath EXAM: CHEST  2 VIEW COMPARISON:  Chest radiograph 09/03/2016 FINDINGS: Cardiomediastinal silhouette is unchanged. There is calcific aortic atherosclerosis. No focal consolidation or edema. No pneumothorax or pleural effusion. IMPRESSION: No active cardiopulmonary disease. Electronically Signed   By: Ulyses Jarred M.D.   On: 11/20/2016 20:46    Procedures Procedures (including critical care time)  Medications Ordered in ED Medications  sodium chloride 0.9 % bolus 1,000 mL (0 mLs Intravenous Stopped 11/20/16 2144)    Followed by  0.9 %  sodium chloride infusion (1,000 mLs Intravenous New Bag/Given 11/20/16 2228)  chlorpheniramine-HYDROcodone (TUSSIONEX) 10-8 MG/5ML suspension 5 mL (5 mLs Oral Given 11/20/16 2229)  azithromycin (ZITHROMAX) 500 mg in dextrose 5 % 250 mL IVPB (not administered)  ipratropium-albuterol (DUONEB) 0.5-2.5 (3) MG/3ML nebulizer solution 3 mL (3 mLs Nebulization Given 11/20/16 2133)  methylPREDNISolone sodium succinate (SOLU-MEDROL) 125 mg/2 mL injection 125 mg (125 mg Intravenous Given 11/20/16 2116)  ondansetron (ZOFRAN) injection 4 mg (4 mg Intravenous Given 11/20/16 2116)     Initial Impression / Assessment and Plan / ED Course  I have reviewed the triage vital signs and the nursing notes.  Pertinent labs & imaging results that were available during my care of the patient were reviewed by me and considered in my medical decision making (see chart for details).  Clinical Course as of Nov 20 2301  Wed Nov 20, 2016  2130 Initial assessment shows patient lying on the bed looking ill. He is not in respiratory distress at this time. He has oxygen on, his vitals on arrival showed sat of 88%. He does not wear oxygen at home. Patient with diffuse wheezing and distant lung sounds. Concern for PNA, COPD exacerbation, or other infection.  No leg swelling or redness, and low suspicion for PE at this time (wells score 1.5/low risk).  Will order chest x-ray, basic labs, troponin, EKG, UA, and rapid strep. Will start patients on IV fluids, Solu-Medrol, and start  DuoNeb.  [[place]]  2223 On reassessment, patient states his shortness of breath is slightly improved with the nebulized treatment. He reports he is still feeling ill. Lung sounds remain distant, but wheezing is improved. O2 sats improved on O2 via nasal cannula. Chest x-ray negative for pneumonia at this time. Lactate normal, rapid strep negative, and troponin negative. EKG reassuring. Elevated white count. CMP shows worsening kidney function with a creatinine at 2.5 and GFR of 25. Pt dehydrated.  While chest x-rays negative, patient presents clinically with infection, likely pneumonia, as he presents with cough, fever, shortness of breath, and a new oxygen requirement. Will start patient on antibiotics. I am concerned about pt outcome, as he has multiple medical problems. Will contact hospitalist about admission.  Patient planning a sore throat, and will give him some Tussionex for symptom relief.     [[place]]  2259 Discussed with hospitalist, and patient to be admitted.  [[place]]    Clinical Course User Index [[place]] Rhys Anchondo, PA-C     Final Clinical Impressions(s) / ED Diagnoses   Final diagnoses:  COPD with acute exacerbation ([place])  Community acquired pneumonia, unspecified laterality  Acute kidney injury [facility])    New Prescriptions New Prescriptions   No medications on file     Erick Alley 11/20/16 2303    Duffy Bruce, MD 11/21/16 [phone]

## 2016-11-20 NOTE — ED Notes (Signed)
EKG given to EDP,Messner,MD., for review. 

## 2016-11-20 NOTE — ED Triage Notes (Signed)
Pt brought in by EMS with c/o cough, fatigue, shortness of breath, and chest wall pain for the past 4 days  Pt has hx of pneumonia  EKG unremarkable

## 2016-11-21 DIAGNOSIS — F17210 Nicotine dependence, cigarettes, uncomplicated: Secondary | ICD-10-CM | POA: Diagnosis present

## 2016-11-21 DIAGNOSIS — F319 Bipolar disorder, unspecified: Secondary | ICD-10-CM | POA: Diagnosis present

## 2016-11-21 DIAGNOSIS — M109 Gout, unspecified: Secondary | ICD-10-CM | POA: Diagnosis present

## 2016-11-21 DIAGNOSIS — E669 Obesity, unspecified: Secondary | ICD-10-CM | POA: Diagnosis present

## 2016-11-21 DIAGNOSIS — F3175 Bipolar disorder, in partial remission, most recent episode depressed: Secondary | ICD-10-CM | POA: Diagnosis not present

## 2016-11-21 DIAGNOSIS — N179 Acute kidney failure, unspecified: Secondary | ICD-10-CM | POA: Diagnosis present

## 2016-11-21 DIAGNOSIS — I959 Hypotension, unspecified: Secondary | ICD-10-CM | POA: Diagnosis present

## 2016-11-21 DIAGNOSIS — B1920 Unspecified viral hepatitis C without hepatic coma: Secondary | ICD-10-CM | POA: Diagnosis present

## 2016-11-21 DIAGNOSIS — E860 Dehydration: Secondary | ICD-10-CM | POA: Diagnosis present

## 2016-11-21 DIAGNOSIS — N183 Chronic kidney disease, stage 3 (moderate): Secondary | ICD-10-CM | POA: Diagnosis present

## 2016-11-21 DIAGNOSIS — J440 Chronic obstructive pulmonary disease with acute lower respiratory infection: Secondary | ICD-10-CM | POA: Diagnosis present

## 2016-11-21 DIAGNOSIS — E871 Hypo-osmolality and hyponatremia: Secondary | ICD-10-CM | POA: Diagnosis present

## 2016-11-21 DIAGNOSIS — Z85528 Personal history of other malignant neoplasm of kidney: Secondary | ICD-10-CM | POA: Diagnosis not present

## 2016-11-21 DIAGNOSIS — J9601 Acute respiratory failure with hypoxia: Secondary | ICD-10-CM | POA: Diagnosis present

## 2016-11-21 DIAGNOSIS — Z79899 Other long term (current) drug therapy: Secondary | ICD-10-CM | POA: Diagnosis not present

## 2016-11-21 DIAGNOSIS — B182 Chronic viral hepatitis C: Secondary | ICD-10-CM | POA: Diagnosis not present

## 2016-11-21 DIAGNOSIS — J441 Chronic obstructive pulmonary disease with (acute) exacerbation: Secondary | ICD-10-CM | POA: Diagnosis present

## 2016-11-21 DIAGNOSIS — Z6834 Body mass index (BMI) 34.0-34.9, adult: Secondary | ICD-10-CM | POA: Diagnosis not present

## 2016-11-21 DIAGNOSIS — I129 Hypertensive chronic kidney disease with stage 1 through stage 4 chronic kidney disease, or unspecified chronic kidney disease: Secondary | ICD-10-CM | POA: Diagnosis present

## 2016-11-21 LAB — RESPIRATORY PANEL BY PCR
Adenovirus: NOT DETECTED
BORDETELLA PERTUSSIS-RVPCR: NOT DETECTED
CHLAMYDOPHILA PNEUMONIAE-RVPPCR: NOT DETECTED
CORONAVIRUS OC43-RVPPCR: NOT DETECTED
Coronavirus 229E: NOT DETECTED
Coronavirus HKU1: NOT DETECTED
Coronavirus NL63: NOT DETECTED
INFLUENZA A-RVPPCR: NOT DETECTED
INFLUENZA B-RVPPCR: NOT DETECTED
MYCOPLASMA PNEUMONIAE-RVPPCR: NOT DETECTED
Metapneumovirus: NOT DETECTED
PARAINFLUENZA VIRUS 4-RVPPCR: NOT DETECTED
Parainfluenza Virus 1: NOT DETECTED
Parainfluenza Virus 2: NOT DETECTED
Parainfluenza Virus 3: NOT DETECTED
RESPIRATORY SYNCYTIAL VIRUS-RVPPCR: NOT DETECTED
Rhinovirus / Enterovirus: NOT DETECTED

## 2016-11-21 LAB — CBC
HCT: 37.7 % — ABNORMAL LOW (ref 39.0–52.0)
HCT: 37.3 % — ABNORMAL LOW (ref 39.0–52.0)
HEMOGLOBIN: 13.5 g/dL (ref 13.0–17.0)
Hemoglobin: 13.8 g/dL (ref 13.0–17.0)
MCH: 34.3 pg — AB (ref 26.0–34.0)
MCH: 33.7 pg (ref 26.0–34.0)
MCHC: 36.6 g/dL — ABNORMAL HIGH (ref 30.0–36.0)
MCHC: 36.2 g/dL — AB (ref 30.0–36.0)
MCV: 93.8 fL (ref 78.0–100.0)
MCV: 93 fL (ref 78.0–100.0)
PLATELETS: 171 10*3/uL (ref 150–400)
Platelets: 174 10*3/uL (ref 150–400)
RBC: 4.02 MIL/uL — AB (ref 4.22–5.81)
RBC: 4.01 MIL/uL — ABNORMAL LOW (ref 4.22–5.81)
RDW: 12.2 % (ref 11.5–15.5)
RDW: 12.1 % (ref 11.5–15.5)
WBC: 12.8 10*3/uL — ABNORMAL HIGH (ref 4.0–10.5)
WBC: 14.6 10*3/uL — ABNORMAL HIGH (ref 4.0–10.5)

## 2016-11-21 LAB — SODIUM, URINE, RANDOM: Sodium, Ur: 22 mmol/L

## 2016-11-21 LAB — BASIC METABOLIC PANEL
Anion gap: 11 (ref 5–15)
BUN: 42 mg/dL — ABNORMAL HIGH (ref 6–20)
CALCIUM: 9 mg/dL (ref 8.9–10.3)
CO2: 22 mmol/L (ref 22–32)
CREATININE: 2.52 mg/dL — AB (ref 0.61–1.24)
Chloride: 99 mmol/L — ABNORMAL LOW (ref 101–111)
GFR calc non Af Amer: 25 mL/min — ABNORMAL LOW (ref >60)
GFR, EST AFRICAN AMERICAN: 29 mL/min — AB (ref >60)
Glucose, Bld: 153 mg/dL — ABNORMAL HIGH (ref 65–99)
Potassium: 4.6 mmol/L (ref 3.5–5.1)
SODIUM: 132 mmol/L — AB (ref 135–145)

## 2016-11-21 LAB — OSMOLALITY: Osmolality: 285 mosm/kg (ref 275–295)

## 2016-11-21 LAB — PROCALCITONIN

## 2016-11-21 LAB — HIV ANTIBODY (ROUTINE TESTING W REFLEX): HIV SCREEN 4TH GENERATION: NONREACTIVE

## 2016-11-21 LAB — CREATININE, URINE, RANDOM: Creatinine, Urine: 446.28 mg/dL

## 2016-11-21 LAB — OSMOLALITY, URINE: Osmolality, Ur: 484 mosm/kg (ref 300–900)

## 2016-11-21 MED ORDER — ACETAMINOPHEN 325 MG PO TABS: 650.0000 mg | ORAL_TABLET | Freq: Four times a day (QID) | ORAL | Status: DC | PRN

## 2016-11-21 MED ORDER — SOFOSBUVIR-VELPATASVIR 400-100 MG PO TABS
1.0000 | ORAL_TABLET | Freq: Every day | ORAL | Status: DC
  Administered 2016-11-21 – 2016-11-22 (×2): 1 via ORAL

## 2016-11-21 MED ORDER — GUAIFENESIN ER 600 MG PO TB12
600.0000 mg | ORAL_TABLET | Freq: Two times a day (BID) | ORAL | Status: DC
  Administered 2016-11-21 – 2016-11-22 (×3): 600 mg via ORAL
  Filled 2016-11-21 (×3): qty 1

## 2016-11-21 MED ORDER — ALBUTEROL SULFATE (2.5 MG/3ML) 0.083% IN NEBU: 2.5000 mg | INHALATION_SOLUTION | RESPIRATORY_TRACT | Status: DC | PRN

## 2016-11-21 MED ORDER — ALBUTEROL SULFATE (2.5 MG/3ML) 0.083% IN NEBU
2.5000 mg | INHALATION_SOLUTION | Freq: Four times a day (QID) | RESPIRATORY_TRACT | Status: DC
Start: 2016-11-21 — End: 2016-11-22
  Administered 2016-11-21 (×4): 2.5 mg via RESPIRATORY_TRACT
  Filled 2016-11-21 (×4): qty 3

## 2016-11-21 MED ORDER — METHYLPREDNISOLONE SODIUM SUCC 40 MG IJ SOLR
40.0000 mg | Freq: Every day | INTRAMUSCULAR | Status: DC
  Administered 2016-11-21 – 2016-11-22 (×2): 40 mg via INTRAVENOUS
  Filled 2016-11-21 (×2): qty 1

## 2016-11-21 MED ORDER — NYSTATIN 100000 UNIT/ML MT SUSP
5.0000 mL | Freq: Four times a day (QID) | OROMUCOSAL | Status: DC
  Administered 2016-11-21 – 2016-11-22 (×4): 500000 [IU] via ORAL
  Filled 2016-11-21 (×4): qty 5

## 2016-11-21 MED ORDER — TRAZODONE HCL 100 MG PO TABS
150.0000 mg | ORAL_TABLET | Freq: Every day | ORAL | Status: DC
  Administered 2016-11-21 (×2): 150 mg via ORAL
  Filled 2016-11-21 (×2): qty 1

## 2016-11-21 MED ORDER — ENOXAPARIN SODIUM 30 MG/0.3ML ~~LOC~~ SOLN
30.0000 mg | SUBCUTANEOUS | Status: DC
  Administered 2016-11-21 – 2016-11-22 (×2): 30 mg via SUBCUTANEOUS
  Filled 2016-11-21 (×2): qty 0.3

## 2016-11-21 MED ORDER — SODIUM CHLORIDE 0.9 % IV SOLN
INTRAVENOUS | Status: DC
  Administered 2016-11-21: 100 mL/h via INTRAVENOUS
  Administered 2016-11-22: 02:00:00 via INTRAVENOUS

## 2016-11-21 MED ORDER — AZITHROMYCIN 250 MG PO TABS
250.0000 mg | ORAL_TABLET | Freq: Every day | ORAL | Status: DC
  Administered 2016-11-22: 250 mg via ORAL
  Filled 2016-11-21: qty 1

## 2016-11-21 MED ORDER — AZITHROMYCIN 250 MG PO TABS
500.0000 mg | ORAL_TABLET | Freq: Every day | ORAL | Status: AC
  Administered 2016-11-21: 500 mg via ORAL
  Filled 2016-11-21: qty 2

## 2016-11-21 MED ORDER — PAROXETINE HCL 20 MG PO TABS
40.0000 mg | ORAL_TABLET | Freq: Every day | ORAL | Status: DC
  Administered 2016-11-21 – 2016-11-22 (×2): 40 mg via ORAL
  Filled 2016-11-21 (×2): qty 2

## 2016-11-21 MED ORDER — ACETAMINOPHEN 650 MG RE SUPP: 650.0000 mg | Freq: Four times a day (QID) | RECTAL | Status: DC | PRN

## 2016-11-21 NOTE — Care Management Obs Status (Signed)
[place] NOTIFICATION   Patient Details  Name: Jonathan Wilson MRN: [id number] Date of Birth: Aug 22, 1950   Medicare Observation Status Notification Given:  Yes    Dessa Phi, RN 11/21/2016, 12:40 PM

## 2016-11-21 NOTE — Care Management Note (Signed)
Case Management Note  Patient Details  Name: Jonathan Wilson MRN: [id number] Date of Birth: February 07, 1951  Subjective/Objective: 66 y/o m admitted w/COPD. From home.                   Action/Plan:d/c plan home.   Expected Discharge Date:                  Expected Discharge Plan:  Home/Self Care  In-House Referral:     Discharge planning Services  CM Consult  Post Acute Care Choice:    Choice offered to:     DME Arranged:    DME Agency:     HH Arranged:    HH Agency:     Status of Service:  In process, will continue to follow  If discussed at Long Length of Stay Meetings, dates discussed:    Additional Comments:  Dessa Phi, RN 11/21/2016, 11:54 AM

## 2016-11-21 NOTE — H&P (Signed)
History and Physical  Patient Name: Jonathan Wilson     WSF:[id number]    DOB: 11-20-1950    DOA: 11/20/2016 PCP: Vicenta Aly, [place]  Patient coming from: Home  Chief Complaint: Fever, sore throat, cough, dyspnea      HPI: Jonathan Wilson is a 66 y.o. male with a past medical history significant for hep C, COPD, hypertension, and mood disorder who presents with few days fever, upper respiratory symptoms, wheezing.  The patient was in his usual state of health until a few days ago when he started to have cough, nasal congestion, sore throat. He has been diagnosed with COPD in the past, but has no inhalers at home that he uses regularly, and has been trying to nurse this at home until today when he felt wheezy, short of breath, and thought perhaps he had pneumonia, so he came to the ER.  ED course: -Afebrile, heart rate 92, respirations 22, pulse oximetry 88% on room air, blood pressure 103/70 -Na 131, K 4.2, Cr 2.52 (baseline 1.1), WBC 15.9K, Hgb 14.8 -Rapid strep negative -Troponin negative -Lactate 1.36 -CXR without focal infiltrate -ECG unchanged from previous -He was given Solu-medrol, Tussionex, CTX/azithromycin and 1L NS and TRH were asked to evaluate for dyspnea and hypoxia  He has been taking his home lisinopril.  He has no previous history of renal disease.  He has not been taking NAIDs or other OTC pain medicines.        ROS: Review of Systems  Constitutional: Positive for fever.  HENT: Positive for congestion and sore throat.   Respiratory: Positive for cough, shortness of breath and wheezing. Negative for sputum production.   Cardiovascular: Negative for chest pain, orthopnea and leg swelling.  All other systems reviewed and are negative.         Past Medical History:  Diagnosis Date  . Anxiety   . Anxiety and depression 29-Sep-1998   worse after death of wife April 30, 2013  . Depression   . Diverticulosis of colon 09-28-09   seen on CT scan in 09/28/2009.   [name] Gout   .  Hepatitis C before 29-Sep-1998  . Hyperlipidemia   . Hypertension   . Influenza A 06/2013   acute resp failure, did not require intubation.  . Obesity    300 # in 09-28-08, BMI 34 in 04/2013.   [name] Pneumonia    recurrent episodes   . Psychosis 29-Sep-1998   s/e of treatment for Hepatitis C.    . Renal cancer ([place])   . Right lower lobe lung mass 05/2008   resolved on follow up CT 29-Sep-2010.   . Vitamin D deficiency     Past Surgical History:  Procedure Laterality Date  . CHOLECYSTECTOMY N/A 01/14/2014   Procedure: LAPAROSCOPIC CHOLECYSTECTOMY WITH INTRAOPERATIVE CHOLANGIOGRAM;  Surgeon: Shann Medal, MD;  Location: [facility];  Service: General;  Laterality: N/A;  . MANDIBLE FRACTURE SURGERY    . ROBOTIC ASSITED PARTIAL NEPHRECTOMY Right 08/07/2015   Procedure: XI ROBOTIC ASSISTED RIGHT PARTIAL NEPHRECTOMY;  Surgeon: Cleon Gustin, MD;  Location: [facility];  Service: Urology;  Laterality: Right;    Social History: Patient lives with his brother in [place].  The patient walks unassisted.  Former smoker.  No alcohol now.  Used to put in flooring.  From [place], went to [place].  No Known Allergies  Family history: family history includes Arthritis in his mother; Cancer in his father; Diabetes in his mother.  Prior to Admission medications   Medication  Sig Start Date End Date Taking? Authorizing Provider  albuterol (PROVENTIL HFA;VENTOLIN HFA) 108 (90 Base) MCG/ACT inhaler Inhale 2-4 puffs into the lungs every 4 (four) hours as needed for wheezing or shortness of breath. 07/25/15  Yes Amao, Charlane Ferretti, MD  allopurinol (ZYLOPRIM) 100 MG tablet Take 300 mg by mouth daily.   Yes [provider]  beclomethasone (QVAR) 80 MCG/ACT inhaler Inhale 1 puff into the lungs 2 (two) times daily.   Yes [provider]  lisinopril-hydrochlorothiazide (PRINZIDE,ZESTORETIC) 20-12.5 MG tablet TAKE TWO TABLETS BY MOUTH ONCE DAILY Patient taking differently: Take 1 tablet by mouth once daily 03/18/16  Yes Amao,  Enobong, MD  PARoxetine (PAXIL) 40 MG tablet Take 40 mg by mouth daily.   Yes [provider]  Sofosbuvir-Velpatasvir (EPCLUSA) 400-100 MG TABS Take 1 tablet by mouth daily.   Yes [provider]  tiotropium (SPIRIVA) 18 MCG inhalation capsule Place 18 mcg into inhaler and inhale daily.   Yes [provider]  traZODone (DESYREL) 150 MG tablet Take 150 mg by mouth at bedtime.   Yes [provider]       Physical Exam: BP 104/68 (BP Location: Left Arm)   Pulse 85   Temp 98.4 F (36.9 C) (Oral)   Resp 20   SpO2 96%  General appearance: Well-developed, adult male, alert and in mild distress from dyspnea.   Eyes: Anicteric, conjunctiva pink, lids and lashes normal. PERRL.    ENT: No nasal deformity, discharge, epistaxis.  Hearing normal. OP moist without lesions.   Neck: No neck masses.  Trachea midline.  No thyromegaly/tenderness. Lymph: No cervical or supraclavicular lymphadenopathy. Skin: Warm and dry.  No jaundice.  No suspicious rashes or lesions. Cardiac: Tachycardic, regular, nl S1-S2, no murmurs appreciated.  Capillary refill is brisk.  JVP normal.  No LE edema.  Radial and DP pulses 2+ and symmetric. Respiratory: Tachpyneic, breathless with exertion of siting up.  Low pitched wheezes on left, diminished throughout. Abdomen: Abdomen soft.  No TTP. No ascites, distension, hepatosplenomegaly.   MSK: No deformities or effusions.  No cyanosis or clubbing. Neuro: Cranial nerves normal.  Sensation intact to light touch. Speech is fluent.  Muscle strength normal.    Psych: Sensorium intact and responding to questions, attention normal.  Behavior appropriate.  Affect normal.  Judgment and insight appear normal.     Labs on Admission:  I have personally reviewed following labs and imaging studies: CBC:  Recent Labs Lab 11/20/16 2030  WBC 15.9*  NEUTROABS 12.6*  HGB 14.8  HCT 40.0  MCV 92.8  PLT 277   Basic Metabolic Panel:  Recent  Labs Lab 11/20/16 2030  NA 131*  K 4.2  CL 95*  CO2 24  GLUCOSE 112*  BUN 39*  CREATININE 2.52*  CALCIUM 9.8   GFR: CrCl cannot be calculated (Unknown ideal weight.).  Liver Function Tests:  Recent Labs Lab 11/20/16 2030  AST 20  ALT 16*  ALKPHOS 66  BILITOT 0.9  PROT 8.3*  ALBUMIN 4.5   Cardiac Enzymes:  Recent Labs Lab 11/20/16 2030  TROPONINI <0.03   Sepsis Labs Lactic acid 1.36  Recent Results (from the past 240 hour(s))  Rapid strep screen     Status: None   Collection Time: 11/20/16  9:20 PM  Result Value Ref Range Status   Streptococcus, Group A Screen (Direct) NEGATIVE NEGATIVE Final    Comment: (NOTE) A Rapid Antigen test may result negative if the antigen level in the sample is below the  detection level of this test. The FDA has not cleared this test as a stand-alone test therefore the rapid antigen negative result has reflexed to a Group A Strep culture.          Radiological Exams on Admission: Personally reviewed CXR shows no airspace disease: Dg Chest 2 View  Result Date: 11/20/2016 CLINICAL DATA:  Cough and shortness of breath EXAM: CHEST  2 VIEW COMPARISON:  Chest radiograph 09/03/2016 FINDINGS: Cardiomediastinal silhouette is unchanged. There is calcific aortic atherosclerosis. No focal consolidation or edema. No pneumothorax or pleural effusion. IMPRESSION: No active cardiopulmonary disease. Electronically Signed   By: Ulyses Jarred M.D.   On: 11/20/2016 20:46    EKG: Independently reviewed. Rate 97, QTc 439, old bifascicular block.  Echocardiogram 2017: Report reviewed EF 55-60% Grade 1 DD PAP 38        Assessment/Plan Principal Problem:   COPD exacerbation (HCC) Active Problems:   GOUT   Hepatitis C   Essential hypertension   Bipolar disorder (HCC)   AKI (acute kidney injury) ([place])   Hyponatremia  1. Acute kidney injury:  No NSAIDs.  In setting of lisinopril.  Only new med is [place] for Hep C.  No OTCs.     -Compute FeNA -Check UA for sediment -Check urine protein -Check UDS -IV fluids and trend SCr -Hold ACEi   2. COPD exacerbation:  No opacity on CXR, doubt pneumonia.  Does not take Spiriva or Qvar. -IV solu-medrol daily -Scheduled and PRN nebulized bronchodilators -Check procalcitonin and RV panel  3. Hypertension:  -Continue HCTZ -Hold lisinopril  4. Hepatitis C:  -Continue Epclusa  5. Anxiety:  -Continue Paxil, trazodone  6. Hyponatremia:  -Check urine Na, free water clearance -Trend Na  7. Gout:  -Hold allopurinol until renal dynamics clearer     DVT prophylaxis: Lovenox  Code Status: FULL  Family Communication: None present  Disposition Plan: Anticipate IV fluids, trend SCr.  Treat COPD flare. Consults called: None Admission status: OBS At the point of initial evaluation, it is my clinical opinion that admission for OBSERVATION is reasonable and necessary because the patient's presenting complaints in the context of their chronic conditions represent sufficient risk of deterioration or significant morbidity to constitute reasonable grounds for close observation in the hospital setting, but that the patient may be medically stable for discharge from the hospital within 24 to 48 hours.    Medical decision making: Patient seen at 11:10 PM on 11/20/2016.  The patient was discussed with Rachael Fee, PA-C.  What exists of the patient's chart was reviewed in depth and summarized above.  Clinical condition: stable.        Edwin Dada [facility] Pager [phone]

## 2016-11-21 NOTE — Progress Notes (Addendum)
PROGRESS NOTE    Jonathan Wilson   HGD:[id number]  DOB: 11-Mar-1951  DOA: 11/20/2016 PCP: Vicenta Aly, FNP   Brief Narrative:  Jonathan Wilson is a 66 y.o. male with a past medical history significant for hep C, COPD, hypertension, and mood disorder who presents with few days fever, upper respiratory symptoms, wheezing.  Subjective: Feels much better. Continues to have a cough with clear sputum. Does not wear O2 at home. No chest pain.   Assessment & Plan:   Principal Problem:   COPD exacerbation / acute hypoxic resp failure - agree with daily solumedrol for now- cont Nebs, add Mucinex and Z pak - attempt to wean off of O2 - ambulate in hall and f/u pulse ox   Active Problems:  Leukocytosis - likely acute infection- improving despite steriods     Hepatitis C - cont Sofosbuvir-Velpatasvir    Hypotension and AKI with h/o Essential hypertension - hold Lisinopril/ HCTZ until BP and renal function improves    Hyponatremia - likely dehydration- follow     Bipolar disorder  - paxil   DVT prophylaxis: Lovenox Code Status: Full code Family Communication:  Disposition Plan: home in 24-48 hrs Consultants:    Procedures:    Antimicrobials:  Anti-infectives    Start     Dose/Rate Route Frequency Ordered Stop   11/22/16 1000  azithromycin (ZITHROMAX) tablet 250 mg     250 mg Oral Daily 11/21/16 1057 11/26/16 0959   11/21/16 1100  azithromycin (ZITHROMAX) tablet 500 mg     500 mg Oral Daily 11/21/16 1057 11/21/16 1334   11/21/16 1000  Sofosbuvir-Velpatasvir 400-100 MG TABS 1 tablet     1 tablet Oral Daily 11/21/16 0050     11/20/16 2215  cefTRIAXone (ROCEPHIN) 1 g in dextrose 5 % 50 mL IVPB  Status:  Discontinued     1 g 100 mL/hr over 30 Minutes Intravenous  Once 11/20/16 2211 11/20/16 2258   11/20/16 2215  azithromycin (ZITHROMAX) 500 mg in dextrose 5 % 250 mL IVPB  Status:  Discontinued     500 mg 250 mL/hr over 60 Minutes Intravenous  Once 11/20/16 2211 11/21/16  0003       Objective: Vitals:   11/21/16 0815 11/21/16 0818 11/21/16 1543 11/21/16 1611  BP:   (!) 95/41 (!) 111/46  Pulse:   76 66  Resp:   20   Temp:   98 F (36.7 C)   TempSrc:   Oral   SpO2: 95% 95% 91% 97%  Weight:      Height:        Intake/Output Summary (Last 24 hours) at 11/21/16 1759 Last data filed at 11/21/16 1544  Gross per 24 hour  Intake          1661.67 ml  Output              400 ml  Net          1261.67 ml   Filed Weights   11/21/16 0050  Weight: 120.2 kg (265 lb)    Examination: General exam: Appears comfortable  HEENT: PERRLA, oral mucosa moist, no sclera icterus or thrush Respiratory system: mild bilateral wheezing and congested cough- pulse ox 95% on 2 L-  Respiratory effort normal. Cardiovascular system: S1 & S2 heard, RRR.  No murmurs  Gastrointestinal system: Abdomen soft, non-tender, nondistended. Normal bowel sound. No organomegaly Central nervous system: Alert and oriented. No focal neurological deficits. Extremities: No cyanosis, clubbing or edema Skin: No rashes  or ulcers Psychiatry:  Mood & affect appropriate.     Data Reviewed: I have personally reviewed following labs and imaging studies  CBC:  Recent Labs Lab 11/20/16 2030 11/21/16 0135  WBC 15.9* 12.8*  NEUTROABS 12.6*  --   HGB 14.8 13.8  HCT 40.0 37.7*  MCV 92.8 93.8  PLT 201 578   Basic Metabolic Panel:  Recent Labs Lab 11/20/16 2030 11/21/16 0135  NA 131* 132*  K 4.2 4.6  CL 95* 99*  CO2 24 22  GLUCOSE 112* 153*  BUN 39* 42*  CREATININE 2.52* 2.52*  CALCIUM 9.8 9.0   GFR: Estimated Creatinine Clearance: 41.4 mL/min (A) (by C-G formula based on SCr of 2.52 mg/dL (H)). Liver Function Tests:  Recent Labs Lab 11/20/16 2030  AST 20  ALT 16*  ALKPHOS 66  BILITOT 0.9  PROT 8.3*  ALBUMIN 4.5   No results for input(s): LIPASE, AMYLASE in the last 168 hours. No results for input(s): AMMONIA in the last 168 hours. Coagulation Profile: No results for  input(s): INR, PROTIME in the last 168 hours. Cardiac Enzymes:  Recent Labs Lab 11/20/16 2030  TROPONINI <0.03   BNP (last 3 results) No results for input(s): PROBNP in the last 8760 hours. HbA1C: No results for input(s): HGBA1C in the last 72 hours. CBG: No results for input(s): GLUCAP in the last 168 hours. Lipid Profile: No results for input(s): CHOL, HDL, LDLCALC, TRIG, CHOLHDL, LDLDIRECT in the last 72 hours. Thyroid Function Tests: No results for input(s): TSH, T4TOTAL, FREET4, T3FREE, THYROIDAB in the last 72 hours. Anemia Panel: No results for input(s): VITAMINB12, FOLATE, FERRITIN, TIBC, IRON, RETICCTPCT in the last 72 hours. Urine analysis:    Component Value Date/Time   COLORURINE AMBER (A) 11/20/2016 2316   APPEARANCEUR CLOUDY (A) 11/20/2016 2316   LABSPEC 1.023 11/20/2016 2316   PHURINE 5.0 11/20/2016 2316   GLUCOSEU NEGATIVE 11/20/2016 2316   HGBUR NEGATIVE 11/20/2016 2316   HGBUR negative 03/07/2009 0849   BILIRUBINUR SMALL (A) 11/20/2016 2316   KETONESUR 5 (A) 11/20/2016 2316   PROTEINUR 30 (A) 11/20/2016 2316   UROBILINOGEN 1.0 01/02/2015 1353   NITRITE NEGATIVE 11/20/2016 2316   LEUKOCYTESUR NEGATIVE 11/20/2016 2316   Sepsis Labs: @LABRCNTIP (procalcitonin:4,lacticidven:4) ) Recent Results (from the past 240 hour(s))  Rapid strep screen     Status: None   Collection Time: 11/20/16  9:20 PM  Result Value Ref Range Status   Streptococcus, Group A Screen (Direct) NEGATIVE NEGATIVE Final    Comment: (NOTE) A Rapid Antigen test may result negative if the antigen level in the sample is below the detection level of this test. The FDA has not cleared this test as a stand-alone test therefore the rapid antigen negative result has reflexed to a Group A Strep culture.   Respiratory Panel by PCR     Status: None   Collection Time: 11/21/16 12:04 AM  Result Value Ref Range Status   Adenovirus NOT DETECTED NOT DETECTED Final   Coronavirus 229E NOT DETECTED  NOT DETECTED Final   Coronavirus HKU1 NOT DETECTED NOT DETECTED Final   Coronavirus NL63 NOT DETECTED NOT DETECTED Final   Coronavirus OC43 NOT DETECTED NOT DETECTED Final   Metapneumovirus NOT DETECTED NOT DETECTED Final   Rhinovirus / Enterovirus NOT DETECTED NOT DETECTED Final   Influenza A NOT DETECTED NOT DETECTED Final   Influenza B NOT DETECTED NOT DETECTED Final   Parainfluenza Virus 1 NOT DETECTED NOT DETECTED Final   Parainfluenza Virus 2 NOT DETECTED  NOT DETECTED Final   Parainfluenza Virus 3 NOT DETECTED NOT DETECTED Final   Parainfluenza Virus 4 NOT DETECTED NOT DETECTED Final   Respiratory Syncytial Virus NOT DETECTED NOT DETECTED Final   Bordetella pertussis NOT DETECTED NOT DETECTED Final   Chlamydophila pneumoniae NOT DETECTED NOT DETECTED Final   Mycoplasma pneumoniae NOT DETECTED NOT DETECTED Final    Comment: Performed at [facility], [place] [address], [place], [place] [address]         Radiology Studies: Dg Chest 2 View  Result Date: 11/20/2016 CLINICAL DATA:  Cough and shortness of breath EXAM: CHEST  2 VIEW COMPARISON:  Chest radiograph 09/03/2016 FINDINGS: Cardiomediastinal silhouette is unchanged. There is calcific aortic atherosclerosis. No focal consolidation or edema. No pneumothorax or pleural effusion. IMPRESSION: No active cardiopulmonary disease. Electronically Signed   By: Ulyses Jarred M.D.   On: 11/20/2016 20:46      Scheduled Meds: . albuterol  2.5 mg Nebulization Q6H  . [START ON 11/22/2016] azithromycin  250 mg Oral Daily  . enoxaparin (LOVENOX) injection  30 mg Subcutaneous Q24H  . guaiFENesin  600 mg Oral BID  . methylPREDNISolone (SOLU-MEDROL) injection  40 mg Intravenous Daily  . nystatin  5 mL Oral QID  . PARoxetine  40 mg Oral Daily  . Sofosbuvir-Velpatasvir  1 tablet Oral Daily  . traZODone  150 mg Oral QHS   Continuous Infusions: . sodium chloride 100 mL/hr (11/21/16 1612)     LOS: 0 days    Time spent in minutes:  35    Debbe Odea, MD [facility] Pager: [email or web address] Password [facility] 11/21/2016, 5:59 PM

## 2016-11-22 DIAGNOSIS — J441 Chronic obstructive pulmonary disease with (acute) exacerbation: Secondary | ICD-10-CM

## 2016-11-22 LAB — BASIC METABOLIC PANEL
Anion gap: 9 (ref 5–15)
BUN: 45 mg/dL — ABNORMAL HIGH (ref 6–20)
CALCIUM: 9.1 mg/dL (ref 8.9–10.3)
CHLORIDE: 104 mmol/L (ref 101–111)
CO2: 24 mmol/L (ref 22–32)
CREATININE: 1.35 mg/dL — AB (ref 0.61–1.24)
GFR calc non Af Amer: 54 mL/min — ABNORMAL LOW (ref >60)
GLUCOSE: 127 mg/dL — AB (ref 65–99)
Potassium: 4.6 mmol/L (ref 3.5–5.1)
Sodium: 137 mmol/L (ref 135–145)

## 2016-11-22 LAB — PROCALCITONIN: Procalcitonin: 3.66 ng/mL

## 2016-11-22 MED ORDER — ALBUTEROL SULFATE (2.5 MG/3ML) 0.083% IN NEBU
2.5000 mg | INHALATION_SOLUTION | Freq: Three times a day (TID) | RESPIRATORY_TRACT | Status: DC
  Administered 2016-11-22: 2.5 mg via RESPIRATORY_TRACT
  Filled 2016-11-22: qty 3

## 2016-11-22 MED ORDER — LISINOPRIL-HYDROCHLOROTHIAZIDE 20-12.5 MG PO TABS: 2.0000 | ORAL_TABLET | Freq: Every day | ORAL | 0 refills | Status: DC

## 2016-11-22 MED ORDER — PREDNISONE 5 MG PO TABS: ORAL_TABLET | ORAL | 0 refills | Status: DC

## 2016-11-22 MED ORDER — AZITHROMYCIN 250 MG PO TABS: 250.0000 mg | ORAL_TABLET | Freq: Every day | ORAL | 0 refills | Status: AC

## 2016-11-22 NOTE — Discharge Summary (Signed)
Jonathan Wilson YHC:[id number] DOB: [birth year] DOA: 11/20/2016  PCP: Vicenta Aly, FNP  Admit date: 11/20/2016  Discharge date: 11/22/2016  Admitted From: Home   Disposition:  Home   Recommendations for Outpatient Follow-up:   Follow up with PCP in 1-2 weeks  PCP Please obtain BMP/CBC, 2 view CXR in 1week,  (see Discharge instructions)   PCP Please follow up on the following pending results: None   Home Health: None   Equipment/Devices: None  Consultations: None Discharge Conditio: Stable   CODE STATUS: Full  Diet Recommendation:  Heart Healthy    Chief Complaint  Patient presents with  . Cough  . Fatigue     Brief history of present illness from the day of admission and additional interim summary    Jonathan Wilson a 65 y.o.malewith a past medical history significant for hep C, COPD, hypertension, and mood disorderwho presents with few days fever, upper respiratory symptoms, wheezing.                                                                 Hospital Course    COPD exacerbation / acute hypoxic resp failure -   Was treated with IV Solu-Medrol along with azithromycin, nebulizer treatments and oxygen, now he is much better and feeling close to baseline, ambulatory in the hallway without any oxygen need and pulse ox remained above 92%, minimal wheezing on exam, will be placed on prednisone taper along with oral azithromycin. Requested to follow with PCP in 3-4 days. Request PCP to kindly arrange for outpatient pulmonary follow-up. Patient counseled to quit smoking.   Leukocytosis - reactionary due to #1 above and steroids, PCP to monitor   Hepatitis C - cont Sofosbuvir-Velpatasvir  Hypotension and AKI on chronic kidney disease stage III with h/o Essential hypertension - was dehydrated, got  gentle IV fluids, will continue to hold ACE/HCTZ combination for another 2 days after discharge, blood pressure has stabilized his symptom free, PCP to monitor blood pressure and BMP.  Bipolar disorder  - on paxil   Discharge diagnosis     Principal Problem:   COPD exacerbation ([place]) Active Problems:   GOUT   Hepatitis C   Essential hypertension   Bipolar disorder (HCC)   AKI (acute kidney injury) ([place])   Hyponatremia    Discharge instructions    Discharge Instructions    Diet - low sodium heart healthy    Complete by:  As directed    Discharge instructions    Complete by:  As directed    Follow with Primary MD Vicenta Aly, FNP in 7 days   Get CBC, CMP, 2 view Chest X ray checked  by Primary MD or SNF MD in 5-7 days ( we routinely change or add medications that can affect your baseline labs and  fluid status, therefore we recommend that you get the mentioned basic workup next visit with your PCP, your PCP may decide not to get them or add new tests based on their clinical decision)  Activity: As tolerated with Full fall precautions use walker/cane & assistance as needed  Disposition Home    Diet:  Heart Healthy   For Heart failure patients - Check your Weight same time everyday, if you gain over 2 pounds, or you develop in leg swelling, experience more shortness of breath or chest pain, call your Primary MD immediately. Follow Cardiac Low Salt Diet and 1.5 lit/day fluid restriction.  On your next visit with your primary care physician please Get Medicines reviewed and adjusted.  Please request your Prim.MD to go over all Hospital Tests and Procedure/Radiological results at the follow up, please get all Hospital records sent to your Prim MD by signing hospital release before you go home.  If you experience worsening of your admission symptoms, develop shortness of breath, life threatening emergency, suicidal or homicidal thoughts you must seek medical attention  immediately by calling 911 or calling your MD immediately  if symptoms less severe.  You Must read complete instructions/literature along with all the possible adverse reactions/side effects for all the Medicines you take and that have been prescribed to you. Take any new Medicines after you have completely understood and accpet all the possible adverse reactions/side effects.   Do not drive, operate heavy machinery, perform activities at heights, swimming or participation in water activities or provide baby sitting services if your were admitted for syncope or siezures until you have seen by Primary MD or a Neurologist and advised to do so again.  Do not drive when taking Pain medications.    Do not take more than prescribed Pain, Sleep and Anxiety Medications  Special Instructions: If you have smoked or chewed Tobacco  in the last 2 yrs please stop smoking, stop any regular Alcohol  and or any Recreational drug use.  Wear Seat belts while driving.   Please note  You were cared for by a hospitalist during your hospital stay. If you have any questions about your discharge medications or the care you received while you were in the hospital after you are discharged, you can call the unit and asked to speak with the hospitalist on call if the hospitalist that took care of you is not available. Once you are discharged, your primary care physician will handle any further medical issues. Please note that NO REFILLS for any discharge medications will be authorized once you are discharged, as it is imperative that you return to your primary care physician (or establish a relationship with a primary care physician if you do not have one) for your aftercare needs so that they can reassess your need for medications and monitor your lab values.   Increase activity slowly    Complete by:  As directed       Discharge Medications   Allergies as of 11/22/2016   No Known Allergies     Medication List      TAKE these medications   albuterol 108 (90 Base) MCG/ACT inhaler Commonly known as:  PROVENTIL HFA;VENTOLIN HFA Inhale 2-4 puffs into the lungs every 4 (four) hours as needed for wheezing or shortness of breath.   allopurinol 100 MG tablet Commonly known as:  ZYLOPRIM Take 300 mg by mouth daily.   azithromycin 250 MG tablet Commonly known as:  ZITHROMAX Take 1 tablet (250 mg total)  by mouth daily.   beclomethasone 80 MCG/ACT inhaler Commonly known as:  QVAR Inhale 1 puff into the lungs 2 (two) times daily.   EPCLUSA 400-100 MG Tabs Generic drug:  Sofosbuvir-Velpatasvir Take 1 tablet by mouth daily.   lisinopril-hydrochlorothiazide 20-12.5 MG tablet Commonly known as:  PRINZIDE,ZESTORETIC Take 2 tablets by mouth daily. Start taking on:  11/24/2016 What changed:  See the new instructions.  These instructions start on 11/24/2016. If you are unsure what to do until then, ask your doctor or other care provider.   PARoxetine 40 MG tablet Commonly known as:  PAXIL Take 40 mg by mouth daily.   predniSONE 5 MG tablet Commonly known as:  DELTASONE Label  & dispense according to the schedule below. 10 Pills PO for 3 days then, 8 Pills PO for 3 days, 6 Pills PO for 3 days, 4 Pills PO for 3 days, 2 Pills PO for 3 days, 1 Pills PO for 3 days, 1/2 Pill  PO for 3 days then STOP. Total 95 pills.   tiotropium 18 MCG inhalation capsule Commonly known as:  SPIRIVA Place 18 mcg into inhaler and inhale daily.   traZODone 150 MG tablet Commonly known as:  DESYREL Take 150 mg by mouth at bedtime.       Follow-up Information    Vicenta Aly, [place]. Schedule an appointment as soon as possible for a visit in 1 week(s).   Specialty:  Nurse Practitioner Contact information: [place] [address] [phone]           Major procedures and Radiology Reports - PLEASE review detailed and final reports thoroughly  -         Dg Chest 2 View  Result  Date: 11/20/2016 CLINICAL DATA:  Cough and shortness of breath EXAM: CHEST  2 VIEW COMPARISON:  Chest radiograph 09/03/2016 FINDINGS: Cardiomediastinal silhouette is unchanged. There is calcific aortic atherosclerosis. No focal consolidation or edema. No pneumothorax or pleural effusion. IMPRESSION: No active cardiopulmonary disease. Electronically Signed   By: Ulyses Jarred M.D.   On: 11/20/2016 20:46    Micro Results     Recent Results (from the past 240 hour(s))  Rapid strep screen     Status: None   Collection Time: 11/20/16  9:20 PM  Result Value Ref Range Status   Streptococcus, Group A Screen (Direct) NEGATIVE NEGATIVE Final    Comment: (NOTE) A Rapid Antigen test may result negative if the antigen level in the sample is below the detection level of this test. The FDA has not cleared this test as a stand-alone test therefore the rapid antigen negative result has reflexed to a Group A Strep culture.   Culture, group A strep     Status: None (Preliminary result)   Collection Time: 11/20/16  9:20 PM  Result Value Ref Range Status   Specimen Description THROAT  Final   Special Requests NONE Reflexed from [id number]  Final   Culture   Final    CULTURE REINCUBATED FOR BETTER GROWTH Performed at [facility], [place] [address] [place], [place] [address]    Report Status PENDING  Incomplete  Respiratory Panel by PCR     Status: None   Collection Time: 11/21/16 12:04 AM  Result Value Ref Range Status   Adenovirus NOT DETECTED NOT DETECTED Final   Coronavirus 229E NOT DETECTED NOT DETECTED Final   Coronavirus HKU1 NOT DETECTED NOT DETECTED Final   Coronavirus NL63 NOT DETECTED NOT DETECTED Final  Coronavirus OC43 NOT DETECTED NOT DETECTED Final   Metapneumovirus NOT DETECTED NOT DETECTED Final   Rhinovirus / Enterovirus NOT DETECTED NOT DETECTED Final   Influenza A NOT DETECTED NOT DETECTED Final   Influenza B NOT DETECTED NOT DETECTED Final   Parainfluenza Virus 1 NOT DETECTED  NOT DETECTED Final   Parainfluenza Virus 2 NOT DETECTED NOT DETECTED Final   Parainfluenza Virus 3 NOT DETECTED NOT DETECTED Final   Parainfluenza Virus 4 NOT DETECTED NOT DETECTED Final   Respiratory Syncytial Virus NOT DETECTED NOT DETECTED Final   Bordetella pertussis NOT DETECTED NOT DETECTED Final   Chlamydophila pneumoniae NOT DETECTED NOT DETECTED Final   Mycoplasma pneumoniae NOT DETECTED NOT DETECTED Final    Comment: Performed at [facility], [place] [address] [place], [place] [address]    Today   Subjective    Jonathan Wilson today has no headache,no chest abdominal pain,no new weakness tingling or numbness, feels much better wants to go home today.    Objective   Blood pressure 94/63, pulse 76, temperature 98.4 F (36.9 C), temperature source Oral, resp. rate 19, height 6\' 3"  (1.905 m), weight 120.2 kg (265 lb), SpO2 95 %.   Intake/Output Summary (Last 24 hours) at 11/22/16 1141 Last data filed at 11/22/16 1052  Gross per 24 hour  Intake             2920 ml  Output              900 ml  Net             2020 ml    Exam Awake Alert, Oriented x 3, No new F.N deficits, Normal affect [place].AT,PERRAL Supple Neck,No JVD, No cervical lymphadenopathy appriciated.  Symmetrical Chest wall movement, Good air movement bilaterally, No wheezing RRR,No Gallops,Rubs or new Murmurs, No Parasternal Heave +ve B.Sounds, Abd Soft, Non tender, No organomegaly appriciated, No rebound -guarding or rigidity. No Cyanosis, Clubbing or edema, No new Rash or bruise   Data Review   CBC w Diff:  Lab Results  Component Value Date   WBC 14.6 (H) 11/21/2016   HGB 13.5 11/21/2016   HCT 37.3 (L) 11/21/2016   PLT 174 11/21/2016   LYMPHOPCT 10 11/20/2016   MONOPCT 10 11/20/2016   EOSPCT 0 11/20/2016   BASOPCT 0 11/20/2016    CMP:  Lab Results  Component Value Date   NA 137 11/22/2016   K 4.6 11/22/2016   CL 104 11/22/2016   CO2 24 11/22/2016   BUN 45 (H) 11/22/2016   CREATININE 1.35  (H) 11/22/2016   CREATININE 0.98 07/16/2013   PROT 8.3 (H) 11/20/2016   ALBUMIN 4.5 11/20/2016   BILITOT 0.9 11/20/2016   ALKPHOS 66 11/20/2016   AST 20 11/20/2016   ALT 16 (L) 11/20/2016  .   Total Time in preparing paper work, data evaluation and todays exam - 74 minutes  Lala Lund M.D on 11/22/2016 at 11:41 AM  [facility]   Office  [phone]

## 2016-11-22 NOTE — Care Management Note (Signed)
Case Management Note  Patient Details  Name: Jonathan Wilson MRN: [id number] Date of Birth: 07/09/1950  Subjective/Objective:                    Action/Plan:d/c home.   Expected Discharge Date:  11/22/16               Expected Discharge Plan:  Home/Self Care  In-House Referral:     Discharge planning Services  CM Consult  Post Acute Care Choice:    Choice offered to:     DME Arranged:    DME Agency:     HH Arranged:    HH Agency:     Status of Service:  Completed, signed off  If discussed at [employer] of Stay Meetings, dates discussed:    Additional Comments:  Dessa Phi, RN 11/22/2016, 12:05 PM

## 2016-11-22 NOTE — Discharge Instructions (Signed)
Follow with Primary MD Vicenta Aly, FNP in 7 days   Get CBC, CMP, 2 view Chest X ray checked  by Primary MD or SNF MD in 5-7 days ( we routinely change or add medications that can affect your baseline labs and fluid status, therefore we recommend that you get the mentioned basic workup next visit with your PCP, your PCP may decide not to get them or add new tests based on their clinical decision)  Activity: As tolerated with Full fall precautions use walker/cane & assistance as needed  Disposition Home    Diet:  Heart Healthy   For Heart failure patients - Check your Weight same time everyday, if you gain over 2 pounds, or you develop in leg swelling, experience more shortness of breath or chest pain, call your Primary MD immediately. Follow Cardiac Low Salt Diet and 1.5 lit/day fluid restriction.  On your next visit with your primary care physician please Get Medicines reviewed and adjusted.  Please request your Prim.MD to go over all Hospital Tests and Procedure/Radiological results at the follow up, please get all Hospital records sent to your Prim MD by signing hospital release before you go home.  If you experience worsening of your admission symptoms, develop shortness of breath, life threatening emergency, suicidal or homicidal thoughts you must seek medical attention immediately by calling 911 or calling your MD immediately  if symptoms less severe.  You Must read complete instructions/literature along with all the possible adverse reactions/side effects for all the Medicines you take and that have been prescribed to you. Take any new Medicines after you have completely understood and accpet all the possible adverse reactions/side effects.   Do not drive, operate heavy machinery, perform activities at heights, swimming or participation in water activities or provide baby sitting services if your were admitted for syncope or siezures until you have seen by Primary MD or a Neurologist  and advised to do so again.  Do not drive when taking Pain medications.    Do not take more than prescribed Pain, Sleep and Anxiety Medications  Special Instructions: If you have smoked or chewed Tobacco  in the last 2 yrs please stop smoking, stop any regular Alcohol  and or any Recreational drug use.  Wear Seat belts while driving.   Please note  You were cared for by a hospitalist during your hospital stay. If you have any questions about your discharge medications or the care you received while you were in the hospital after you are discharged, you can call the unit and asked to speak with the hospitalist on call if the hospitalist that took care of you is not available. Once you are discharged, your primary care physician will handle any further medical issues. Please note that NO REFILLS for any discharge medications will be authorized once you are discharged, as it is imperative that you return to your primary care physician (or establish a relationship with a primary care physician if you do not have one) for your aftercare needs so that they can reassess your need for medications and monitor your lab values.

## 2016-11-23 LAB — CULTURE, GROUP A STREP (THRC)

## 2017-08-12 ENCOUNTER — Other Ambulatory Visit: Payer: Self-pay | Admitting: Nurse Practitioner

## 2017-08-12 DIAGNOSIS — K7400 Hepatic fibrosis, unspecified: Secondary | ICD-10-CM

## 2017-09-02 ENCOUNTER — Other Ambulatory Visit: Payer: Self-pay

## 2017-09-10 ENCOUNTER — Other Ambulatory Visit: Payer: Self-pay

## 2017-10-23 ENCOUNTER — Other Ambulatory Visit: Payer: Self-pay

## 2017-11-03 ENCOUNTER — Other Ambulatory Visit: Payer: Self-pay

## 2017-11-04 ENCOUNTER — Ambulatory Visit
Admission: RE | Admit: 2017-11-04 | Discharge: 2017-11-04 | Disposition: A | Discharge status: Home / Self Care | Payer: Medicare Other | Source: Ambulatory Visit | Attending: Nurse Practitioner | Admitting: Nurse Practitioner

## 2017-11-04 DIAGNOSIS — K7400 Hepatic fibrosis, unspecified: Secondary | ICD-10-CM

## 2018-05-11 ENCOUNTER — Other Ambulatory Visit: Payer: Self-pay | Admitting: Nurse Practitioner

## 2018-05-11 DIAGNOSIS — K7469 Other cirrhosis of liver: Secondary | ICD-10-CM

## 2018-05-22 ENCOUNTER — Other Ambulatory Visit: Payer: Self-pay

## 2018-05-28 ENCOUNTER — Other Ambulatory Visit: Payer: Self-pay

## 2018-06-05 ENCOUNTER — Ambulatory Visit
Admission: RE | Admit: 2018-06-05 | Discharge: 2018-06-05 | Disposition: A | Discharge status: Home / Self Care | Payer: Medicare Other | Source: Ambulatory Visit | Attending: Nurse Practitioner | Admitting: Nurse Practitioner

## 2018-06-05 DIAGNOSIS — K7469 Other cirrhosis of liver: Secondary | ICD-10-CM

## 2018-06-08 ENCOUNTER — Ambulatory Visit (INDEPENDENT_AMBULATORY_CARE_PROVIDER_SITE_OTHER): Payer: Medicare Other

## 2018-06-08 DIAGNOSIS — Z23 Encounter for immunization: Secondary | ICD-10-CM | POA: Diagnosis not present

## 2018-11-24 ENCOUNTER — Other Ambulatory Visit: Payer: Self-pay | Admitting: Nurse Practitioner

## 2018-11-24 DIAGNOSIS — K7469 Other cirrhosis of liver: Secondary | ICD-10-CM

## 2018-12-02 ENCOUNTER — Other Ambulatory Visit: Payer: Medicare Other

## 2019-01-20 ENCOUNTER — Other Ambulatory Visit: Payer: Medicare HMO

## 2019-01-22 ENCOUNTER — Emergency Department (HOSPITAL_COMMUNITY)
Admission: EM | Admit: 2019-01-22 | Discharge: 2019-01-23 | Disposition: A | Discharge status: Home / Self Care | Payer: Medicare HMO | Attending: Emergency Medicine | Admitting: Emergency Medicine

## 2019-01-22 ENCOUNTER — Emergency Department (HOSPITAL_COMMUNITY): Payer: Medicare HMO

## 2019-01-22 ENCOUNTER — Other Ambulatory Visit: Payer: Self-pay

## 2019-01-22 DIAGNOSIS — J441 Chronic obstructive pulmonary disease with (acute) exacerbation: Secondary | ICD-10-CM | POA: Diagnosis not present

## 2019-01-22 DIAGNOSIS — Z87891 Personal history of nicotine dependence: Secondary | ICD-10-CM | POA: Diagnosis not present

## 2019-01-22 DIAGNOSIS — I10 Essential (primary) hypertension: Secondary | ICD-10-CM | POA: Insufficient documentation

## 2019-01-22 DIAGNOSIS — R0602 Shortness of breath: Secondary | ICD-10-CM | POA: Diagnosis present

## 2019-01-22 DIAGNOSIS — Z20828 Contact with and (suspected) exposure to other viral communicable diseases: Secondary | ICD-10-CM | POA: Insufficient documentation

## 2019-01-22 LAB — BASIC METABOLIC PANEL
Anion gap: 15 (ref 5–15)
BUN: 17 mg/dL (ref 8–23)
CO2: 23 mmol/L (ref 22–32)
Calcium: 10.2 mg/dL (ref 8.9–10.3)
Chloride: 101 mmol/L (ref 98–111)
Creatinine, Ser: 1.58 mg/dL — ABNORMAL HIGH (ref 0.61–1.24)
GFR calc Af Amer: 52 mL/min — ABNORMAL LOW (ref >60)
GFR calc non Af Amer: 45 mL/min — ABNORMAL LOW (ref >60)
Glucose, Bld: 112 mg/dL — ABNORMAL HIGH (ref 70–99)
Potassium: 4.1 mmol/L (ref 3.5–5.1)
Sodium: 139 mmol/L (ref 135–145)

## 2019-01-22 LAB — CBC
HCT: 50 % (ref 39.0–52.0)
Hemoglobin: 17.1 g/dL — ABNORMAL HIGH (ref 13.0–17.0)
MCH: 30.9 pg (ref 26.0–34.0)
MCHC: 34.2 g/dL (ref 30.0–36.0)
MCV: 90.3 fL (ref 80.0–100.0)
Platelets: 318 10*3/uL (ref 150–400)
RBC: 5.54 MIL/uL (ref 4.22–5.81)
RDW: 12.3 % (ref 11.5–15.5)
WBC: 8.1 10*3/uL (ref 4.0–10.5)
nRBC: 0 % (ref 0.0–0.2)

## 2019-01-22 LAB — TROPONIN I (HIGH SENSITIVITY)
Troponin I (High Sensitivity): 9 ng/L (ref <18)
Troponin I (High Sensitivity): 16 ng/L (ref <18)

## 2019-01-22 MED ORDER — SODIUM CHLORIDE 0.9% FLUSH: 3.0000 mL | Freq: Once | INTRAVENOUS | Status: DC

## 2019-01-22 NOTE — ED Triage Notes (Signed)
Pt BIB [facility] from home, c/o chest pain and shortness of breath x 2 days. Hx COPD. Given 324mg  asa and 1 NTG with relief by EMS. EMS VS: BP 152/100, HR 100, RR 24, SpO2 98%

## 2019-01-22 NOTE — ED Notes (Signed)
Pt came over to tech requesting O2 pts O2 was 98% on RA. Tech placed pt on 2 liters on oxygen for pts comfort. Tech notified Lovena Le, Therapist, sports.

## 2019-01-23 DIAGNOSIS — J441 Chronic obstructive pulmonary disease with (acute) exacerbation: Secondary | ICD-10-CM | POA: Diagnosis not present

## 2019-01-23 LAB — BRAIN NATRIURETIC PEPTIDE: B Natriuretic Peptide: 58.1 pg/mL (ref 0.0–100.0)

## 2019-01-23 LAB — SARS CORONAVIRUS 2 (TAT 6-24 HRS)

## 2019-01-23 MED ORDER — PREDNISONE 20 MG PO TABS: ORAL_TABLET | ORAL | 0 refills | Status: DC

## 2019-01-23 MED ORDER — ALBUTEROL SULFATE HFA 108 (90 BASE) MCG/ACT IN AERS
4.0000 | INHALATION_SPRAY | Freq: Once | RESPIRATORY_TRACT | Status: AC
  Administered 2019-01-23: 4 via RESPIRATORY_TRACT

## 2019-01-23 MED ORDER — PREDNISONE 20 MG PO TABS
60.0000 mg | ORAL_TABLET | Freq: Once | ORAL | Status: AC
Start: 2019-01-23 — End: 2019-01-23
  Administered 2019-01-23: 60 mg via ORAL
  Filled 2019-01-23: qty 3

## 2019-01-23 MED ORDER — TIOTROPIUM BROMIDE MONOHYDRATE 18 MCG IN CAPS: 18.0000 ug | ORAL_CAPSULE | Freq: Every day | RESPIRATORY_TRACT | 1 refills | Status: DC

## 2019-01-23 MED ORDER — AZITHROMYCIN 250 MG PO TABS: 250.0000 mg | ORAL_TABLET | Freq: Every day | ORAL | 0 refills | Status: DC

## 2019-01-23 MED ORDER — ALBUTEROL SULFATE HFA 108 (90 BASE) MCG/ACT IN AERS: 2.0000 | INHALATION_SPRAY | RESPIRATORY_TRACT | 1 refills | Status: DC | PRN

## 2019-01-23 MED ORDER — ALBUTEROL SULFATE HFA 108 (90 BASE) MCG/ACT IN AERS
8.0000 | INHALATION_SPRAY | Freq: Once | RESPIRATORY_TRACT | Status: AC
  Administered 2019-01-23: 8 via RESPIRATORY_TRACT
  Filled 2019-01-23: qty 6.7
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
## 2019-01-23 NOTE — ED Notes (Addendum)
Unable to obtain last set of vitals. Pt had already removed bp cuff, and pulse ox and was ready to leave because his ride was waiting outside.

## 2019-01-25 ENCOUNTER — Ambulatory Visit: Payer: Self-pay

## 2019-01-25 NOTE — Telephone Encounter (Signed)
Incoming  Call from Patient requestint covid-19 lab results  Provided location of covid comuntiy test results. And provided location of [facility] testing results.Patient voiced understanding.

## 2019-01-26 ENCOUNTER — Other Ambulatory Visit: Payer: Self-pay

## 2019-01-26 DIAGNOSIS — Z20822 Contact with and (suspected) exposure to covid-19: Secondary | ICD-10-CM

## 2019-01-28 ENCOUNTER — Telehealth: Payer: Self-pay | Admitting: Nurse Practitioner

## 2019-01-28 ENCOUNTER — Other Ambulatory Visit: Payer: Medicare HMO

## 2019-01-28 LAB — NOVEL CORONAVIRUS, NAA: SARS-CoV-2, NAA: NOT DETECTED

## 2019-01-28 NOTE — Telephone Encounter (Signed)
Negative COVID results given. Patient results "NOT Detected." Caller expressed understanding. ° °

## 2019-02-17 ENCOUNTER — Other Ambulatory Visit: Payer: Medicare HMO

## 2019-02-22 ENCOUNTER — Other Ambulatory Visit: Payer: Medicare HMO

## 2019-02-24 ENCOUNTER — Ambulatory Visit
Admission: RE | Admit: 2019-02-24 | Discharge: 2019-02-24 | Disposition: A | Discharge status: Home / Self Care | Payer: Medicare HMO | Source: Ambulatory Visit | Attending: Nurse Practitioner | Admitting: Nurse Practitioner

## 2019-02-24 DIAGNOSIS — K7469 Other cirrhosis of liver: Secondary | ICD-10-CM

## 2019-05-20 ENCOUNTER — Other Ambulatory Visit: Payer: Self-pay

## 2019-05-20 ENCOUNTER — Other Ambulatory Visit (HOSPITAL_COMMUNITY): Payer: Self-pay | Admitting: Urology

## 2019-05-20 ENCOUNTER — Ambulatory Visit (HOSPITAL_COMMUNITY)
Admission: RE | Admit: 2019-05-20 | Discharge: 2019-05-20 | Disposition: A | Discharge status: Home / Self Care | Payer: Medicare HMO | Source: Ambulatory Visit | Attending: Urology | Admitting: Urology

## 2019-05-20 DIAGNOSIS — C641 Malignant neoplasm of right kidney, except renal pelvis: Secondary | ICD-10-CM | POA: Diagnosis not present

## 2019-08-27 ENCOUNTER — Other Ambulatory Visit: Payer: Self-pay | Admitting: Nurse Practitioner

## 2019-08-27 DIAGNOSIS — K7469 Other cirrhosis of liver: Secondary | ICD-10-CM

## 2019-11-04 ENCOUNTER — Ambulatory Visit
Admission: RE | Admit: 2019-11-04 | Discharge: 2019-11-04 | Disposition: A | Discharge status: Home / Self Care | Payer: Medicare Other | Source: Ambulatory Visit | Attending: Nurse Practitioner | Admitting: Nurse Practitioner

## 2019-11-04 DIAGNOSIS — K7469 Other cirrhosis of liver: Secondary | ICD-10-CM

## 2020-05-16 ENCOUNTER — Other Ambulatory Visit: Payer: Self-pay | Admitting: Nurse Practitioner

## 2020-05-16 DIAGNOSIS — K7460 Unspecified cirrhosis of liver: Secondary | ICD-10-CM

## 2020-07-06 ENCOUNTER — Encounter (HOSPITAL_COMMUNITY): Payer: Self-pay | Admitting: Emergency Medicine

## 2020-07-06 ENCOUNTER — Emergency Department (HOSPITAL_COMMUNITY): Payer: 59

## 2020-07-06 ENCOUNTER — Inpatient Hospital Stay (HOSPITAL_COMMUNITY)
Admission: EM | Admit: 2020-07-06 | Discharge: 2020-07-10 | DRG: 391 | Disposition: A | Discharge status: Home / Self Care | Payer: 59 | Attending: Internal Medicine | Admitting: Internal Medicine

## 2020-07-06 ENCOUNTER — Other Ambulatory Visit: Payer: Self-pay

## 2020-07-06 DIAGNOSIS — E875 Hyperkalemia: Secondary | ICD-10-CM | POA: Diagnosis not present

## 2020-07-06 DIAGNOSIS — E872 Acidosis, unspecified: Secondary | ICD-10-CM

## 2020-07-06 DIAGNOSIS — R339 Retention of urine, unspecified: Secondary | ICD-10-CM | POA: Diagnosis present

## 2020-07-06 DIAGNOSIS — R6510 Systemic inflammatory response syndrome (SIRS) of non-infectious origin without acute organ dysfunction: Secondary | ICD-10-CM | POA: Diagnosis not present

## 2020-07-06 DIAGNOSIS — F32A Depression, unspecified: Secondary | ICD-10-CM | POA: Diagnosis present

## 2020-07-06 DIAGNOSIS — J449 Chronic obstructive pulmonary disease, unspecified: Secondary | ICD-10-CM | POA: Diagnosis present

## 2020-07-06 DIAGNOSIS — F419 Anxiety disorder, unspecified: Secondary | ICD-10-CM | POA: Diagnosis present

## 2020-07-06 DIAGNOSIS — N1831 Chronic kidney disease, stage 3a: Secondary | ICD-10-CM | POA: Diagnosis present

## 2020-07-06 DIAGNOSIS — I719 Aortic aneurysm of unspecified site, without rupture: Secondary | ICD-10-CM | POA: Diagnosis present

## 2020-07-06 DIAGNOSIS — N179 Acute kidney failure, unspecified: Secondary | ICD-10-CM | POA: Diagnosis present

## 2020-07-06 DIAGNOSIS — R197 Diarrhea, unspecified: Secondary | ICD-10-CM | POA: Diagnosis present

## 2020-07-06 DIAGNOSIS — K5791 Diverticulosis of intestine, part unspecified, without perforation or abscess with bleeding: Secondary | ICD-10-CM | POA: Diagnosis present

## 2020-07-06 DIAGNOSIS — Z20822 Contact with and (suspected) exposure to covid-19: Secondary | ICD-10-CM | POA: Diagnosis present

## 2020-07-06 DIAGNOSIS — Z905 Acquired absence of kidney: Secondary | ICD-10-CM

## 2020-07-06 DIAGNOSIS — A09 Infectious gastroenteritis and colitis, unspecified: Principal | ICD-10-CM | POA: Diagnosis present

## 2020-07-06 DIAGNOSIS — F1190 Opioid use, unspecified, uncomplicated: Secondary | ICD-10-CM | POA: Diagnosis present

## 2020-07-06 DIAGNOSIS — E785 Hyperlipidemia, unspecified: Secondary | ICD-10-CM | POA: Diagnosis present

## 2020-07-06 DIAGNOSIS — Z79899 Other long term (current) drug therapy: Secondary | ICD-10-CM

## 2020-07-06 DIAGNOSIS — R0602 Shortness of breath: Secondary | ICD-10-CM

## 2020-07-06 DIAGNOSIS — I129 Hypertensive chronic kidney disease with stage 1 through stage 4 chronic kidney disease, or unspecified chronic kidney disease: Secondary | ICD-10-CM | POA: Diagnosis present

## 2020-07-06 DIAGNOSIS — Z7951 Long term (current) use of inhaled steroids: Secondary | ICD-10-CM

## 2020-07-06 DIAGNOSIS — Z9049 Acquired absence of other specified parts of digestive tract: Secondary | ICD-10-CM

## 2020-07-06 DIAGNOSIS — I959 Hypotension, unspecified: Secondary | ICD-10-CM | POA: Diagnosis not present

## 2020-07-06 DIAGNOSIS — E861 Hypovolemia: Secondary | ICD-10-CM | POA: Diagnosis present

## 2020-07-06 DIAGNOSIS — I10 Essential (primary) hypertension: Secondary | ICD-10-CM | POA: Diagnosis present

## 2020-07-06 DIAGNOSIS — Z85528 Personal history of other malignant neoplasm of kidney: Secondary | ICD-10-CM

## 2020-07-06 DIAGNOSIS — M109 Gout, unspecified: Secondary | ICD-10-CM | POA: Diagnosis present

## 2020-07-06 DIAGNOSIS — Z87891 Personal history of nicotine dependence: Secondary | ICD-10-CM

## 2020-07-06 LAB — COMPREHENSIVE METABOLIC PANEL
ALT: 23 U/L (ref 0–44)
AST: 31 U/L (ref 15–41)
Albumin: 4.8 g/dL (ref 3.5–5.0)
Alkaline Phosphatase: 84 U/L (ref 38–126)
Anion gap: 16 — ABNORMAL HIGH (ref 5–15)
BUN: 39 mg/dL — ABNORMAL HIGH (ref 8–23)
CO2: 29 mmol/L (ref 22–32)
Calcium: 10.6 mg/dL — ABNORMAL HIGH (ref 8.9–10.3)
Chloride: 97 mmol/L — ABNORMAL LOW (ref 98–111)
Creatinine, Ser: 2.62 mg/dL — ABNORMAL HIGH (ref 0.61–1.24)
GFR, Estimated: 26 mL/min — ABNORMAL LOW (ref >60)
Glucose, Bld: 136 mg/dL — ABNORMAL HIGH (ref 70–99)
Potassium: 5.7 mmol/L — ABNORMAL HIGH (ref 3.5–5.1)
Sodium: 142 mmol/L (ref 135–145)
Total Bilirubin: 1 mg/dL (ref 0.3–1.2)
Total Protein: 8.3 g/dL — ABNORMAL HIGH (ref 6.5–8.1)

## 2020-07-06 LAB — CBC WITH DIFFERENTIAL/PLATELET
Abs Immature Granulocytes: 0.11 10*3/uL — ABNORMAL HIGH (ref 0.00–0.07)
Basophils Absolute: 0.1 10*3/uL (ref 0.0–0.1)
Basophils Relative: 1 %
Eosinophils Absolute: 0.2 10*3/uL (ref 0.0–0.5)
Eosinophils Relative: 1 %
HCT: 53.8 % — ABNORMAL HIGH (ref 39.0–52.0)
Hemoglobin: 17.7 g/dL — ABNORMAL HIGH (ref 13.0–17.0)
Immature Granulocytes: 1 %
Lymphocytes Relative: 12 %
Lymphs Abs: 1.8 10*3/uL (ref 0.7–4.0)
MCH: 32.1 pg (ref 26.0–34.0)
MCHC: 32.9 g/dL (ref 30.0–36.0)
MCV: 97.5 fL (ref 80.0–100.0)
Monocytes Absolute: 0.9 10*3/uL (ref 0.1–1.0)
Monocytes Relative: 6 %
Neutro Abs: 12.4 10*3/uL — ABNORMAL HIGH (ref 1.7–7.7)
Neutrophils Relative %: 79 %
Platelets: 317 10*3/uL (ref 150–400)
RBC: 5.52 MIL/uL (ref 4.22–5.81)
RDW: 12.5 % (ref 11.5–15.5)
WBC: 15.5 10*3/uL — ABNORMAL HIGH (ref 4.0–10.5)
nRBC: 0 % (ref 0.0–0.2)

## 2020-07-06 LAB — SARS CORONAVIRUS 2 BY RT PCR (HOSPITAL ORDER, PERFORMED IN ~~LOC~~ HOSPITAL LAB): SARS Coronavirus 2: NEGATIVE

## 2020-07-06 LAB — PROTIME-INR
INR: 1 (ref 0.8–1.2)
Prothrombin Time: 13.2 seconds (ref 11.4–15.2)

## 2020-07-06 LAB — LIPASE, BLOOD: Lipase: 40 U/L (ref 11–51)

## 2020-07-06 LAB — LACTIC ACID, PLASMA
Lactic Acid, Venous: 5.4 mmol/L (ref 0.5–1.9)
Lactic Acid, Venous: 2.5 mmol/L (ref 0.5–1.9)

## 2020-07-06 LAB — APTT: aPTT: 33 seconds (ref 24–36)

## 2020-07-06 LAB — TROPONIN I (HIGH SENSITIVITY)
Troponin I (High Sensitivity): 22 ng/L — ABNORMAL HIGH (ref <18)
Troponin I (High Sensitivity): 19 ng/L — ABNORMAL HIGH (ref <18)

## 2020-07-06 MED ORDER — ACETAMINOPHEN 650 MG RE SUPP: 650.0000 mg | Freq: Four times a day (QID) | RECTAL | Status: DC | PRN

## 2020-07-06 MED ORDER — ONDANSETRON HCL 4 MG/2ML IJ SOLN
4.0000 mg | Freq: Once | INTRAMUSCULAR | Status: AC
  Administered 2020-07-06: 4 mg via INTRAVENOUS
  Filled 2020-07-06: qty 2

## 2020-07-06 MED ORDER — LACTATED RINGERS IV BOLUS (SEPSIS)
1000.0000 mL | Freq: Once | INTRAVENOUS | Status: AC
  Administered 2020-07-06: 1000 mL via INTRAVENOUS

## 2020-07-06 MED ORDER — METRONIDAZOLE IN NACL 5-0.79 MG/ML-% IV SOLN
500.0000 mg | Freq: Once | INTRAVENOUS | Status: AC
  Administered 2020-07-06: 500 mg via INTRAVENOUS
  Filled 2020-07-06: qty 100

## 2020-07-06 MED ORDER — ACETAMINOPHEN 325 MG PO TABS
650.0000 mg | ORAL_TABLET | Freq: Four times a day (QID) | ORAL | Status: DC | PRN
  Administered 2020-07-08: 650 mg via ORAL
  Filled 2020-07-06: qty 2

## 2020-07-06 MED ORDER — LACTATED RINGERS IV SOLN: INTRAVENOUS | Status: AC

## 2020-07-06 MED ORDER — LACTATED RINGERS IV BOLUS (SEPSIS)
1000.0000 mL | Freq: Once | INTRAVENOUS | Status: AC
  Administered 2020-07-07: 1000 mL via INTRAVENOUS

## 2020-07-06 MED ORDER — SODIUM CHLORIDE 0.9 % IV SOLN
2.0000 g | Freq: Once | INTRAVENOUS | Status: AC
  Administered 2020-07-06: 2 g via INTRAVENOUS
  Filled 2020-07-06: qty 2

## 2020-07-06 MED ORDER — PAROXETINE HCL 20 MG PO TABS
40.0000 mg | ORAL_TABLET | Freq: Every day | ORAL | Status: DC
  Administered 2020-07-07 – 2020-07-10 (×4): 40 mg via ORAL
  Filled 2020-07-06 (×4): qty 2

## 2020-07-06 MED ORDER — HEPARIN SODIUM (PORCINE) 5000 UNIT/ML IJ SOLN
5000.0000 [IU] | Freq: Three times a day (TID) | INTRAMUSCULAR | Status: DC
  Administered 2020-07-07 – 2020-07-08 (×5): 5000 [IU] via SUBCUTANEOUS
  Filled 2020-07-06 (×5): qty 1

## 2020-07-06 MED ORDER — ALLOPURINOL 300 MG PO TABS
300.0000 mg | ORAL_TABLET | Freq: Every day | ORAL | Status: DC
  Administered 2020-07-07 – 2020-07-10 (×4): 300 mg via ORAL
  Filled 2020-07-06 (×4): qty 1

## 2020-07-06 NOTE — Sepsis Progress Note (Signed)
Monitoring for the code sepsis protocol. °

## 2020-07-06 NOTE — ED Provider Notes (Signed)
[facility] DEPT Provider Note   CSN: [id number] Arrival date & time: 07/06/20  1732     History Chief Complaint  Patient presents with  . Diarrhea    Jonathan Wilson is a 70 y.o. male.  HPI   Patient presented to the ED for evaluation of diarrhea and weakness.  Patient states he had sudden onset of nausea diarrhea and profound weakness this afternoon.  Patient states he had the sudden urge to have a bowel movement.  Patient began having multiple episodes of diarrhea.  He states they are watery, and not mucousy or bloody.  Patient felt very weak.  He was on the commode for an extended period of time.  Patient started to feel diffuse body aches.  He was not aware of having any fevers.  He became nauseated but did not vomit.  According to the EMS report the patient had an episode of chest discomfort on the way over.  Patient denies any shortness of breath.  No history of abdominal pain issues.  Past Medical History:  Diagnosis Date  . Anxiety   . Anxiety and depression Sep 26, 1998   worse after death of wife 27-Mar-2013  . Depression   . Diverticulosis of colon 09/25/2009   seen on CT scan in 09/25/2009.   [name] Gout   . Hepatitis C before Sep 26, 1998  . Hyperlipidemia   . Hypertension   . Influenza A 06/2013   acute resp failure, did not require intubation.  . Obesity    300 # in 25-Sep-2008, BMI 34 in 04/2013.   [name] Pneumonia    recurrent episodes   . Psychosis ([place]) September 26, 1998   s/e of treatment for Hepatitis C.    . Renal cancer ([place])   . Right lower lobe lung mass 05/2008   resolved on follow up CT 2010-09-26.   . Vitamin D deficiency     Patient Active Problem List   Diagnosis Date Noted  . COPD exacerbation ([place]) 11/20/2016  . AKI (acute kidney injury) ([place]) 11/20/2016  . Hyponatremia 11/20/2016  . Syncope 09/19/2015  . History of renal cell cancer 08/07/2015  . Bipolar disorder ([place]) 06/28/2015  . Lumbago 05/24/2015  . Thrombocytopenia ([place]) 04/29/2015  . GAD (generalized anxiety  disorder)   . Substance induced mood disorder ([place])   . Cholecystitis, acute 01/13/2014  . Choledocholithiasis 01/12/2014  . 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 Essential hypertension   . Hyperlipidemia   . Vitamin D deficiency   . PSYCHOSIS 07/17/2010  . TOBACCO ABUSE 07/17/2010  . COPD (chronic obstructive pulmonary disease) ([place]) 02/24/2009  . GOUT 02/07/2009  . Hepatitis C 02/07/2009  . PERSONAL HISTORY PNEUMONIA RECURRENT 01/30/2009    Past Surgical History:  Procedure Laterality Date  . CHOLECYSTECTOMY N/A 01/14/2014   Procedure: LAPAROSCOPIC CHOLECYSTECTOMY WITH INTRAOPERATIVE CHOLANGIOGRAM;  Surgeon: Shann Medal, MD;  Location: [facility] ORS;  Service: General;  Laterality: N/A;  . MANDIBLE FRACTURE SURGERY    . ROBOTIC ASSITED PARTIAL NEPHRECTOMY Right 08/07/2015   Procedure: XI ROBOTIC ASSISTED RIGHT PARTIAL NEPHRECTOMY;  Surgeon: Cleon Gustin, MD;  Location: [facility];  Service: Urology;  Laterality: Right;       Family History  Problem Relation Age of Onset  . Diabetes Mother   . Arthritis Mother   . Cancer Father        pancreatic cancer    Social History   Tobacco Use  . Smoking status: Former Smoker    Packs/day: 0.25  Years: 40.00    Pack years: 10.00    Types: Cigarettes  . Smokeless tobacco: Never Used  Substance Use Topics  . Alcohol use: No    Alcohol/week: 1.0 standard drink    Types: 1 Cans of beer per week    Comment: hx of etoh abuse- 12 years ago   . Drug use: No    Types: Heroin    Comment: last use 12 years ago     Home Medications Prior to Admission medications   Medication Sig Start Date End Date Taking? Authorizing Provider  albuterol (VENTOLIN HFA) 108 (90 Base) MCG/ACT inhaler Inhale 2-4 puffs into the lungs every 4 (four) hours as needed for wheezing or shortness of breath. 01/23/19   Mesner, Corene Cornea, MD  allopurinol (ZYLOPRIM) 100 MG tablet Take 300 mg by mouth daily.    [provider]  azithromycin  (ZITHROMAX) 250 MG tablet Take 1 tablet (250 mg total) by mouth daily. Take first 2 tablets together, then 1 every day until finished. 01/23/19   Mesner, Corene Cornea, MD  beclomethasone (QVAR) 80 MCG/ACT inhaler Inhale 1 puff into the lungs 2 (two) times daily.    [provider]  lisinopril-hydrochlorothiazide (PRINZIDE,ZESTORETIC) 20-12.5 MG tablet Take 2 tablets by mouth daily. 11/24/16   Thurnell Lose, MD  PARoxetine (PAXIL) 40 MG tablet Take 40 mg by mouth daily.    [provider]  predniSONE (DELTASONE) 20 MG tablet 2 tabs po daily x 4 days 01/23/19   Mesner, Corene Cornea, MD  Sofosbuvir-Velpatasvir (EPCLUSA) 400-100 MG TABS Take 1 tablet by mouth daily.    [provider]  tiotropium (SPIRIVA) 18 MCG inhalation capsule Place 1 capsule (18 mcg total) into inhaler and inhale daily. 01/23/19   Mesner, Corene Cornea, MD  traZODone (DESYREL) 150 MG tablet Take 150 mg by mouth at bedtime.    [provider]    Allergies    Patient has no known allergies.  Review of Systems   Review of Systems  All other systems reviewed and are negative.   Physical Exam Updated Vital Signs BP 109/68   Pulse 62   Temp 98.2 F (36.8 C) (Oral)   Resp 15   Wt 106.6 kg   SpO2 98%   BMI 29.37 kg/m   Physical Exam Vitals and nursing note reviewed.  Constitutional:      Appearance: He is well-developed and well-nourished. He is ill-appearing.  HENT:     Head: Normocephalic and atraumatic.     Right Ear: External ear normal.     Left Ear: External ear normal.  Eyes:     General: No scleral icterus.       Right eye: No discharge.        Left eye: No discharge.     Conjunctiva/sclera: Conjunctivae normal.  Neck:     Trachea: No tracheal deviation.  Cardiovascular:     Rate and Rhythm: Normal rate and regular rhythm.     Pulses: Intact distal pulses.  Pulmonary:     Effort: Pulmonary effort is normal. No respiratory distress.     Breath sounds: Normal breath sounds. No stridor.  No wheezing or rales.  Abdominal:     General: Bowel sounds are normal. There is no distension.     Palpations: Abdomen is soft.     Tenderness: There is no abdominal tenderness. There is no guarding or rebound.  Musculoskeletal:        General: No tenderness or edema.     Cervical back:  Neck supple.  Skin:    General: Skin is warm and dry.     Findings: No rash.     Comments: Mottled in appearance  Neurological:     Mental Status: He is alert.     Cranial Nerves: No cranial nerve deficit (no facial droop, extraocular movements intact, no slurred speech).     Sensory: No sensory deficit.     Motor: No abnormal muscle tone or seizure activity.     Coordination: Coordination normal.     Deep Tendon Reflexes: Strength normal.  Psychiatric:        Mood and Affect: Mood and affect normal.     ED Results / Procedures / Treatments   Labs (all labs ordered are listed, but only abnormal results are displayed) Labs Reviewed  COMPREHENSIVE METABOLIC PANEL - Abnormal; Notable for the following components:      Result Value   Potassium 5.7 (*)    Chloride 97 (*)    Glucose, Bld 136 (*)    BUN 39 (*)    Creatinine, Ser 2.62 (*)    Calcium 10.6 (*)    Total Protein 8.3 (*)    GFR, Estimated 26 (*)    Anion gap 16 (*)    All other components within normal limits  LACTIC ACID, PLASMA - Abnormal; Notable for the following components:   Lactic Acid, Venous 5.4 (*)    All other components within normal limits  CBC WITH DIFFERENTIAL/PLATELET - Abnormal; Notable for the following components:   WBC 15.5 (*)    Hemoglobin 17.7 (*)    HCT 53.8 (*)    Neutro Abs 12.4 (*)    Abs Immature Granulocytes 0.11 (*)    All other components within normal limits  TROPONIN I (HIGH SENSITIVITY) - Abnormal; Notable for the following components:   Troponin I (High Sensitivity) 22 (*)    All other components within normal limits  SARS CORONAVIRUS 2 BY RT PCR (HOSPITAL ORDER, [facility])  CULTURE, BLOOD (ROUTINE X 2)  CULTURE, BLOOD (ROUTINE X 2)  URINE CULTURE  C DIFFICILE QUICK SCREEN W PCR REFLEX  GASTROINTESTINAL PANEL BY PCR, STOOL (REPLACES STOOL CULTURE)  LIPASE, BLOOD  PROTIME-INR  APTT  URINALYSIS, ROUTINE W REFLEX MICROSCOPIC  LACTIC ACID, PLASMA  TROPONIN I (HIGH SENSITIVITY)    EKG EKG Interpretation  Date/Time:  Thursday July 06 2020 18:49:55 EST Ventricular Rate:  75 PR Interval:    QRS Duration: 98 QT Interval:  413 QTC Calculation: 462 R Axis:   -77 Text Interpretation: Sinus rhythm Left anterior fascicular block Abnormal R-wave progression, early transition No significant change since last tracing Confirmed by Dorie Rank [phone]) on 07/06/2020 6:53:14 PM   Radiology CT ABDOMEN PELVIS WO CONTRAST  Result Date: 07/06/2020 CLINICAL DATA:  Acute abdominal pain. Nausea, diarrhea, and weakness. EXAM: CT ABDOMEN AND PELVIS WITHOUT CONTRAST TECHNIQUE: Multidetector CT imaging of the abdomen and pelvis was performed following the standard protocol without IV contrast. COMPARISON:  CT 05/21/2019 FINDINGS: Lower chest: Minor basilar atelectasis. No confluent airspace disease. Coronary artery calcifications. Hepatobiliary: No focal hepatic abnormality. Cholecystectomy without biliary dilatation. Pancreas: Parenchymal atrophy. No ductal dilatation or inflammation. Spleen: Normal in size without focal abnormality. Adrenals/Urinary Tract: Normal adrenal glands. Postsurgical change in the upper right kidney post partial nephrectomy. No evidence of recurrent mass. No hydronephrosis. No perinephric edema. No renal calculi. Urinary bladder is minimally distended. Wall thickening is likely related to degree of distension. Stomach/Bowel: Fluid distended stomach.  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 Few fluid-filled pelvic bowel loops without abnormal distension or inflammation. Appendix is normal. Liquid stool in  the cecum and ascending colon, also distal descending and sigmoid colon. Transverse colon is decompressed which limits assessment for wall thickening. No other colonic wall thickening is seen. Occasional sigmoid diverticula without diverticulitis. Vascular/Lymphatic: Calcified aortic atherosclerosis. Questionable vessel thickening of the infrarenal aorta, stable. Infrarenal aortic aneurysm at 3.4 cm. This is unchanged from prior. Moderate branch atherosclerosis. No enlarged lymph nodes in the abdomen or pelvis. Small paraesophageal nodes adjacent to the distal esophagus unchanged. Reproductive: Prostate is unremarkable. Other: No ascites or free air. Small fat containing umbilical hernia. Musculoskeletal: There are no acute or suspicious osseous abnormalities. Degenerative disc disease at L5-S1. IMPRESSION: 1. Liquid stool in the colon, can be seen with diarrheal illness. No bowel obstruction or inflammation. 2. Post right partial nephrectomy without evidence of recurrent mass. 3. Unchanged infrarenal aortic aneurysm at 3.4 cm. Recommend follow-up every 3 years. This recommendation follows ACR consensus guidelines: White Paper of the ACR Incidental Findings Committee II on Vascular Findings. J Am Coll Radiol 2013; 10:789-794. Aortic Atherosclerosis (ICD10-I70.0). Electronically Signed   By: Keith Rake M.D.   On: 07/06/2020 21:56   DG Chest Port 1 View  Result Date: 07/06/2020 CLINICAL DATA:  70 year old male with concern for sepsis. EXAM: PORTABLE CHEST 1 VIEW COMPARISON:  Chest radiograph dated 05/20/2019. FINDINGS: No focal consolidation, pleural effusion or pneumothorax. The cardiac silhouette is within limits. Atherosclerotic calcification of the aorta. No acute osseous pathology. IMPRESSION: No active disease. Electronically Signed   By: Anner Crete M.D.   On: 07/06/2020 18:53    Procedures .Critical Care Performed by: Dorie Rank, MD Authorized by: Dorie Rank, MD   Critical care provider  statement:    Critical care time (minutes):  45   Critical care was time spent personally by me on the following activities:  Discussions with consultants, evaluation of patient's response to treatment, examination of patient, ordering and performing treatments and interventions, ordering and review of laboratory studies, ordering and review of radiographic studies, pulse oximetry, re-evaluation of patient's condition, obtaining history from patient or surrogate and review of old charts     Medications Ordered in ED Medications  lactated ringers infusion (has no administration in time range)  lactated ringers bolus 1,000 mL (0 mLs Intravenous Stopped 07/06/20 2111)    And  lactated ringers bolus 1,000 mL (0 mLs Intravenous Stopped 07/06/20 2111)    And  lactated ringers bolus 1,000 mL (has no administration in time range)    And  lactated ringers bolus 1,000 mL (has no administration in time range)  ceFEPIme (MAXIPIME) 2 g in sodium chloride 0.9 % 100 mL IVPB (0 g Intravenous Stopped 07/06/20 1919)  metroNIDAZOLE (FLAGYL) IVPB 500 mg (0 mg Intravenous Stopped 07/06/20 2052)  ondansetron (ZOFRAN) injection 4 mg (4 mg Intravenous Given 07/06/20 1843)  ondansetron (ZOFRAN) injection 4 mg (4 mg Intravenous Given 07/06/20 2138)    ED Course  I have reviewed the triage vital signs and the nursing notes.  Pertinent labs & imaging results that were available during my care of the patient were reviewed by me and considered in my medical decision making (see chart for details).  Clinical Course as of 07/06/20 2206  Thu Jul 06, 2020  2015 Patient's initial labs show leukocytosis.  He also has elevated BUN and creatinine, increased compared to previous [JK]  2016 Lactic acid level  is also elevated. [JK]  2016 Blood pressure is improving with IV hydration.  Most recent blood pressure 109/81 [JK]  2205 Covid test is negative [JK]  2205 CT scan shows liquid stool in the colon.  No signs of colitis or  diverticulitis [JK]    Clinical Course User Index [JK] Dorie Rank, MD   MDM Rules/Calculators/A&P                          Patient presented with complaints of acute diarrhea and hypotension.  Patient was treated with IV fluids as well as antiemetics.  Patient's laboratory tests were notable for an elevated lactic acid level.  5.4.   patient also had leukocytosis.  Symptoms were concerning for the possibility of sepsis so patient was started on antibiotics.  IV fluid bolus per sepsis protocol.  Patient's metabolic panel does show an AKI.  Patient also has slightly elevated troponin.  We will need to continue to follow.  Stool studies have been ordered.  CT scan is not showing any signs of any acute abnormality other than liquid stool.  At this time I doubt colitis or diverticulitis.  CT was performed without contrast but no findings to suggest bowel ischemia at this time.  I will consult the medical service for admission and further treatment considering his hypotension and lactic acidosis. Final Clinical Impression(s) / ED Diagnoses Final diagnoses:  Diarrhea of presumed infectious origin  AKI (acute kidney injury) ([place])  Lactic acidosis     Dorie Rank, MD 07/06/20 2209

## 2020-07-06 NOTE — ED Notes (Signed)
O2 found at 82% RA, 3L [place] applied, oxygen up to 94%.

## 2020-07-06 NOTE — Progress Notes (Signed)
A consult was received from an ED physician for cefepime per pharmacy dosing.  The patient's profile has been reviewed for ht/wt/allergies/indication/available labs.   A one time order has been placed for cefepime 2 g iv once per EDP.  Further antibiotics/pharmacy consults should be ordered by admitting physician if indicated.                       Thank you, Napoleon Form 07/06/2020  6:29 PM

## 2020-07-06 NOTE — Sepsis Progress Note (Signed)
Notified bedside nurse of need to draw repeat lactic acid.  Also requested updated weight to be placed in Epic.

## 2020-07-06 NOTE — ED Triage Notes (Signed)
Per EMS-coming from home-sudden onset diarrhea, nausea and weakness-hypotensive when [facility] arrived-starting complaining of CP during transport-325 mg of ASA giving in route-symptoms improve when laying flat

## 2020-07-06 NOTE — H&P (Signed)
History and Physical    Jonathan Wilson VOZ:[id number] DOB: 01-Jun-1951 DOA: 07/06/2020  PCP: Vicenta Aly, FNP  Patient coming from: Home.  Chief Complaint: Abdominal pain and diarrhea.  HPI: Jonathan Wilson is a 70 y.o. male with history of hypertension, chronic and disease stage II baseline creatinine around 1.5 in August 2020 started having multiple episodes of watery diarrhea with abdominal discomfort which was diffuse since this afternoon.  Denies any sick contacts or recent use of any antibiotics or recent travel.  Patient states about 6 weeks ago he was started on methadone which is only new medication.  In addition patient started having some chest discomfort which has been constant with no shortness of breath.  ED Course: In the ER patient was afebrile but blood pressure was in the low normal with lactic acid of 5.9.  CT scan shows diarrheal state.  Covid test negative.  Patient was given fluid bolus for sepsis protocol started on empiric antibiotics and admitted for further management.  Patient's creatinine has increased from 1.5-2.6.  WBC count was 15.4.  EKG shows normal sinus rhythm.  High sensitive troponin was 22 and 19.  Review of Systems: As per HPI, rest all negative.   Past Medical History:  Diagnosis Date  . Anxiety   . Anxiety and depression 1998/10/01   worse after death of wife Apr 01, 2013  . Depression   . Diverticulosis of colon 09-30-09   seen on CT scan in September 30, 2009.   [name] Gout   . Hepatitis C before 10-01-1998  . Hyperlipidemia   . Hypertension   . Influenza A 06/2013   acute resp failure, did not require intubation.  . Obesity    300 # in 09-30-08, BMI 34 in 04/2013.   [name] Pneumonia    recurrent episodes   . [place] 1998-10-01   s/e of treatment for Hepatitis C.    . Renal cancer ([place])   . Right lower lobe lung mass 05/2008   resolved on follow up CT 01-Oct-2010.   . Vitamin D deficiency     Past Surgical History:  Procedure Laterality Date  . CHOLECYSTECTOMY N/A 01/14/2014    Procedure: LAPAROSCOPIC CHOLECYSTECTOMY WITH INTRAOPERATIVE CHOLANGIOGRAM;  Surgeon: Shann Medal, MD;  Location: [facility];  Service: General;  Laterality: N/A;  . MANDIBLE FRACTURE SURGERY    . ROBOTIC ASSITED PARTIAL NEPHRECTOMY Right 08/07/2015   Procedure: XI ROBOTIC ASSISTED RIGHT PARTIAL NEPHRECTOMY;  Surgeon: Cleon Gustin, MD;  Location: [facility];  Service: Urology;  Laterality: Right;     reports that he has quit smoking. His smoking use included cigarettes. He has a 10.00 pack-year smoking history. He has never used smokeless tobacco. He reports that he does not drink alcohol and does not use drugs.  No Known Allergies  Family History  Problem Relation Age of Onset  . Diabetes Mother   . Arthritis Mother   . Cancer Father        pancreatic cancer    Prior to Admission medications   Medication Sig Start Date End Date Taking? Authorizing Provider  allopurinol (ZYLOPRIM) 100 MG tablet Take 300 mg by mouth daily.   Yes [provider]  lisinopril-hydrochlorothiazide (PRINZIDE,ZESTORETIC) 20-12.5 MG tablet Take 2 tablets by mouth daily. 11/24/16  Yes Thurnell Lose, MD  Multiple Vitamin (MULTIVITAMIN WITH MINERALS) TABS tablet Take 1 tablet by mouth daily.   Yes [provider]  PARoxetine (PAXIL) 40 MG tablet Take 40 mg by mouth daily.  Yes [provider]    Physical Exam: Constitutional: Moderately built and nourished. Vitals:   07/06/20 2100 07/06/20 2129 07/06/20 2130 07/06/20 2207  BP: 134/76  109/68 104/67  Pulse: 65  62 60  Resp: 13  15 16   Temp:      TempSrc:      SpO2: 99%  98% 99%  Weight:  106.6 kg     Eyes: Anicteric no pallor. ENMT: No discharge from the ears eyes nose or mouth. Neck: No mass felt.  No neck rigidity. Respiratory: No rhonchi or crepitations. Cardiovascular: S1-S2 heard. Abdomen: Soft nontender bowel sounds present. Musculoskeletal: No edema. Skin: No rash. Neurologic: Alert awake oriented to time place and  person.  Moves all extremities. Psychiatric: Appears normal.  Normal affect.   Labs on Admission: I have personally reviewed following labs and imaging studies  CBC: Recent Labs  Lab 07/06/20 1824  WBC 15.5*  NEUTROABS 12.4*  HGB 17.7*  HCT 53.8*  MCV 97.5  PLT [id number]   Basic Metabolic Panel: Recent Labs  Lab 07/06/20 1814  NA 142  K 5.7*  CL 97*  CO2 29  GLUCOSE 136*  BUN 39*  CREATININE 2.62*  CALCIUM 10.6*   GFR: CrCl cannot be calculated (Unknown ideal weight.). Liver Function Tests: Recent Labs  Lab 07/06/20 1814  AST 31  ALT 23  ALKPHOS 84  BILITOT 1.0  PROT 8.3*  ALBUMIN 4.8   Recent Labs  Lab 07/06/20 1814  LIPASE 40   No results for input(s): AMMONIA in the last 168 hours. Coagulation Profile: Recent Labs  Lab 07/06/20 1824  INR 1.0   Cardiac Enzymes: No results for input(s): CKTOTAL, CKMB, CKMBINDEX, TROPONINI in the last 168 hours. BNP (last 3 results) No results for input(s): PROBNP in the last 8760 hours. HbA1C: No results for input(s): HGBA1C in the last 72 hours. CBG: No results for input(s): GLUCAP in the last 168 hours. Lipid Profile: No results for input(s): CHOL, HDL, LDLCALC, TRIG, CHOLHDL, LDLDIRECT in the last 72 hours. Thyroid Function Tests: No results for input(s): TSH, T4TOTAL, FREET4, T3FREE, THYROIDAB in the last 72 hours. Anemia Panel: No results for input(s): VITAMINB12, FOLATE, FERRITIN, TIBC, IRON, RETICCTPCT in the last 72 hours. Urine analysis:    Component Value Date/Time   COLORURINE AMBER (A) 11/20/2016 2316   APPEARANCEUR CLOUDY (A) 11/20/2016 2316   LABSPEC 1.023 11/20/2016 2316   PHURINE 5.0 11/20/2016 2316   GLUCOSEU NEGATIVE 11/20/2016 2316   HGBUR NEGATIVE 11/20/2016 2316   HGBUR negative 03/07/2009 0849   BILIRUBINUR SMALL (A) 11/20/2016 2316   KETONESUR 5 (A) 11/20/2016 2316   PROTEINUR 30 (A) 11/20/2016 2316   UROBILINOGEN 1.0 01/02/2015 1353   NITRITE NEGATIVE 11/20/2016 2316   LEUKOCYTESUR  NEGATIVE 11/20/2016 2316   Sepsis Labs: @LABRCNTIP (procalcitonin:4,lacticidven:4) ) Recent Results (from the past 240 hour(s))  SARS Coronavirus 2 by RT PCR (hospital order, performed in [place] hospital lab) Nasopharyngeal Nasopharyngeal Swab     Status: None   Collection Time: 07/06/20  6:49 PM   Specimen: Nasopharyngeal Swab  Result Value Ref Range Status   SARS Coronavirus 2 NEGATIVE NEGATIVE Final    Comment: (NOTE) SARS-CoV-2 target nucleic acids are NOT DETECTED.  The SARS-CoV-2 RNA is generally detectable in upper and lower respiratory specimens during the acute phase of infection. The lowest concentration of SARS-CoV-2 viral copies this assay can detect is 250 copies / mL. A negative result does not preclude SARS-CoV-2 infection and should not be used as the  sole basis for treatment or other patient management decisions.  A negative result may occur with improper specimen collection / handling, submission of specimen other than nasopharyngeal swab, presence of viral mutation(s) within the areas targeted by this assay, and inadequate number of viral copies (<250 copies / mL). A negative result must be combined with clinical observations, patient history, and epidemiological information.  Fact Sheet for Patients:   StrictlyIdeas.no  Fact Sheet for Healthcare Providers: BankingDealers.co.za  This test is not yet approved or  cleared by the [place] FDA and has been authorized for detection and/or diagnosis of SARS-CoV-2 by FDA under an Emergency Use Authorization (EUA).  This EUA will remain in effect (meaning this test can be used) for the duration of the COVID-19 declaration under Section 564(b)(1) of the Act, 21 U.S.C. section 360bbb-3(b)(1), unless the authorization is terminated or revoked sooner.  Performed at [facility], [place] [address] [place], [place] [address]      Radiological  Exams on Admission: CT ABDOMEN PELVIS WO CONTRAST  Result Date: 07/06/2020 CLINICAL DATA:  Acute abdominal pain. Nausea, diarrhea, and weakness. EXAM: CT ABDOMEN AND PELVIS WITHOUT CONTRAST TECHNIQUE: Multidetector CT imaging of the abdomen and pelvis was performed following the standard protocol without IV contrast. COMPARISON:  CT 05/21/2019 FINDINGS: Lower chest: Minor basilar atelectasis. No confluent airspace disease. Coronary artery calcifications. Hepatobiliary: No focal hepatic abnormality. Cholecystectomy without biliary dilatation. Pancreas: Parenchymal atrophy. No ductal dilatation or inflammation. Spleen: Normal in size without focal abnormality. Adrenals/Urinary Tract: Normal adrenal glands. Postsurgical change in the upper right kidney post partial nephrectomy. No evidence of recurrent mass. No hydronephrosis. No perinephric edema. No renal calculi. Urinary bladder is minimally distended. Wall thickening is likely related to degree of distension. Stomach/Bowel: Fluid distended stomach. 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 Few fluid-filled pelvic bowel loops without abnormal distension or inflammation. Appendix is normal. Liquid stool in the cecum and ascending colon, also distal descending and sigmoid colon. Transverse colon is decompressed which limits assessment for wall thickening. No other colonic wall thickening is seen. Occasional sigmoid diverticula without diverticulitis. Vascular/Lymphatic: Calcified aortic atherosclerosis. Questionable vessel thickening of the infrarenal aorta, stable. Infrarenal aortic aneurysm at 3.4 cm. This is unchanged from prior. Moderate branch atherosclerosis. No enlarged lymph nodes in the abdomen or pelvis. Small paraesophageal nodes adjacent to the distal esophagus unchanged. Reproductive: Prostate is unremarkable. Other: No ascites or free air. Small fat containing umbilical hernia. Musculoskeletal: There are no  acute or suspicious osseous abnormalities. Degenerative disc disease at L5-S1. IMPRESSION: 1. Liquid stool in the colon, can be seen with diarrheal illness. No bowel obstruction or inflammation. 2. Post right partial nephrectomy without evidence of recurrent mass. 3. Unchanged infrarenal aortic aneurysm at 3.4 cm. Recommend follow-up every 3 years. This recommendation follows ACR consensus guidelines: White Paper of the ACR Incidental Findings Committee II on Vascular Findings. J Am Coll Radiol 2013; 10:789-794. Aortic Atherosclerosis (ICD10-I70.0). Electronically Signed   By: Keith Rake M.D.   On: 07/06/2020 21:56   DG Chest Port 1 View  Result Date: 07/06/2020 CLINICAL DATA:  70 year old male with concern for sepsis. EXAM: PORTABLE CHEST 1 VIEW COMPARISON:  Chest radiograph dated 05/20/2019. FINDINGS: No focal consolidation, pleural effusion or pneumothorax. The cardiac silhouette is within limits. Atherosclerotic calcification of the aorta. No acute osseous pathology. IMPRESSION: No active disease. Electronically Signed   By: Anner Crete M.D.   On: 07/06/2020 18:53    EKG: Independently reviewed.  Normal  sinus rhythm.  Assessment/Plan Principal Problem:   SIRS (systemic inflammatory response syndrome) (HCC) Active Problems:   COPD (chronic obstructive pulmonary disease) (HCC)   Essential hypertension   History of renal cell cancer   AKI (acute kidney injury) ([place])   Diarrhea    1. SIRS secondary to diarrhea cause not clear we will check GI pathogen panel continue with IV fluids follow lactic acid levels procalcitonin cultures and empiric antibiotics for now. 2. Chest pain will trend cardiac markers check 2D echo. 3. Hypertension holding antihypertensives due to low normal blood pressure and also worsening renal function.  Follow blood pressure trends.  As needed IV hydralazine. 4. Acute on chronic kidney disease stage III creatinine increased likely from diarrhea and also patient  using lisinopril and hydrochlorothiazide.  Will hold antihypertensives.  Continue hydration and follow metabolic panel. 5. History of gout on allopurinol. 6. Recently started on methadone discussed with pharmacy about confirming and starting once confirmed.   DVT prophylaxis: Heparin. Code Status: Full code. Family Communication: Discussed with patient. Disposition Plan: Home. Consults called: None. Admission status: Observation.   Rise Patience MD [facility] Pager [phone].  If 7PM-7AM, please contact night-coverage [email or web address] Password [email or web address]  07/06/2020, 11:22 PM

## 2020-07-07 ENCOUNTER — Inpatient Hospital Stay (HOSPITAL_COMMUNITY): Payer: 59

## 2020-07-07 ENCOUNTER — Observation Stay (HOSPITAL_COMMUNITY): Payer: 59

## 2020-07-07 DIAGNOSIS — F419 Anxiety disorder, unspecified: Secondary | ICD-10-CM | POA: Diagnosis present

## 2020-07-07 DIAGNOSIS — Z9049 Acquired absence of other specified parts of digestive tract: Secondary | ICD-10-CM | POA: Diagnosis not present

## 2020-07-07 DIAGNOSIS — K5791 Diverticulosis of intestine, part unspecified, without perforation or abscess with bleeding: Secondary | ICD-10-CM | POA: Diagnosis present

## 2020-07-07 DIAGNOSIS — Z87891 Personal history of nicotine dependence: Secondary | ICD-10-CM | POA: Diagnosis not present

## 2020-07-07 DIAGNOSIS — I959 Hypotension, unspecified: Secondary | ICD-10-CM | POA: Diagnosis not present

## 2020-07-07 DIAGNOSIS — I719 Aortic aneurysm of unspecified site, without rupture: Secondary | ICD-10-CM | POA: Diagnosis present

## 2020-07-07 DIAGNOSIS — F1190 Opioid use, unspecified, uncomplicated: Secondary | ICD-10-CM | POA: Diagnosis present

## 2020-07-07 DIAGNOSIS — R339 Retention of urine, unspecified: Secondary | ICD-10-CM | POA: Diagnosis present

## 2020-07-07 DIAGNOSIS — R079 Chest pain, unspecified: Secondary | ICD-10-CM | POA: Diagnosis not present

## 2020-07-07 DIAGNOSIS — M109 Gout, unspecified: Secondary | ICD-10-CM | POA: Diagnosis present

## 2020-07-07 DIAGNOSIS — Z7951 Long term (current) use of inhaled steroids: Secondary | ICD-10-CM | POA: Diagnosis not present

## 2020-07-07 DIAGNOSIS — A09 Infectious gastroenteritis and colitis, unspecified: Secondary | ICD-10-CM | POA: Diagnosis present

## 2020-07-07 DIAGNOSIS — Z85528 Personal history of other malignant neoplasm of kidney: Secondary | ICD-10-CM | POA: Diagnosis not present

## 2020-07-07 DIAGNOSIS — J449 Chronic obstructive pulmonary disease, unspecified: Secondary | ICD-10-CM | POA: Diagnosis present

## 2020-07-07 DIAGNOSIS — R197 Diarrhea, unspecified: Secondary | ICD-10-CM | POA: Diagnosis present

## 2020-07-07 DIAGNOSIS — I129 Hypertensive chronic kidney disease with stage 1 through stage 4 chronic kidney disease, or unspecified chronic kidney disease: Secondary | ICD-10-CM | POA: Diagnosis present

## 2020-07-07 DIAGNOSIS — E861 Hypovolemia: Secondary | ICD-10-CM | POA: Diagnosis present

## 2020-07-07 DIAGNOSIS — F32A Depression, unspecified: Secondary | ICD-10-CM | POA: Diagnosis present

## 2020-07-07 DIAGNOSIS — E872 Acidosis: Secondary | ICD-10-CM | POA: Diagnosis present

## 2020-07-07 DIAGNOSIS — E785 Hyperlipidemia, unspecified: Secondary | ICD-10-CM | POA: Diagnosis present

## 2020-07-07 DIAGNOSIS — R6510 Systemic inflammatory response syndrome (SIRS) of non-infectious origin without acute organ dysfunction: Secondary | ICD-10-CM | POA: Diagnosis present

## 2020-07-07 DIAGNOSIS — N179 Acute kidney failure, unspecified: Secondary | ICD-10-CM | POA: Diagnosis present

## 2020-07-07 DIAGNOSIS — Z20822 Contact with and (suspected) exposure to covid-19: Secondary | ICD-10-CM | POA: Diagnosis present

## 2020-07-07 DIAGNOSIS — N1831 Chronic kidney disease, stage 3a: Secondary | ICD-10-CM | POA: Diagnosis present

## 2020-07-07 DIAGNOSIS — Z79899 Other long term (current) drug therapy: Secondary | ICD-10-CM | POA: Diagnosis not present

## 2020-07-07 DIAGNOSIS — Z905 Acquired absence of kidney: Secondary | ICD-10-CM | POA: Diagnosis not present

## 2020-07-07 DIAGNOSIS — J42 Unspecified chronic bronchitis: Secondary | ICD-10-CM | POA: Diagnosis not present

## 2020-07-07 LAB — URINALYSIS, ROUTINE W REFLEX MICROSCOPIC
Bilirubin Urine: NEGATIVE
Glucose, UA: NEGATIVE mg/dL
Hgb urine dipstick: NEGATIVE
Ketones, ur: 5 mg/dL — AB
Nitrite: NEGATIVE
Protein, ur: NEGATIVE mg/dL
Specific Gravity, Urine: 1.018 (ref 1.005–1.030)
pH: 5 (ref 5.0–8.0)

## 2020-07-07 LAB — ECHOCARDIOGRAM COMPLETE
Area-P 1/2: 3.85 cm2
Calc EF: 67 %
Height: 75 in
S' Lateral: 3.1 cm
Single Plane A2C EF: 66.2 %
Single Plane A4C EF: 69.5 %
Weight: 3760 oz

## 2020-07-07 LAB — COMPREHENSIVE METABOLIC PANEL
ALT: 17 U/L (ref 0–44)
AST: 18 U/L (ref 15–41)
Albumin: 3.4 g/dL — ABNORMAL LOW (ref 3.5–5.0)
Alkaline Phosphatase: 52 U/L (ref 38–126)
Anion gap: 10 (ref 5–15)
BUN: 43 mg/dL — ABNORMAL HIGH (ref 8–23)
CO2: 27 mmol/L (ref 22–32)
Calcium: 8.8 mg/dL — ABNORMAL LOW (ref 8.9–10.3)
Chloride: 100 mmol/L (ref 98–111)
Creatinine, Ser: 2.72 mg/dL — ABNORMAL HIGH (ref 0.61–1.24)
GFR, Estimated: 25 mL/min — ABNORMAL LOW (ref >60)
Glucose, Bld: 87 mg/dL (ref 70–99)
Potassium: 4.7 mmol/L (ref 3.5–5.1)
Sodium: 137 mmol/L (ref 135–145)
Total Bilirubin: 0.5 mg/dL (ref 0.3–1.2)
Total Protein: 5.9 g/dL — ABNORMAL LOW (ref 6.5–8.1)

## 2020-07-07 LAB — CBC
HCT: 42.1 % (ref 39.0–52.0)
HCT: 36.9 % — ABNORMAL LOW (ref 39.0–52.0)
Hemoglobin: 13.8 g/dL (ref 13.0–17.0)
Hemoglobin: 12.3 g/dL — ABNORMAL LOW (ref 13.0–17.0)
MCH: 31.9 pg (ref 26.0–34.0)
MCH: 32.5 pg (ref 26.0–34.0)
MCHC: 32.8 g/dL (ref 30.0–36.0)
MCHC: 33.3 g/dL (ref 30.0–36.0)
MCV: 97.5 fL (ref 80.0–100.0)
MCV: 97.4 fL (ref 80.0–100.0)
Platelets: 216 10*3/uL (ref 150–400)
Platelets: 164 10*3/uL (ref 150–400)
RBC: 4.32 MIL/uL (ref 4.22–5.81)
RBC: 3.79 MIL/uL — ABNORMAL LOW (ref 4.22–5.81)
RDW: 12.8 % (ref 11.5–15.5)
RDW: 12.9 % (ref 11.5–15.5)
WBC: 10.3 10*3/uL (ref 4.0–10.5)
WBC: 8.1 10*3/uL (ref 4.0–10.5)
nRBC: 0 % (ref 0.0–0.2)
nRBC: 0 % (ref 0.0–0.2)

## 2020-07-07 LAB — LACTIC ACID, PLASMA
Lactic Acid, Venous: 1.2 mmol/L (ref 0.5–1.9)
Lactic Acid, Venous: 0.6 mmol/L (ref 0.5–1.9)
Lactic Acid, Venous: 2.1 mmol/L (ref 0.5–1.9)
Lactic Acid, Venous: 1.1 mmol/L (ref 0.5–1.9)
Lactic Acid, Venous: 2.1 mmol/L (ref 0.5–1.9)
Lactic Acid, Venous: 3.7 mmol/L (ref 0.5–1.9)

## 2020-07-07 LAB — CREATININE, SERUM
Creatinine, Ser: 2.8 mg/dL — ABNORMAL HIGH (ref 0.61–1.24)
GFR, Estimated: 24 mL/min — ABNORMAL LOW (ref >60)

## 2020-07-07 LAB — HIV ANTIBODY (ROUTINE TESTING W REFLEX): HIV Screen 4th Generation wRfx: NONREACTIVE

## 2020-07-07 LAB — PROCALCITONIN: Procalcitonin: 0.13 ng/mL

## 2020-07-07 MED ORDER — SODIUM CHLORIDE 0.9 % IV BOLUS
250.0000 mL | Freq: Once | INTRAVENOUS | Status: AC
  Administered 2020-07-07: 250 mL via INTRAVENOUS

## 2020-07-07 MED ORDER — CIPROFLOXACIN IN D5W 400 MG/200ML IV SOLN
400.0000 mg | Freq: Two times a day (BID) | INTRAVENOUS | Status: DC
  Administered 2020-07-07 – 2020-07-10 (×7): 400 mg via INTRAVENOUS
  Filled 2020-07-07 (×7): qty 200

## 2020-07-07 MED ORDER — METRONIDAZOLE IN NACL 5-0.79 MG/ML-% IV SOLN
500.0000 mg | Freq: Three times a day (TID) | INTRAVENOUS | Status: DC
  Administered 2020-07-07 – 2020-07-10 (×10): 500 mg via INTRAVENOUS
  Filled 2020-07-07 (×10): qty 100

## 2020-07-07 MED ORDER — HYDRALAZINE HCL 20 MG/ML IJ SOLN
10.0000 mg | INTRAMUSCULAR | Status: DC | PRN
  Filled 2020-07-07: qty 1

## 2020-07-07 MED ORDER — LACTATED RINGERS IV BOLUS
500.0000 mL | Freq: Once | INTRAVENOUS | Status: AC
  Administered 2020-07-07: 500 mL via INTRAVENOUS

## 2020-07-07 MED ORDER — METHADONE HCL 10 MG/ML PO CONC
120.0000 mg | Freq: Every day | ORAL | Status: DC
  Administered 2020-07-07 – 2020-07-10 (×4): 120 mg via ORAL
  Filled 2020-07-07 (×4): qty 12

## 2020-07-07 MED ORDER — LACTATED RINGERS IV SOLN: INTRAVENOUS | Status: AC

## 2020-07-07 MED ORDER — LACTATED RINGERS IV BOLUS
500.0000 mL | Freq: Once | INTRAVENOUS | Status: AC
  Administered 2020-07-08: 500 mL via INTRAVENOUS

## 2020-07-07 MED ORDER — CHLORHEXIDINE GLUCONATE CLOTH 2 % EX PADS
6.0000 | MEDICATED_PAD | Freq: Every day | CUTANEOUS | Status: DC
  Administered 2020-07-10: 6 via TOPICAL

## 2020-07-07 MED ORDER — SODIUM CHLORIDE 0.9 % IV BOLUS
1000.0000 mL | Freq: Once | INTRAVENOUS | Status: AC
  Administered 2020-07-07: 1000 mL via INTRAVENOUS

## 2020-07-07 NOTE — Progress Notes (Signed)
Pharmacy Antibiotic Note  Jonathan Wilson is a 70 y.o. male admitted on 07/06/2020 with intra-abdominal infection.  Pharmacy has been consulted for Cipro dosing.  Plan: Cipro 400mg  IV q12h Metronidazole per MD Need for further dosage adjustment appears unlikely at present.    Will sign off at this time.  Please reconsult if a change in clinical status warrants re-evaluation of dosage.   Height: 6\' 3"  (190.5 cm) Weight: 106.6 kg (235 lb) IBW/kg (Calculated) : 84.5  Temp (24hrs), Avg:98.2 F (36.8 C), Min:98.2 F (36.8 C), Max:98.2 F (36.8 C)  Recent Labs  Lab 07/06/20 1814 07/06/20 1824 07/06/20 2115 07/07/20 0251  WBC  --  15.5*  --  10.3  CREATININE 2.62*  --   --  2.80*  LATICACIDVEN  --  5.4* 2.5* 1.2    Estimated Creatinine Clearance: 33.3 mL/min (A) (by C-G formula based on SCr of 2.8 mg/dL (H)).    No Known Allergies  Thank you for allowing pharmacy to be a part of this patient's care.  Everette Rank, PharmD 07/07/2020 5:49 AM

## 2020-07-07 NOTE — ED Notes (Signed)
Date and time results received: 07/07/20 1243 (use smartphrase ".now" to insert current time)  Test: latic acid Critical Value: 2.1  Name of Provider Notified: kc, ramesh  Orders Received? Or Actions Taken?: aware

## 2020-07-07 NOTE — ED Notes (Signed)
Pt unable to urinate. This NT preformed a Bladder Scan revealed 86ML

## 2020-07-07 NOTE — ED Notes (Signed)
Attempted to call 4E to give report. Bed has not been assigned to nurse yet. Will call back

## 2020-07-07 NOTE — ED Notes (Signed)
Pt bladder scan yielded 61mL at this time. Pt refused I&O cath at this time as well.

## 2020-07-07 NOTE — ED Notes (Signed)
Date and time results received: 07/07/20 1747 (use smartphrase ".now" to insert current time)  Test: latic acid Critical Value: 2.1  Name of Provider Notified: Antonieta Pert  Orders Received? Or Actions Taken?: aware

## 2020-07-07 NOTE — Progress Notes (Signed)
PROGRESS NOTE    Jonathan Wilson  JOA:[id number] DOB: 24-Sep-1950 DOA: 07/06/2020 PCP: Vicenta Aly, FNP   Chief Complaint  Patient presents with  . Diarrhea    Brief Narrative: 70 year old male with hypertension, chronic kidney disease stage II CKDI, baseline  Creat 1.06 Jan 2019, on 120 mg methadone for chronic opiate use started 6 weeks ago admitted with multiple episodes of diarrhea 07/06/2020, and he also c.o some chest discomfort which has been constant with no shortness of breath.  In the ED labs showed acute renal failure, leukocytosis,EKG shows normal sinus rhythm.  High sensitive troponin was 22 and 19, blood pressure was in the low normal with lactic acid of 5.9.  CT scan shows diarrheal state.  Covid test negative.  Patient was given fluid bolus for sepsis protocol started on empiric antibiotics and admitted for further management.  Subjective: Seen and examined this morning.  He was asking for bedside commode for bowel movement.  Patient complains of low back pain/flank pain on the right side, asking for his methadone.  Denies any alcohol abuse. Appears thirsty Foley catheter was placed and had  450 ml urine in bag already  Assessment & Plan:  AKI on CKD IIIa: Baseline creatinine ~1.5 in 2020.  On admission 2.6 up trended i also having urine retention Foley catheter placed, suspect multifactorial from severe dehydration/diarrhea and also urine retention.  Having good urine output so we will continue on IV fluids and repeat BMP in a.m. UA stable.  Check renal ultrasound. Recent Labs    07/06/20 1814 07/07/20 0251 07/07/20 0621  BUN 39*  --  43*  CREATININE 2.62* 2.80* 2.72*   Diarrhea multiple episodes watery, pending C. difficile and GI panel.  Continue on enteric precaution.  Continue supportive measures with IV fluid hydration,CLD and diet as tolerated.  Continue on empiric Cipro/Flagyl for now.  Volume depletion/dehydration,orthostatic hypotension continue on aggressive IV  fluid hydration will give bolus 1 L x 1.  SIRS w/ leukocytosis/lactic acidosis-blood cx pending, procalcitonin was stable 0.13, UA without evidence of UTI.  Labs have improved.  Continue to monitor lactic acid level given patient's hypotension Recent Labs  Lab 07/06/20 1824 07/06/20 2115 07/07/20 0251 07/07/20 0621  WBC 15.5*  --  10.3 8.1  LATICACIDVEN 5.4* 2.5* 1.2 0.6  PROCALCITON  --   --  0.13  --    Lactic acidosis 5.4, resolved to 0.6 w/ ivf likely, from volume depletion dehydration,check serial LA.  Chest Pain, high sensitivity trops 22>19, subtle increase downtrending ,TTE pending.  This morning has no chest pain.  ACS rule out.  Monitor.  COPD: Respiratory status is stable.  Continue as needed bronchodilators.  Essential hypertension: Patient was hypotensive orthostatic this morning giving 1 L bolus.  Hx of Gout on allopurinol  Chronic methadone use for history of opiate abuse as per the patient.  Resume home methadone-decrease the dose due to hypotension  History of renal cell cancer   Pt's Body mass index is 29.37 kg/m.  Nutrition: Diet Order            Diet Heart Room service appropriate? Yes; Fluid consistency: Thin  Diet effective now                DVT prophylaxis: heparin injection 5,000 Units Start: 07/06/20 2330 Code Status:   Code Status: Full Code  Family Communication: plan of care discussed with patient at bedside.  Status is: admitted as Observation Patient will need at least 2 midnight stay for  ongoing management of volume depletion dehydration diarrhea and renal failure  Dispo: The patient is from: Home              Anticipated d/c is to: Home              Anticipated d/c date is: 2 days              Patient currently is not medically stable to d/c.   Difficult to place patient No  Consultants:see note  Procedures:see note  Culture/Microbiology    Component Value Date/Time   SDES  07/06/2020 2115    BLOOD RIGHT WRIST Performed at  [facility] [address], [place], [place] [address]    SPECREQUEST  07/06/2020 2115    BOTTLES DRAWN AEROBIC AND ANAEROBIC Blood Culture adequate volume Performed at [facility] [address], [place], [place] [address]    CULT  07/06/2020 2115    NO GROWTH < 12 HOURS Performed at [facility] [address], [place], [place] [address]    REPTSTATUS PENDING 07/06/2020 2115    Other culture-see note  Medications: Scheduled Meds: . allopurinol  300 mg Oral Daily  . heparin  5,000 Units Subcutaneous Q8H  . methadone  120 mg Oral Daily  . PARoxetine  40 mg Oral Daily   Continuous Infusions: . ciprofloxacin Stopped (07/07/20 0711)  . lactated ringers 150 mL/hr at 07/07/20 0925  . metronidazole Stopped (07/07/20 3299)    Antimicrobials: Anti-infectives (From admission, onward)   Start     Dose/Rate Route Frequency Ordered Stop   07/07/20 0600  metroNIDAZOLE (FLAGYL) IVPB 500 mg        500 mg 100 mL/hr over 60 Minutes Intravenous Every 8 hours 07/07/20 0546     07/07/20 0600  ciprofloxacin (CIPRO) IVPB 400 mg        400 mg 200 mL/hr over 60 Minutes Intravenous Every 12 hours 07/07/20 0549     07/06/20 1830  ceFEPIme (MAXIPIME) 2 g in sodium chloride 0.9 % 100 mL IVPB        2 g 200 mL/hr over 30 Minutes Intravenous  Once 07/06/20 1825 07/06/20 1919   07/06/20 1830  metroNIDAZOLE (FLAGYL) IVPB 500 mg        500 mg 100 mL/hr over 60 Minutes Intravenous  Once 07/06/20 1825 07/06/20 2052     Objective: Vitals: Today's Vitals   07/07/20 0800 07/07/20 0815 07/07/20 0830 07/07/20 1125  BP:  120/65  (!) 86/62  Pulse: (!) 55 62 (!) 57 61  Resp:  20  20  Temp:      TempSrc:      SpO2: 96% 98% 99% 97%  Weight:      Height:      PainSc:        Intake/Output Summary (Last 24 hours) at 07/07/2020 1136 Last data filed at 07/07/2020 0713 Gross per 24 hour  Intake 4444.64 ml  Output --  Net 4444.64 ml   Filed Weights    07/06/20 2129  Weight: 106.6 kg   Weight change:   Intake/Output from previous day: 02/03 0701 - 02/04 0700 In: 4200 [IV Piggyback:4200] Out: -  Intake/Output this shift: Total I/O In: 244.6 [IV Piggyback:244.6] Out: -  Filed Weights   07/06/20 2129  Weight: 106.6 kg    Examination: General exam: AAO X3,NAD, weak appearing. HEENT:Oral mucosa moist, Ear/Nose WNL grossly,dentition normal. Respiratory system: bilaterally clear,no wheezing or crackles,no use of accessory muscle, non  tender. Cardiovascular system: S1 & S2 +, regular, No JVD. Gastrointestinal system: Abdomen soft, NT,ND, BS+. Nervous System:Alert, awake, moving extremities and grossly nonfocal Extremities: No edema, distal peripheral pulses palpable.  Skin: No rashes,no icterus. MSK: Normal muscle bulk,tone, power  Data Reviewed: I have personally reviewed following labs and imaging studies CBC: Recent Labs  Lab 07/06/20 1824 07/07/20 0251 07/07/20 0621  WBC 15.5* 10.3 8.1  NEUTROABS 12.4*  --   --   HGB 17.7* 13.8 12.3*  HCT 53.8* 42.1 36.9*  MCV 97.5 97.5 97.4  PLT 317 216 [id number]   Basic Metabolic Panel: Recent Labs  Lab 07/06/20 1814 07/07/20 0251 07/07/20 0621  NA 142  --  137  K 5.7*  --  4.7  CL 97*  --  100  CO2 29  --  27  GLUCOSE 136*  --  87  BUN 39*  --  43*  CREATININE 2.62* 2.80* 2.72*  CALCIUM 10.6*  --  8.8*   GFR: Estimated Creatinine Clearance: 34.3 mL/min (A) (by C-G formula based on SCr of 2.72 mg/dL (H)). Liver Function Tests: Recent Labs  Lab 07/06/20 1814 07/07/20 0621  AST 31 18  ALT 23 17  ALKPHOS 84 52  BILITOT 1.0 0.5  PROT 8.3* 5.9*  ALBUMIN 4.8 3.4*   Recent Labs  Lab 07/06/20 1814  LIPASE 40   No results for input(s): AMMONIA in the last 168 hours. Coagulation Profile: Recent Labs  Lab 07/06/20 1824  INR 1.0   Cardiac Enzymes: No results for input(s): CKTOTAL, CKMB, CKMBINDEX, TROPONINI in the last 168 hours. BNP (last 3 results) No results for  input(s): PROBNP in the last 8760 hours. HbA1C: No results for input(s): HGBA1C in the last 72 hours. CBG: No results for input(s): GLUCAP in the last 168 hours. Lipid Profile: No results for input(s): CHOL, HDL, LDLCALC, TRIG, CHOLHDL, LDLDIRECT in the last 72 hours. Thyroid Function Tests: No results for input(s): TSH, T4TOTAL, FREET4, T3FREE, THYROIDAB in the last 72 hours. Anemia Panel: No results for input(s): VITAMINB12, FOLATE, FERRITIN, TIBC, IRON, RETICCTPCT in the last 72 hours. Sepsis Labs: Recent Labs  Lab 07/06/20 1824 07/06/20 2115 07/07/20 0251 07/07/20 0621  PROCALCITON  --   --  0.13  --   LATICACIDVEN 5.4* 2.5* 1.2 0.6    Recent Results (from the past 240 hour(s))  Blood Culture (routine x 2)     Status: None (Preliminary result)   Collection Time: 07/06/20  6:24 PM   Specimen: BLOOD  Result Value Ref Range Status   Specimen Description   Final    BLOOD RIGHT ANTECUBITAL Performed at [facility] [address] [place], [place] [address]    Special Requests   Final    BOTTLES DRAWN AEROBIC AND ANAEROBIC Blood Culture adequate volume Performed at [facility] [address] [place], [place] [address]    Culture   Final    NO GROWTH < 12 HOURS Performed at [facility] [address], [place], [place] [address]    Report Status PENDING  Incomplete  SARS Coronavirus 2 by RT PCR (hospital order, performed in [facility] hospital lab) Nasopharyngeal Nasopharyngeal Swab     Status: None   Collection Time: 07/06/20  6:49 PM   Specimen: Nasopharyngeal Swab  Result Value Ref Range Status   SARS Coronavirus 2 NEGATIVE NEGATIVE Final    Comment: (NOTE) SARS-CoV-2 target nucleic acids are NOT DETECTED.  The SARS-CoV-2 RNA is generally detectable in  upper and lower respiratory specimens during the acute phase of infection. The lowest concentration of SARS-CoV-2 viral copies this assay can detect is 250 copies /  mL. A negative result does not preclude SARS-CoV-2 infection and should not be used as the sole basis for treatment or other patient management decisions.  A negative result may occur with improper specimen collection / handling, submission of specimen other than nasopharyngeal swab, presence of viral mutation(s) within the areas targeted by this assay, and inadequate number of viral copies (<250 copies / mL). A negative result must be combined with clinical observations, patient history, and epidemiological information.  Fact Sheet for Patients:   StrictlyIdeas.no  Fact Sheet for Healthcare Providers: BankingDealers.co.za  This test is not yet approved or  cleared by the [place] FDA and has been authorized for detection and/or diagnosis of SARS-CoV-2 by FDA under an Emergency Use Authorization (EUA).  This EUA will remain in effect (meaning this test can be used) for the duration of the COVID-19 declaration under Section 564(b)(1) of the Act, 21 U.S.C. section 360bbb-3(b)(1), unless the authorization is terminated or revoked sooner.  Performed at [facility], [facility] [address] [place], [place] [address]   Blood Culture (routine x 2)     Status: None (Preliminary result)   Collection Time: 07/06/20  9:15 PM   Specimen: BLOOD  Result Value Ref Range Status   Specimen Description   Final    BLOOD RIGHT WRIST Performed at [facility] [address] [place], [place] [address]    Special Requests   Final    BOTTLES DRAWN AEROBIC AND ANAEROBIC Blood Culture adequate volume Performed at [facility] [address] [place], [place] [address]    Culture   Final    NO GROWTH < 12 HOURS Performed at [facility] [address] [place], [place] [address]    Report Status PENDING  Incomplete     Radiology Studies: CT ABDOMEN PELVIS WO CONTRAST  Result Date:  07/06/2020 CLINICAL DATA:  Acute abdominal pain. Nausea, diarrhea, and weakness. EXAM: CT ABDOMEN AND PELVIS WITHOUT CONTRAST TECHNIQUE: Multidetector CT imaging of the abdomen and pelvis was performed following the standard protocol without IV contrast. COMPARISON:  CT 05/21/2019 FINDINGS: Lower chest: Minor basilar atelectasis. No confluent airspace disease. Coronary artery calcifications. Hepatobiliary: No focal hepatic abnormality. Cholecystectomy without biliary dilatation. Pancreas: Parenchymal atrophy. No ductal dilatation or inflammation. Spleen: Normal in size without focal abnormality. Adrenals/Urinary Tract: Normal adrenal glands. Postsurgical change in the upper right kidney post partial nephrectomy. No evidence of recurrent mass. No hydronephrosis. No perinephric edema. No renal calculi. Urinary bladder is minimally distended. Wall thickening is likely related to degree of distension. Stomach/Bowel: Fluid distended stomach. 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 Few fluid-filled pelvic bowel loops without abnormal distension or inflammation. Appendix is normal. Liquid stool in the cecum and ascending colon, also distal descending and sigmoid colon. Transverse colon is decompressed which limits assessment for wall thickening. No other colonic wall thickening is seen. Occasional sigmoid diverticula without diverticulitis. Vascular/Lymphatic: Calcified aortic atherosclerosis. Questionable vessel thickening of the infrarenal aorta, stable. Infrarenal aortic aneurysm at 3.4 cm. This is unchanged from prior. Moderate branch atherosclerosis. No enlarged lymph nodes in the abdomen or pelvis. Small paraesophageal nodes adjacent to the distal esophagus unchanged. Reproductive: Prostate is unremarkable. Other: No ascites or free air. Small fat containing umbilical hernia. Musculoskeletal: There are no acute or suspicious osseous abnormalities.  Degenerative disc disease at  L5-S1. IMPRESSION: 1. Liquid stool in the colon, can be seen with diarrheal illness. No bowel obstruction or inflammation. 2. Post right partial nephrectomy without evidence of recurrent mass. 3. Unchanged infrarenal aortic aneurysm at 3.4 cm. Recommend follow-up every 3 years. This recommendation follows ACR consensus guidelines: White Paper of the ACR Incidental Findings Committee II on Vascular Findings. J Am Coll Radiol 2013; 10:789-794. Aortic Atherosclerosis (ICD10-I70.0). Electronically Signed   By: Keith Rake M.D.   On: 07/06/2020 21:56   DG Chest Port 1 View  Result Date: 07/06/2020 CLINICAL DATA:  70 year old male with concern for sepsis. EXAM: PORTABLE CHEST 1 VIEW COMPARISON:  Chest radiograph dated 05/20/2019. FINDINGS: No focal consolidation, pleural effusion or pneumothorax. The cardiac silhouette is within limits. Atherosclerotic calcification of the aorta. No acute osseous pathology. IMPRESSION: No active disease. Electronically Signed   By: Anner Crete M.D.   On: 07/06/2020 18:53     LOS: 0 days   Antonieta Pert, MD [facility]  07/07/2020, 11:36 AM

## 2020-07-07 NOTE — ED Notes (Signed)
MD aware of BP 86/62 with patient sitting on side of bed

## 2020-07-07 NOTE — Progress Notes (Signed)
  Echocardiogram 2D Echocardiogram has been performed.  Bobbye Charleston 07/07/2020, 2:53 PM

## 2020-07-08 ENCOUNTER — Encounter (HOSPITAL_COMMUNITY): Payer: Self-pay | Admitting: Internal Medicine

## 2020-07-08 ENCOUNTER — Other Ambulatory Visit: Payer: Self-pay

## 2020-07-08 LAB — BASIC METABOLIC PANEL
Anion gap: 5 (ref 5–15)
Anion gap: 6 (ref 5–15)
BUN: 34 mg/dL — ABNORMAL HIGH (ref 8–23)
BUN: 29 mg/dL — ABNORMAL HIGH (ref 8–23)
CO2: 26 mmol/L (ref 22–32)
CO2: 31 mmol/L (ref 22–32)
Calcium: 8.5 mg/dL — ABNORMAL LOW (ref 8.9–10.3)
Calcium: 9.1 mg/dL (ref 8.9–10.3)
Chloride: 103 mmol/L (ref 98–111)
Chloride: 106 mmol/L (ref 98–111)
Creatinine, Ser: 1.81 mg/dL — ABNORMAL HIGH (ref 0.61–1.24)
Creatinine, Ser: 1.57 mg/dL — ABNORMAL HIGH (ref 0.61–1.24)
GFR, Estimated: 40 mL/min — ABNORMAL LOW (ref >60)
GFR, Estimated: 48 mL/min — ABNORMAL LOW (ref >60)
Glucose, Bld: 70 mg/dL (ref 70–99)
Glucose, Bld: 100 mg/dL — ABNORMAL HIGH (ref 70–99)
Potassium: 4.7 mmol/L (ref 3.5–5.1)
Potassium: 5.1 mmol/L (ref 3.5–5.1)
Sodium: 134 mmol/L — ABNORMAL LOW (ref 135–145)
Sodium: 143 mmol/L (ref 135–145)

## 2020-07-08 LAB — LACTIC ACID, PLASMA: Lactic Acid, Venous: 0.9 mmol/L (ref 0.5–1.9)

## 2020-07-08 LAB — CBC
HCT: 33.6 % — ABNORMAL LOW (ref 39.0–52.0)
Hemoglobin: 10.7 g/dL — ABNORMAL LOW (ref 13.0–17.0)
MCH: 32.2 pg (ref 26.0–34.0)
MCHC: 31.8 g/dL (ref 30.0–36.0)
MCV: 101.2 fL — ABNORMAL HIGH (ref 80.0–100.0)
Platelets: 135 10*3/uL — ABNORMAL LOW (ref 150–400)
RBC: 3.32 MIL/uL — ABNORMAL LOW (ref 4.22–5.81)
RDW: 12.5 % (ref 11.5–15.5)
WBC: 6 10*3/uL (ref 4.0–10.5)
nRBC: 0 % (ref 0.0–0.2)

## 2020-07-08 LAB — HEMOGLOBIN AND HEMATOCRIT, BLOOD
HCT: 32.6 % — ABNORMAL LOW (ref 39.0–52.0)
HCT: 33.6 % — ABNORMAL LOW (ref 39.0–52.0)
Hemoglobin: 10.7 g/dL — ABNORMAL LOW (ref 13.0–17.0)
Hemoglobin: 11 g/dL — ABNORMAL LOW (ref 13.0–17.0)

## 2020-07-08 LAB — OCCULT BLOOD X 1 CARD TO LAB, STOOL: Fecal Occult Bld: POSITIVE — AB

## 2020-07-08 LAB — C DIFFICILE QUICK SCREEN W PCR REFLEX
C Diff antigen: NEGATIVE
C Diff interpretation: NOT DETECTED
C Diff toxin: NEGATIVE

## 2020-07-08 NOTE — Progress Notes (Addendum)
PROGRESS NOTE    Jonathan Wilson  [id number] DOB: 1951/02/17 DOA: 07/06/2020 PCP: Vicenta Aly, FNP   Chief Complaint  Patient presents with  . Diarrhea    Brief Narrative: 70 year old male with hypertension, chronic kidney disease stage II CKDI, baseline  Creat 1.06 Jan 2019, on 120 mg methadone for chronic opiate use started 6 weeks ago admitted with multiple episodes of diarrhea 07/06/2020, and he also c.o some chest discomfort which has been constant with no shortness of breath.  In the ED labs showed acute renal failure, leukocytosis,EKG shows normal sinus rhythm.  High sensitive troponin was 22 and 19, blood pressure was in the low normal with lactic acid of 5.9.  CT scan shows diarrheal state.  Covid test negative.  Patient was given fluid bolus for sepsis protocol started on empiric antibiotics and admitted for further management. Patient had urine retention in the ED and was also having renal failure, Foley was placed. He had lactic acidosis level has been trending up and down and was aggressively hydrated  Subjective: Feels well, no more BM since coming to hospital no c diff not done No nausea or vomiting Overnight lactic acidosis upto 3.7-resolved to 0.9 Blood pressure stable, afebrile Creatinine much better 1.8 leukocytosis resolved  Addendum nursing reports that patient had a bloody bowel movement times 1 in the afternoon  Assessment & Plan:  AKI on CKD IIIa: Baseline creatinine ~1.5 in 2020. suspect multifactorial from severe dehydration/diarrhea and also urine retention.Voiding well with foley in place. Renal US normal. Will cut down ivf, d/c foley if repeat bmp show creat improving. Recent Labs    07/06/20 1814 07/07/20 0251 07/07/20 0621 07/08/20 0438  BUN 39*  --  43* 34*  CREATININE 2.62* 2.80* 2.72* 1.81*    Intake/Output Summary (Last 24 hours) at 07/08/2020 0853 Last data filed at 07/08/2020 0640 Gross per 24 hour  Intake 5152.27 ml  Output 2050 ml  Net  3102.27 ml   Diarrhea multiple episodes watery, no BM since coming to ED- pending C. difficile and GI panel.  Continue on enteric precaution.Continue supportive measures with IV fluid hydration, diet as tolerated. On empiric Cipro/Flagyl for now.  Bright red bleeding per rectum patient had episodes of BM with blood,C. difficile and GI panel sent I will also consult Dr. Cristina Gong and informed him from [facility], will stop heparin subcu and trend H&H. It appears he had last colonoscopy in 2017 at [facility]  Volume depletion/dehydration,orthostatic hypotension:BP stable, cut ivf, ambulate w/ RN.  SIRS w/ leukocytosis/lactic acidosis-blood cx pending, procalcitonin was stable 0.13, UA without evidence of UTI.  Labs have improved.  Continue to monitor lactic acid level given patient's hypotension Recent Labs  Lab 07/06/20 1824 07/06/20 2115 07/07/20 0251 07/07/20 0621 07/07/20 1148 07/07/20 1342 07/07/20 1700 07/07/20 2114 07/08/20 0438  WBC 15.5*  --  10.3 8.1  --   --   --   --  6.0  LATICACIDVEN 5.4*   < > 1.2 0.6 2.1* 1.1 2.1* 3.7* 0.9  PROCALCITON  --   --  0.13  --   --   --   --   --   --    < > = values in this interval not displayed.   Lactic acidosis 5.4,secondary to volume loss hypovolemia, level has been trending up and down overnight went up to 3.7 and has resolved to 0.9.   Chest Pain, high sensitivity trops 22>19, subtle increase downtrending ,TTE pending.  This morning has no chest  pain.  ACS rule out.  Monitor.  Hyperkalemia with [id number].  Acute pulmonary insufficiency SPO2 88% needing 3 to nasal cannula-wean off o2 COPD: on 2l Vivian spo2 at 99- wean o2, not wheezing. Cont home inhalers  Essential hypertension: Holding home meds due to hypotension on admission. Blood pressure stable.  Hx of Gout on allopurinol  Chronic methadone,use for history of opiate abuse as per the patient. Back on his home methadone confirmed by pharmacy.  History of renal cell cancer  and history of right partial nephrectomy.  History hepatitis C treated at [facility]  Infrarenal aortic aneurysm unchanged at 3.4 cm. Will need to advise follow-up every 3 years per recommendation   Overweight with Body mass index is 29.37 kg/m.  Nutrition: Diet Order            Diet Heart Room service appropriate? Yes; Fluid consistency: Thin  Diet effective now                DVT prophylaxis: heparin injection 5,000 Units Start: 07/06/20 2330 Code Status:   Code Status: Full Code  Family Communication: plan of care discussed with patient at bedside.  Status is: Inpatient Patient remains inpatient for ongoing management of renal failure, volume depletion.  Dispo: The patient is from: Home              Anticipated d/c is to: Home              Anticipated d/c date is:1 day              Patient currently is not medically stable to d/c.   Difficult to place patient No  Consultants:see note  Procedures:see note  Culture/Microbiology    Component Value Date/Time   SDES  07/06/2020 2115    BLOOD RIGHT WRIST Performed at [facility] [address] [place], [place] [address]    SPECREQUEST  07/06/2020 2115    BOTTLES DRAWN AEROBIC AND ANAEROBIC Blood Culture adequate volume Performed at [facility] [address], [place], [place] [address]    CULT  07/06/2020 2115    NO GROWTH < 12 HOURS Performed at [facility] [address], [place], [place] [address]    REPTSTATUS PENDING 07/06/2020 2115    Other culture-see note  Medications: Scheduled Meds: . allopurinol  300 mg Oral Daily  . Chlorhexidine Gluconate Cloth  6 each Topical Daily  . heparin  5,000 Units Subcutaneous Q8H  . methadone  120 mg Oral Daily  . PARoxetine  40 mg Oral Daily   Continuous Infusions: . ciprofloxacin 400 mg (07/08/20 0536)  . lactated ringers 125 mL/hr at 07/07/20 2031  . metronidazole 500 mg (07/08/20 0501)     Antimicrobials: Anti-infectives (From admission, onward)   Start     Dose/Rate Route Frequency Ordered Stop   07/07/20 0600  metroNIDAZOLE (FLAGYL) IVPB 500 mg        500 mg 100 mL/hr over 60 Minutes Intravenous Every 8 hours 07/07/20 0546     07/07/20 0600  ciprofloxacin (CIPRO) IVPB 400 mg        400 mg 200 mL/hr over 60 Minutes Intravenous Every 12 hours 07/07/20 0549     07/06/20 1830  ceFEPIme (MAXIPIME) 2 g in sodium chloride 0.9 % 100 mL IVPB        2 g 200 mL/hr over 30 Minutes Intravenous  Once 07/06/20 1825 07/06/20 1919   07/06/20 1830  metroNIDAZOLE (FLAGYL) IVPB 500 mg  500 mg 100 mL/hr over 60 Minutes Intravenous  Once 07/06/20 1825 07/06/20 2052     Objective: Vitals: Today's Vitals   07/08/20 0148 07/08/20 0358 07/08/20 0531 07/08/20 0813  BP: 111/66 117/74 (!) 139/99 138/81  Pulse: 64 64 61 66  Resp:    19  Temp: 98.2 F (36.8 C)   98.4 F (36.9 C)  TempSrc: Oral     SpO2: 95% 94% 94% 94%  Weight:      Height:      PainSc:        Intake/Output Summary (Last 24 hours) at 07/08/2020 0851 Last data filed at 07/08/2020 0640 Gross per 24 hour  Intake 5152.27 ml  Output 2050 ml  Net 3102.27 ml   Filed Weights   07/06/20 2129  Weight: 106.6 kg   Weight change:   Intake/Output from previous day: 02/04 0701 - 02/05 0700 In: 5396.9 [P.O.:500; I.V.:2000; IV Piggyback:2896.9] Out: 2050 [Urine:2050] Intake/Output this shift: No intake/output data recorded. Filed Weights   07/06/20 2129  Weight: 106.6 kg    Examination: General exam: AAOx3 , NAD, weak appearing. HEENT:Oral mucosa moist, Ear/Nose WNL grossly, dentition normal. Respiratory system: bilaterally clear,no wheezing or crackles,no use of accessory muscle Cardiovascular system: S1 & S2 +, No JVD,. Gastrointestinal system: Abdomen soft, NT,ND, BS+ Nervous System:Alert, awake, moving extremities and grossly nonfocal Extremities: No edema, distal peripheral pulses palpable.  Skin: No  rashes,no icterus. MSK: Normal muscle bulk,tone, power  Data Reviewed: I have personally reviewed following labs and imaging studies CBC: Recent Labs  Lab 07/06/20 1824 07/07/20 0251 07/07/20 0621 07/08/20 0438  WBC 15.5* 10.3 8.1 6.0  NEUTROABS 12.4*  --   --   --   HGB 17.7* 13.8 12.3* 10.7*  HCT 53.8* 42.1 36.9* 33.6*  MCV 97.5 97.5 97.4 101.2*  PLT 317 216 164 [id number]*   Basic Metabolic Panel: Recent Labs  Lab 07/06/20 1814 07/07/20 0251 07/07/20 0621 07/08/20 0438  NA 142  --  137 134*  K 5.7*  --  4.7 4.7  CL 97*  --  100 103  CO2 29  --  27 26  GLUCOSE 136*  --  87 70  BUN 39*  --  43* 34*  CREATININE 2.62* 2.80* 2.72* 1.81*  CALCIUM 10.6*  --  8.8* 8.5*   GFR: Estimated Creatinine Clearance: 51.5 mL/min (A) (by C-G formula based on SCr of 1.81 mg/dL (H)). Liver Function Tests: Recent Labs  Lab 07/06/20 1814 07/07/20 0621  AST 31 18  ALT 23 17  ALKPHOS 84 52  BILITOT 1.0 0.5  PROT 8.3* 5.9*  ALBUMIN 4.8 3.4*   Recent Labs  Lab 07/06/20 1814  LIPASE 40   No results for input(s): AMMONIA in the last 168 hours. Coagulation Profile: Recent Labs  Lab 07/06/20 1824  INR 1.0   Cardiac Enzymes: No results for input(s): CKTOTAL, CKMB, CKMBINDEX, TROPONINI in the last 168 hours. BNP (last 3 results) No results for input(s): PROBNP in the last 8760 hours. HbA1C: No results for input(s): HGBA1C in the last 72 hours. CBG: No results for input(s): GLUCAP in the last 168 hours. Lipid Profile: No results for input(s): CHOL, HDL, LDLCALC, TRIG, CHOLHDL, LDLDIRECT in the last 72 hours. Thyroid Function Tests: No results for input(s): TSH, T4TOTAL, FREET4, T3FREE, THYROIDAB in the last 72 hours. Anemia Panel: No results for input(s): VITAMINB12, FOLATE, FERRITIN, TIBC, IRON, RETICCTPCT in the last 72 hours. Sepsis Labs: Recent Labs  Lab 07/07/20 0251 07/07/20 [id number] 07/07/20 1342 07/07/20  1700 07/07/20 2114 07/08/20 0438  PROCALCITON 0.13  --   --   --    --   --   LATICACIDVEN 1.2   < > 1.1 2.1* 3.7* 0.9   < > = values in this interval not displayed.    Recent Results (from the past 240 hour(s))  Blood Culture (routine x 2)     Status: None (Preliminary result)   Collection Time: 07/06/20  6:24 PM   Specimen: BLOOD  Result Value Ref Range Status   Specimen Description   Final    BLOOD RIGHT ANTECUBITAL Performed at [facility] [address] [place], [place] [address]    Special Requests   Final    BOTTLES DRAWN AEROBIC AND ANAEROBIC Blood Culture adequate volume Performed at [facility] [address] [place], [place] [address]    Culture   Final    NO GROWTH < 12 HOURS Performed at [facility] [address], [place], [place] [address]    Report Status PENDING  Incomplete  SARS Coronavirus 2 by RT PCR (hospital order, performed in [facility] hospital lab) Nasopharyngeal Nasopharyngeal Swab     Status: None   Collection Time: 07/06/20  6:49 PM   Specimen: Nasopharyngeal Swab  Result Value Ref Range Status   SARS Coronavirus 2 NEGATIVE NEGATIVE Final    Comment: (NOTE) SARS-CoV-2 target nucleic acids are NOT DETECTED.  The SARS-CoV-2 RNA is generally detectable in upper and lower respiratory specimens during the acute phase of infection. The lowest concentration of SARS-CoV-2 viral copies this assay can detect is 250 copies / mL. A negative result does not preclude SARS-CoV-2 infection and should not be used as the sole basis for treatment or other patient management decisions.  A negative result may occur with improper specimen collection / handling, submission of specimen other than nasopharyngeal swab, presence of viral mutation(s) within the areas targeted by this assay, and inadequate number of viral copies (<250 copies / mL). A negative result must be combined with clinical observations, patient history, and epidemiological information.  Fact Sheet for Patients:    StrictlyIdeas.no  Fact Sheet for Healthcare Providers: BankingDealers.co.za  This test is not yet approved or  cleared by the [place] FDA and has been authorized for detection and/or diagnosis of SARS-CoV-2 by FDA under an Emergency Use Authorization (EUA).  This EUA will remain in effect (meaning this test can be used) for the duration of the COVID-19 declaration under Section 564(b)(1) of the Act, 21 U.S.C. section 360bbb-3(b)(1), unless the authorization is terminated or revoked sooner.  Performed at [facility], [place] [address], [place], [place] [address]   Blood Culture (routine x 2)     Status: None (Preliminary result)   Collection Time: 07/06/20  9:15 PM   Specimen: BLOOD  Result Value Ref Range Status   Specimen Description   Final    BLOOD RIGHT WRIST Performed at [facility] [address] [place], [place] [address]    Special Requests   Final    BOTTLES DRAWN AEROBIC AND ANAEROBIC Blood Culture adequate volume Performed at [facility] [address] [place], [place] [address]    Culture   Final    NO GROWTH < 12 HOURS Performed at [facility] [address], [place], [place] [address]    Report Status PENDING  Incomplete     Radiology Studies: CT ABDOMEN PELVIS WO CONTRAST  Result Date: 07/06/2020 CLINICAL DATA:  Acute abdominal pain. Nausea, diarrhea, and weakness. EXAM: CT ABDOMEN AND PELVIS WITHOUT CONTRAST TECHNIQUE: Multidetector CT imaging of the abdomen and pelvis was performed following the standard protocol without IV contrast. COMPARISON:  CT 05/21/2019 FINDINGS: Lower chest: Minor basilar atelectasis. No confluent airspace disease. Coronary artery calcifications. Hepatobiliary: No focal hepatic abnormality. Cholecystectomy without biliary dilatation. Pancreas: Parenchymal atrophy. No ductal dilatation or inflammation. Spleen: Normal  in size without focal abnormality. Adrenals/Urinary Tract: Normal adrenal glands. Postsurgical change in the upper right kidney post partial nephrectomy. No evidence of recurrent mass. No hydronephrosis. No perinephric edema. No renal calculi. Urinary bladder is minimally distended. Wall thickening is likely related to degree of distension. Stomach/Bowel: Fluid distended stomach. 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 Few fluid-filled pelvic bowel loops without abnormal distension or inflammation. Appendix is normal. Liquid stool in the cecum and ascending colon, also distal descending and sigmoid colon. Transverse colon is decompressed which limits assessment for wall thickening. No other colonic wall thickening is seen. Occasional sigmoid diverticula without diverticulitis. Vascular/Lymphatic: Calcified aortic atherosclerosis. Questionable vessel thickening of the infrarenal aorta, stable. Infrarenal aortic aneurysm at 3.4 cm. This is unchanged from prior. Moderate branch atherosclerosis. No enlarged lymph nodes in the abdomen or pelvis. Small paraesophageal nodes adjacent to the distal esophagus unchanged. Reproductive: Prostate is unremarkable. Other: No ascites or free air. Small fat containing umbilical hernia. Musculoskeletal: There are no acute or suspicious osseous abnormalities. Degenerative disc disease at L5-S1. IMPRESSION: 1. Liquid stool in the colon, can be seen with diarrheal illness. No bowel obstruction or inflammation. 2. Post right partial nephrectomy without evidence of recurrent mass. 3. Unchanged infrarenal aortic aneurysm at 3.4 cm. Recommend follow-up every 3 years. This recommendation follows ACR consensus guidelines: White Paper of the ACR Incidental Findings Committee II on Vascular Findings. J Am Coll Radiol 2013; 10:789-794. Aortic Atherosclerosis (ICD10-I70.0). Electronically Signed   By: Keith Rake M.D.   On: 07/06/2020 21:56   DG  Chest 1 View  Result Date: 07/07/2020 CLINICAL DATA:  Nausea and weakness EXAM: CHEST  1 VIEW COMPARISON:  07/06/2020 FINDINGS: Mild lingular scarring. No focal consolidation. No pleural effusion or pneumothorax. Heart and mediastinal contours are unremarkable. No acute osseous abnormality. IMPRESSION: No active disease. Electronically Signed   By: Kathreen Devoid   On: 07/07/2020 18:41   US RENAL  Result Date: 07/07/2020 CLINICAL DATA:  Acute renal insufficiency, remote partial right nephrectomy EXAM: RENAL / URINARY TRACT ULTRASOUND COMPLETE COMPARISON:  None. FINDINGS: Right Kidney: Renal measurements: 9.3 x 4.9 x 4.8 cm = volume: 113 mL. Echogenicity within normal limits. No mass or hydronephrosis visualized. There is mild focal cortical thinning within the mid to upper pole of the right kidney possibly reflecting the sequela of surgical resection. Left Kidney: Renal measurements: 10.3 x 5.3 x 4.7 cm = volume: 133 mL. Echogenicity within normal limits. No mass or hydronephrosis visualized. Bladder: Not well visualized, possibly due to decompression. Other: None. IMPRESSION: No acute abnormality. Electronically Signed   By: Fidela Salisbury MD   On: 07/07/2020 12:23   DG Chest Port 1 View  Result Date: 07/06/2020 CLINICAL DATA:  70 year old male with concern for sepsis. EXAM: PORTABLE CHEST 1 VIEW COMPARISON:  Chest radiograph dated 05/20/2019. FINDINGS: No focal consolidation, pleural effusion or pneumothorax. The cardiac silhouette is within limits. Atherosclerotic calcification of the aorta. No acute osseous pathology. IMPRESSION: No active disease. Electronically Signed   By: Laren Everts.D.  On: 07/06/2020 18:53   ECHOCARDIOGRAM COMPLETE  Result Date: 07/07/2020    ECHOCARDIOGRAM REPORT   Patient Name:   JAMEIL WHITMOYER Malatesta Date of Exam: 07/07/2020 Medical Rec #:  [id number]     Height:       75.0 in Accession #:    [id number]    Weight:       235.0 lb Date of Birth:  08/19/1950     BSA:          2.350 m  Patient Age:    53 years      BP:           101/63 mmHg Patient Gender: M             HR:           58 bpm. Exam Location:  Inpatient Procedure: 2D Echo, Cardiac Doppler and Color Doppler Indications:    R07.9* Chest pain, unspecified  History:        Patient has prior history of Echocardiogram examinations, most                 recent 09/18/2015. COPD, Signs/Symptoms:Syncope; Risk                 Factors:Hypertension, Current Smoker and Dyslipidemia.                 Psychosis. Opiate use. Methadone.  Sonographer:    Roseanna Rainbow RDCS Referring Phys: [place]  Sonographer Comments: Technically difficult study due to poor echo windows. IMPRESSIONS  1. Left ventricular ejection fraction, by estimation, is 60 to 65%. The left ventricle has normal function. The left ventricle has no regional wall motion abnormalities. There is mild left ventricular hypertrophy. Left ventricular diastolic parameters are consistent with Grade I diastolic dysfunction (impaired relaxation).  2. Right ventricular systolic function is normal. The right ventricular size is mildly enlarged.  3. Right atrial size was mildly dilated.  4. The mitral valve is normal in structure. No evidence of mitral valve regurgitation. No evidence of mitral stenosis.  5. The aortic valve is grossly normal. Aortic valve regurgitation is not visualized. Mild aortic valve sclerosis is present, with no evidence of aortic valve stenosis.  6. The inferior vena cava is normal in size with greater than 50% respiratory variability, suggesting right atrial pressure of 3 mmHg. FINDINGS  Left Ventricle: Left ventricular ejection fraction, by estimation, is 60 to 65%. The left ventricle has normal function. The left ventricle has no regional wall motion abnormalities. The left ventricular internal cavity size was normal in size. There is  mild left ventricular hypertrophy. Left ventricular diastolic parameters are consistent with Grade I diastolic dysfunction  (impaired relaxation). Right Ventricle: The right ventricular size is mildly enlarged. No increase in right ventricular wall thickness. Right ventricular systolic function is normal. Left Atrium: Left atrial size was normal in size. Right Atrium: Right atrial size was mildly dilated. Pericardium: There is no evidence of pericardial effusion. Mitral Valve: The mitral valve is normal in structure. Mild mitral annular calcification. No evidence of mitral valve regurgitation. No evidence of mitral valve stenosis. Tricuspid Valve: The tricuspid valve is normal in structure. Tricuspid valve regurgitation is not demonstrated. No evidence of tricuspid stenosis. Aortic Valve: The aortic valve is grossly normal. Aortic valve regurgitation is not visualized. Mild aortic valve sclerosis is present, with no evidence of aortic valve stenosis. Pulmonic Valve: The pulmonic valve was normal in structure. Pulmonic valve regurgitation is not visualized. No evidence  of pulmonic stenosis. Aorta: The aortic root is normal in size and structure. Venous: The inferior vena cava is normal in size with greater than 50% respiratory variability, suggesting right atrial pressure of 3 mmHg. IAS/Shunts: No atrial level shunt detected by color flow Doppler.  LEFT VENTRICLE PLAX 2D LVIDd:         4.30 cm      Diastology LVIDs:         3.10 cm      LV e' medial:    11.60 cm/s LV PW:         1.20 cm      LV E/e' medial:  6.3 LV IVS:        1.30 cm      LV e' lateral:   13.60 cm/s LVOT diam:     2.30 cm      LV E/e' lateral: 5.4 LV SV:         101 LV SV Index:   43 LVOT Area:     4.15 cm  LV Volumes (MOD) LV vol d, MOD A2C: 83.3 ml LV vol d, MOD A4C: 102.0 ml LV vol s, MOD A2C: 28.2 ml LV vol s, MOD A4C: 31.1 ml LV SV MOD A2C:     55.2 ml LV SV MOD A4C:     102.0 ml LV SV MOD BP:      66.1 ml RIGHT VENTRICLE             IVC RV S prime:     11.40 cm/s  IVC diam: 2.10 cm TAPSE (M-mode): 2.4 cm LEFT ATRIUM             Index       RIGHT ATRIUM            Index LA diam:        3.60 cm 1.53 cm/m  RA Area:     20.50 cm LA Vol (A2C):   40.9 ml 17.40 ml/m RA Volume:   67.00 ml  28.51 ml/m LA Vol (A4C):   52.5 ml 22.34 ml/m LA Biplane Vol: 49.7 ml 21.15 ml/m  AORTIC VALVE LVOT Vmax:   114.00 cm/s LVOT Vmean:  73.000 cm/s LVOT VTI:    0.243 m  AORTA Ao Root diam: 3.80 cm Ao Asc diam:  3.50 cm MITRAL VALVE MV Area (PHT): 3.85 cm     SHUNTS MV Decel Time: 197 msec     Systemic VTI:  0.24 m MV E velocity: 73.10 cm/s   Systemic Diam: 2.30 cm MV A velocity: 109.00 cm/s MV E/A ratio:  0.67 Candee Furbish MD Electronically signed by Candee Furbish MD Signature Date/Time: 07/07/2020/3:45:26 PM    Final      LOS: 1 day   Antonieta Pert, MD [facility]  07/08/2020, 8:51 AM

## 2020-07-08 NOTE — Progress Notes (Signed)
CRITICAL VALUE ALERT  Critical Value: Lactic Acid 3.7  Date & Time Notied:  07/07/20;2115  Provider Notified: YES  Orders Received/Actions taken: New Orders

## 2020-07-08 NOTE — Plan of Care (Signed)
Lactic Acid Much improved

## 2020-07-08 NOTE — Progress Notes (Signed)
Pt foley removed at 1430, pt due to void by 1830.

## 2020-07-08 NOTE — Consult Note (Signed)
Referring Provider: [facility] Primary Care Physician:  Vicenta Aly, [place] Primary Gastroenterologist: None (unassigned)  Reason for Consultation: Rectal bleeding  HPI: Jonathan Wilson is a 70 y.o. male admitted to the hospital 2 days ago with severe diarrhea, hypotension, and lactic acidosis.  Those symptoms have improved with hydration and antibiotics.  This afternoon, he had a single bloody bowel movement, apparently mushy in character, without abdominal pain.  The patient describes it as the blood being "at the end" of the stool.    He has perianal soreness, but no abdominal pain.  The patient's admission hemoglobin of 17.7 was only slightly higher than his baseline of 17.1 from a year and a half earlier, perhaps as a reflection of the patient's underlying COPD.  Since admission, with hydration, there has been a progressive decline in his hemoglobin level, to a level of 10.7 today.  CT on admission did not show any evidence of ischemic colitis but does show sigmoid diverticulosis.  Care Everywhere indicates that he he had colonoscopy through [facility] care in December 2004 but the report and results are not available.  The patient does not have exposure to ulcerogenic medications.  There is an underlying history of advanced liver disease, followed by [facility] at the [facility] liver clinic here in [place], characterized by F3/4 fibrosis on an ultrasound in Sep 28, 2016, after which he was treated for hepatitis C with a sustained virologic response.  He has moderate ongoing ethanol consumption.  He missed his September 2021 follow-up appointment.  He has had no further episodes of bleeding since his episode several hours ago.      Past Medical History:  Diagnosis Date  . Anxiety   . Anxiety and depression 1998-09-29   worse after death of wife 2013-03-30  . Depression   . Diverticulosis of colon Sep 28, 2009   seen on CT scan in September 28, 2009.   [name] Gout   . Hepatitis C before Sep 29, 1998  .  Hyperlipidemia   . Hypertension   . Influenza A 06/2013   acute resp failure, did not require intubation.  . Obesity    300 # in 2008/09/28, BMI 34 in 04/2013.   [name] Pneumonia    recurrent episodes   . Psychosis ([place]) 09-29-98   s/e of treatment for Hepatitis C.    . Renal cancer ([place])   . Right lower lobe lung mass 05/2008   resolved on follow up CT 09-29-10.   . Vitamin D deficiency     Past Surgical History:  Procedure Laterality Date  . CHOLECYSTECTOMY N/A 01/14/2014   Procedure: LAPAROSCOPIC CHOLECYSTECTOMY WITH INTRAOPERATIVE CHOLANGIOGRAM;  Surgeon: Shann Medal, MD;  Location: [facility];  Service: General;  Laterality: N/A;  . MANDIBLE FRACTURE SURGERY    . ROBOTIC ASSITED PARTIAL NEPHRECTOMY Right 08/07/2015   Procedure: XI ROBOTIC ASSISTED RIGHT PARTIAL NEPHRECTOMY;  Surgeon: Cleon Gustin, MD;  Location: [facility];  Service: Urology;  Laterality: Right;    Prior to Admission medications   Medication Sig Start Date End Date Taking? Authorizing Provider  allopurinol (ZYLOPRIM) 100 MG tablet Take 300 mg by mouth daily.   Yes [provider]  lisinopril-hydrochlorothiazide (PRINZIDE,ZESTORETIC) 20-12.5 MG tablet Take 2 tablets by mouth daily. 11/24/16  Yes Thurnell Lose, MD  methadone (DOLOPHINE) 10 MG/5ML solution Take 120 mg by mouth daily.   Yes [provider]  Multiple Vitamin (MULTIVITAMIN WITH MINERALS) TABS tablet Take 1 tablet by mouth daily.   Yes [provider]  PARoxetine (PAXIL)  40 MG tablet Take 40 mg by mouth daily.   Yes [provider]    Current Facility-Administered Medications  Medication Dose Route Frequency Provider Last Rate Last Admin  . acetaminophen (TYLENOL) tablet 650 mg  650 mg Oral Q6H PRN Rise Patience, MD       Or  . acetaminophen (TYLENOL) suppository 650 mg  650 mg Rectal Q6H PRN Rise Patience, MD      . allopurinol (ZYLOPRIM) tablet 300 mg  300 mg Oral Daily Rise Patience, MD   300 mg at  07/08/20 1031  . Chlorhexidine Gluconate Cloth 2 % PADS 6 each  6 each Topical Daily Kc, Ramesh, MD      . ciprofloxacin (CIPRO) IVPB 400 mg  400 mg Intravenous Q12H Poindexter, Leann T, RPH 200 mL/hr at 07/08/20 0536 400 mg at 07/08/20 0536  . hydrALAZINE (APRESOLINE) injection 10 mg  10 mg Intravenous Q4H PRN Rise Patience, MD      . lactated ringers infusion   Intravenous Continuous Antonieta Pert, MD 125 mL/hr at 07/08/20 1221 Rate Change at 07/08/20 1221  . methadone (DOLOPHINE) 10 MG/ML solution 120 mg  120 mg Oral Daily Kc, Ramesh, MD   120 mg at 07/08/20 1032  . metroNIDAZOLE (FLAGYL) IVPB 500 mg  500 mg Intravenous Q8H Rise Patience, MD 100 mL/hr at 07/08/20 0501 500 mg at 07/08/20 0501  . PARoxetine (PAXIL) tablet 40 mg  40 mg Oral Daily Rise Patience, MD   40 mg at 07/08/20 1031    Allergies as of 07/06/2020  . (No Known Allergies)    Family History  Problem Relation Age of Onset  . Diabetes Mother   . Arthritis Mother   . Cancer Father        pancreatic cancer    Social History   Socioeconomic History  . Marital status: Legally Separated    Spouse name: Not on file  . Number of children: 3  . Years of education: Not on file  . Highest education level: Not on file  Occupational History  . Occupation: Employed: Architect work    Fish farm manager: UNEMPLOYED  Tobacco Use  . Smoking status: Former Smoker    Packs/day: 0.25    Years: 40.00    Pack years: 10.00    Types: Cigarettes  . Smokeless tobacco: Never Used  Substance and Sexual Activity  . Alcohol use: No    Alcohol/week: 1.0 standard drink    Types: 1 Cans of beer per week    Comment: hx of etoh abuse- 12 years ago   . Drug use: No    Types: Heroin    Comment: last use 12 years ago   . Sexual activity: Never  Other Topics Concern  . Not on file  Social History Narrative   Widowed.  As of 01/2014 is living in appt with his brother-in-law.  Independent of ADLs.   Social Determinants of  Health   Financial Resource Strain: Not on file  Food Insecurity: Not on file  Transportation Needs: Not on file  Physical Activity: Not on file  Stress: Not on file  Social Connections: Not on file  Intimate Partner Violence: Not on file     Physical Exam: Vital signs in last 24 hours: Temp:  [98 F (36.7 C)-99.7 F (37.6 C)] 98.2 F (36.8 C) (02/05 0942) Pulse Rate:  [52-87] 62 (02/05 0942) Resp:  [12-20] 17 (02/05 0942) BP: (95-139)/(56-99) 130/66 (02/05 0942) SpO2:  [94 %-100 %]  97 % (02/05 0942) Last BM Date: 07/07/20 General:   Alert,  Well-developed, well-nourished, pleasant and cooperative in NAD Head:  Normocephalic and atraumatic. Eyes:  Sclera clear, no icterus.    Lungs: A few soft rhonchi are heard anteriorly, no respiratory distress Heart:   Heart sounds are too distant to appreciate Abdomen:  Soft, nontender, nontympanitic, and nondistended. No masses, hepatosplenomegaly or ventral hernias noted.  At this time, I do not hear bowel sounds.  However, overall, this is a very benign abdominal exam. Rectal: No prolapsed hemorrhoids or evident perianal disease, but on digital exam, it feels like there are bulbous hemorrhoids within the anal canal.  Rectal ampulla is empty, no masses, light tan mucoid residue, no visible blood. Msk:   Symmetrical without gross deformities. Pulses:  Normal radial pulse is noted. Extremities:   Without clubbing, cyanosis, or edema. Neurologic:  Alert and coherent;  grossly normal neurologically. Skin:  Intact without significant lesions or rashes. Psych:   Alert and cooperative. Normal mood and affect.  Intake/Output from previous day: 02/04 0701 - 02/05 0700 In: 5396.9 [P.O.:500; I.V.:2000; IV Piggyback:2896.9] Out: 2050 [Urine:2050] Intake/Output this shift: Total I/O In: -  Out: 1600 [Urine:1600]  Lab Results: Recent Labs    07/07/20 0251 07/07/20 0621 07/08/20 0438  WBC 10.3 8.1 6.0  HGB 13.8 12.3* 10.7*  HCT 42.1 36.9*  33.6*  PLT 216 164 135*   BMET Recent Labs    07/06/20 1814 07/07/20 0251 07/07/20 0621 07/08/20 0438  NA 142  --  137 134*  K 5.7*  --  4.7 4.7  CL 97*  --  100 103  CO2 29  --  27 26  GLUCOSE 136*  --  87 70  BUN 39*  --  43* 34*  CREATININE 2.62* 2.80* 2.72* 1.81*  CALCIUM 10.6*  --  8.8* 8.5*   LFT Recent Labs    07/07/20 0621  PROT 5.9*  ALBUMIN 3.4*  AST 18  ALT 17  ALKPHOS 52  BILITOT 0.5   PT/INR Recent Labs    07/06/20 1824  LABPROT 13.2  INR 1.0    Studies/Results: CT ABDOMEN PELVIS WO CONTRAST  Result Date: 07/06/2020 CLINICAL DATA:  Acute abdominal pain. Nausea, diarrhea, and weakness. EXAM: CT ABDOMEN AND PELVIS WITHOUT CONTRAST TECHNIQUE: Multidetector CT imaging of the abdomen and pelvis was performed following the standard protocol without IV contrast. COMPARISON:  CT 05/21/2019 FINDINGS: Lower chest: Minor basilar atelectasis. No confluent airspace disease. Coronary artery calcifications. Hepatobiliary: No focal hepatic abnormality. Cholecystectomy without biliary dilatation. Pancreas: Parenchymal atrophy. No ductal dilatation or inflammation. Spleen: Normal in size without focal abnormality. Adrenals/Urinary Tract: Normal adrenal glands. Postsurgical change in the upper right kidney post partial nephrectomy. No evidence of recurrent mass. No hydronephrosis. No perinephric edema. No renal calculi. Urinary bladder is minimally distended. Wall thickening is likely related to degree of distension. Stomach/Bowel: Fluid distended stomach. 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 Few fluid-filled pelvic bowel loops without abnormal distension or inflammation. Appendix is normal. Liquid stool in the cecum and ascending colon, also distal descending and sigmoid colon. Transverse colon is decompressed which limits assessment for wall thickening. No other colonic wall thickening is seen. Occasional sigmoid diverticula without  diverticulitis. Vascular/Lymphatic: Calcified aortic atherosclerosis. Questionable vessel thickening of the infrarenal aorta, stable. Infrarenal aortic aneurysm at 3.4 cm. This is unchanged from prior. Moderate branch atherosclerosis. No enlarged lymph nodes in the abdomen or pelvis. Small paraesophageal nodes adjacent to the distal  esophagus unchanged. Reproductive: Prostate is unremarkable. Other: No ascites or free air. Small fat containing umbilical hernia. Musculoskeletal: There are no acute or suspicious osseous abnormalities. Degenerative disc disease at L5-S1. IMPRESSION: 1. Liquid stool in the colon, can be seen with diarrheal illness. No bowel obstruction or inflammation. 2. Post right partial nephrectomy without evidence of recurrent mass. 3. Unchanged infrarenal aortic aneurysm at 3.4 cm. Recommend follow-up every 3 years. This recommendation follows ACR consensus guidelines: White Paper of the ACR Incidental Findings Committee II on Vascular Findings. J Am Coll Radiol 2013; 10:789-794. Aortic Atherosclerosis (ICD10-I70.0). Electronically Signed   By: Keith Rake M.D.   On: 07/06/2020 21:56   DG Chest 1 View  Result Date: 07/07/2020 CLINICAL DATA:  Nausea and weakness EXAM: CHEST  1 VIEW COMPARISON:  07/06/2020 FINDINGS: Mild lingular scarring. No focal consolidation. No pleural effusion or pneumothorax. Heart and mediastinal contours are unremarkable. No acute osseous abnormality. IMPRESSION: No active disease. Electronically Signed   By: Kathreen Devoid   On: 07/07/2020 18:41   US RENAL  Result Date: 07/07/2020 CLINICAL DATA:  Acute renal insufficiency, remote partial right nephrectomy EXAM: RENAL / URINARY TRACT ULTRASOUND COMPLETE COMPARISON:  None. FINDINGS: Right Kidney: Renal measurements: 9.3 x 4.9 x 4.8 cm = volume: 113 mL. Echogenicity within normal limits. No mass or hydronephrosis visualized. There is mild focal cortical thinning within the mid to upper pole of the right kidney  possibly reflecting the sequela of surgical resection. Left Kidney: Renal measurements: 10.3 x 5.3 x 4.7 cm = volume: 133 mL. Echogenicity within normal limits. No mass or hydronephrosis visualized. Bladder: Not well visualized, possibly due to decompression. Other: None. IMPRESSION: No acute abnormality. Electronically Signed   By: Fidela Salisbury MD   On: 07/07/2020 12:23   DG Chest Port 1 View  Result Date: 07/06/2020 CLINICAL DATA:  69 year old male with concern for sepsis. EXAM: PORTABLE CHEST 1 VIEW COMPARISON:  Chest radiograph dated 05/20/2019. FINDINGS: No focal consolidation, pleural effusion or pneumothorax. The cardiac silhouette is within limits. Atherosclerotic calcification of the aorta. No acute osseous pathology. IMPRESSION: No active disease. Electronically Signed   By: Anner Crete M.D.   On: 07/06/2020 18:53   ECHOCARDIOGRAM COMPLETE  Result Date: 07/07/2020    ECHOCARDIOGRAM REPORT   Patient Name:   Jonathan Wilson Date of Exam: 07/07/2020 Medical Rec #:  [id number]     Height:       75.0 in Accession #:    [id number]    Weight:       235.0 lb Date of Birth:  [birth year]     BSA:          2.350 m Patient Age:    47 years      BP:           101/63 mmHg Patient Gender: M             HR:           58 bpm. Exam Location:  Inpatient Procedure: 2D Echo, Cardiac Doppler and Color Doppler Indications:    R07.9* Chest pain, unspecified  History:        Patient has prior history of Echocardiogram examinations, most                 recent 09/18/2015. COPD, Signs/Symptoms:Syncope; Risk                 Factors:Hypertension, Current Smoker and Dyslipidemia.  Psychosis. Opiate use. Methadone.  Sonographer:    Roseanna Rainbow RDCS Referring Phys: [place]  Sonographer Comments: Technically difficult study due to poor echo windows. IMPRESSIONS  1. Left ventricular ejection fraction, by estimation, is 60 to 65%. The left ventricle has normal function. The left ventricle has no regional  wall motion abnormalities. There is mild left ventricular hypertrophy. Left ventricular diastolic parameters are consistent with Grade I diastolic dysfunction (impaired relaxation).  2. Right ventricular systolic function is normal. The right ventricular size is mildly enlarged.  3. Right atrial size was mildly dilated.  4. The mitral valve is normal in structure. No evidence of mitral valve regurgitation. No evidence of mitral stenosis.  5. The aortic valve is grossly normal. Aortic valve regurgitation is not visualized. Mild aortic valve sclerosis is present, with no evidence of aortic valve stenosis.  6. The inferior vena cava is normal in size with greater than 50% respiratory variability, suggesting right atrial pressure of 3 mmHg. FINDINGS  Left Ventricle: Left ventricular ejection fraction, by estimation, is 60 to 65%. The left ventricle has normal function. The left ventricle has no regional wall motion abnormalities. The left ventricular internal cavity size was normal in size. There is  mild left ventricular hypertrophy. Left ventricular diastolic parameters are consistent with Grade I diastolic dysfunction (impaired relaxation). Right Ventricle: The right ventricular size is mildly enlarged. No increase in right ventricular wall thickness. Right ventricular systolic function is normal. Left Atrium: Left atrial size was normal in size. Right Atrium: Right atrial size was mildly dilated. Pericardium: There is no evidence of pericardial effusion. Mitral Valve: The mitral valve is normal in structure. Mild mitral annular calcification. No evidence of mitral valve regurgitation. No evidence of mitral valve stenosis. Tricuspid Valve: The tricuspid valve is normal in structure. Tricuspid valve regurgitation is not demonstrated. No evidence of tricuspid stenosis. Aortic Valve: The aortic valve is grossly normal. Aortic valve regurgitation is not visualized. Mild aortic valve sclerosis is present, with no evidence  of aortic valve stenosis. Pulmonic Valve: The pulmonic valve was normal in structure. Pulmonic valve regurgitation is not visualized. No evidence of pulmonic stenosis. Aorta: The aortic root is normal in size and structure. Venous: The inferior vena cava is normal in size with greater than 50% respiratory variability, suggesting right atrial pressure of 3 mmHg. IAS/Shunts: No atrial level shunt detected by color flow Doppler.  LEFT VENTRICLE PLAX 2D LVIDd:         4.30 cm      Diastology LVIDs:         3.10 cm      LV e' medial:    11.60 cm/s LV PW:         1.20 cm      LV E/e' medial:  6.3 LV IVS:        1.30 cm      LV e' lateral:   13.60 cm/s LVOT diam:     2.30 cm      LV E/e' lateral: 5.4 LV SV:         101 LV SV Index:   43 LVOT Area:     4.15 cm  LV Volumes (MOD) LV vol d, MOD A2C: 83.3 ml LV vol d, MOD A4C: 102.0 ml LV vol s, MOD A2C: 28.2 ml LV vol s, MOD A4C: 31.1 ml LV SV MOD A2C:     55.2 ml LV SV MOD A4C:     102.0 ml LV SV MOD BP:  66.1 ml RIGHT VENTRICLE             IVC RV S prime:     11.40 cm/s  IVC diam: 2.10 cm TAPSE (M-mode): 2.4 cm LEFT ATRIUM             Index       RIGHT ATRIUM           Index LA diam:        3.60 cm 1.53 cm/m  RA Area:     20.50 cm LA Vol (A2C):   40.9 ml 17.40 ml/m RA Volume:   67.00 ml  28.51 ml/m LA Vol (A4C):   52.5 ml 22.34 ml/m LA Biplane Vol: 49.7 ml 21.15 ml/m  AORTIC VALVE LVOT Vmax:   114.00 cm/s LVOT Vmean:  73.000 cm/s LVOT VTI:    0.243 m  AORTA Ao Root diam: 3.80 cm Ao Asc diam:  3.50 cm MITRAL VALVE MV Area (PHT): 3.85 cm     SHUNTS MV Decel Time: 197 msec     Systemic VTI:  0.24 m MV E velocity: 73.10 cm/s   Systemic Diam: 2.30 cm MV A velocity: 109.00 cm/s MV E/A ratio:  0.67 Candee Furbish MD Electronically signed by Candee Furbish MD Signature Date/Time: 07/07/2020/3:45:26 PM    Final     Impression:   Probable hemorrhoidal bleeding. 1.  Solitary episode of blood with bowel movement (not necessarily a bloody bowel movement, based on patient's  observation) with perianal pain and recent severe nonbloody diarrhea which certainly could have caused hemorrhoidal irritation.  2.  Progressive decline in hemoglobin, even prior to today's bleeding episode.  The differential diagnosis would include rectal outlet bleeding from irritated hemorrhoids (I favor that diagnosis, given the findings on rectal examination, see above) although ischemic colitis (a possible consequence of his recent volume depletion and hypotension) or self-limited diverticular bleeding are also possibilities.  Of note, both ischemic colitis and diverticular bleeding are typically managed expectantly, as is hemorrhoidal bleeding, so from a management standpoint, distinguishing between those different entities is not essential.  Plan: 1.  Observation.  I will allow the patient a diet but will keep him n.p.o. after midnight in case we need to do sigmoidoscopic evaluation tomorrow.  2.  If he has further episodes of rectal bleeding and or further significant decline in hemoglobin, I would consider unprepped sigmoidoscopic evaluation tomorrow.   LOS: 1 day   [place]  07/08/2020, 12:36 PM   Pager [phone] If no answer or after 5 PM call [phone]

## 2020-07-09 DIAGNOSIS — R197 Diarrhea, unspecified: Secondary | ICD-10-CM

## 2020-07-09 DIAGNOSIS — J42 Unspecified chronic bronchitis: Secondary | ICD-10-CM

## 2020-07-09 LAB — GASTROINTESTINAL PANEL BY PCR, STOOL (REPLACES STOOL CULTURE)

## 2020-07-09 LAB — BASIC METABOLIC PANEL
Anion gap: 8 (ref 5–15)
BUN: 21 mg/dL (ref 8–23)
CO2: 30 mmol/L (ref 22–32)
Calcium: 9.3 mg/dL (ref 8.9–10.3)
Chloride: 103 mmol/L (ref 98–111)
Creatinine, Ser: 1.33 mg/dL — ABNORMAL HIGH (ref 0.61–1.24)
GFR, Estimated: 58 mL/min — ABNORMAL LOW (ref >60)
Glucose, Bld: 86 mg/dL (ref 70–99)
Potassium: 4.9 mmol/L (ref 3.5–5.1)
Sodium: 141 mmol/L (ref 135–145)

## 2020-07-09 LAB — CBC
HCT: 35.3 % — ABNORMAL LOW (ref 39.0–52.0)
Hemoglobin: 11.6 g/dL — ABNORMAL LOW (ref 13.0–17.0)
MCH: 32 pg (ref 26.0–34.0)
MCHC: 32.9 g/dL (ref 30.0–36.0)
MCV: 97.2 fL (ref 80.0–100.0)
Platelets: 148 10*3/uL — ABNORMAL LOW (ref 150–400)
RBC: 3.63 MIL/uL — ABNORMAL LOW (ref 4.22–5.81)
RDW: 12 % (ref 11.5–15.5)
WBC: 4.2 10*3/uL (ref 4.0–10.5)
nRBC: 0 % (ref 0.0–0.2)

## 2020-07-09 LAB — URINE CULTURE: Culture: NO GROWTH

## 2020-07-09 NOTE — Progress Notes (Signed)
The patient has had 2 mushy, nonbloody bowel movements.  His hemoglobin level is actually coming up.  Impression:   1.  Transient hematochezia, differential diagnosis remains the same as yesterday.    2.  Severe diarrhea, now markedly improved although bowel habit is not quite back to baseline.  C. difficile is negative, GI pathogen panel is still pending.  Recommend:  1.  I will sign off.  Please call me if I can be of further assistance in this patient's care.  2.  I did emphasize to the patient the importance of GI follow-up to make sure that he is Hemoccult negative and to consider possible sigmoidoscopic or even colonoscopic evaluation in view of his recent (albeit transient) rectal bleeding.  He is currently followed for primary care by Vicenta Aly, NP at [facility], and he indicates that several years ago, he had a colonoscopy through [facility].  Accordingly, his GI follow-up could be through them and he seems to indicate that this is his preference.  However, I also gave him my card and told him that we could see him in my office if for any reason that was preferable; I will be on standby for that purpose.  3.  I recommended that, once he gets home, he use a an over-the-counter probiotic, such as Align, for 1 month to help repopulate the intestine with healthy bacteria, which might help normalize his bowel function going forward.  Cleotis Nipper, M.D. Pager [phone] If no answer or after 5 PM call [phone]

## 2020-07-09 NOTE — Plan of Care (Signed)

## 2020-07-09 NOTE — Progress Notes (Signed)
PROGRESS NOTE    Jonathan Wilson  [id number] DOB: 1951-04-19 DOA: 07/06/2020 PCP: Vicenta Aly, FNP   Chief Complaint  Patient presents with  . Diarrhea    Brief Narrative: 70 year old male with essential hypertension, chronic kidney disease stage II CKDI, baseline Creat 1.06 Jan 2019, on 120 mg methadone for chronic opiate use started 6 weeks ago, admitted with multiple episodes of diarrhea 07/06/2020. Patient was also noted to have acute kidney injury. Was hospitalized for further management.  Subjective: Patient mentions that his diarrhea appears to be resolving. Stool is more formed. Denies any abdominal pain. Hasn't had any further bleeding episodes. No nausea or vomiting.  Assessment & Plan:  Acute diarrhea with hematochezia Patient had multiple loose stools. Had one episode of blood in stool. Gastroenterology was consulted. Hemoglobin has been stable. No further episodes of rectal bleeding noted. C. difficile test was negative. FOBT was positive. GI pathogen panel is pending. His symptoms appear to be improving. Abdomen remains benign. Do not anticipate any endoscopic evaluation unless he has recurrence of rectal bleeding. Patient empirically on Cipro and Flagyl.  AKI on CKD IIIa/acute urinary retention/hyperkalemia Baseline creatinine ~1.5 in 2020. Suspect AKI is multifactorial from severe hypovolemia and urine retention. This is also in the setting of ACE inhibitor and diuretic use at home. Foley catheter had to be placed. Has been voiding well. Renal ultrasound did not show any acute findings. Foley was removed 2/5.  Start mobilizing.  Renal function has improved. Monitor urine output. Potassium is normal.  SIRS w/ leukocytosis/lactic acidosis Likely due to hypovolemia and hypotension. No evidence for acute infection at this time.   Chest Pain Patient had no significant elevation in troponin. EKG did not show any acute ischemic changes. Echocardiogram shows normal systolic  function. No regional wall motion abnormalities noted. Symptoms have resolved. Do not anticipate any further work-up at this time.  History of COPD Was requiring oxygen. Continue to wean down. Does not use oxygen at home.  Essential hypertension Holding lisinopril/HCTZ. Blood pressure stable.  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 Back on his home methadone confirmed by pharmacy.  History of renal cell cancer Previous history of right partial nephrectomy.  History hepatitis C Treated at [facility]  Infrarenal aortic aneurysm  Unchanged at 3.4 cm. Will need to advise follow-up every 3 years per recommendation   DVT prophylaxis: SCDs Code Status: Full code Family Communication: Discussed with patient Disposition: Hopefully home within 24 to 48 hours.  Status is: Inpatient Patient remains inpatient for ongoing management of renal failure, volume depletion.  Dispo: The patient is from: Home              Anticipated d/c is to: Home              Anticipated d/c date is:1 day              Patient currently is not medically stable to d/c.   Difficult to place patient No  Consultants:see note   Procedures:  Transthoracic echocardiogram IMPRESSIONS   1. Left ventricular ejection fraction, by estimation, is 60 to 65%. The  left ventricle has normal function. The left ventricle has no regional  wall motion abnormalities. There is mild left ventricular hypertrophy.  Left ventricular diastolic parameters  are consistent with Grade I diastolic dysfunction (impaired relaxation).  2. Right ventricular systolic function is normal. The right ventricular  size is mildly enlarged.  3. Right  atrial size was mildly dilated.  4. The mitral valve is normal in structure. No evidence of mitral valve  regurgitation. No evidence of mitral stenosis.  5. The aortic valve is grossly normal. Aortic valve regurgitation is not   visualized. Mild aortic valve sclerosis is present, with no evidence of  aortic valve stenosis.  6. The inferior vena cava is normal in size with greater than 50%  respiratory variability, suggesting right atrial pressure of 3 mmHg.   Culture/Microbiology    Component Value Date/Time   SDES  07/07/2020 2100    IN/OUT CATH URINE Performed at [facility] [address] [place], [place] [address]    SPECREQUEST  07/07/2020 2100    NONE Performed at [facility], [place] [address] [place], [place] [address]    CULT  07/07/2020 2100    NO GROWTH Performed at [facility] [address] [place], [place] [address]    REPTSTATUS 07/09/2020 FINAL 07/07/2020 2100    Other culture-see note  Medications: Scheduled Meds: . allopurinol  300 mg Oral Daily  . Chlorhexidine Gluconate Cloth  6 each Topical Daily  . methadone  120 mg Oral Daily  . PARoxetine  40 mg Oral Daily   Continuous Infusions: . ciprofloxacin 400 mg (07/09/20 0716)  . lactated ringers 50 mL/hr at 07/08/20 1838  . metronidazole 500 mg (07/09/20 0555)    Antimicrobials: Anti-infectives (From admission, onward)   Start     Dose/Rate Route Frequency Ordered Stop   07/07/20 0600  metroNIDAZOLE (FLAGYL) IVPB 500 mg        500 mg 100 mL/hr over 60 Minutes Intravenous Every 8 hours 07/07/20 0546     07/07/20 0600  ciprofloxacin (CIPRO) IVPB 400 mg        400 mg 200 mL/hr over 60 Minutes Intravenous Every 12 hours 07/07/20 0549     07/06/20 1830  ceFEPIme (MAXIPIME) 2 g in sodium chloride 0.9 % 100 mL IVPB        2 g 200 mL/hr over 30 Minutes Intravenous  Once 07/06/20 1825 07/06/20 1919   07/06/20 1830  metroNIDAZOLE (FLAGYL) IVPB 500 mg        500 mg 100 mL/hr over 60 Minutes Intravenous  Once 07/06/20 1825 07/06/20 2052     Objective: Vitals: Today's Vitals   07/08/20 1556 07/08/20 1727 07/08/20 2117 07/08/20 2230  BP: (!) 180/91  (!) 103/91   Pulse: 68  68    Resp: 18  18   Temp: 98.1 F (36.7 C)  98 F (36.7 C)   TempSrc: Oral  Oral   SpO2: 92%  98%   Weight:      Height:      PainSc:  5   0-No pain    Intake/Output Summary (Last 24 hours) at 07/09/2020 1125 Last data filed at 07/09/2020 0500 Gross per 24 hour  Intake 2455.84 ml  Output 750 ml  Net 1705.84 ml   Filed Weights   07/06/20 2129  Weight: 106.6 kg   Weight change:   Intake/Output from previous day: 02/05 0701 - 02/06 0700 In: 2455.8 [I.V.:1994.2; IV Piggyback:461.7] Out: 2350 [Urine:2350] Intake/Output this shift: No intake/output data recorded. Filed Weights   07/06/20 2129  Weight: 106.6 kg    Examination: General appearance: Awake alert.  In no distress Resp: Clear to auscultation bilaterally.  Normal effort Cardio: S1-S2 is normal regular.  No S3-S4.  No rubs murmurs or bruit GI: Abdomen is soft.  Nontender nondistended.  Bowel sounds are present normal.  No masses organomegaly Extremities: No edema.  Full range of motion of lower extremities. Neurologic: Alert and oriented x3.  No focal neurological deficits.    Data Reviewed: I have personally reviewed following labs and imaging studies CBC: Recent Labs  Lab 07/06/20 1824 07/07/20 0251 07/07/20 0621 07/08/20 0438 07/08/20 1304 07/08/20 1953 07/09/20 0541  WBC 15.5* 10.3 8.1 6.0  --   --  4.2  NEUTROABS 12.4*  --   --   --   --   --   --   HGB 17.7* 13.8 12.3* 10.7* 10.7* 11.0* 11.6*  HCT 53.8* 42.1 36.9* 33.6* 32.6* 33.6* 35.3*  MCV 97.5 97.5 97.4 101.2*  --   --  97.2  PLT 317 216 164 135*  --   --  123456*   Basic Metabolic Panel: Recent Labs  Lab 07/06/20 1814 07/07/20 0251 07/07/20 0621 07/08/20 0438 07/08/20 1304 07/09/20 0541  NA 142  --  137 134* 143 141  K 5.7*  --  4.7 4.7 5.1 4.9  CL 97*  --  100 103 106 103  CO2 29  --  27 26 31 30   GLUCOSE 136*  --  87 70 100* 86  BUN 39*  --  43* 34* 29* 21  CREATININE 2.62* 2.80* 2.72* 1.81* 1.57* 1.33*  CALCIUM 10.6*  --  8.8* 8.5*  9.1 9.3   GFR: Estimated Creatinine Clearance: 70.2 mL/min (A) (by C-G formula based on SCr of 1.33 mg/dL (H)). Liver Function Tests: Recent Labs  Lab 07/06/20 1814 07/07/20 0621  AST 31 18  ALT 23 17  ALKPHOS 84 52  BILITOT 1.0 0.5  PROT 8.3* 5.9*  ALBUMIN 4.8 3.4*   Recent Labs  Lab 07/06/20 1814  LIPASE 40   Coagulation Profile: Recent Labs  Lab 07/06/20 1824  INR 1.0   Sepsis Labs: Recent Labs  Lab 07/07/20 0251 07/07/20 0621 07/07/20 1342 07/07/20 1700 07/07/20 2114 07/08/20 0438  PROCALCITON 0.13  --   --   --   --   --   LATICACIDVEN 1.2   < > 1.1 2.1* 3.7* 0.9   < > = values in this interval not displayed.    Recent Results (from the past 240 hour(s))  Blood Culture (routine x 2)     Status: None (Preliminary result)   Collection Time: 07/06/20  6:24 PM   Specimen: BLOOD  Result Value Ref Range Status   Specimen Description   Final    BLOOD RIGHT ANTECUBITAL Performed at [facility] [address] [place], [place] [address]    Special Requests   Final    BOTTLES DRAWN AEROBIC AND ANAEROBIC Blood Culture adequate volume Performed at [facility] [address] [place], [place] [address]    Culture   Final    NO GROWTH 2 DAYS Performed at [facility] [address], [place], [place] [address]    Report Status PENDING  Incomplete  SARS Coronavirus 2 by RT PCR (hospital order, performed in [facility] hospital lab) Nasopharyngeal Nasopharyngeal Swab     Status: None   Collection Time: 07/06/20  6:49 PM   Specimen: Nasopharyngeal Swab  Result Value Ref Range Status   SARS Coronavirus 2 NEGATIVE NEGATIVE Final    Comment: (NOTE) SARS-CoV-2 target nucleic acids are NOT DETECTED.  The SARS-CoV-2 RNA is generally detectable in upper and lower respiratory specimens during the acute phase of infection.  The lowest concentration of SARS-CoV-2 viral copies this assay can detect is 250 copies / mL. A  negative result does not preclude SARS-CoV-2 infection and should not be used as the sole basis for treatment or other patient management decisions.  A negative result may occur with improper specimen collection / handling, submission of specimen other than nasopharyngeal swab, presence of viral mutation(s) within the areas targeted by this assay, and inadequate number of viral copies (<250 copies / mL). A negative result must be combined with clinical observations, patient history, and epidemiological information.  Fact Sheet for Patients:   StrictlyIdeas.no  Fact Sheet for Healthcare Providers: BankingDealers.co.za  This test is not yet approved or  cleared by the [place] FDA and has been authorized for detection and/or diagnosis of SARS-CoV-2 by FDA under an Emergency Use Authorization (EUA).  This EUA will remain in effect (meaning this test can be used) for the duration of the COVID-19 declaration under Section 564(b)(1) of the Act, 21 U.S.C. section 360bbb-3(b)(1), unless the authorization is terminated or revoked sooner.  Performed at [facility], [place] [address] [place], [place] [address]   Blood Culture (routine x 2)     Status: None (Preliminary result)   Collection Time: 07/06/20  9:15 PM   Specimen: BLOOD  Result Value Ref Range Status   Specimen Description   Final    BLOOD RIGHT WRIST Performed at [facility] [address] [place], [place] [address]    Special Requests   Final    BOTTLES DRAWN AEROBIC AND ANAEROBIC Blood Culture adequate volume Performed at [facility] [address] [place], [place] [address]    Culture   Final    NO GROWTH 1 DAY Performed at [facility], [place] [address] [place], [place] [address]    Report Status PENDING  Incomplete  Urine culture     Status: None   Collection Time: 07/07/20  9:00 PM   Specimen:  In/Out Cath Urine  Result Value Ref Range Status   Specimen Description   Final    IN/OUT CATH URINE Performed at [facility] [address], [place], [place] [address]    Special Requests   Final    NONE Performed at [facility], [place] [address], [place], [place] [address]    Culture   Final    NO GROWTH Performed at [facility], [place] [address] [place], [place] [address]    Report Status 07/09/2020 FINAL  Final  C Difficile Quick Screen w PCR reflex     Status: None   Collection Time: 07/08/20 12:08 PM   Specimen: STOOL  Result Value Ref Range Status   C Diff antigen NEGATIVE NEGATIVE Final   C Diff toxin NEGATIVE NEGATIVE Final   C Diff interpretation No C. difficile detected.  Final    Comment: Performed at [facility], [place] [address], [place], [place] [address]     Radiology Studies: DG Chest 1 View  Result Date: 07/07/2020 CLINICAL DATA:  Nausea and weakness EXAM: CHEST  1 VIEW COMPARISON:  07/06/2020 FINDINGS: Mild lingular scarring. No focal consolidation. No pleural effusion or pneumothorax. Heart and mediastinal contours are unremarkable. No acute osseous abnormality. IMPRESSION: No active disease. Electronically Signed   By: Kathreen Devoid   On: 07/07/2020 18:41   US RENAL  Result Date: 07/07/2020 CLINICAL DATA:  Acute renal insufficiency, remote partial right nephrectomy EXAM: RENAL /  URINARY TRACT ULTRASOUND COMPLETE COMPARISON:  None. FINDINGS: Right Kidney: Renal measurements: 9.3 x 4.9 x 4.8 cm = volume: 113 mL. Echogenicity within normal limits. No mass or hydronephrosis visualized. There is mild focal cortical thinning within the mid to upper pole of the right kidney possibly reflecting the sequela of surgical resection. Left Kidney: Renal measurements: 10.3 x 5.3 x 4.7 cm = volume: 133 mL. Echogenicity within normal limits. No mass or hydronephrosis visualized. Bladder: Not well visualized, possibly due  to decompression. Other: None. IMPRESSION: No acute abnormality. Electronically Signed   By: Fidela Salisbury MD   On: 07/07/2020 12:23   ECHOCARDIOGRAM COMPLETE  Result Date: 07/07/2020    ECHOCARDIOGRAM REPORT   Patient Name:   Jonathan Wilson Date of Exam: 07/07/2020 Medical Rec #:  [id number]     Height:       75.0 in Accession #:    [id number]    Weight:       235.0 lb Date of Birth:  1950/09/24     BSA:          2.350 m Patient Age:    78 years      BP:           101/63 mmHg Patient Gender: M             HR:           58 bpm. Exam Location:  Inpatient Procedure: 2D Echo, Cardiac Doppler and Color Doppler Indications:    R07.9* Chest pain, unspecified  History:        Patient has prior history of Echocardiogram examinations, most                 recent 09/18/2015. COPD, Signs/Symptoms:Syncope; Risk                 Factors:Hypertension, Current Smoker and Dyslipidemia.                 Psychosis. Opiate use. Methadone.  Sonographer:    Roseanna Rainbow RDCS Referring Phys: [place]  Sonographer Comments: Technically difficult study due to poor echo windows. IMPRESSIONS  1. Left ventricular ejection fraction, by estimation, is 60 to 65%. The left ventricle has normal function. The left ventricle has no regional wall motion abnormalities. There is mild left ventricular hypertrophy. Left ventricular diastolic parameters are consistent with Grade I diastolic dysfunction (impaired relaxation).  2. Right ventricular systolic function is normal. The right ventricular size is mildly enlarged.  3. Right atrial size was mildly dilated.  4. The mitral valve is normal in structure. No evidence of mitral valve regurgitation. No evidence of mitral stenosis.  5. The aortic valve is grossly normal. Aortic valve regurgitation is not visualized. Mild aortic valve sclerosis is present, with no evidence of aortic valve stenosis.  6. The inferior vena cava is normal in size with greater than 50% respiratory variability,  suggesting right atrial pressure of 3 mmHg. FINDINGS  Left Ventricle: Left ventricular ejection fraction, by estimation, is 60 to 65%. The left ventricle has normal function. The left ventricle has no regional wall motion abnormalities. The left ventricular internal cavity size was normal in size. There is  mild left ventricular hypertrophy. Left ventricular diastolic parameters are consistent with Grade I diastolic dysfunction (impaired relaxation). Right Ventricle: The right ventricular size is mildly enlarged. No increase in right ventricular wall thickness. Right ventricular systolic function is normal. Left Atrium: Left atrial size was normal in size. Right Atrium: Right  atrial size was mildly dilated. Pericardium: There is no evidence of pericardial effusion. Mitral Valve: The mitral valve is normal in structure. Mild mitral annular calcification. No evidence of mitral valve regurgitation. No evidence of mitral valve stenosis. Tricuspid Valve: The tricuspid valve is normal in structure. Tricuspid valve regurgitation is not demonstrated. No evidence of tricuspid stenosis. Aortic Valve: The aortic valve is grossly normal. Aortic valve regurgitation is not visualized. Mild aortic valve sclerosis is present, with no evidence of aortic valve stenosis. Pulmonic Valve: The pulmonic valve was normal in structure. Pulmonic valve regurgitation is not visualized. No evidence of pulmonic stenosis. Aorta: The aortic root is normal in size and structure. Venous: The inferior vena cava is normal in size with greater than 50% respiratory variability, suggesting right atrial pressure of 3 mmHg. IAS/Shunts: No atrial level shunt detected by color flow Doppler.  LEFT VENTRICLE PLAX 2D LVIDd:         4.30 cm      Diastology LVIDs:         3.10 cm      LV e' medial:    11.60 cm/s LV PW:         1.20 cm      LV E/e' medial:  6.3 LV IVS:        1.30 cm      LV e' lateral:   13.60 cm/s LVOT diam:     2.30 cm      LV E/e' lateral: 5.4  LV SV:         101 LV SV Index:   43 LVOT Area:     4.15 cm  LV Volumes (MOD) LV vol d, MOD A2C: 83.3 ml LV vol d, MOD A4C: 102.0 ml LV vol s, MOD A2C: 28.2 ml LV vol s, MOD A4C: 31.1 ml LV SV MOD A2C:     55.2 ml LV SV MOD A4C:     102.0 ml LV SV MOD BP:      66.1 ml RIGHT VENTRICLE             IVC RV S prime:     11.40 cm/s  IVC diam: 2.10 cm TAPSE (M-mode): 2.4 cm LEFT ATRIUM             Index       RIGHT ATRIUM           Index LA diam:        3.60 cm 1.53 cm/m  RA Area:     20.50 cm LA Vol (A2C):   40.9 ml 17.40 ml/m RA Volume:   67.00 ml  28.51 ml/m LA Vol (A4C):   52.5 ml 22.34 ml/m LA Biplane Vol: 49.7 ml 21.15 ml/m  AORTIC VALVE LVOT Vmax:   114.00 cm/s LVOT Vmean:  73.000 cm/s LVOT VTI:    0.243 m  AORTA Ao Root diam: 3.80 cm Ao Asc diam:  3.50 cm MITRAL VALVE MV Area (PHT): 3.85 cm     SHUNTS MV Decel Time: 197 msec     Systemic VTI:  0.24 m MV E velocity: 73.10 cm/s   Systemic Diam: 2.30 cm MV A velocity: 109.00 cm/s MV E/A ratio:  0.67 Candee Furbish MD Electronically signed by Candee Furbish MD Signature Date/Time: 07/07/2020/3:45:26 PM    Final      LOS: 2 days   Bonnielee Haff, MD [facility]  07/09/2020, 11:25 AM

## 2020-07-10 LAB — BASIC METABOLIC PANEL
Anion gap: 10 (ref 5–15)
BUN: 15 mg/dL (ref 8–23)
CO2: 29 mmol/L (ref 22–32)
Calcium: 9.1 mg/dL (ref 8.9–10.3)
Chloride: 101 mmol/L (ref 98–111)
Creatinine, Ser: 1.14 mg/dL (ref 0.61–1.24)
GFR, Estimated: >60 mL/min (ref >60)
Glucose, Bld: 91 mg/dL (ref 70–99)
Potassium: 4.2 mmol/L (ref 3.5–5.1)
Sodium: 140 mmol/L (ref 135–145)

## 2020-07-10 LAB — CBC
HCT: 37.3 % — ABNORMAL LOW (ref 39.0–52.0)
Hemoglobin: 12.6 g/dL — ABNORMAL LOW (ref 13.0–17.0)
MCH: 31.9 pg (ref 26.0–34.0)
MCHC: 33.8 g/dL (ref 30.0–36.0)
MCV: 94.4 fL (ref 80.0–100.0)
Platelets: 151 10*3/uL (ref 150–400)
RBC: 3.95 MIL/uL — ABNORMAL LOW (ref 4.22–5.81)
RDW: 12.1 % (ref 11.5–15.5)
WBC: 4.9 10*3/uL (ref 4.0–10.5)
nRBC: 0 % (ref 0.0–0.2)

## 2020-07-10 MED ORDER — CIPROFLOXACIN HCL 500 MG PO TABS: 500.0000 mg | ORAL_TABLET | Freq: Two times a day (BID) | ORAL | 0 refills | Status: AC

## 2020-07-10 MED ORDER — METRONIDAZOLE 500 MG PO TABS: 500.0000 mg | ORAL_TABLET | Freq: Three times a day (TID) | ORAL | 0 refills | Status: AC

## 2020-07-10 NOTE — Progress Notes (Signed)
Lab [phone]) with call of GI Panel result is negative.  Patient currently on enteric precautions.  Will updated Charge Nurse for possible removal of enteric precautions.

## 2020-07-10 NOTE — Discharge Instructions (Signed)
Diarrhea, Adult Diarrhea is frequent loose and watery bowel movements. Diarrhea can make you feel weak and cause you to become dehydrated. Dehydration can make you tired and thirsty, cause you to have a dry mouth, and decrease how often you urinate. Diarrhea typically lasts 2-3 days. However, it can last longer if it is a sign of something more serious. It is important to treat your diarrhea as told by your health care provider. Follow these instructions at home: Eating and drinking Follow these recommendations as told by your health care provider:  Take an oral rehydration solution (ORS). This is an over-the-counter medicine that helps return your body to its normal balance of nutrients and water. It is found at pharmacies and retail stores.  Drink plenty of fluids, such as water, ice chips, diluted fruit juice, and low-calorie sports drinks. You can drink milk also, if desired.  Avoid drinking fluids that contain a lot of sugar or caffeine, such as energy drinks, sports drinks, and soda.  Eat bland, easy-to-digest foods in small amounts as you are able. These foods include bananas, applesauce, rice, lean meats, toast, and crackers.  Avoid alcohol.  Avoid spicy or fatty foods.      Medicines  Take over-the-counter and prescription medicines only as told by your health care provider.  If you were prescribed an antibiotic medicine, take it as told by your health care provider. Do not stop using the antibiotic even if you start to feel better. General instructions  Wash your hands often using soap and water. If soap and water are not available, use a hand sanitizer. Others in the household should wash their hands as well. Hands should be washed: ? After using the toilet or changing a diaper. ? Before preparing, cooking, or serving food. ? While caring for a sick person or while visiting someone in a hospital.  Drink enough fluid to keep your urine pale yellow.  Rest at home while you  recover.  Watch your condition for any changes.  Take a warm bath to relieve any burning or pain from frequent diarrhea episodes.  Keep all follow-up visits as told by your health care provider. This is important.   Contact a health care provider if:  You have a fever.  Your diarrhea gets worse.  You have new symptoms.  You cannot keep fluids down.  You feel light-headed or dizzy.  You have a headache.  You have muscle cramps. Get help right away if:  You have chest pain.  You feel extremely weak or you faint.  You have bloody or black stools or stools that look like tar.  You have severe pain, cramping, or bloating in your abdomen.  You have trouble breathing or you are breathing very quickly.  Your heart is beating very quickly.  Your skin feels cold and clammy.  You feel confused.  You have signs of dehydration, such as: ? Dark urine, very little urine, or no urine. ? Cracked lips. ? Dry mouth. ? Sunken eyes. ? Sleepiness. ? Weakness. Summary  Diarrhea is frequent loose and watery bowel movements. Diarrhea can make you feel weak and cause you to become dehydrated.  Drink enough fluids to keep your urine pale yellow.  Make sure that you wash your hands after using the toilet. If soap and water are not available, use hand sanitizer.  Contact a health care provider if your diarrhea gets worse or you have new symptoms.  Get help right away if you have signs of dehydration.  This information is not intended to replace advice given to you by your health care provider. Make sure you discuss any questions you have with your health care provider. Document Revised: 10/06/2018 Document Reviewed: 10/24/2017 Elsevier Patient Education  2021 [employer].

## 2020-07-10 NOTE — Progress Notes (Signed)
Discharge instructions reviewed, questions concerns were denied at this time. Pt is stable, no change from am assessment. Pt A&Ox4, ambulatory without assistance.

## 2020-07-10 NOTE — Discharge Summary (Signed)
[facility]  Physician Discharge Summary   Patient ID: Jonathan Wilson MRN: [id number] DOB/AGE: [birth year] 70 y.o.  Admit date: 07/06/2020 Discharge date: 07/10/2020  PCP: Vicenta Aly, FNP  DISCHARGE DIAGNOSES:  Acute diarrhea, presumed infectious Transient hematochezia, resolved Chronic kidney disease stage IIIa History of COPD Essential hypertension Chronic methadone use  RECOMMENDATIONS FOR OUTPATIENT FOLLOW UP: 1. Patient told to follow-up with PCP to discuss referral to his gastroenterologist at [place]: None Equipment/Devices: None  CODE STATUS: Full code  DISCHARGE CONDITION: fair  Diet recommendation: As before  INITIAL HISTORY: 70 year old male with essential hypertension, chronic kidney disease stage II CKDI, baseline Creat 1.06 Jan 2019, on 120 mg methadone for chronic opiate use started 6 weeks ago, admitted with multiple episodes of diarrhea 07/06/2020. Patient was also noted to have acute kidney injury. Was hospitalized for further management.  Consultations:  [facility]  Procedures:  None   HOSPITAL COURSE:   Acute diarrhea with hematochezia Patient had multiple loose stools. Had one episode of blood in stool. Gastroenterology was consulted. Hemoglobin has been stable. No further episodes of rectal bleeding noted. C. difficile test was negative. FOBT was positive. GI pathogen panel is negative as well.  Patient is diarrhea resolved.  Since his hematochezia was transient he does not need to undergo any endoscopy in the hospital.  Patient can follow-up with his usual gastroenterology via his primary care provider.   Patient was empirically started on Cipro and Flagyl.  He will will be prescribed the same to complete a 5-day course.    AKI on CKD IIIa/acute urinary retention/hyperkalemia Baseline creatinine ~1.5 in 2020. Suspect AKI is multifactorial from severe hypovolemia and urine retention. This is also in the  setting of ACE inhibitor and diuretic use at home. Foley catheter had to be placed. Has been voiding well. Renal ultrasound did not show any acute findings. Foley was removed 2/5.  Renal function has improved.   SIRS w/ leukocytosis/lactic acidosis Likely due to hypovolemia and hypotension.   Chest Pain Patient had no significant elevation in troponin. EKG did not show any acute ischemic changes. Echocardiogram shows normal systolic function. No regional wall motion abnormalities noted. Symptoms have resolved.  Did not have recurrence of his symptoms.  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  He goes to the methadone clinic daily to get medications.  History of renal cell cancer Previous history of right partial nephrectomy.  History hepatitis C Treated at [facility]  Infrarenal aortic aneurysm  Unchanged at 3.4 cm. Will need to advise follow-up every 3 years per recommendation   Overall stable.  Okay for discharge home today since symptoms have almost resolved.   PERTINENT LABS:  The results of significant diagnostics from this hospitalization (including imaging, microbiology, ancillary and laboratory) are listed below for reference.    Microbiology: Recent Results (from the past 240 hour(s))  Blood Culture (routine x 2)     Status: None (Preliminary result)   Collection Time: 07/06/20  6:24 PM   Specimen: BLOOD  Result Value Ref Range Status   Specimen Description   Final    BLOOD RIGHT ANTECUBITAL Performed at [facility] [address] [place], [place] [address]    Special Requests   Final    BOTTLES DRAWN AEROBIC AND ANAEROBIC Blood Culture adequate volume Performed at [facility], [place] [address] [place], [place] [address]    Culture   Final    NO GROWTH 4 DAYS Performed at [facility], [place] [address] [place], [place] [address]    Report Status PENDING  Incomplete  SARS Coronavirus 2 by RT PCR (hospital order, performed in [facility] hospital lab) Nasopharyngeal Nasopharyngeal Swab     Status: None   Collection Time: 07/06/20  6:49 PM   Specimen: Nasopharyngeal Swab  Result Value Ref Range Status   SARS Coronavirus 2 NEGATIVE NEGATIVE Final    Comment: (NOTE) SARS-CoV-2 target nucleic acids are NOT DETECTED.  The SARS-CoV-2 RNA is generally detectable in upper and lower respiratory specimens during the acute phase of infection. The lowest concentration of SARS-CoV-2 viral copies this assay can detect is 250 copies / mL. A negative result does not preclude SARS-CoV-2 infection and should not be used as the sole basis for treatment or other patient management decisions.  A negative result may occur with improper specimen collection / handling, submission of specimen other than nasopharyngeal swab, presence of viral mutation(s) within the areas targeted by this assay, and inadequate number of viral copies (<250 copies / mL). A negative result must be combined with clinical observations, patient history, and epidemiological information.  Fact Sheet for Patients:   StrictlyIdeas.no  Fact Sheet for Healthcare Providers: BankingDealers.co.za  This test is not yet approved or  cleared by the [place] FDA and has been authorized for detection and/or diagnosis of SARS-CoV-2 by FDA under an Emergency Use Authorization (EUA).  This EUA will remain in effect (meaning this test can be used) for the duration of the COVID-19 declaration under Section 564(b)(1) of the Act, 21 U.S.C. section 360bbb-3(b)(1), unless the authorization is terminated or revoked sooner.  Performed at [facility], [place] [address] [place], [place] [address]   Blood Culture (routine x 2)     Status: None (Preliminary  result)   Collection Time: 07/06/20  9:15 PM   Specimen: BLOOD  Result Value Ref Range Status   Specimen Description   Final    BLOOD RIGHT WRIST Performed at [facility] [address] [place], [place] [address]    Special Requests   Final    BOTTLES DRAWN AEROBIC AND ANAEROBIC Blood Culture adequate volume Performed at [facility] [address] [place], [place] [address]    Culture   Final    NO GROWTH 3 DAYS Performed at [facility], [place] [address], [place], [place] [address]    Report Status PENDING  Incomplete  Urine culture     Status: None   Collection Time: 07/07/20  9:00 PM   Specimen: In/Out Cath Urine  Result Value Ref Range Status   Specimen Description   Final    IN/OUT CATH URINE Performed at [facility] [address] [place], [place] [address]    Special Requests   Final    NONE Performed at [facility], [place] [address] [place], [place] [address]    Culture   Final    NO GROWTH Performed at [facility], [place] [address], [place], [place] [address]    Report Status 07/09/2020 FINAL  Final  C Difficile Quick Screen w PCR reflex     Status: None   Collection Time: 07/08/20 12:08 PM   Specimen: STOOL  Result Value Ref Range Status   C Diff antigen NEGATIVE  NEGATIVE Final   C Diff toxin NEGATIVE NEGATIVE Final   C Diff interpretation No C. difficile detected.  Final    Comment: Performed at [facility], [place] [address], [place], [place] [address]  Gastrointestinal Panel by PCR , Stool     Status: None   Collection Time: 07/08/20 12:08 PM   Specimen: STOOL  Result Value Ref Range Status   Campylobacter species NOT DETECTED NOT DETECTED Final   Plesimonas shigelloides NOT DETECTED NOT DETECTED Final   Salmonella species NOT DETECTED NOT DETECTED Final   Yersinia enterocolitica NOT DETECTED NOT DETECTED Final   Vibrio species NOT DETECTED NOT  DETECTED Final   Vibrio cholerae NOT DETECTED NOT DETECTED Final   Enteroaggregative E coli (EAEC) NOT DETECTED NOT DETECTED Final   Enteropathogenic E coli (EPEC) NOT DETECTED NOT DETECTED Final   Enterotoxigenic E coli (ETEC) NOT DETECTED NOT DETECTED Final   Shiga like toxin producing E coli (STEC) NOT DETECTED NOT DETECTED Final   Shigella/Enteroinvasive E coli (EIEC) NOT DETECTED NOT DETECTED Final   Cryptosporidium NOT DETECTED NOT DETECTED Final   Cyclospora cayetanensis NOT DETECTED NOT DETECTED Final   Entamoeba histolytica NOT DETECTED NOT DETECTED Final   Giardia lamblia NOT DETECTED NOT DETECTED Final   Adenovirus F40/41 NOT DETECTED NOT DETECTED Final   Astrovirus NOT DETECTED NOT DETECTED Final   Norovirus GI/GII NOT DETECTED NOT DETECTED Final   Rotavirus A NOT DETECTED NOT DETECTED Final   Sapovirus (I, II, IV, and V) NOT DETECTED NOT DETECTED Final    Comment: Performed at [facility], [address] [place] [place], [place] [address]     Labs:     Basic Metabolic Panel: Recent Labs  Lab 07/07/20 0621 07/08/20 0438 07/08/20 1304 07/09/20 0541 07/10/20 0448  NA 137 134* 143 141 140  K 4.7 4.7 5.1 4.9 4.2  CL 100 103 106 103 101  CO2 27 26 31 30 29   GLUCOSE 87 70 100* 86 91  BUN 43* 34* 29* 21 15  CREATININE 2.72* 1.81* 1.57* 1.33* 1.14  CALCIUM 8.8* 8.5* 9.1 9.3 9.1   Liver Function Tests: Recent Labs  Lab 07/06/20 1814 07/07/20 0621  AST 31 18  ALT 23 17  ALKPHOS 84 52  BILITOT 1.0 0.5  PROT 8.3* 5.9*  ALBUMIN 4.8 3.4*   Recent Labs  Lab 07/06/20 1814  LIPASE 40   CBC: Recent Labs  Lab 07/06/20 1824 07/07/20 0251 07/07/20 0621 07/08/20 0438 07/08/20 1304 07/08/20 1953 07/09/20 0541 07/10/20 0448  WBC 15.5* 10.3 8.1 6.0  --   --  4.2 4.9  NEUTROABS 12.4*  --   --   --   --   --   --   --   HGB 17.7* 13.8 12.3* 10.7* 10.7* 11.0* 11.6* 12.6*  HCT 53.8* 42.1 36.9* 33.6* 32.6* 33.6* 35.3* 37.3*  MCV 97.5 97.5 97.4 101.2*  --    --  97.2 94.4  PLT 317 216 164 135*  --   --  148* 151     IMAGING STUDIES CT ABDOMEN PELVIS WO CONTRAST  Result Date: 07/06/2020 CLINICAL DATA:  Acute abdominal pain. Nausea, diarrhea, and weakness. EXAM: CT ABDOMEN AND PELVIS WITHOUT CONTRAST TECHNIQUE: Multidetector CT imaging of the abdomen and pelvis was performed following the standard protocol without IV contrast. COMPARISON:  CT 05/21/2019 FINDINGS: Lower chest: Minor basilar atelectasis. No confluent airspace disease. Coronary artery calcifications. Hepatobiliary: No focal hepatic abnormality. Cholecystectomy without biliary dilatation. Pancreas: Parenchymal atrophy. No  ductal dilatation or inflammation. Spleen: Normal in size without focal abnormality. Adrenals/Urinary Tract: Normal adrenal glands. Postsurgical change in the upper right kidney post partial nephrectomy. No evidence of recurrent mass. No hydronephrosis. No perinephric edema. No renal calculi. Urinary bladder is minimally distended. Wall thickening is likely related to degree of distension. Stomach/Bowel: Fluid distended stomach. 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 Few fluid-filled pelvic bowel loops without abnormal distension or inflammation. Appendix is normal. Liquid stool in the cecum and ascending colon, also distal descending and sigmoid colon. Transverse colon is decompressed which limits assessment for wall thickening. No other colonic wall thickening is seen. Occasional sigmoid diverticula without diverticulitis. Vascular/Lymphatic: Calcified aortic atherosclerosis. Questionable vessel thickening of the infrarenal aorta, stable. Infrarenal aortic aneurysm at 3.4 cm. This is unchanged from prior. Moderate branch atherosclerosis. No enlarged lymph nodes in the abdomen or pelvis. Small paraesophageal nodes adjacent to the distal esophagus unchanged. Reproductive: Prostate is unremarkable. Other: No ascites or free air. Small fat  containing umbilical hernia. Musculoskeletal: There are no acute or suspicious osseous abnormalities. Degenerative disc disease at L5-S1. IMPRESSION: 1. Liquid stool in the colon, can be seen with diarrheal illness. No bowel obstruction or inflammation. 2. Post right partial nephrectomy without evidence of recurrent mass. 3. Unchanged infrarenal aortic aneurysm at 3.4 cm. Recommend follow-up every 3 years. This recommendation follows ACR consensus guidelines: White Paper of the ACR Incidental Findings Committee II on Vascular Findings. J Am Coll Radiol 2013; 10:789-794. Aortic Atherosclerosis (ICD10-I70.0). Electronically Signed   By: Keith Rake M.D.   On: 07/06/2020 21:56   DG Chest 1 View  Result Date: 07/07/2020 CLINICAL DATA:  Nausea and weakness EXAM: CHEST  1 VIEW COMPARISON:  07/06/2020 FINDINGS: Mild lingular scarring. No focal consolidation. No pleural effusion or pneumothorax. Heart and mediastinal contours are unremarkable. No acute osseous abnormality. IMPRESSION: No active disease. Electronically Signed   By: Kathreen Devoid   On: 07/07/2020 18:41   US RENAL  Result Date: 07/07/2020 CLINICAL DATA:  Acute renal insufficiency, remote partial right nephrectomy EXAM: RENAL / URINARY TRACT ULTRASOUND COMPLETE COMPARISON:  None. FINDINGS: Right Kidney: Renal measurements: 9.3 x 4.9 x 4.8 cm = volume: 113 mL. Echogenicity within normal limits. No mass or hydronephrosis visualized. There is mild focal cortical thinning within the mid to upper pole of the right kidney possibly reflecting the sequela of surgical resection. Left Kidney: Renal measurements: 10.3 x 5.3 x 4.7 cm = volume: 133 mL. Echogenicity within normal limits. No mass or hydronephrosis visualized. Bladder: Not well visualized, possibly due to decompression. Other: None. IMPRESSION: No acute abnormality. Electronically Signed   By: Fidela Salisbury MD   On: 07/07/2020 12:23   DG Chest Port 1 View  Result Date: 07/06/2020 CLINICAL DATA:   70 year old male with concern for sepsis. EXAM: PORTABLE CHEST 1 VIEW COMPARISON:  Chest radiograph dated 05/20/2019. FINDINGS: No focal consolidation, pleural effusion or pneumothorax. The cardiac silhouette is within limits. Atherosclerotic calcification of the aorta. No acute osseous pathology. IMPRESSION: No active disease. Electronically Signed   By: Anner Crete M.D.   On: 07/06/2020 18:53   ECHOCARDIOGRAM COMPLETE  Result Date: 07/07/2020    ECHOCARDIOGRAM REPORT   Patient Name:   CHARL MENGESHA Raneri Date of Exam: 07/07/2020 Medical Rec #:  [id number]     Height:       75.0 in Accession #:    [id number]    Weight:       235.0 lb Date of Birth:  April 26, 1951     BSA:          2.350 m Patient Age:    75 years      BP:           101/63 mmHg Patient Gender: M             HR:           58 bpm. Exam Location:  Inpatient Procedure: 2D Echo, Cardiac Doppler and Color Doppler Indications:    R07.9* Chest pain, unspecified  History:        Patient has prior history of Echocardiogram examinations, most                 recent 09/18/2015. COPD, Signs/Symptoms:Syncope; Risk                 Factors:Hypertension, Current Smoker and Dyslipidemia.                 Psychosis. Opiate use. Methadone.  Sonographer:    Roseanna Rainbow RDCS Referring Phys: [place]  Sonographer Comments: Technically difficult study due to poor echo windows. IMPRESSIONS  1. Left ventricular ejection fraction, by estimation, is 60 to 65%. The left ventricle has normal function. The left ventricle has no regional wall motion abnormalities. There is mild left ventricular hypertrophy. Left ventricular diastolic parameters are consistent with Grade I diastolic dysfunction (impaired relaxation).  2. Right ventricular systolic function is normal. The right ventricular size is mildly enlarged.  3. Right atrial size was mildly dilated.  4. The mitral valve is normal in structure. No evidence of mitral valve regurgitation. No evidence of mitral stenosis.   5. The aortic valve is grossly normal. Aortic valve regurgitation is not visualized. Mild aortic valve sclerosis is present, with no evidence of aortic valve stenosis.  6. The inferior vena cava is normal in size with greater than 50% respiratory variability, suggesting right atrial pressure of 3 mmHg. FINDINGS  Left Ventricle: Left ventricular ejection fraction, by estimation, is 60 to 65%. The left ventricle has normal function. The left ventricle has no regional wall motion abnormalities. The left ventricular internal cavity size was normal in size. There is  mild left ventricular hypertrophy. Left ventricular diastolic parameters are consistent with Grade I diastolic dysfunction (impaired relaxation). Right Ventricle: The right ventricular size is mildly enlarged. No increase in right ventricular wall thickness. Right ventricular systolic function is normal. Left Atrium: Left atrial size was normal in size. Right Atrium: Right atrial size was mildly dilated. Pericardium: There is no evidence of pericardial effusion. Mitral Valve: The mitral valve is normal in structure. Mild mitral annular calcification. No evidence of mitral valve regurgitation. No evidence of mitral valve stenosis. Tricuspid Valve: The tricuspid valve is normal in structure. Tricuspid valve regurgitation is not demonstrated. No evidence of tricuspid stenosis. Aortic Valve: The aortic valve is grossly normal. Aortic valve regurgitation is not visualized. Mild aortic valve sclerosis is present, with no evidence of aortic valve stenosis. Pulmonic Valve: The pulmonic valve was normal in structure. Pulmonic valve regurgitation is not visualized. No evidence of pulmonic stenosis. Aorta: The aortic root is normal in size and structure. Venous: The inferior vena cava is normal in size with greater than 50% respiratory variability, suggesting right atrial pressure of 3 mmHg. IAS/Shunts: No atrial level shunt detected by color flow Doppler.  LEFT  VENTRICLE PLAX 2D LVIDd:         4.30 cm      Diastology LVIDs:  3.10 cm      LV e' medial:    11.60 cm/s LV PW:         1.20 cm      LV E/e' medial:  6.3 LV IVS:        1.30 cm      LV e' lateral:   13.60 cm/s LVOT diam:     2.30 cm      LV E/e' lateral: 5.4 LV SV:         101 LV SV Index:   43 LVOT Area:     4.15 cm  LV Volumes (MOD) LV vol d, MOD A2C: 83.3 ml LV vol d, MOD A4C: 102.0 ml LV vol s, MOD A2C: 28.2 ml LV vol s, MOD A4C: 31.1 ml LV SV MOD A2C:     55.2 ml LV SV MOD A4C:     102.0 ml LV SV MOD BP:      66.1 ml RIGHT VENTRICLE             IVC RV S prime:     11.40 cm/s  IVC diam: 2.10 cm TAPSE (M-mode): 2.4 cm LEFT ATRIUM             Index       RIGHT ATRIUM           Index LA diam:        3.60 cm 1.53 cm/m  RA Area:     20.50 cm LA Vol (A2C):   40.9 ml 17.40 ml/m RA Volume:   67.00 ml  28.51 ml/m LA Vol (A4C):   52.5 ml 22.34 ml/m LA Biplane Vol: 49.7 ml 21.15 ml/m  AORTIC VALVE LVOT Vmax:   114.00 cm/s LVOT Vmean:  73.000 cm/s LVOT VTI:    0.243 m  AORTA Ao Root diam: 3.80 cm Ao Asc diam:  3.50 cm MITRAL VALVE MV Area (PHT): 3.85 cm     SHUNTS MV Decel Time: 197 msec     Systemic VTI:  0.24 m MV E velocity: 73.10 cm/s   Systemic Diam: 2.30 cm MV A velocity: 109.00 cm/s MV E/A ratio:  0.67 Candee Furbish MD Electronically signed by Candee Furbish MD Signature Date/Time: 07/07/2020/3:45:26 PM    Final     DISCHARGE EXAMINATION: Vitals:   07/09/20 2300 07/10/20 0300 07/10/20 0500 07/10/20 1005  BP: (!) 153/63  133/77 (!) 178/99  Pulse: 60  71 (!) 59  Resp:    18  Temp: 98.3 F (36.8 C)  98.1 F (36.7 C)   TempSrc: Oral  Oral   SpO2: 96% 96% 96% 99%  Weight:      Height:       General appearance: Awake alert.  In no distress Resp: Clear to auscultation bilaterally.  Normal effort Cardio: S1-S2 is normal regular.  No S3-S4.  No rubs murmurs or bruit GI: Abdomen is soft.  Nontender nondistended.  Bowel sounds are present normal.  No masses organomegaly     DISPOSITION:  Home  Discharge Instructions    Call MD for:  difficulty breathing, headache or visual disturbances   Complete by: As directed    Call MD for:  extreme fatigue   Complete by: As directed    Call MD for:  hives   Complete by: As directed    Call MD for:  persistant dizziness or light-headedness   Complete by: As directed    Call MD for:  persistant nausea and vomiting   Complete by: As  directed    Call MD for:  severe uncontrolled pain   Complete by: As directed    Call MD for:  temperature >100.4   Complete by: As directed    Diet - low sodium heart healthy   Complete by: As directed    Discharge instructions   Complete by: As directed    Please be sure to follow-up with your primary care provider.  You may need to be referred back to gastroenterology to see if you need to undergo another colonoscopy.  Please seek attention immediately if you have rectal bleeding.  Take your medications as prescribed.  Stay well-hydrated.  You were cared for by a hospitalist during your hospital stay. If you have any questions about your discharge medications or the care you received while you were in the hospital after you are discharged, you can call the unit and asked to speak with the hospitalist on call if the hospitalist that took care of you is not available. Once you are discharged, your primary care physician will handle any further medical issues. Please note that NO REFILLS for any discharge medications will be authorized once you are discharged, as it is imperative that you return to your primary care physician (or establish a relationship with a primary care physician if you do not have one) for your aftercare needs so that they can reassess your need for medications and monitor your lab values. If you do not have a primary care physician, you can call [phone] for a physician referral.   Increase activity slowly   Complete by: As directed         Allergies as of 07/10/2020   No Known  Allergies     Medication List    TAKE these medications   allopurinol 100 MG tablet Commonly known as: ZYLOPRIM Take 300 mg by mouth daily.   ciprofloxacin 500 MG tablet Commonly known as: Cipro Take 1 tablet (500 mg total) by mouth 2 (two) times daily for 3 days.   lisinopril-hydrochlorothiazide 20-12.5 MG tablet Commonly known as: ZESTORETIC Take 2 tablets by mouth daily.   methadone 10 MG/5ML solution Commonly known as: DOLOPHINE Take 120 mg by mouth daily.   metroNIDAZOLE 500 MG tablet Commonly known as: Flagyl Take 1 tablet (500 mg total) by mouth 3 (three) times daily for 3 days.   multivitamin with minerals Tabs tablet Take 1 tablet by mouth daily.   PARoxetine 40 MG tablet Commonly known as: PAXIL Take 40 mg by mouth daily.         Follow-up Information    Vicenta Aly, [place]. Schedule an appointment as soon as possible for a visit in 1 week(s).   Specialty: Nurse Practitioner Contact information: [place] [address] [phone]               TOTAL DISCHARGE TIME: 63 minutes  [place]  [facility] Pager on [email or web address]  07/10/2020, 1:23 PM

## 2020-07-11 LAB — CULTURE, BLOOD (ROUTINE X 2)
Culture: NO GROWTH
Special Requests: ADEQUATE

## 2020-07-12 LAB — CULTURE, BLOOD (ROUTINE X 2)
Culture: NO GROWTH
Special Requests: ADEQUATE

## 2020-07-18 ENCOUNTER — Other Ambulatory Visit (HOSPITAL_COMMUNITY): Payer: Self-pay | Admitting: Adult Health

## 2020-07-18 ENCOUNTER — Other Ambulatory Visit: Payer: Self-pay | Admitting: Adult Health

## 2020-07-18 DIAGNOSIS — C641 Malignant neoplasm of right kidney, except renal pelvis: Secondary | ICD-10-CM

## 2020-07-21 ENCOUNTER — Ambulatory Visit (HOSPITAL_COMMUNITY)
Admission: RE | Admit: 2020-07-21 | Discharge: 2020-07-21 | Disposition: A | Discharge status: Home / Self Care | Payer: 59 | Source: Ambulatory Visit | Attending: Adult Health | Admitting: Adult Health

## 2020-07-21 ENCOUNTER — Other Ambulatory Visit: Payer: Self-pay

## 2020-07-21 DIAGNOSIS — C641 Malignant neoplasm of right kidney, except renal pelvis: Secondary | ICD-10-CM

## 2020-12-17 ENCOUNTER — Encounter (HOSPITAL_COMMUNITY): Payer: Self-pay

## 2020-12-17 ENCOUNTER — Other Ambulatory Visit: Payer: Self-pay

## 2020-12-17 ENCOUNTER — Emergency Department (HOSPITAL_COMMUNITY)
Admission: EM | Admit: 2020-12-17 | Discharge: 2020-12-17 | Disposition: A | Discharge status: Home / Self Care | Payer: 59 | Attending: Emergency Medicine | Admitting: Emergency Medicine

## 2020-12-17 DIAGNOSIS — Z85528 Personal history of other malignant neoplasm of kidney: Secondary | ICD-10-CM | POA: Insufficient documentation

## 2020-12-17 DIAGNOSIS — Z87891 Personal history of nicotine dependence: Secondary | ICD-10-CM | POA: Diagnosis not present

## 2020-12-17 DIAGNOSIS — L0291 Cutaneous abscess, unspecified: Secondary | ICD-10-CM | POA: Diagnosis not present

## 2020-12-17 DIAGNOSIS — Z79899 Other long term (current) drug therapy: Secondary | ICD-10-CM | POA: Diagnosis not present

## 2020-12-17 DIAGNOSIS — I10 Essential (primary) hypertension: Secondary | ICD-10-CM | POA: Diagnosis not present

## 2020-12-17 DIAGNOSIS — J449 Chronic obstructive pulmonary disease, unspecified: Secondary | ICD-10-CM | POA: Insufficient documentation

## 2020-12-17 DIAGNOSIS — L03113 Cellulitis of right upper limb: Secondary | ICD-10-CM

## 2020-12-17 DIAGNOSIS — M79631 Pain in right forearm: Secondary | ICD-10-CM | POA: Diagnosis present

## 2020-12-17 MED ORDER — CEPHALEXIN 500 MG PO CAPS
500.0000 mg | ORAL_CAPSULE | Freq: Once | ORAL | Status: DC
  Filled 2020-12-17: qty 1

## 2020-12-17 MED ORDER — SULFAMETHOXAZOLE-TRIMETHOPRIM 800-160 MG PO TABS
1.0000 | ORAL_TABLET | Freq: Once | ORAL | Status: DC
  Filled 2020-12-17: qty 1

## 2020-12-17 MED ORDER — SULFAMETHOXAZOLE-TRIMETHOPRIM 800-160 MG PO TABS: 1.0000 | ORAL_TABLET | Freq: Two times a day (BID) | ORAL | 0 refills | Status: AC

## 2020-12-17 MED ORDER — CEPHALEXIN 500 MG PO CAPS: 500.0000 mg | ORAL_CAPSULE | Freq: Four times a day (QID) | ORAL | 0 refills | Status: DC

## 2020-12-17 NOTE — ED Provider Notes (Signed)
  Face-to-face evaluation   History: Presents for evaluation of a nodule of his right forearm that he thinks is a spider bite, which has been swelling and painful for 2 days.  He decided to come here today because the redness is getting bigger.  He denies fever, chills, nausea or vomiting.  No prior similar problems.  There are no other known active modifying factors.  Physical exam: Alert elderly male.  Normal range of motion right arm.  Right mid forearm with 1/2 cm nodule, pointing, and surrounding erythema.  Medical screening examination/treatment/procedure(s) were conducted as a shared visit with non-physician practitioner(s) and myself.  I personally evaluated the patient during the encounter    Daleen Bo, MD 12/19/20 0004

## 2020-12-17 NOTE — ED Provider Notes (Signed)
[facility] DEPT Provider Note   CSN: [id number] Arrival date & time: 12/17/20  08/22/21     History Chief Complaint  Patient presents with   Insect Bite    Jonathan Wilson is a 70 y.o. male.  HPI     Jonathan Wilson is a 70 y.o. male, with a history of anxiety, hyperlipidemia, HTN, presenting to the ED with an area of pain and redness to the right forearm first noted 2 days ago.  Patient states he had an area of swelling that he thought was due to an insect bite surrounded by an area of redness. The central area became fluctuant and ruptured with purulent discharge yesterday. Denies immune compromise, HIV, IV drug use. He denies fever, joint pain, pain with movement of the arm, other wounds, or any other complaints.    Past Medical History:  Diagnosis Date   Anxiety    Anxiety and depression 23-Aug-1998   worse after death of wife March 24, 2013   Depression    Diverticulosis of colon 08-22-09   seen on CT scan in 2009-08-22.    Gout    Hepatitis C before 23-Aug-1998   Hyperlipidemia    Hypertension    Influenza A 06/2013   acute resp failure, did not require intubation.   Obesity    300 # in 2008/08/22, BMI 34 in 04/2013.    Pneumonia    recurrent episodes    Psychosis ([place]) Aug 23, 1998   s/e of treatment for Hepatitis C.     Renal cancer ([place])    Right lower lobe lung mass 05/2008   resolved on follow up CT 08-23-10.    Vitamin D deficiency     Patient Active Problem List   Diagnosis Date Noted   Diarrhea 07/06/2020   SIRS (systemic inflammatory response syndrome) ([place]) 07/06/2020   COPD exacerbation ([place]) 11/20/2016   AKI (acute kidney injury) ([place]) 11/20/2016   Hyponatremia 11/20/2016   Syncope 09/19/2015   History of renal cell cancer 08/07/2015   Bipolar disorder ([place]) 06/28/2015   Lumbago 05/24/2015   Thrombocytopenia ([place]) 04/29/2015   GAD (generalized anxiety disorder)    Substance induced mood disorder ([place])    Cholecystitis, acute 01/13/2014    Choledocholithiasis 01/12/2014   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   Essential hypertension    Hyperlipidemia    Vitamin D deficiency    PSYCHOSIS 07/17/2010   TOBACCO ABUSE 07/17/2010   COPD (chronic obstructive pulmonary disease) ([place]) 02/24/2009   GOUT 02/07/2009   Hepatitis C 02/07/2009   PERSONAL HISTORY PNEUMONIA RECURRENT 01/30/2009    Past Surgical History:  Procedure Laterality Date   CHOLECYSTECTOMY N/A 01/14/2014   Procedure: LAPAROSCOPIC CHOLECYSTECTOMY WITH INTRAOPERATIVE CHOLANGIOGRAM;  Surgeon: Shann Medal, MD;  Location: [facility];  Service: General;  Laterality: N/A;   MANDIBLE FRACTURE SURGERY     ROBOTIC ASSITED PARTIAL NEPHRECTOMY Right 08/07/2015   Procedure: XI ROBOTIC ASSISTED RIGHT PARTIAL NEPHRECTOMY;  Surgeon: Cleon Gustin, MD;  Location: [facility];  Service: Urology;  Laterality: Right;       Family History  Problem Relation Age of Onset   Diabetes Mother    Arthritis Mother    Cancer Father        pancreatic cancer    Social History   Tobacco Use   Smoking status: Former    Packs/day: 0.25    Years: 40.00    Pack years: 10.00    Types: Cigarettes   Smokeless tobacco: Never  Substance Use Topics   Alcohol use: No    Alcohol/week: 1.0 standard drink    Types: 1 Cans of beer per week    Comment: hx of etoh abuse- 12 years ago    Drug use: No    Types: Heroin    Comment: last use 12 years ago     Home Medications Prior to Admission medications   Medication Sig Start Date End Date Taking? Authorizing Provider  cephALEXin (KEFLEX) 500 MG capsule Take 1 capsule (500 mg total) by mouth 4 (four) times daily. 12/17/20  Yes Decarla Siemen C, PA-C  sulfamethoxazole-trimethoprim (BACTRIM DS) 800-160 MG tablet Take 1 tablet by mouth 2 (two) times daily for 7 days. 12/17/20 12/24/20 Yes Jesiah Grismer C, PA-C  allopurinol (ZYLOPRIM) 100 MG tablet Take 300 mg by mouth daily.    [provider]  lisinopril-hydrochlorothiazide  (PRINZIDE,ZESTORETIC) 20-12.5 MG tablet Take 2 tablets by mouth daily. 11/24/16   Thurnell Lose, MD  methadone (DOLOPHINE) 10 MG/5ML solution Take 120 mg by mouth daily.    [provider]  Multiple Vitamin (MULTIVITAMIN WITH MINERALS) TABS tablet Take 1 tablet by mouth daily.    [provider]  PARoxetine (PAXIL) 40 MG tablet Take 40 mg by mouth daily.    [provider]    Allergies    Patient has no known allergies.  Review of Systems   Review of Systems  Constitutional:  Negative for fever.  Skin:  Positive for color change and wound.  Neurological:  Negative for weakness and numbness.   Physical Exam Updated Vital Signs BP (!) 150/81 (BP Location: Right Arm)   Pulse 80   Temp 99.3 F (37.4 C) (Oral)   Resp 16   Ht 6\' 3"  (1.905 m)   Wt 108.9 kg   SpO2 95%   BMI 30.00 kg/m   Physical Exam Vitals and nursing note reviewed.  Constitutional:      General: He is not in acute distress.    Appearance: Normal appearance. He is well-developed. He is not diaphoretic.  HENT:     Head: Normocephalic and atraumatic.  Eyes:     Conjunctiva/sclera: Conjunctivae normal.  Cardiovascular:     Rate and Rhythm: Normal rate and regular rhythm.     Pulses:          Radial pulses are 2+ on the right side.  Pulmonary:     Effort: Pulmonary effort is normal.  Musculoskeletal:     Cervical back: Neck supple.     Comments: Small area of swelling surrounded by erythema to the right forearm, as shown in the photos.  Small amount of bloody discharge able to be expressed from the wound followed by a small amount of purulence.  No noted remaining fluctuance. Full range of motion in the right wrist and elbow without pain or noted difficulty. No additional tenderness, swelling, wounds, or areas of fluctuance.  Skin:    General: Skin is warm and dry.     Coloration: Skin is not pale.  Neurological:     Mental Status: He is alert.     Comments: Sensation grossly  intact to light touch through each of the nerve distributions of the bilateral upper extremities. Abduction and adduction of the fingers intact against resistance. Grip strength equal bilaterally. Supination and pronation intact against resistance. Strength 5/5 through the cardinal directions of the bilateral wrists. Strength 5/5 with flexion and extension of the bilateral elbows. Patient can touch the thumb to each one  of the fingertips without difficulty.  Patient can hold the "OK" sign against resistance.  Psychiatric:        Behavior: Behavior normal.        ED Results / Procedures / Treatments   Labs (all labs ordered are listed, but only abnormal results are displayed) Labs Reviewed - No data to display  EKG None  Radiology No results found.  Procedures Procedures   Medications Ordered in ED Medications  sulfamethoxazole-trimethoprim (BACTRIM DS) 800-160 MG per tablet 1 tablet (1 tablet Oral Not Given 12/17/20 2348)  cephALEXin (KEFLEX) capsule 500 mg (500 mg Oral Not Given 12/17/20 2347)    ED Course  I have reviewed the triage vital signs and the nursing notes.  Pertinent labs & imaging results that were available during my care of the patient were reviewed by me and considered in my medical decision making (see chart for details).    MDM Rules/Calculators/A&P                          Patient presents with an area of swelling and erythema. Apparent spontaneously ruptured abscess with surrounding cellulitis. Patient is nontoxic appearing, afebrile, not tachycardic, not tachypneic, not hypotensive, excellent SPO2 on room air, and is in no apparent distress.  Low suspicion for sepsis at this time. He has the ability for close follow-up with his PCP. The patient was given instructions for home care as well as return precautions. Patient voices understanding of these instructions, accepts the plan, and is comfortable with discharge.  Findings and plan of care  discussed with attending physician, Daleen Bo, MD. Dr. Eulis Foster personally evaluated and examined this patient.    Final Clinical Impression(s) / ED Diagnoses Final diagnoses:  Abscess  Cellulitis of right upper extremity    Rx / DC Orders ED Discharge Orders          Ordered    sulfamethoxazole-trimethoprim (BACTRIM DS) 800-160 MG tablet  2 times daily        12/17/20 2332    cephALEXin (KEFLEX) 500 MG capsule  4 times daily        12/17/20 2332             Lorayne Bender, PA-C 12/18/20 0141    Daleen Bo, MD 12/19/20 0005

## 2020-12-17 NOTE — ED Triage Notes (Signed)
Pt reports insect bite to right forearm that happened about 2-3 days ago. Pt reports redness and swelling has become worse. Pt states that he touched it earlier and it ruptured.

## 2020-12-17 NOTE — Discharge Instructions (Addendum)
Wound Care - Abscess  You may remove the bandage after 24 hours.  The only reason to replace the bandage is to protect clothing from drainage. Bandages, if used, should be replaced daily or whenever soiled. The wound may continue to drain for the next 2-3 days.   Cleaning: Clean the wound and surrounding area gently with tap water and mild soap. Rinse well and blot dry. You may shower normally. Soaking the wound in Epsom salt baths for no more than 15 minutes once a day may help rinse out any remaining pus and help with wound healing.  Clean the wound daily to prevent further infection. Do not use cleaners such as hydrogen peroxide or alcohol.   Scar reduction: Application of a topical antibiotic ointment, such as Neosporin, after the wound has begun to close and heal well can decrease scab formation and reduce scarring. After the wound has healed, application of ointments such as Aquaphor can also reduce scar formation.  The key to scar reduction is keeping the skin well hydrated and supple. Drinking plenty of water throughout the day (At least eight 8oz glasses of water a day) is essential to staying well hydrated.  Sun exposure: Keep the wound out of the sun. After the wound has healed, continue to protect it from the sun by wearing protective clothing or applying sunscreen.  Pain: You may use Tylenol, naproxen, or ibuprofen for pain.  Prevention: There are some people who have a predisposition to abscess formation, however, there are some things that can be done to prevent abscesses in many people.  Most abscesses form because bacteria that naturally lives on the skin gets trapped underneath the skin.  This can occur through openings too small to see. Before and after any area of skin is shaved, wax, or abraded in any manner, the area should be washed with soap and water and rinsed well.   If you are having trouble with recurrent abscesses, it may be wise to perform a chlorhexidine wash regimen.   For 1 week, wash all of your body with chlorhexidine (available over-the-counter at most pharmacies). You may also need to reevaluate your use of daily soap as soaps with perfumes or dyes can increase the chances of infection in some people.  Follow up: Please return to the ED or go to your primary care provider in 2-3 days for a wound check to assure proper healing.  Return: Return to the ED sooner should signs of worsening infection arise, such as spreading redness, worsening puffiness/swelling, severe increase in pain, fever over 100.77F, or any other major issues.  For prescription assistance, may try using prescription discount sites or apps, such as goodrx.com

## 2020-12-17 NOTE — ED Notes (Signed)
Pt was asked to go back into his room so that I could give him his medication. Pt states that "I am not a child and I'm not going back into my room." Pt left the facility.

## 2021-03-25 ENCOUNTER — Emergency Department (HOSPITAL_COMMUNITY)
Admission: EM | Admit: 2021-03-25 | Discharge: 2021-03-25 | Disposition: A | Discharge status: Home / Self Care | Payer: Medicare Other | Attending: Emergency Medicine | Admitting: Emergency Medicine

## 2021-03-25 ENCOUNTER — Other Ambulatory Visit: Payer: Self-pay

## 2021-03-25 ENCOUNTER — Encounter (HOSPITAL_COMMUNITY): Payer: Self-pay

## 2021-03-25 DIAGNOSIS — J449 Chronic obstructive pulmonary disease, unspecified: Secondary | ICD-10-CM | POA: Diagnosis not present

## 2021-03-25 DIAGNOSIS — Z87891 Personal history of nicotine dependence: Secondary | ICD-10-CM | POA: Insufficient documentation

## 2021-03-25 DIAGNOSIS — H60502 Unspecified acute noninfective otitis externa, left ear: Secondary | ICD-10-CM

## 2021-03-25 DIAGNOSIS — I10 Essential (primary) hypertension: Secondary | ICD-10-CM | POA: Diagnosis not present

## 2021-03-25 DIAGNOSIS — Z79899 Other long term (current) drug therapy: Secondary | ICD-10-CM | POA: Insufficient documentation

## 2021-03-25 DIAGNOSIS — Z85520 Personal history of malignant carcinoid tumor of kidney: Secondary | ICD-10-CM | POA: Insufficient documentation

## 2021-03-25 DIAGNOSIS — H9202 Otalgia, left ear: Secondary | ICD-10-CM | POA: Diagnosis present

## 2021-03-25 MED ORDER — CIPROFLOXACIN-DEXAMETHASONE 0.3-0.1 % OT SUSP: 4.0000 [drp] | Freq: Two times a day (BID) | OTIC | 0 refills | Status: DC

## 2021-03-25 MED ORDER — HYDROCODONE-ACETAMINOPHEN 5-325 MG PO TABS
1.0000 | ORAL_TABLET | Freq: Once | ORAL | Status: AC
  Administered 2021-03-25: 1 via ORAL
  Filled 2021-03-25: qty 1

## 2021-03-25 NOTE — ED Triage Notes (Signed)
Pt to ED by POV from home with c/o L ear pain of unknown origin, described as sharp. No Hx of the same. A+O, VSS, NADN.

## 2021-03-25 NOTE — ED Provider Notes (Signed)
[facility] DEPT Provider Note   CSN: [id number] Arrival date & time: 03/25/21  0146     History Chief Complaint  Patient presents with   Otalgia    Jonathan Wilson is a 70 y.o. male.  70 year old male presents with complaint of pain in left ear onset yesterday without history of injury.  Denies fever, recent URI, trauma to the ear, drainage.  Patient is edentulous, no other complaints or concerns.      Past Medical History:  Diagnosis Date   Anxiety    Anxiety and depression Sep 02, 1998   worse after death of wife 04-03-2013   Depression    Diverticulosis of colon 09-01-09   seen on CT scan in 09-01-2009.    Gout    Hepatitis C before 09/02/1998   Hyperlipidemia    Hypertension    Influenza A 06/2013   acute resp failure, did not require intubation.   Obesity    300 # in 09/01/2008, BMI 34 in 04/2013.    Pneumonia    recurrent episodes    Psychosis ([place]) 09/02/98   s/e of treatment for Hepatitis C.     Renal cancer ([place])    Right lower lobe lung mass 05/2008   resolved on follow up CT 09-02-10.    Vitamin D deficiency     Patient Active Problem List   Diagnosis Date Noted   Diarrhea 07/06/2020   SIRS (systemic inflammatory response syndrome) ([place]) 07/06/2020   COPD exacerbation ([place]) 11/20/2016   AKI (acute kidney injury) ([place]) 11/20/2016   Hyponatremia 11/20/2016   Syncope 09/19/2015   History of renal cell cancer 08/07/2015   Bipolar disorder ([place]) 06/28/2015   Lumbago 05/24/2015   Thrombocytopenia ([place]) 04/29/2015   GAD (generalized anxiety disorder)    Substance induced mood disorder ([place])    Cholecystitis, acute 01/13/2014   Choledocholithiasis 01/12/2014   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   Essential hypertension    Hyperlipidemia    Vitamin D deficiency    PSYCHOSIS 07/17/2010   TOBACCO ABUSE 07/17/2010   COPD (chronic obstructive pulmonary disease) ([place]) 02/24/2009   GOUT 02/07/2009   Hepatitis C 02/07/2009   PERSONAL HISTORY  PNEUMONIA RECURRENT 01/30/2009    Past Surgical History:  Procedure Laterality Date   CHOLECYSTECTOMY N/A 01/14/2014   Procedure: LAPAROSCOPIC CHOLECYSTECTOMY WITH INTRAOPERATIVE CHOLANGIOGRAM;  Surgeon: Shann Medal, MD;  Location: [facility];  Service: General;  Laterality: N/A;   MANDIBLE FRACTURE SURGERY     ROBOTIC ASSITED PARTIAL NEPHRECTOMY Right 08/07/2015   Procedure: XI ROBOTIC ASSISTED RIGHT PARTIAL NEPHRECTOMY;  Surgeon: Cleon Gustin, MD;  Location: [facility];  Service: Urology;  Laterality: Right;       Family History  Problem Relation Age of Onset   Diabetes Mother    Arthritis Mother    Cancer Father        pancreatic cancer    Social History   Tobacco Use   Smoking status: Former    Packs/day: 0.25    Years: 40.00    Pack years: 10.00    Types: Cigarettes   Smokeless tobacco: Never  Substance Use Topics   Alcohol use: No    Alcohol/week: 1.0 standard drink    Types: 1 Cans of beer per week    Comment: hx of etoh abuse- 12 years ago    Drug use: No    Types: Heroin    Comment: last use 12 years ago     Home Medications Prior  to Admission medications   Medication Sig Start Date End Date Taking? Authorizing Provider  ciprofloxacin-dexamethasone (CIPRODEX) OTIC suspension Place 4 drops into the left ear 2 (two) times daily. 03/25/21  Yes Tacy Learn, PA-C  allopurinol (ZYLOPRIM) 100 MG tablet Take 300 mg by mouth daily.    [provider]  cephALEXin (KEFLEX) 500 MG capsule Take 1 capsule (500 mg total) by mouth 4 (four) times daily. 12/17/20   Joy, Shawn C, PA-C  lisinopril-hydrochlorothiazide (PRINZIDE,ZESTORETIC) 20-12.5 MG tablet Take 2 tablets by mouth daily. 11/24/16   Thurnell Lose, MD  methadone (DOLOPHINE) 10 MG/5ML solution Take 120 mg by mouth daily.    [provider]  Multiple Vitamin (MULTIVITAMIN WITH MINERALS) TABS tablet Take 1 tablet by mouth daily.    [provider]  PARoxetine (PAXIL) 40 MG tablet Take  40 mg by mouth daily.    [provider]    Allergies    Patient has no known allergies.  Review of Systems   Review of Systems  Constitutional:  Negative for chills and fever.  HENT:  Positive for ear discharge. Negative for congestion, dental problem, ear pain, facial swelling and hearing loss.   Respiratory:  Negative for cough.   Musculoskeletal:  Negative for neck pain and neck stiffness.  Skin:  Negative for rash and wound.  Allergic/Immunologic: Negative for immunocompromised state.  Neurological:  Negative for headaches.  Hematological:  Negative for adenopathy.   Physical Exam Updated Vital Signs BP (!) 150/88 (BP Location: Left Arm)   Pulse 80   Temp 99 F (37.2 C) (Oral)   Resp 16   SpO2 95%   Physical Exam Vitals and nursing note reviewed.  Constitutional:      General: He is not in acute distress.    Appearance: He is well-developed. He is not diaphoretic.  HENT:     Head: Normocephalic and atraumatic.     Comments: Pain with palpation of the tragus as well as canal tenderness. No lesions, no mastoid tenderness, no drainage.    Right Ear: Tympanic membrane and ear canal normal. There is no impacted cerumen.     Left Ear: Tympanic membrane normal. There is no impacted cerumen.     Mouth/Throat:     Mouth: Mucous membranes are moist.  Eyes:     Conjunctiva/sclera: Conjunctivae normal.  Pulmonary:     Effort: Pulmonary effort is normal.  Musculoskeletal:     Cervical back: Neck supple. No tenderness.  Skin:    General: Skin is warm and dry.     Findings: No erythema, lesion or rash.  Neurological:     Mental Status: He is alert and oriented to person, place, and time.  Psychiatric:        Behavior: Behavior normal.    ED Results / Procedures / Treatments   Labs (all labs ordered are listed, but only abnormal results are displayed) Labs Reviewed - No data to display  EKG None  Radiology No results found.  Procedures Procedures    Medications Ordered in ED Medications  HYDROcodone-acetaminophen (NORCO/VICODIN) 5-325 MG per tablet 1 tablet (has no administration in time range)    ED Course  I have reviewed the triage vital signs and the nursing notes.  Pertinent labs & imaging results that were available during my care of the patient were reviewed by me and considered in my medical decision making (see chart for details).  Clinical Course as of 03/25/21 9892  Nancy Fetter Mar 25, 2021  [id number] 70 year old male with complaint of left ear pain, found to have pain with palpation of tragus with painful canal. No lesions or evidence of shingles. No pain at TMJ, edentulous, upper dentures in without complaint. No pain over mastoid. Given norco for pain tonight, rx for ciprodex, recommend recheck with PCP Monday if not improving.  [LM]    Clinical Course User Index [LM] Roque Lias   MDM Rules/Calculators/A&P                           Final Clinical Impression(s) / ED Diagnoses Final diagnoses:  Acute otitis externa of left ear, unspecified type    Rx / DC Orders ED Discharge Orders          Ordered    ciprofloxacin-dexamethasone (CIPRODEX) OTIC suspension  2 times daily        03/25/21 0333             Tacy Learn, PA-C 33/38/32 9191    Delora Fuel, MD 66/06/00 [phone]

## 2021-03-25 NOTE — Discharge Instructions (Addendum)
Recheck with your doctor on Monday if not improving. Apply warm compresses to your ear for 20 minutes at a time. Apply eardrops as prescribed. Take Motrin and Tylenol as needed as directed for pain.

## 2021-05-12 ENCOUNTER — Other Ambulatory Visit: Payer: Self-pay | Admitting: Nurse Practitioner

## 2021-05-12 DIAGNOSIS — K7469 Other cirrhosis of liver: Secondary | ICD-10-CM

## 2021-06-05 ENCOUNTER — Other Ambulatory Visit: Payer: Medicaid Other

## 2021-06-19 ENCOUNTER — Inpatient Hospital Stay: Admission: RE | Admit: 2021-06-19 | Payer: Medicaid Other | Source: Ambulatory Visit

## 2021-06-29 ENCOUNTER — Other Ambulatory Visit: Payer: Medicaid Other

## 2021-07-09 ENCOUNTER — Other Ambulatory Visit: Payer: Medicaid Other

## 2021-07-18 ENCOUNTER — Ambulatory Visit
Admission: RE | Admit: 2021-07-18 | Discharge: 2021-07-18 | Disposition: A | Discharge status: Home / Self Care | Payer: Medicare Other | Source: Ambulatory Visit | Attending: Nurse Practitioner | Admitting: Nurse Practitioner

## 2021-07-18 DIAGNOSIS — K7469 Other cirrhosis of liver: Secondary | ICD-10-CM

## 2021-07-30 ENCOUNTER — Emergency Department (HOSPITAL_COMMUNITY): Payer: Medicare Other

## 2021-07-30 ENCOUNTER — Inpatient Hospital Stay (HOSPITAL_COMMUNITY)
Admission: EM | Admit: 2021-07-30 | Discharge: 2021-08-01 | DRG: 682 | Disposition: A | Discharge status: Home / Self Care | Payer: Medicare Other | Attending: Family Medicine | Admitting: Family Medicine

## 2021-07-30 ENCOUNTER — Other Ambulatory Visit: Payer: Self-pay

## 2021-07-30 ENCOUNTER — Encounter (HOSPITAL_COMMUNITY): Payer: Self-pay

## 2021-07-30 DIAGNOSIS — K7030 Alcoholic cirrhosis of liver without ascites: Secondary | ICD-10-CM | POA: Diagnosis present

## 2021-07-30 DIAGNOSIS — Z79891 Long term (current) use of opiate analgesic: Secondary | ICD-10-CM | POA: Diagnosis not present

## 2021-07-30 DIAGNOSIS — Z85528 Personal history of other malignant neoplasm of kidney: Secondary | ICD-10-CM | POA: Diagnosis not present

## 2021-07-30 DIAGNOSIS — J441 Chronic obstructive pulmonary disease with (acute) exacerbation: Secondary | ICD-10-CM | POA: Diagnosis present

## 2021-07-30 DIAGNOSIS — Z8261 Family history of arthritis: Secondary | ICD-10-CM

## 2021-07-30 DIAGNOSIS — F32A Depression, unspecified: Secondary | ICD-10-CM | POA: Diagnosis present

## 2021-07-30 DIAGNOSIS — Z79899 Other long term (current) drug therapy: Secondary | ICD-10-CM

## 2021-07-30 DIAGNOSIS — E785 Hyperlipidemia, unspecified: Secondary | ICD-10-CM | POA: Diagnosis present

## 2021-07-30 DIAGNOSIS — K529 Noninfective gastroenteritis and colitis, unspecified: Secondary | ICD-10-CM | POA: Diagnosis not present

## 2021-07-30 DIAGNOSIS — F411 Generalized anxiety disorder: Secondary | ICD-10-CM | POA: Diagnosis present

## 2021-07-30 DIAGNOSIS — F419 Anxiety disorder, unspecified: Secondary | ICD-10-CM | POA: Diagnosis present

## 2021-07-30 DIAGNOSIS — I10 Essential (primary) hypertension: Secondary | ICD-10-CM | POA: Diagnosis present

## 2021-07-30 DIAGNOSIS — N179 Acute kidney failure, unspecified: Principal | ICD-10-CM | POA: Diagnosis present

## 2021-07-30 DIAGNOSIS — I700 Atherosclerosis of aorta: Secondary | ICD-10-CM | POA: Diagnosis present

## 2021-07-30 DIAGNOSIS — E860 Dehydration: Secondary | ICD-10-CM | POA: Diagnosis present

## 2021-07-30 DIAGNOSIS — E669 Obesity, unspecified: Secondary | ICD-10-CM | POA: Diagnosis present

## 2021-07-30 DIAGNOSIS — R571 Hypovolemic shock: Secondary | ICD-10-CM

## 2021-07-30 DIAGNOSIS — J449 Chronic obstructive pulmonary disease, unspecified: Secondary | ICD-10-CM | POA: Diagnosis present

## 2021-07-30 DIAGNOSIS — Z9049 Acquired absence of other specified parts of digestive tract: Secondary | ICD-10-CM

## 2021-07-30 DIAGNOSIS — E861 Hypovolemia: Secondary | ICD-10-CM | POA: Diagnosis present

## 2021-07-30 DIAGNOSIS — Z20822 Contact with and (suspected) exposure to covid-19: Secondary | ICD-10-CM | POA: Diagnosis present

## 2021-07-30 DIAGNOSIS — Z8619 Personal history of other infectious and parasitic diseases: Secondary | ICD-10-CM

## 2021-07-30 DIAGNOSIS — J42 Unspecified chronic bronchitis: Secondary | ICD-10-CM | POA: Diagnosis not present

## 2021-07-30 DIAGNOSIS — R5381 Other malaise: Secondary | ICD-10-CM | POA: Diagnosis not present

## 2021-07-30 DIAGNOSIS — Z905 Acquired absence of kidney: Secondary | ICD-10-CM

## 2021-07-30 DIAGNOSIS — I959 Hypotension, unspecified: Secondary | ICD-10-CM | POA: Diagnosis present

## 2021-07-30 DIAGNOSIS — J9602 Acute respiratory failure with hypercapnia: Secondary | ICD-10-CM

## 2021-07-30 DIAGNOSIS — F17210 Nicotine dependence, cigarettes, uncomplicated: Secondary | ICD-10-CM | POA: Diagnosis present

## 2021-07-30 DIAGNOSIS — I714 Abdominal aortic aneurysm, without rupture, unspecified: Secondary | ICD-10-CM | POA: Diagnosis not present

## 2021-07-30 DIAGNOSIS — R579 Shock, unspecified: Secondary | ICD-10-CM

## 2021-07-30 DIAGNOSIS — F1120 Opioid dependence, uncomplicated: Secondary | ICD-10-CM | POA: Diagnosis present

## 2021-07-30 DIAGNOSIS — M109 Gout, unspecified: Secondary | ICD-10-CM | POA: Diagnosis present

## 2021-07-30 DIAGNOSIS — Z833 Family history of diabetes mellitus: Secondary | ICD-10-CM

## 2021-07-30 DIAGNOSIS — I7143 Infrarenal abdominal aortic aneurysm, without rupture: Secondary | ICD-10-CM | POA: Diagnosis present

## 2021-07-30 DIAGNOSIS — R112 Nausea with vomiting, unspecified: Secondary | ICD-10-CM | POA: Diagnosis present

## 2021-07-30 DIAGNOSIS — J9621 Acute and chronic respiratory failure with hypoxia: Secondary | ICD-10-CM | POA: Diagnosis present

## 2021-07-30 DIAGNOSIS — Z800 Family history of malignant neoplasm of digestive organs: Secondary | ICD-10-CM

## 2021-07-30 DIAGNOSIS — J9622 Acute and chronic respiratory failure with hypercapnia: Secondary | ICD-10-CM | POA: Diagnosis present

## 2021-07-30 DIAGNOSIS — Z8701 Personal history of pneumonia (recurrent): Secondary | ICD-10-CM

## 2021-07-30 DIAGNOSIS — E875 Hyperkalemia: Secondary | ICD-10-CM

## 2021-07-30 DIAGNOSIS — E872 Acidosis, unspecified: Secondary | ICD-10-CM | POA: Diagnosis present

## 2021-07-30 LAB — CBC WITH DIFFERENTIAL/PLATELET
Abs Immature Granulocytes: 0.04 10*3/uL (ref 0.00–0.07)
Basophils Absolute: 0 10*3/uL (ref 0.0–0.1)
Basophils Relative: 0 %
Eosinophils Absolute: 0.1 10*3/uL (ref 0.0–0.5)
Eosinophils Relative: 1 %
HCT: 45.7 % (ref 39.0–52.0)
Hemoglobin: 14.3 g/dL (ref 13.0–17.0)
Immature Granulocytes: 0 %
Lymphocytes Relative: 13 %
Lymphs Abs: 1.4 10*3/uL (ref 0.7–4.0)
MCH: 29.9 pg (ref 26.0–34.0)
MCHC: 31.3 g/dL (ref 30.0–36.0)
MCV: 95.4 fL (ref 80.0–100.0)
Monocytes Absolute: 1.1 10*3/uL — ABNORMAL HIGH (ref 0.1–1.0)
Monocytes Relative: 10 %
Neutro Abs: 8.3 10*3/uL — ABNORMAL HIGH (ref 1.7–7.7)
Neutrophils Relative %: 76 %
Platelets: 254 10*3/uL (ref 150–400)
RBC: 4.79 MIL/uL (ref 4.22–5.81)
RDW: 13.8 % (ref 11.5–15.5)
WBC: 10.9 10*3/uL — ABNORMAL HIGH (ref 4.0–10.5)
nRBC: 0 % (ref 0.0–0.2)

## 2021-07-30 LAB — COMPREHENSIVE METABOLIC PANEL
ALT: 18 U/L (ref 0–44)
AST: 23 U/L (ref 15–41)
Albumin: 4.3 g/dL (ref 3.5–5.0)
Alkaline Phosphatase: 91 U/L (ref 38–126)
Anion gap: 10 (ref 5–15)
BUN: 42 mg/dL — ABNORMAL HIGH (ref 8–23)
CO2: 31 mmol/L (ref 22–32)
Calcium: 9.7 mg/dL (ref 8.9–10.3)
Chloride: 97 mmol/L — ABNORMAL LOW (ref 98–111)
Creatinine, Ser: 3.03 mg/dL — ABNORMAL HIGH (ref 0.61–1.24)
GFR, Estimated: 22 mL/min — ABNORMAL LOW (ref >60)
Glucose, Bld: 154 mg/dL — ABNORMAL HIGH (ref 70–99)
Potassium: 4.4 mmol/L (ref 3.5–5.1)
Sodium: 138 mmol/L (ref 135–145)
Total Bilirubin: 0.6 mg/dL (ref 0.3–1.2)
Total Protein: 8 g/dL (ref 6.5–8.1)

## 2021-07-30 LAB — PROTIME-INR
INR: 1.1 (ref 0.8–1.2)
Prothrombin Time: 13.7 seconds (ref 11.4–15.2)

## 2021-07-30 LAB — BLOOD GAS, VENOUS
Acid-Base Excess: 2.9 mmol/L — ABNORMAL HIGH (ref 0.0–2.0)
Acid-Base Excess: 1.5 mmol/L (ref 0.0–2.0)
Bicarbonate: 34.4 mmol/L — ABNORMAL HIGH (ref 20.0–28.0)
Bicarbonate: 28.7 mmol/L — ABNORMAL HIGH (ref 20.0–28.0)
O2 Saturation: 12.5 %
O2 Saturation: 53.9 %
Patient temperature: 37
Patient temperature: 37
pCO2, Ven: 90 mmHg (ref 44–60)
pCO2, Ven: 57 mmHg (ref 44–60)
pH, Ven: 7.19 — CL (ref 7.25–7.43)
pH, Ven: 7.31 (ref 7.25–7.43)
pO2, Ven: <31 mmHg — CL (ref 32–45)
pO2, Ven: 34 mmHg (ref 32–45)

## 2021-07-30 LAB — BASIC METABOLIC PANEL
Anion gap: 5 (ref 5–15)
BUN: 43 mg/dL — ABNORMAL HIGH (ref 8–23)
CO2: 29 mmol/L (ref 22–32)
Calcium: 8.1 mg/dL — ABNORMAL LOW (ref 8.9–10.3)
Chloride: 102 mmol/L (ref 98–111)
Creatinine, Ser: 2.72 mg/dL — ABNORMAL HIGH (ref 0.61–1.24)
GFR, Estimated: 25 mL/min — ABNORMAL LOW (ref >60)
Glucose, Bld: 131 mg/dL — ABNORMAL HIGH (ref 70–99)
Potassium: 4.4 mmol/L (ref 3.5–5.1)
Sodium: 136 mmol/L (ref 135–145)

## 2021-07-30 LAB — LIPASE, BLOOD: Lipase: 36 U/L (ref 11–51)

## 2021-07-30 LAB — MRSA NEXT GEN BY PCR, NASAL: MRSA by PCR Next Gen: DETECTED — AB

## 2021-07-30 LAB — RESP PANEL BY RT-PCR (FLU A&B, COVID) ARPGX2
Influenza A by PCR: NEGATIVE
Influenza B by PCR: NEGATIVE
SARS Coronavirus 2 by RT PCR: NEGATIVE

## 2021-07-30 LAB — LACTIC ACID, PLASMA
Lactic Acid, Venous: 1.6 mmol/L (ref 0.5–1.9)
Lactic Acid, Venous: 2.2 mmol/L (ref 0.5–1.9)
Lactic Acid, Venous: 1.9 mmol/L (ref 0.5–1.9)

## 2021-07-30 LAB — MAGNESIUM: Magnesium: 2.6 mg/dL — ABNORMAL HIGH (ref 1.7–2.4)

## 2021-07-30 LAB — AMMONIA: Ammonia: <10 umol/L (ref 9–35)

## 2021-07-30 MED ORDER — ONDANSETRON HCL 4 MG/2ML IJ SOLN
4.0000 mg | Freq: Once | INTRAMUSCULAR | Status: AC
  Administered 2021-07-30: 4 mg via INTRAVENOUS
  Filled 2021-07-30: qty 2

## 2021-07-30 MED ORDER — MUPIROCIN 2 % EX OINT
1.0000 "application " | TOPICAL_OINTMENT | Freq: Two times a day (BID) | CUTANEOUS | Status: DC
  Administered 2021-07-30 – 2021-08-01 (×4): 1 via NASAL
  Filled 2021-07-30: qty 22

## 2021-07-30 MED ORDER — PAROXETINE HCL 20 MG PO TABS
40.0000 mg | ORAL_TABLET | Freq: Every day | ORAL | Status: DC
  Administered 2021-07-30 – 2021-08-01 (×3): 40 mg via ORAL
  Filled 2021-07-30 (×3): qty 2

## 2021-07-30 MED ORDER — CHLORHEXIDINE GLUCONATE 0.12 % MT SOLN
15.0000 mL | Freq: Two times a day (BID) | OROMUCOSAL | Status: DC
  Administered 2021-07-30 – 2021-08-01 (×4): 15 mL via OROMUCOSAL
  Filled 2021-07-30 (×4): qty 15

## 2021-07-30 MED ORDER — ACETAMINOPHEN 325 MG PO TABS
650.0000 mg | ORAL_TABLET | Freq: Four times a day (QID) | ORAL | Status: DC | PRN
  Administered 2021-07-31 – 2021-08-01 (×2): 650 mg via ORAL
  Filled 2021-07-30 (×2): qty 2

## 2021-07-30 MED ORDER — PIPERACILLIN-TAZOBACTAM 3.375 G IVPB 30 MIN
3.3750 g | Freq: Once | INTRAVENOUS | Status: AC
  Administered 2021-07-30: 3.375 g via INTRAVENOUS
  Filled 2021-07-30: qty 50

## 2021-07-30 MED ORDER — LACTATED RINGERS IV SOLN: INTRAVENOUS | Status: DC

## 2021-07-30 MED ORDER — ORAL CARE MOUTH RINSE
15.0000 mL | Freq: Two times a day (BID) | OROMUCOSAL | Status: DC
  Administered 2021-07-30 – 2021-07-31 (×3): 15 mL via OROMUCOSAL

## 2021-07-30 MED ORDER — LACTATED RINGERS IV BOLUS
2000.0000 mL | Freq: Once | INTRAVENOUS | Status: AC
  Administered 2021-07-30: 2000 mL via INTRAVENOUS

## 2021-07-30 MED ORDER — CHLORHEXIDINE GLUCONATE CLOTH 2 % EX PADS
6.0000 | MEDICATED_PAD | Freq: Every day | CUTANEOUS | Status: DC
  Administered 2021-07-31 – 2021-08-01 (×2): 6 via TOPICAL

## 2021-07-30 MED ORDER — ONDANSETRON HCL 4 MG/2ML IJ SOLN: 4.0000 mg | Freq: Four times a day (QID) | INTRAMUSCULAR | Status: DC | PRN

## 2021-07-30 MED ORDER — PANTOPRAZOLE SODIUM 40 MG IV SOLR
40.0000 mg | INTRAVENOUS | Status: DC
  Administered 2021-07-30 – 2021-08-01 (×3): 40 mg via INTRAVENOUS
  Filled 2021-07-30 (×3): qty 10

## 2021-07-30 MED ORDER — ONDANSETRON HCL 4 MG PO TABS: 4.0000 mg | ORAL_TABLET | Freq: Four times a day (QID) | ORAL | Status: DC | PRN

## 2021-07-30 MED ORDER — METRONIDAZOLE 500 MG/100ML IV SOLN
500.0000 mg | Freq: Two times a day (BID) | INTRAVENOUS | Status: DC
  Administered 2021-07-30 – 2021-07-31 (×3): 500 mg via INTRAVENOUS
  Filled 2021-07-30 (×3): qty 100

## 2021-07-30 MED ORDER — NOREPINEPHRINE 4 MG/250ML-% IV SOLN
0.0000 ug/min | INTRAVENOUS | Status: DC
  Filled 2021-07-30: qty 250

## 2021-07-30 MED ORDER — METHYLPREDNISOLONE SODIUM SUCC 40 MG IJ SOLR
40.0000 mg | Freq: Once | INTRAMUSCULAR | Status: AC
Start: 2021-07-30 — End: 2021-07-30
  Administered 2021-07-30: 40 mg via INTRAVENOUS
  Filled 2021-07-30: qty 1

## 2021-07-30 MED ORDER — IPRATROPIUM-ALBUTEROL 0.5-2.5 (3) MG/3ML IN SOLN: 3.0000 mL | RESPIRATORY_TRACT | Status: DC | PRN

## 2021-07-30 MED ORDER — CLONAZEPAM 0.5 MG PO TABS
0.5000 mg | ORAL_TABLET | Freq: Every day | ORAL | Status: DC
  Administered 2021-07-30 – 2021-07-31 (×2): 0.5 mg via ORAL
  Filled 2021-07-30 (×2): qty 1

## 2021-07-30 MED ORDER — METHADONE HCL 10 MG/5ML PO SOLN: 60.0000 mg | Freq: Two times a day (BID) | ORAL | Status: DC

## 2021-07-30 MED ORDER — SODIUM CHLORIDE 0.9 % IV BOLUS
1000.0000 mL | Freq: Once | INTRAVENOUS | Status: AC
  Administered 2021-07-30: 1000 mL via INTRAVENOUS

## 2021-07-30 MED ORDER — ALBUTEROL SULFATE (2.5 MG/3ML) 0.083% IN NEBU
10.0000 mg/h | INHALATION_SOLUTION | Freq: Once | RESPIRATORY_TRACT | Status: AC
Start: 2021-07-30 — End: 2021-07-30
  Administered 2021-07-30: 10 mg/h via RESPIRATORY_TRACT
  Filled 2021-07-30: qty 3

## 2021-07-30 MED ORDER — LACTATED RINGERS IV BOLUS
1000.0000 mL | Freq: Once | INTRAVENOUS | Status: AC
  Administered 2021-07-30: 1000 mL via INTRAVENOUS

## 2021-07-30 MED ORDER — ACETAMINOPHEN 650 MG RE SUPP: 650.0000 mg | Freq: Four times a day (QID) | RECTAL | Status: DC | PRN

## 2021-07-30 MED ORDER — SODIUM CHLORIDE 0.9 % IV BOLUS: 1000.0000 mL | Freq: Once | INTRAVENOUS | Status: DC

## 2021-07-30 MED ORDER — SODIUM CHLORIDE 0.9 % IV SOLN
2.0000 g | INTRAVENOUS | Status: DC
  Administered 2021-07-30: 2 g via INTRAVENOUS
  Filled 2021-07-30 (×2): qty 20

## 2021-07-30 MED ORDER — METHADONE HCL 10 MG/ML PO CONC
20.0000 mg | Freq: Two times a day (BID) | ORAL | Status: DC
  Administered 2021-07-30 – 2021-08-01 (×4): 20 mg via ORAL
  Filled 2021-07-30 (×4): qty 5

## 2021-07-30 NOTE — ED Notes (Addendum)
Attempted [place] IV with no success. Pt jerking arm back when stuck. Unable to remain still.

## 2021-07-30 NOTE — Sepsis Progress Note (Signed)
Notified provider need for 3rd lactic acid per protocol via chat. Indicated "seen" Thank you

## 2021-07-30 NOTE — Progress Notes (Signed)
RT evaluated patient for BIPAP. Patient is nauseated and has been vomiting prior to ER visit. MD ordered nausea meds and Bipap. RT holding Bipap for contraindication of patient being nauseated. MD and RN aware.

## 2021-07-30 NOTE — Sepsis Progress Note (Signed)
Elink following for Sepsis Protocol 

## 2021-07-30 NOTE — ED Provider Notes (Signed)
Blood pressure is stabilized with additional IV fluids.  Has not needed Levophed.  Critical care team has assessed the patient.  He has been removed from BiPAP.  They will follow along.  They are recommending admission to stepdown unit.  Will admit to medicine team.  This chart was dictated using voice recognition software.  Despite best efforts to proofread,  errors can occur which can change the documentation meaning.    Lennice Sites, DO 07/30/21 [phone]

## 2021-07-30 NOTE — H&P (Signed)
History and Physical    Patient: Jonathan Wilson RSW:[id number] DOB: Oct 29, 1950 DOA: 07/30/2021 DOS: the patient was seen and examined on 07/30/2021 PCP: Vicenta Aly, [place]  Patient coming from: Home  Chief Complaint:  Chief Complaint  Patient presents with   Shortness of Breath    HPI: Jonathan Wilson is a 71 y.o. 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 intake for several days.  Similar symptoms have also affected several his family members.  He has been urinating less than usual.  He denied flank pain, melena, hematochezia, dysuria, frequency or hematuria.  No chest pain, palpitations, diaphoresis, PND, orthopnea or pitting edema of the lower extremities.  ED course: Initial vital signs were temperature 97.8 F, pulse 69, respiration 14, BP 88/68 mmHg and O2 sat 97%On aerosol mask.  There was a consideration for using BiPAP ventilation mode but the patient improved with a 10 mg continuous albuterol nebulizer treatment.  He has also received 3000 mL of NS/LR bolus.  I added another 1000 mL of LR.  Other medications used were 3.375 g, ondansetron 4 mg IVP.  I added Solu-Medrol 40 mg IVP x1 dose.  Lab work: CBC showed a white count of 10.9, hemoglobin 14.3 g/dL platelets 254.  PT  13.7 and INR 1.1.  Ammonia level was normal.  Lactic acid was 1.6 and then 2.2 mmol/L.  Influenza and coronavirus PCR were negative.  Lipase was  normal.  CMP showed a chloride of 97 mmol/L.  The rest of the electrolytes and anion gap were normal.   Glucose 154, BUN 42 and creatinine 3.03 mg/dL. Venous blood gas showed a pH of 7.19, PCO2 of 90 with a PO2 less than 31 mmHg.  Bicarbonate was 34.4 and acid base excess 2.9 mmol/L.  LFTs were normal.  Imaging: CT abdomen/pelvis without contrast showed diffuse fluid level and bowel likely reflecting diarrheal illness/enteritis.  There was no bowel wall thickening or obstruction.  There was infrarenal AAA measuring up to 3.7 cm.  Follow-up recommended in 2 years.  There was aortic atherosclerosis.  Review of Systems: As mentioned in the history of present illness. All other systems reviewed and are negative. Past Medical History:  Diagnosis Date   Anxiety    Anxiety and depression 09-11-1998   worse after death of wife 04/12/13   Depression    Diverticulosis of colon 2009/09/10   seen on CT scan in 10-Sep-2009.    Gout    Hepatitis C before 09/11/1998   Hyperlipidemia    Hypertension    Influenza A 06/2013   acute resp failure, did not require intubation.   Obesity    300 # in 2008-09-10, BMI 34 in 04/2013.    Pneumonia    recurrent episodes    Psychosis ([place]) 11-Sep-1998   s/e of treatment for Hepatitis C.     Renal cancer ([place])    Right lower lobe lung mass 05/2008   resolved on follow up CT 2010-09-11.    Vitamin D deficiency    Past Surgical History:  Procedure Laterality Date   CHOLECYSTECTOMY N/A 01/14/2014   Procedure: LAPAROSCOPIC CHOLECYSTECTOMY WITH INTRAOPERATIVE CHOLANGIOGRAM;  Surgeon: Shann Medal, MD;  Location: [facility];  Service: General;  Laterality: N/A;   MANDIBLE FRACTURE SURGERY     ROBOTIC ASSITED PARTIAL NEPHRECTOMY Right 08/07/2015   Procedure: XI ROBOTIC ASSISTED RIGHT PARTIAL NEPHRECTOMY;  Surgeon: Cleon Gustin, MD;  Location: [facility];  Service: Urology;  Laterality: Right;   Social History:  reports that he has quit smoking. His smoking use included cigarettes. He has a 10.00 pack-year smoking  history. He has never used smokeless tobacco. He reports that he does not drink alcohol and does not use drugs.  No Known Allergies  Family History  Problem Relation Age of Onset   Diabetes Mother    Arthritis Mother    Cancer Father        pancreatic cancer    Prior to Admission medications   Medication Sig Start Date End Date Taking? Authorizing Provider  allopurinol (ZYLOPRIM) 100 MG tablet Take 300 mg by mouth daily.    [provider]  cephALEXin (KEFLEX) 500 MG capsule Take 1 capsule (500 mg total) by mouth 4 (four) times daily. 12/17/20   Joy, Shawn C, PA-C  ciprofloxacin-dexamethasone (CIPRODEX) OTIC suspension Place 4 drops into the left ear 2 (two) times daily. 03/25/21   Tacy Learn, PA-C  lisinopril-hydrochlorothiazide (PRINZIDE,ZESTORETIC) 20-12.5 MG tablet Take 2 tablets by mouth daily. 11/24/16   Thurnell Lose, MD  methadone (DOLOPHINE) 10 MG/5ML solution Take 120 mg by mouth daily.    [provider]  Multiple Vitamin (MULTIVITAMIN WITH MINERALS) TABS tablet Take 1 tablet by mouth daily.    [provider]  PARoxetine (PAXIL) 40 MG tablet Take 40 mg by mouth daily.    [provider]    Physical Exam: Vitals:   07/30/21 0804 07/30/21 0904 07/30/21 0908 07/30/21 0935  BP: 103/63 (!) 96/50  (!) 80/54  Pulse: 60 (!) 57  (!) 56  Resp: (!) 23 11  16   Temp:   98.1 F (36.7 C)   TempSrc:   Oral   SpO2: 100% 95%  90%   Physical Exam Constitutional:      Appearance: He is obese.  HENT:     Head: Normocephalic and atraumatic.     Mouth/Throat:     Mouth: Mucous membranes are dry.     Pharynx: Oropharynx is clear.  Eyes:     Pupils: Pupils are equal, round, and reactive to light.  Neck:     Vascular: No JVD.  Cardiovascular:     Rate and Rhythm: Normal rate and regular rhythm.     Heart sounds: S1 normal and S2 normal.  Pulmonary:     Effort: Pulmonary effort is normal.     Breath sounds: Normal breath sounds.   Abdominal:     General: There is no distension.     Palpations: Abdomen is soft.     Tenderness: There is abdominal tenderness.  Musculoskeletal:     Cervical back: Neck supple.     Right lower leg: No edema.     Left lower leg: No edema.  Skin:    General: Skin is warm and dry.  Neurological:     General: No focal deficit present.     Mental Status: He is alert and oriented to person, place, and time.  Psychiatric:        Mood and Affect: Mood normal.  Behavior: Behavior normal.   Data Reviewed:  There are no new results to review at this time.  Assessment and Plan: Principal Problem:   AKI (acute kidney injury) ([place]) Secondary to dehydration induced   Hypotension Due to nausea and vomiting secondary to:   Acute gastroenteritis Associated with:   Metabolic acidosis Admit to stepdown/inpatient. Continue IV fluids. Switch Zosyn to ceftriaxone/Flagyl. Hold ACE inhibitor. Hold hydrochlorothiazide. Antiemetics as needed. Avoid hypotension. Avoid nephrotoxins. Monitor intake and output. Monitor renal function electrolytes. Follow-up blood culture and sensitivity. Follow-up lactic acid. Follow-up venous blood gas. PCCM consult appreciated.  Active Problems:   COPD (chronic obstructive pulmonary disease) ([facility]) Supplemental oxygen as needed. BiPAP as needed. Bronchodilators as needed. Solu-Medrol 40 mg IVP x1.    Methadone maintenance therapy patient [facility]) Has not taken it since Saturday. He usually takes 120 mg p.o. daily. Hold daily methadone for now. If BP allows, give 1/3 of dose to avoid withdrawal.    Essential hypertension Hold antihypertensives. Monitor blood pressure.    GAD (generalized anxiety disorder) Continue paroxetine after med reconciliation performed.    Hyperlipidemia   Aortic atherosclerosis ([place]) Not on treatment/history of hep C. Follow-up with primary care provider. Pravastatin could be a choice of treatment.    AAA  (abdominal aortic aneurysm) Follow-up with primary care provider. Should you have surveillance imaging in 2 years.   Advance Care Planning:   Code Status: Full Code   Consults: PCCM Kara Mead, MD).  Family Communication:   Severity of Illness: The appropriate patient status for this patient is INPATIENT. Inpatient status is judged to be reasonable and necessary in order to provide the required intensity of service to ensure the patient's safety. The patient's presenting symptoms, physical exam findings, and initial radiographic and laboratory data in the context of their chronic comorbidities is felt to place them at high risk for further clinical deterioration. Furthermore, it is not anticipated that the patient will be medically stable for discharge from the hospital within 2 midnights of admission.   * I certify that at the point of admission it is my clinical judgment that the patient will require inpatient hospital care spanning beyond 2 midnights from the point of admission due to high intensity of service, high risk for further deterioration and high frequency of surveillance required.*  Author: Reubin Milan, MD 07/30/2021 10:08 AM  For on call review [email or web address].   This document was prepared using Dragon voice recognition software and may contain some unintended transcription errors.

## 2021-07-30 NOTE — ED Provider Notes (Signed)
[facility] DEPT Provider Note   CSN: [id number] Arrival date & time: 07/30/21  6144     History  Chief Complaint  Patient presents with   Shortness of Breath    Jonathan Wilson is a 71 y.o. male.  The history is provided by the patient, the EMS personnel and medical records.  Shortness of Breath Jonathan Wilson is a 71 y.o. male who presents to the Emergency Department complaining of nausea, vomiting, diarrhea.  He presents to the emergency department for evaluation of vomiting and diarrhea that started 2 to 3 days ago.  Symptoms are described as profuse vomiting and diarrhea, no hematochezia or melena.  He also complained to EMS of shortness of breath.  He states he has COPD and his breathing was worse, improved after nebulizer treatment by EMS.  He also complains of epigastric abdominal pain, present for the last few days.  No fevers, chest pain.  He states he has been able to take his home medications.  He has a history of COPD, hepatitis C, renal carcinoma status post partial nephrectomy, cirrhosis.  He is currently being evaluated for liver transplant.    Home Medications Prior to Admission medications   Medication Sig Start Date End Date Taking? Authorizing Provider  allopurinol (ZYLOPRIM) 100 MG tablet Take 300 mg by mouth daily.    [provider]  cephALEXin (KEFLEX) 500 MG capsule Take 1 capsule (500 mg total) by mouth 4 (four) times daily. 12/17/20   Joy, Shawn C, PA-C  ciprofloxacin-dexamethasone (CIPRODEX) OTIC suspension Place 4 drops into the left ear 2 (two) times daily. 03/25/21   Tacy Learn, PA-C  lisinopril-hydrochlorothiazide (PRINZIDE,ZESTORETIC) 20-12.5 MG tablet Take 2 tablets by mouth daily. 11/24/16   Thurnell Lose, MD  methadone (DOLOPHINE) 10 MG/5ML solution Take 120 mg by mouth daily.    [provider]  Multiple Vitamin (MULTIVITAMIN WITH MINERALS) TABS tablet Take 1 tablet by mouth daily.    [provider]  PARoxetine (PAXIL) 40 MG tablet Take 40 mg by mouth daily.    [provider]      Allergies    Patient has no known allergies.    Review of Systems   Review of Systems  Respiratory:  Positive for shortness of breath.   All other systems reviewed and are negative.  Physical Exam Updated Vital Signs BP (!) 77/40 Comment: Dr. Ralene Bathe aware   Pulse 68    Temp 97.8 F (36.6 C) (Oral)    Resp (!) 24    SpO2 95%  Physical Exam Vitals and nursing note reviewed.  Constitutional:      Appearance: He is well-developed.     Comments: Drowsy but conversant  HENT:     Head: Normocephalic and atraumatic.  Cardiovascular:     Rate and Rhythm: Normal rate and regular rhythm.     Heart sounds: No murmur heard. Pulmonary:     Effort: Pulmonary effort is normal. No respiratory distress.     Breath sounds: Normal breath sounds.  Abdominal:     Palpations: Abdomen is soft.     Tenderness: There is no guarding or rebound.     Comments: Moderate epigastric tenderness  Musculoskeletal:        General: No swelling or tenderness.  Skin:    General: Skin is warm and dry.     Coloration: Skin is pale.  Neurological:     Mental Status: He is oriented to person, place, and time.  Psychiatric:        Behavior: Behavior normal.    ED Results / Procedures / Treatments   Labs (all labs ordered are listed, but only abnormal results are displayed) Labs Reviewed  COMPREHENSIVE METABOLIC PANEL - Abnormal; Notable for the following components:      Result Value   Chloride 97 (*)    Glucose, Bld 154 (*)    BUN 42 (*)    Creatinine, Ser 3.03 (*)    GFR, Estimated 22 (*)    All other components within normal limits  CBC WITH DIFFERENTIAL/PLATELET - Abnormal; Notable for the following components:   WBC 10.9 (*)    Neutro Abs 8.3 (*)    Monocytes Absolute 1.1 (*)    All other components within normal limits  BLOOD GAS, VENOUS - Abnormal; Notable for the following components:    pH, Ven 7.19 (*)    pCO2, Ven 90 (*)    pO2, Ven <31 (*)    Bicarbonate 34.4 (*)    Acid-Base Excess 2.9 (*)    All other components within normal limits  MAGNESIUM - Abnormal; Notable for the following components:   Magnesium 2.6 (*)    All other components within normal limits  CULTURE, BLOOD (ROUTINE X 2)  CULTURE, BLOOD (ROUTINE X 2)  URINE CULTURE  LACTIC ACID, PLASMA  LIPASE, BLOOD  AMMONIA  PROTIME-INR  LACTIC ACID, PLASMA  URINALYSIS, ROUTINE W REFLEX MICROSCOPIC    EKG EKG Interpretation  Date/Time:  Monday July 30 2021 05:37:36 EST Ventricular Rate:  70 PR Interval:  154 QRS Duration: 109 QT Interval:  446 QTC Calculation: 482 R Axis:   -54 Text Interpretation: Sinus rhythm Incomplete RBBB and LAFB Abnormal R-wave progression, early transition Borderline prolonged QT interval Confirmed by Quintella Reichert [phone]) on 07/30/2021 6:09:02 AM  Radiology CT Abdomen Pelvis Wo Contrast  Result Date: 07/30/2021 CLINICAL DATA:  Nausea and vomiting with abdominal pain.  Diarrhea. EXAM: CT ABDOMEN AND PELVIS WITHOUT CONTRAST TECHNIQUE: Multidetector CT imaging of the abdomen and pelvis was performed following the standard protocol without IV contrast. RADIATION DOSE REDUCTION: This exam was performed according to the departmental dose-optimization program which includes automated exposure control, adjustment of the mA and/or kV according to patient size and/or use of iterative reconstruction technique. COMPARISON:  07/06/2020 FINDINGS: Lower chest: No acute finding. Aortic and coronary atherosclerosis. Hepatobiliary: No focal liver abnormality.Cholecystectomy. Pancreas: Generalized atrophy without acute or focal finding. Spleen: Unremarkable. Adrenals/Urinary Tract: Negative adrenals. Changes of partial nephrectomy at the upper pole right kidney. No masslike finding, collection, or hydronephrosis. Unremarkable bladder. Stomach/Bowel: Fluid level seen within the moderately distended  stomach, within nondilated small bowel, and within the colon reaching the rectum. No obstructive pattern or bowel wall thickening. Normal appendix. Vascular/Lymphatic: No acute vascular abnormality. Atherosclerosis. Thick walled infrarenal aorta with 3.7 cm diameter, unchanged when remeasured in a similar fashion. No mass or adenopathy. Reproductive:No pathologic findings. Other: No ascites or pneumoperitoneum. Musculoskeletal: No acute abnormalities. IMPRESSION: 1. Diffuse fluid levels in bowel likely reflecting diarrheal illness/enteritis. No bowel wall thickening or obstruction. 2. Infrarenal aortic aneurysm measuring up to 3.7 cm with chronic mural thickening/inflammation. Recommend follow-up ultrasound every 2 years. This recommendation follows ACR consensus guidelines: White Paper of the ACR Incidental Findings Committee II on Vascular Findings. J Am Coll Radiol 2013; 44:034-742. 3. Aortic Atherosclerosis (ICD10-I70.0). Aortic aneurysm NOS (ICD10-I71.9). Electronically Signed   By: Jorje Guild M.D.   On: 07/30/2021 07:17   DG Chest [facility] 1 View  Result  Date: 07/30/2021 CLINICAL DATA:  Shortness of breath. EXAM: PORTABLE CHEST 1 VIEW COMPARISON:  07/07/2020 FINDINGS: 0607 hours. The lungs are clear without focal pneumonia, edema, pneumothorax or pleural effusion. Minimal atelectasis noted right base. The cardiopericardial silhouette is within normal limits for size. The visualized bony structures of the thorax are unremarkable. Telemetry leads overlie the chest. IMPRESSION: Minimal right base atelectasis. Otherwise no acute cardiopulmonary findings. Electronically Signed   By: Misty Stanley M.D.   On: 07/30/2021 06:24    Procedures Procedures   CRITICAL CARE Performed by: Quintella Reichert   Total critical care time: 45 minutes  Critical care time was exclusive of separately billable procedures and treating other patients.  Critical care was necessary to treat or prevent imminent or  life-threatening deterioration.  Critical care was time spent personally by me on the following activities: development of treatment plan with patient and/or surrogate as well as nursing, discussions with consultants, evaluation of patient's response to treatment, examination of patient, obtaining history from patient or surrogate, ordering and performing treatments and interventions, ordering and review of laboratory studies, ordering and review of radiographic studies, pulse oximetry and re-evaluation of patient's condition.  Medications Ordered in ED Medications  norepinephrine (LEVOPHED) 4mg  in 253mL (0.016 mg/mL) premix infusion (has no administration in time range)  lactated ringers bolus 2,000 mL (has no administration in time range)  sodium chloride 0.9 % bolus 1,000 mL (0 mLs Intravenous Stopped 07/30/21 0713)  ondansetron (ZOFRAN) injection 4 mg (4 mg Intravenous Given 07/30/21 0624)  albuterol (PROVENTIL) (2.5 MG/3ML) 0.083% nebulizer solution (10 mg/hr Nebulization Given 07/30/21 0637)  lactated ringers bolus 2,000 mL (2,000 mLs Intravenous New Bag/Given 07/30/21 0714)  piperacillin-tazobactam (ZOSYN) IVPB 3.375 g (3.375 g Intravenous New Bag/Given 07/30/21 6945)    ED Course/ Medical Decision Making/ A&P                           Medical Decision Making Amount and/or Complexity of Data Reviewed Labs: ordered. Radiology: ordered.  Risk Prescription drug management. Decision regarding hospitalization.   Patient with COPD, cirrhosis here for evaluation of vomiting and diarrhea for the last several days, increase shortness of breath in route to the hospital.  He received albuterol treatment in route to the hospital with improvement in his breathing.  On presentation patient without respiratory distress but does appear altered and is somewhat lethargic.  VBG with acidosis, respiratory and suspect an element of metabolic component as well.  On reassessment patient's mentation is better,  fair air movement bilaterally, no wheezes.  Given his COPD and VBG gas will start on BiPAP with albuterol through the circuit.  Do not feel pt needs solumedrol for COPD exacerbation at this time.  Patient's mental status is improved enough that he would be able to remove his mask if he were develop recurrent nausea and vomiting.  Labs returned with a creatinine of 3.  He was treated with ongoing fluid resuscitation with persistent hypotension.  He was started on antibiotics for potential intra-abdominal infection and sepsis pending further work-up although lactic acid is within normal limits.  He was also started on Levophed for persistent hypotension.  He did take his home blood pressure medications this morning, which may be a contributing factor.  Patient is on chronic methadone, last dose was on Saturday.  He does not appear to be in acute opiate withdrawal at this time.  Discussed with patient findings of studies and recommendation for admission and he  is in agreement with treatment plan, critical care consulted for admission.        Final Clinical Impression(s) / ED Diagnoses Final diagnoses:  Shock ([place])  AKI (acute kidney injury) ([place])  Acute respiratory failure with hypercapnia [facility])    Rx / DC Orders ED Discharge Orders     None         Quintella Reichert, MD 07/30/21 [phone]

## 2021-07-30 NOTE — Progress Notes (Signed)
RT NOTE:  RT evaluated pt for need of BiPAP. Pt is alert and oriented to himself, place, year, etc. Pt states he does not feel nauseated at this time. RT spoke with MD Ralene Bathe the current attending EDP who agrees that BiPAP would be appropriate at this time. Pt educated on taking the BiPAP mask off if he does become nauseas and pt stated understanding and demonstrated taking it off to RT. RN is aware of BiPAP placement. Vitals are stable at this time, RT will continue to monitor pt status.

## 2021-07-30 NOTE — Progress Notes (Signed)
Pt is sitting comfortably, No resp distress noted. Stable VS. Bipap not needed at this time.

## 2021-07-30 NOTE — ED Notes (Signed)
Attempted IV x 2 with no success. 

## 2021-07-30 NOTE — ED Notes (Addendum)
Urinal at bedside. Pt unable to provide sample at this time.

## 2021-07-30 NOTE — Progress Notes (Signed)
Pharmacy Note   A consult was received from an ED physician for Zosyn per pharmacy dosing.    The patient's profile has been reviewed for ht/wt/allergies/indication/available labs.    A one time order has been placed for Zosyn 3.375 gr IV x1 .    Further antibiotics/pharmacy consults should be ordered by admitting physician if indicated.                       Thank you,  Royetta Asal, PharmD, BCPS 07/30/2021 6:46 AM

## 2021-07-30 NOTE — Consult Note (Signed)
NAME:  Jonathan Wilson, MRN:  [id number], DOB:  [birth year], LOS: 0 ADMISSION DATE:  07/30/2021 CONSULTATION DATE:  07/30/2021 REFERRING MD:  Ralene Bathe - EDP CHIEF COMPLAINT:  Hypotension  History of Present Illness:  71 year old man who presented to [facility] ED 2/27 for nausea for 2 days and vomiting and severe diarrhea for 1 day, no melena or hematemesis. He was given an albuterol neb by EMS for shortness of breath , noted to be lethargic on arrival to the ED although without respiratory distress , VBG showed hypercarbia so placed on BiPAP. Hypotensive, given 4 L of fluid, lactate normal Levophed ordered but never started. Labs showed creatinine of 3, BUN 42 VBG of 7.19/90, lactate 1.6, WBC 10.9, no left shift He reports last dose of methadone was on 2022-09-15, did not take his dose on Sunday    PMHx significant for HTN, HLD, RLL lung mass (resolved on f/u CT 09/15/10), hepatitis C and EtOH related cirrhosis, renal CA status post partial right nephrectomy, diverticulosis, anxiety/depression.  COPD, 1 pack/day smoker Chronic opiate use on methadone 120 mg daily  -[facility]  Pertinent Medical History:   Past Medical History:  Diagnosis Date   Anxiety    Anxiety and depression 09-15-1998   worse after death of wife April 16, 2013   Depression    Diverticulosis of colon 2009/09/14   seen on CT scan in 14-Sep-2009.    Gout    Hepatitis C before Sep 15, 1998   Hyperlipidemia    Hypertension    Influenza A 06/2013   acute resp failure, did not require intubation.   Obesity    300 # in 2008-09-14, BMI 34 in 04/2013.    Pneumonia    recurrent episodes    Psychosis ([place]) 09-15-1998   s/e of treatment for Hepatitis C.     Renal cancer ([place])    Right lower lobe lung mass 05/2008   resolved on follow up CT 09-15-2010.    Vitamin D deficiency    Significant Hospital Events: Including procedures, antibiotic start and stop dates in addition to other pertinent events   2/27 hypotension improved  with fluids CT abdomen/pelvis showed diffuse fluid levels and bowel?  Enteritis, infrarenal aortic aneurysm 3.7 cm  Interim History / Subjective:  PCCM consulted for admission in the setting of hypotension, pressor needs  Objective:  Blood pressure (!) 77/40, pulse 68, temperature 97.8 F (36.6 C), temperature source Oral, resp. rate (!) 24, SpO2 95 %.       No intake or output data in the 24 hours ending 07/30/21 0748 There were no vitals filed for this visit.  Physical Examination: General: Acutely ill-appearing man, sitting up in bed in NAD. HEENT: [place]/AT, anicteric sclera, PERRL, dry mucous membranes. Neuro: Awake, oriented x 3. Moves all 4 extremities spontaneously. Strength 5/5 in all extremities.  Conversant, nonfocal, asterixis CV: RRR, no m/g/r. PULM: Breathing even and unlabored . Lung fields decreased breath sounds. GI: Soft, nontender, nondistended. Normoactive bowel sounds. Extremities: No LE edema noted. Skin: Warm/dry, no rash.   Chest x-ray independently reviewed appears clear without infiltrates or effusions  Resolved Hospital Problem List:    Assessment & Plan:  Hypotension related to hypovolemia and diarrheal illness , could be sepsis from bacterial translocation but more likely just hypovolemia -Lactate normal, blood pressures responded to fluids and Levophed was not required -After 4 L, continue IV fluids at 125/hour -Await culture data, can do empiric ceftriaxone meantime  AKI -related to  above in the setting of lisinopril -Expect to improve with hydration  Acute on chronic respiratory acidosis/hypercarbic respiratory failure -He did not tolerate BiPAP after 30 minutes and insisted on taking this off.  He is alert and oriented now and we can follow mental status to decide whether BiPAP should be reinstituted -Can obtain ABG in 3 to 4 hours -Review of previous [facility] shows bicarbonate of 29 so possible that he has chronic hypercarbia in the 50  range  Chronic opiate use disorder -last methadone was on Saturday -He will need to be redosed today but concerned about mental status, I would hold off for 3 hours and then consider redosing at one third dose for today 40 mg to avoid withdrawal, wait for renal function to improve before increasing dosing   PCCM will follow   Best Practice: (right click and "Reselect all SmartList Selections" daily)   Diet/type: full liquids  DVT prophylaxis: prophylactic heparin  GI prophylaxis: N/A Lines: N/A Foley:  N/A Code Status:  full code Last date of multidisciplinary goals of care discussion [NA]  Labs:  CBC: Recent Labs  Lab 07/30/21 0546  WBC 10.9*  NEUTROABS 8.3*  HGB 14.3  HCT 45.7  MCV 95.4  PLT 850   Basic Metabolic Panel: Recent Labs  Lab 07/30/21 0546 07/30/21 0621  NA 138  --   K 4.4  --   CL 97*  --   CO2 31  --   GLUCOSE 154*  --   BUN 42*  --   CREATININE 3.03*  --   CALCIUM 9.7  --   MG  --  2.6*   GFR: CrCl cannot be calculated (Unknown ideal weight.). Recent Labs  Lab 07/30/21 0546  WBC 10.9*  LATICACIDVEN 1.6   Liver Function Tests: Recent Labs  Lab 07/30/21 0546  AST 23  ALT 18  ALKPHOS 91  BILITOT 0.6  PROT 8.0  ALBUMIN 4.3   Recent Labs  Lab 07/30/21 0546  LIPASE 36   Recent Labs  Lab 07/30/21 0543  AMMONIA <10   ABG:    Component Value Date/Time   HCO3 34.4 (H) 07/30/2021 0546   O2SAT 12.5 07/30/2021 0546    Coagulation Profile: Recent Labs  Lab 07/30/21 0546  INR 1.1   Cardiac Enzymes: No results for input(s): CKTOTAL, CKMB, CKMBINDEX, TROPONINI in the last 168 hours.  HbA1C: Hemoglobin A1C  Date/Time Value Ref Range Status  05/24/2015 03:21 PM 4.90  Final   CBG: No results for input(s): GLUCAP in the last 168 hours.  Review of Systems:   Review of systems completed with pertinent positives/negatives outlined in above HPI.  Denies abdominal pain, fevers, chills, hematemesis or melena  Past Medical  History:  He,  has a past medical history of Anxiety, Anxiety and depression (2000), Depression, Diverticulosis of colon (2011), Gout, Hepatitis C (before 2000), Hyperlipidemia, Hypertension, Influenza A (06/2013), Obesity, Pneumonia, Psychosis ([place]) (2000), Renal cancer ([place]), Right lower lobe lung mass (05/2008), and Vitamin D deficiency.   Surgical History:   Past Surgical History:  Procedure Laterality Date   CHOLECYSTECTOMY N/A 01/14/2014   Procedure: LAPAROSCOPIC CHOLECYSTECTOMY WITH INTRAOPERATIVE CHOLANGIOGRAM;  Surgeon: Shann Medal, MD;  Location: [facility];  Service: General;  Laterality: N/A;   MANDIBLE FRACTURE SURGERY     ROBOTIC ASSITED PARTIAL NEPHRECTOMY Right 08/07/2015   Procedure: XI ROBOTIC ASSISTED RIGHT PARTIAL NEPHRECTOMY;  Surgeon: Cleon Gustin, MD;  Location: [facility];  Service: Urology;  Laterality: Right;     Social  History:   reports that he has quit smoking. His smoking use included cigarettes. He has a 10.00 pack-year smoking history. He has never used smokeless tobacco. He reports that he does not drink alcohol and does not use drugs.   Family History:  His family history includes Arthritis in his mother; Cancer in his father; Diabetes in his mother.   Allergies: No Known Allergies   Home Medications: Prior to Admission medications   Medication Sig Start Date End Date Taking? Authorizing Provider  allopurinol (ZYLOPRIM) 100 MG tablet Take 300 mg by mouth daily.    [provider]  cephALEXin (KEFLEX) 500 MG capsule Take 1 capsule (500 mg total) by mouth 4 (four) times daily. 12/17/20   Joy, Shawn C, PA-C  ciprofloxacin-dexamethasone (CIPRODEX) OTIC suspension Place 4 drops into the left ear 2 (two) times daily. 03/25/21   Tacy Learn, PA-C  lisinopril-hydrochlorothiazide (PRINZIDE,ZESTORETIC) 20-12.5 MG tablet Take 2 tablets by mouth daily. 11/24/16   Thurnell Lose, MD  methadone (DOLOPHINE) 10 MG/5ML solution Take 120 mg by mouth daily.     [provider]  Multiple Vitamin (MULTIVITAMIN WITH MINERALS) TABS tablet Take 1 tablet by mouth daily.    [provider]  PARoxetine (PAXIL) 40 MG tablet Take 40 mg by mouth daily.    [provider]    Critical care time: [place]. Elsworth Soho MD [facility] 07/30/21 7:48 AM  Please see [email or web address] for pager details.  From 7A-7P if no response, please call [phone] After hours, please call ELink [phone]

## 2021-07-30 NOTE — ED Triage Notes (Signed)
Patient arrived with complaints of NVD and shortness of breath over the last few days  Hx of COPD  Duoneb given en route

## 2021-07-30 NOTE — Progress Notes (Signed)
RT NOTE:  Pt removed from BiPAP by Elsworth Soho, MD with CCM.

## 2021-07-31 ENCOUNTER — Telehealth: Payer: Self-pay | Admitting: Pulmonary Disease

## 2021-07-31 ENCOUNTER — Encounter: Payer: Self-pay | Admitting: Pulmonary Disease

## 2021-07-31 DIAGNOSIS — R5381 Other malaise: Secondary | ICD-10-CM

## 2021-07-31 DIAGNOSIS — E860 Dehydration: Secondary | ICD-10-CM

## 2021-07-31 DIAGNOSIS — N179 Acute kidney failure, unspecified: Secondary | ICD-10-CM | POA: Diagnosis not present

## 2021-07-31 DIAGNOSIS — K529 Noninfective gastroenteritis and colitis, unspecified: Secondary | ICD-10-CM

## 2021-07-31 DIAGNOSIS — J441 Chronic obstructive pulmonary disease with (acute) exacerbation: Secondary | ICD-10-CM

## 2021-07-31 DIAGNOSIS — J42 Unspecified chronic bronchitis: Secondary | ICD-10-CM

## 2021-07-31 DIAGNOSIS — J9602 Acute respiratory failure with hypercapnia: Secondary | ICD-10-CM | POA: Diagnosis not present

## 2021-07-31 DIAGNOSIS — R112 Nausea with vomiting, unspecified: Secondary | ICD-10-CM

## 2021-07-31 DIAGNOSIS — I714 Abdominal aortic aneurysm, without rupture, unspecified: Secondary | ICD-10-CM

## 2021-07-31 DIAGNOSIS — I10 Essential (primary) hypertension: Secondary | ICD-10-CM

## 2021-07-31 DIAGNOSIS — F1120 Opioid dependence, uncomplicated: Secondary | ICD-10-CM

## 2021-07-31 DIAGNOSIS — J9621 Acute and chronic respiratory failure with hypoxia: Secondary | ICD-10-CM

## 2021-07-31 DIAGNOSIS — I959 Hypotension, unspecified: Secondary | ICD-10-CM

## 2021-07-31 DIAGNOSIS — J9622 Acute and chronic respiratory failure with hypercapnia: Secondary | ICD-10-CM

## 2021-07-31 LAB — BASIC METABOLIC PANEL
Anion gap: 6 (ref 5–15)
BUN: 34 mg/dL — ABNORMAL HIGH (ref 8–23)
CO2: 27 mmol/L (ref 22–32)
Calcium: 8.7 mg/dL — ABNORMAL LOW (ref 8.9–10.3)
Chloride: 106 mmol/L (ref 98–111)
Creatinine, Ser: 1.79 mg/dL — ABNORMAL HIGH (ref 0.61–1.24)
GFR, Estimated: 41 mL/min — ABNORMAL LOW (ref >60)
Glucose, Bld: 95 mg/dL (ref 70–99)
Potassium: 4.8 mmol/L (ref 3.5–5.1)
Sodium: 139 mmol/L (ref 135–145)

## 2021-07-31 LAB — CBC WITH DIFFERENTIAL/PLATELET
Abs Immature Granulocytes: 0.04 10*3/uL (ref 0.00–0.07)
Basophils Absolute: 0 10*3/uL (ref 0.0–0.1)
Basophils Relative: 0 %
Eosinophils Absolute: 0 10*3/uL (ref 0.0–0.5)
Eosinophils Relative: 0 %
HCT: 33.6 % — ABNORMAL LOW (ref 39.0–52.0)
Hemoglobin: 10.6 g/dL — ABNORMAL LOW (ref 13.0–17.0)
Immature Granulocytes: 0 %
Lymphocytes Relative: 9 %
Lymphs Abs: 1 10*3/uL (ref 0.7–4.0)
MCH: 30.4 pg (ref 26.0–34.0)
MCHC: 31.5 g/dL (ref 30.0–36.0)
MCV: 96.3 fL (ref 80.0–100.0)
Monocytes Absolute: 0.8 10*3/uL (ref 0.1–1.0)
Monocytes Relative: 8 %
Neutro Abs: 8.5 10*3/uL — ABNORMAL HIGH (ref 1.7–7.7)
Neutrophils Relative %: 83 %
Platelets: 161 10*3/uL (ref 150–400)
RBC: 3.49 MIL/uL — ABNORMAL LOW (ref 4.22–5.81)
RDW: 13.9 % (ref 11.5–15.5)
WBC: 10.3 10*3/uL (ref 4.0–10.5)
nRBC: 0 % (ref 0.0–0.2)

## 2021-07-31 LAB — HIV ANTIBODY (ROUTINE TESTING W REFLEX): HIV Screen 4th Generation wRfx: NONREACTIVE

## 2021-07-31 MED ORDER — MOMETASONE FURO-FORMOTEROL FUM 200-5 MCG/ACT IN AERO
2.0000 | INHALATION_SPRAY | Freq: Two times a day (BID) | RESPIRATORY_TRACT | Status: DC
  Administered 2021-07-31 – 2021-08-01 (×3): 2 via RESPIRATORY_TRACT
  Filled 2021-07-31: qty 8.8

## 2021-07-31 MED ORDER — IPRATROPIUM-ALBUTEROL 0.5-2.5 (3) MG/3ML IN SOLN
3.0000 mL | Freq: Four times a day (QID) | RESPIRATORY_TRACT | Status: DC
  Administered 2021-07-31 (×2): 3 mL via RESPIRATORY_TRACT
  Filled 2021-07-31 (×2): qty 3

## 2021-07-31 MED ORDER — ALBUTEROL SULFATE (2.5 MG/3ML) 0.083% IN NEBU: 2.5000 mg | INHALATION_SOLUTION | RESPIRATORY_TRACT | Status: DC | PRN

## 2021-07-31 MED ORDER — METHYLPREDNISOLONE SODIUM SUCC 125 MG IJ SOLR
120.0000 mg | INTRAMUSCULAR | Status: DC
  Administered 2021-07-31 – 2021-08-01 (×2): 120 mg via INTRAVENOUS
  Filled 2021-07-31 (×2): qty 2

## 2021-07-31 MED ORDER — SODIUM CHLORIDE 0.9 % IV SOLN: INTRAVENOUS | Status: DC

## 2021-07-31 MED ORDER — IPRATROPIUM-ALBUTEROL 0.5-2.5 (3) MG/3ML IN SOLN
3.0000 mL | Freq: Three times a day (TID) | RESPIRATORY_TRACT | Status: DC
  Administered 2021-08-01: 3 mL via RESPIRATORY_TRACT
  Filled 2021-07-31: qty 3

## 2021-07-31 NOTE — Progress Notes (Signed)
Progress Note   Patient: Jonathan Wilson:[id number] DOB: 06/29/1950 DOA: 07/30/2021     1 DOS: the patient was seen and examined on 07/31/2021   Brief hospital course: 71 y.o. male with medical history significant of anxiety, depression, diverticulosis, gout, hepatitis C, hyperlipidemia, hypertension, recurrent pneumonia, psychosis, renal cell carcinoma status post partial nephrectomy, vitamin D deficiency presented with worsening shortness of breath.  On presentation, patient was hypotensive and hypoxic requiring supplemental oxygen.  WBC 10.9, lactic acid was 1.6 and then 2.2, influenza and COVID testing were negative.  Creatinine was 3.03.  CT of abdomen/pelvis showed diffuse fluid level in bowel likely reflecting diarrheal illness/enteritis along with infrarenal AAA measuring up to 3.7 cm, follow-up recommended in 2 years.  He was started on IV fluids and antibiotics.  PCCM was consulted for hypoxia and hypotension.  Assessment and Plan: * AKI (acute kidney injury) ([place])- (present on admission) Hypotension Dehydration Nausea and vomiting/acute gastroenteritis -Patient presented with nausea, vomiting, diarrhea leading to dehydration, hypotension and acute kidney injury -CT abdomen showed features of enteritis/diarrheal illness.  No diarrhea since admission.  DC stool testing.  Doubt that this is bacterial infection.  DC antibiotics. -Presented with creatinine of 3.03.  Treated with IV fluids.  Creatinine 1.79 today. -Nausea and vomiting have improved.  Advance diet to soft diet today. -Blood pressure has much improved.  PCCM has signed off today.  Antihypertensives to remain on hold for now.  Acute on chronic respiratory failure with hypoxia and hypercapnia (HCC) Possible COPD with mild exacerbation Tobacco abuse -Required BiPAP on presentation and was hypercapnic. -Subsequently respiratory status has much improved.  Currently on 3 L oxygen by nasal cannula. -Patient was counseled  regarding tobacco cessation by PCCM. -I will start him on Solu-Medrol 60 mg IV every 12 hours.  Continue nebs.  Add Dulera.  Outpatient follow-up with PCCM  Methadone maintenance therapy patient [facility]) - Patient apparently takes methadone 120 mg daily. -He has been started on methadone 20 mg twice a day for now.  This can be uptitrated if mental status remains stable.  Essential hypertension- (present on admission) - blood pressure improving.  Antihypertensives on hold for now.  GAD (generalized anxiety disorder)- (present on admission) - Continue Paxil and clonazepam.  Outpatient follow-up with PCP/psychiatry  Hyperlipidemia- (present on admission) Aortic atherosclerosis -Outpatient follow-up with PCP.  Currently not on treatment for hyperlipidemia.  AAA (abdominal aortic aneurysm)- (present on admission) - Should have surveillance imaging in 2 years.  Outpatient follow-up.  Physical deconditioning - PT eval   Subjective:  Patient seen and examined at bedside.  Feels slightly better.  No diarrhea since admission.  Denies any worsening abdominal pain.  No vomiting or nausea currently; wants to try solid food.  No fever or worsening shortness of breath reported.  Physical Exam: Vitals:   07/31/21 0400 07/31/21 0507 07/31/21 0600 07/31/21 0800  BP:  (!) 121/56 (!) 105/58   Pulse:  (!) 57 (!) 54   Resp:  (!) 23 19   Temp: 98.4 F (36.9 C)   98.3 F (36.8 C)  TempSrc: Oral   Oral  SpO2:  94% 96%    General: No acute distress, currently on 3 L oxygen via nasal cannula.  Looks chronically ill and deconditioned.   ENT/neck: No elevated JVD.  No obvious masses  respiratory: Bilateral decreased breath sounds at bases with some scattered crackles with intermittent tachypnea CVS: S1-S2 heard, intermittently bradycardic  abdominal: Soft, nontender, nondistended, no organomegaly, bowel sounds heard Extremities:  No cyanosis, clubbing, edema CNS: Awake, slow to respond, poor historian.   No focal neurologic deficit.  Moving extremities. Lymph: No cervical lymphadenopathy Skin: No rashes, lesions, ulcers Psych: Affect is mostly flat.  No signs of agitation.   Musculoskeletal: No obvious joint deformity/tenderness/swelling   Data Reviewed: I have reviewed patient's investigations during this hospitalization [facility] including blood work and imaging.  Today, sodium 139, potassium 4.8, creatinine 1.79, WBC 10.3, hemoglobin 10.6, hematocrit 33.6, platelets 161, cultures have been negative so far.   Family Communication: None at bedside  Disposition: Status is: Inpatient Remains inpatient appropriate because: Of severity of illness   Planned Discharge Destination: Home pending clinical improvement in the next few days  Author: Aline August, MD 07/31/2021 10:59 AM  For on call review [email or web address].

## 2021-07-31 NOTE — Assessment & Plan Note (Addendum)
Physical therapy consulted but no needs identified.

## 2021-07-31 NOTE — Assessment & Plan Note (Addendum)
Associated opioid dependence. Continue home methadone on discharge.

## 2021-07-31 NOTE — Assessment & Plan Note (Signed)
-   Questionable cause.  Required up to 8 L oxygen via nasal cannula on presentation.  Currently on 3 L oxygen via nasal cannula.  Wean off as able.  Chest x-ray did not show any infiltrates on presentation.  COVID-19 and influenza testing negative on presentation

## 2021-07-31 NOTE — Progress Notes (Signed)
NAME:  KUE FOX, MRN:  [id number], DOB:  [birth year], LOS: 1 ADMISSION DATE:  07/30/2021 CONSULTATION DATE:  07/30/2021 REFERRING MD:  Ralene Bathe - EDP CHIEF COMPLAINT:  Hypotension  History of Present Illness:  71 year old man who presented to [facility] ED 2/27 for nausea for 2 days and vomiting and severe diarrhea for 1 day, no melena or hematemesis. He was given an albuterol neb by EMS for shortness of breath, noted to be lethargic on arrival to the ED although without respiratory distress , VBG showed hypercarbia so placed on BiPAP. Hypotensive, given 4 L of fluid, lactate normal Levophed ordered but never started. Labs showed creatinine of 3, BUN 42 VBG of 7.19/90, lactate 1.6, WBC 10.9, no left shift He reports last dose of methadone was on 09-13-22, did not take his dose on Sunday.  PMHx significant for HTN, HLD, COPD (1 PPD smoker), RLL lung mass (resolved on f/u CT 09-13-10), hepatitis C and EtOH related cirrhosis, renal CA status post partial right nephrectomy, diverticulosis, anxiety/depression.  Chronic opiate use on methadone 120mg  daily.  -[facility]  Pertinent Medical History:   Past Medical History:  Diagnosis Date   Anxiety    Anxiety and depression 09/13/1998   worse after death of wife 04/14/2013   Depression    Diverticulosis of colon 2009-09-12   seen on CT scan in Sep 12, 2009.    Gout    Hepatitis C before Sep 13, 1998   Hyperlipidemia    Hypertension    Influenza A 06/2013   acute resp failure, did not require intubation.   Obesity    300 # in 09-12-08, BMI 34 in 04/2013.    Pneumonia    recurrent episodes    Psychosis ([place]) 13-Sep-1998   s/e of treatment for Hepatitis C.     Renal cancer ([place])    Right lower lobe lung mass 05/2008   resolved on follow up CT 13-Sep-2010.    Vitamin D deficiency    Significant Hospital Events: Including procedures, antibiotic start and stop dates in addition to other pertinent events   2/27 hypotension improved with  fluids. CT abdomen/pelvis showed diffuse fluid levels and bowel?  Enteritis, infrarenal aortic aneurysm 3.7 cm. Peripheral Levo never started. 2/28 Improved BP s/p significant fluid resuscitation, no pressors. No further nausea/emesis.  Interim History / Subjective:  No significant events overnight Reports feeling better overall today Ongoing mild "jerking" of BUE/BLE Denies fever/chills, chest pain/SOB, no further nausea or vomiting BP improved with aggressive fluid resuscitation Peripheral Levophed ordered, but never started due to improved MAP Pharmacy working to determine patient's home medications, as there is some discrepancy between list in Epic, patient's home pharmacies and patient's recall PCCM will sign off at this time and arrange outpatient pulmonary follow-up for COPD  Objective:  Blood pressure (!) 105/58, pulse (!) 54, temperature 98.3 F (36.8 C), temperature source Oral, resp. rate 19, SpO2 96 %.        Intake/Output Summary (Last 24 hours) at 07/31/2021 0944 Last data filed at 07/31/2021 0300 Gross per 24 hour  Intake 1837.91 ml  Output 1100 ml  Net 737.91 ml   There were no vitals filed for this visit.  Physical Examination: General: Acute-on-chronically ill-appearing middle-aged man in NAD. Resting comfortably in bed. HEENT: [place]/AT, anicteric sclera, PERRL, dry mucous membranes. Neuro: Awake, oriented x 4. Responds to verbal stimuli. Following commands consistently. Moves all 4 extremities spontaneously. Strength 5/5 in all 4 extremities. Intermittent nonrhythmic jerking of  BUE/BLE. +Asterixis CV: RRR, no m/g/r. PULM: Breathing even and unlabored on 3LNC. Lung fields diminished bilaterally. GI: Soft, nontender, nondistended. Normoactive bowel sounds. Extremities: No significant LE edema noted. Skin: Warm/dry, no rashes.  Physical Examination: General: Acutely ill-appearing man, sitting up in bed in NAD. HEENT: [place]/AT, anicteric sclera, PERRL, dry mucous  membranes. Neuro: Awake, oriented x 3. Moves all 4 extremities spontaneously. Strength 5/5 in all extremities.  Conversant, nonfocal, asterixis CV: RRR, no m/g/r. PULM: Breathing even and unlabored . Lung fields decreased breath sounds. GI: Soft, nontender, nondistended. Normoactive bowel sounds. Extremities: No LE edema noted. Skin: Warm/dry, no rash.  Resolved Hospital Problem List:    Assessment & Plan:  Hypotension related to hypovolemia and diarrheal illness, improved Could be sepsis from bacterial translocation but more likely just hypovolemia - Significantly improved BP today, 2/28 - MAPs remain within acceptable range - Continue maintenance LR - Peripheral Levophed ordered, but never started as MAP goal was maintained with fluids alone - LA downtrended to normal - Continue empiric ceftriaxone, though doubt infection at this juncture with clinical improvement - F/u finalized culture data  AKI, improving Related to above in the setting of lisinopril - Trend BMP (Cr 1.79 from 2.72 today, 2/28) - Replete electrolytes as indicated - Monitor I&Os - Avoid nephrotoxic agents as able - Ensure adequate renal perfusion  Acute-on-chronic respiratory acidosis/hypercarbic respiratory failure History of ?COPD Did not tolerate BiPAP after 30 minutes and insisted on taking this off.  He is alert and oriented now and we can follow mental status to decide whether BiPAP should be reinstituted. Review of previous BMP shows bicarbonate of 29 so possible that he has chronic hypercarbia in the 50 range. - Continue supplemental O2 support - Wean O2 for sat > 88% - DuoNebs PRN - Pulmonary hygiene, IS/flutter, mobilize as able - Will arrange outpatient pulmonary f/u  Chronic opiate use disorder -last methadone was on Saturday, 120mg  daily - Resumed at lower dose 2/27 (20mg  BID, home dose 120mg  daily) - Will require uptitration now that mental status has improved  PCCM will sign off at this  time with plan for outpatient pulmonary f/u - thank you for involving [place] in this patient's care.  Best Practice: (right click and "Reselect all SmartList Selections" daily)   Per Primary Team  Critical care time: N/A   Lestine Mount MD [facility] 07/31/21 9:44 AM  Please see [email or web address] for pager details.  From 7A-7P if no response, please call [phone] After hours, please call ELink [phone]

## 2021-07-31 NOTE — Assessment & Plan Note (Addendum)
Associated aortic atherosclerosis. Continue Lipitor.

## 2021-07-31 NOTE — Assessment & Plan Note (Addendum)
Possible COPD with mild exacerbation however this was not clear. Patient was treated with Solu-medrol for possible COPD exacerbation. BiPAP initiated briefly for hypercapnia and was eventually discontinued. Patient was managed on supplemental oxygen which was weaned to room air. Respiratory failure resolved prior to discharge.

## 2021-07-31 NOTE — Telephone Encounter (Signed)
Lm for patient.  

## 2021-07-31 NOTE — Assessment & Plan Note (Addendum)
Baseline creatinine of about 1.1. Creatinine of 3..03 on admission. In setting of hypotension and dehydration secondary to nausea/vomiting and diarrhea. Patient treated with IV fluids with eventual improvement of AKI with creatinine down to 1.44 on day of discharge. Repeat BMP as an outpatient.

## 2021-07-31 NOTE — Assessment & Plan Note (Addendum)
Resume home amlodipine, lisinopril and hydrochlorothiazide.

## 2021-07-31 NOTE — Hospital Course (Signed)
71 y.o. male with medical history significant of anxiety, depression, diverticulosis, gout, hepatitis C, hyperlipidemia, hypertension, recurrent pneumonia, psychosis, renal cell carcinoma status post partial nephrectomy, vitamin D deficiency presented with worsening shortness of breath.  On presentation, patient was hypotensive and hypoxic requiring supplemental oxygen.  WBC 10.9, lactic acid was 1.6 and then 2.2, influenza and COVID testing were negative.  Creatinine was 3.03.  CT of abdomen/pelvis showed diffuse fluid level in bowel likely reflecting diarrheal illness/enteritis along with infrarenal AAA measuring up to 3.7 cm, follow-up recommended in 2 years.  He was started on IV fluids and antibiotics.  PCCM was consulted for hypoxia and hypotension.

## 2021-07-31 NOTE — Assessment & Plan Note (Addendum)
Patient should have surveillance imaging in 2 years.  Outpatient follow-up.

## 2021-07-31 NOTE — Assessment & Plan Note (Addendum)
Continue Paxil.  Outpatient follow-up with PCP/psychiatry

## 2021-08-01 DIAGNOSIS — I714 Abdominal aortic aneurysm, without rupture, unspecified: Secondary | ICD-10-CM

## 2021-08-01 DIAGNOSIS — K529 Noninfective gastroenteritis and colitis, unspecified: Secondary | ICD-10-CM

## 2021-08-01 LAB — COMPREHENSIVE METABOLIC PANEL
ALT: 16 U/L (ref 0–44)
AST: 25 U/L (ref 15–41)
Albumin: 3.7 g/dL (ref 3.5–5.0)
Alkaline Phosphatase: 62 U/L (ref 38–126)
Anion gap: 10 (ref 5–15)
BUN: 28 mg/dL — ABNORMAL HIGH (ref 8–23)
CO2: 25 mmol/L (ref 22–32)
Calcium: 9 mg/dL (ref 8.9–10.3)
Chloride: 105 mmol/L (ref 98–111)
Creatinine, Ser: 1.44 mg/dL — ABNORMAL HIGH (ref 0.61–1.24)
GFR, Estimated: 53 mL/min — ABNORMAL LOW (ref >60)
Glucose, Bld: 97 mg/dL (ref 70–99)
Potassium: 5.2 mmol/L — ABNORMAL HIGH (ref 3.5–5.1)
Sodium: 140 mmol/L (ref 135–145)
Total Bilirubin: 0.3 mg/dL (ref 0.3–1.2)
Total Protein: 6.9 g/dL (ref 6.5–8.1)

## 2021-08-01 LAB — CBC WITH DIFFERENTIAL/PLATELET
Abs Immature Granulocytes: 0.04 10*3/uL (ref 0.00–0.07)
Basophils Absolute: 0 10*3/uL (ref 0.0–0.1)
Basophils Relative: 0 %
Eosinophils Absolute: 0 10*3/uL (ref 0.0–0.5)
Eosinophils Relative: 0 %
HCT: 36.8 % — ABNORMAL LOW (ref 39.0–52.0)
Hemoglobin: 11.4 g/dL — ABNORMAL LOW (ref 13.0–17.0)
Immature Granulocytes: 1 %
Lymphocytes Relative: 9 %
Lymphs Abs: 0.7 10*3/uL (ref 0.7–4.0)
MCH: 30.2 pg (ref 26.0–34.0)
MCHC: 31 g/dL (ref 30.0–36.0)
MCV: 97.4 fL (ref 80.0–100.0)
Monocytes Absolute: 0.3 10*3/uL (ref 0.1–1.0)
Monocytes Relative: 4 %
Neutro Abs: 6.8 10*3/uL (ref 1.7–7.7)
Neutrophils Relative %: 86 %
Platelets: 184 10*3/uL (ref 150–400)
RBC: 3.78 MIL/uL — ABNORMAL LOW (ref 4.22–5.81)
RDW: 13.7 % (ref 11.5–15.5)
WBC: 7.8 10*3/uL (ref 4.0–10.5)
nRBC: 0 % (ref 0.0–0.2)

## 2021-08-01 LAB — MAGNESIUM: Magnesium: 1.9 mg/dL (ref 1.7–2.4)

## 2021-08-01 LAB — POTASSIUM: Potassium: 3.9 mmol/L (ref 3.5–5.1)

## 2021-08-01 NOTE — Discharge Instructions (Signed)
Jonathan Wilson, ? ?You were in the hospital with dehydration causing mild injury to your kidneys, in addition to respiratory issues causing low oxygen and high carbon dioxide levels. Your dehydration seems to be related to your recent illness which involved diarrhea and vomiting; thankfully that appears to have resolved. You received IV fluids to help with your dehydration. You also had some low oxygen but thankfully this resolved with some steroid therapy. Please follow-up with your primary care physician as an outpatient. We also discussed the importance of a colonoscopy and thankfully you have one scheduled for later this month. ?

## 2021-08-01 NOTE — Telephone Encounter (Signed)
Lestine Mount, PA-C ?to [facility] Triage Pool   ?SR ?  10:01 AM ?Please schedule patient for hospital follow up in 3-4 weeks; per Dr. Elsworth Soho ok for APP to see. Thank you!  ? ? ? ? ?Pt still admitted in hospital. With pt still admitted, appt for HFU has been scheduled. This will show up on pt's AVS once discharged. If appt needs to be cancelled/rescheduled, pt can call the office to get that taken care of. Nothing further needed. ?

## 2021-08-01 NOTE — Discharge Summary (Signed)
Physician Discharge Summary   Patient: Jonathan Wilson MRN: [id number] DOB: [birth year]  Admit date:     07/30/2021  Discharge date: 08/01/21  Discharge Physician: Cordelia Poche, MD   PCP: Vicenta Aly, FNP   Recommendations at discharge:   Outpatient BMP PCP follow-up AAA surveillance  Discharge Diagnoses: Principal Problem:   AKI (acute kidney injury) ([place]) Active Problems:   COPD (chronic obstructive pulmonary disease) ([place])   Essential hypertension   Hyperlipidemia   GAD (generalized anxiety disorder)   Hypotension   Acute gastroenteritis   Aortic atherosclerosis (HCC)   AAA (abdominal aortic aneurysm)   Nausea and vomiting   Dehydration   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[place])   Acute on chronic respiratory failure with hypoxia and hypercapnia (HCC)   Physical deconditioning   Hyperkalemia  Resolved Problems:   * No resolved hospital problems. *   Hospital Course: 71 y.o. male with medical history significant of anxiety, depression, diverticulosis, gout, hepatitis C, hyperlipidemia, hypertension, recurrent pneumonia, psychosis, renal cell carcinoma status post partial nephrectomy, vitamin D deficiency presented with worsening shortness of breath.  On presentation, patient was hypotensive and hypoxic requiring supplemental oxygen.  WBC 10.9, lactic acid was 1.6 and then 2.2, influenza and COVID testing were negative.  Creatinine was 3.03.  CT of abdomen/pelvis showed diffuse fluid level in bowel likely reflecting diarrheal illness/enteritis along with infrarenal AAA measuring up to 3.7 cm, follow-up recommended in 2 years.  He was started on IV fluids and antibiotics.  PCCM was consulted for hypoxia and hypotension.  Assessment and Plan: * AKI (acute kidney injury) ([place]) Baseline creatinine of about 1.1. Creatinine of 3..03 on admission. In setting of hypotension and dehydration secondary to nausea/vomiting and diarrhea. Patient treated with IV fluids with eventual  improvement of AKI with creatinine down to 1.44 on day of discharge. Repeat BMP as an outpatient.  Hyperkalemia Transient. Resolved spontaneously.  Physical deconditioning Physical therapy consulted but no needs identified.  Acute on chronic respiratory failure with hypoxia and hypercapnia (HCC) Possible COPD with mild exacerbation however this was not clear. Patient was treated with Solu-medrol for possible COPD exacerbation. BiPAP initiated briefly for hypercapnia and was eventually discontinued. Patient was managed on supplemental oxygen which was weaned to room air. Respiratory failure resolved prior to discharge.  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 Continue home methadone on discharge.  AAA (abdominal aortic aneurysm) Patient should have surveillance imaging in 2 years.  Outpatient follow-up.  GAD (generalized anxiety disorder) Continue Paxil.  Outpatient follow-up with PCP/psychiatry  Hyperlipidemia Associated aortic atherosclerosis. Continue Lipitor.  Essential hypertension Resume home amlodipine, lisinopril and hydrochlorothiazide.          Consultants: PCCM Procedures performed: BiPAP  Disposition: Home Diet recommendation:  Cardiac diet  DISCHARGE MEDICATION: Allergies as of 08/01/2021   No Known Allergies      Medication List     TAKE these medications    albuterol 108 (90 Base) MCG/ACT inhaler Commonly known as: VENTOLIN HFA Inhale 2 puffs into the lungs every 6 (six) hours as needed for shortness of breath.   amLODipine 5 MG tablet Commonly known as: NORVASC Take 5 mg by mouth daily.   aspirin EC 81 MG tablet Take 81 mg by mouth every morning. Swallow whole.   atorvastatin 40 MG tablet Commonly known as: LIPITOR Take 40 mg by mouth daily.   lisinopril-hydrochlorothiazide 20-12.5 MG tablet Commonly known as: ZESTORETIC Take 1 tablet by mouth daily.   METHADONE HCL PO Take 120 mg by  mouth daily.    multivitamin with minerals Tabs tablet Take 1 tablet by mouth every morning. Centrum   PARoxetine 40 MG tablet Commonly known as: PAXIL Take 40 mg by mouth daily.        Follow-up Information     Vicenta Aly, [place]. Schedule an appointment as soon as possible for a visit in 1 week(s).   Specialty: Nurse Practitioner Why: For hospital follow-up Contact information: [address] [place] [phone]                 Discharge Exam: Filed Weights   08/01/21 0500  Weight: 108.5 kg   General exam: Appears calm and comfortable Respiratory system: Clear to auscultation. Respiratory effort normal. Cardiovascular system: S1 & S2 heard, RRR. No murmurs, rubs, gallops or clicks. Gastrointestinal system: Abdomen is nondistended, soft and nontender. No organomegaly or masses felt. Normal bowel sounds heard. Central nervous system: Alert and oriented. No focal neurological deficits. Musculoskeletal: No edema. No calf tenderness Skin: No cyanosis. No rashes Psychiatry: Judgement and insight appear normal. Mood & affect appropriate.   Condition at discharge: stable  The results of significant diagnostics from this hospitalization (including imaging, microbiology, ancillary and laboratory) are listed below for reference.   Imaging Studies: CT Abdomen Pelvis Wo Contrast  Result Date: 07/30/2021 CLINICAL DATA:  Nausea and vomiting with abdominal pain.  Diarrhea. EXAM: CT ABDOMEN AND PELVIS WITHOUT CONTRAST TECHNIQUE: Multidetector CT imaging of the abdomen and pelvis was performed following the standard protocol without IV contrast. RADIATION DOSE REDUCTION: This exam was performed according to the departmental dose-optimization program which includes automated exposure control, adjustment of the mA and/or kV according to patient size and/or use of iterative reconstruction technique. COMPARISON:  07/06/2020 FINDINGS: Lower chest: No acute finding.  Aortic and coronary atherosclerosis. Hepatobiliary: No focal liver abnormality.Cholecystectomy. Pancreas: Generalized atrophy without acute or focal finding. Spleen: Unremarkable. Adrenals/Urinary Tract: Negative adrenals. Changes of partial nephrectomy at the upper pole right kidney. No masslike finding, collection, or hydronephrosis. Unremarkable bladder. Stomach/Bowel: Fluid level seen within the moderately distended stomach, within nondilated small bowel, and within the colon reaching the rectum. No obstructive pattern or bowel wall thickening. Normal appendix. Vascular/Lymphatic: No acute vascular abnormality. Atherosclerosis. Thick walled infrarenal aorta with 3.7 cm diameter, unchanged when remeasured in a similar fashion. No mass or adenopathy. Reproductive:No pathologic findings. Other: No ascites or pneumoperitoneum. Musculoskeletal: No acute abnormalities. IMPRESSION: 1. Diffuse fluid levels in bowel likely reflecting diarrheal illness/enteritis. No bowel wall thickening or obstruction. 2. Infrarenal aortic aneurysm measuring up to 3.7 cm with chronic mural thickening/inflammation. Recommend follow-up ultrasound every 2 years. This recommendation follows ACR consensus guidelines: White Paper of the ACR Incidental Findings Committee II on Vascular Findings. J Am Coll Radiol 2013; 41:660-630. 3. Aortic Atherosclerosis (ICD10-I70.0). Aortic aneurysm NOS (ICD10-I71.9). Electronically Signed   By: Jorje Guild M.D.   On: 07/30/2021 07:17   DG Chest Port 1 View  Result Date: 07/30/2021 CLINICAL DATA:  Shortness of breath. EXAM: PORTABLE CHEST 1 VIEW COMPARISON:  07/07/2020 FINDINGS: 0607 hours. The lungs are clear without focal pneumonia, edema, pneumothorax or pleural effusion. Minimal atelectasis noted right base. The cardiopericardial silhouette is within normal limits for size. The visualized bony structures of the thorax are unremarkable. Telemetry leads overlie the chest. IMPRESSION: Minimal right  base atelectasis. Otherwise no acute cardiopulmonary findings. Electronically Signed   By: Misty Stanley M.D.   On: 07/30/2021 06:24   US Abdomen Limited RUQ (LIVER/GB)  Result Date:  07/18/2021 CLINICAL DATA:  Cirrhosis of liver.  [place] screening. EXAM: ULTRASOUND ABDOMEN LIMITED RIGHT UPPER QUADRANT COMPARISON:  Abdominopelvic CT 07/06/2020, ultrasound 11/04/2019 FINDINGS: Gallbladder: Surgically absent. Common bile duct: Diameter: 6 mm, normal. Liver: Heterogeneous coarsened hepatic echogenicity. Overall mild increased parenchymal echogenicity. No discrete focal hepatic lesion. No capsular nodularity is seen. Portal vein is patent on color Doppler imaging with normal direction of blood flow towards the liver. Other: No right upper quadrant ascites. IMPRESSION: 1. Coarsened hepatic parenchymal echogenicity without focal hepatic lesion. 2. Post cholecystectomy without biliary dilatation. Electronically Signed   By: Keith Rake M.D.   On: 07/18/2021 12:32    Microbiology: Results for orders placed or performed during the hospital encounter of 07/30/21  Culture, blood (routine x 2)     Status: None (Preliminary result)   Collection Time: 07/30/21  5:46 AM   Specimen: BLOOD  Result Value Ref Range Status   Specimen Description   Final    BLOOD RIGHT ANTECUBITAL Performed at [facility] [address], [place], [place] [address]    Special Requests   Final    BOTTLES DRAWN AEROBIC AND ANAEROBIC Blood Culture adequate volume Performed at [facility] [address] [place], [place] [address]    Culture   Final    NO GROWTH < 24 HOURS Performed at [facility] [address] [place], [place] [address]    Report Status PENDING  Incomplete  Culture, blood (routine x 2)     Status: None (Preliminary result)   Collection Time: 07/30/21  6:31 AM   Specimen: BLOOD  Result Value Ref Range Status   Specimen Description   Final    BLOOD LEFT  ANTECUBITAL Performed at [facility] [address] [place], [place] [address]    Special Requests   Final    BOTTLES DRAWN AEROBIC AND ANAEROBIC Blood Culture adequate volume Performed at [facility] [address] [place], [place] [address]    Culture   Final    NO GROWTH < 24 HOURS Performed at [facility] [address], [place], [place] [address]    Report Status PENDING  Incomplete  Resp Panel by RT-PCR (Flu A&B, Covid) Nasopharyngeal Swab     Status: None   Collection Time: 07/30/21  9:00 AM   Specimen: Nasopharyngeal Swab; Nasopharyngeal(NP) swabs in vial transport medium  Result Value Ref Range Status   SARS Coronavirus 2 by RT PCR NEGATIVE NEGATIVE Final    Comment: (NOTE) SARS-CoV-2 target nucleic acids are NOT DETECTED.  The SARS-CoV-2 RNA is generally detectable in upper respiratory specimens during the acute phase of infection. The lowest concentration of SARS-CoV-2 viral copies this assay can detect is 138 copies/mL. A negative result does not preclude SARS-Cov-2 infection and should not be used as the sole basis for treatment or other patient management decisions. A negative result may occur with  improper specimen collection/handling, submission of specimen other than nasopharyngeal swab, presence of viral mutation(s) within the areas targeted by this assay, and inadequate number of viral copies(<138 copies/mL). A negative result must be combined with clinical observations, patient history, and epidemiological information. The expected result is Negative.  Fact Sheet for Patients:  EntrepreneurPulse.com.au  Fact Sheet for Healthcare Providers:  IncredibleEmployment.be  This test is no t yet approved or cleared by the [place] FDA and  has been authorized for detection and/or diagnosis of SARS-CoV-2 by FDA under an Emergency Use Authorization (  EUA). This EUA will remain   in effect (meaning this test can be used) for the duration of the COVID-19 declaration under Section 564(b)(1) of the Act, 21 U.S.C.section 360bbb-3(b)(1), unless the authorization is terminated  or revoked sooner.       Influenza A by PCR NEGATIVE NEGATIVE Final   Influenza B by PCR NEGATIVE NEGATIVE Final    Comment: (NOTE) The Xpert Xpress SARS-CoV-2/FLU/RSV plus assay is intended as an aid in the diagnosis of influenza from Nasopharyngeal swab specimens and should not be used as a sole basis for treatment. Nasal washings and aspirates are unacceptable for Xpert Xpress SARS-CoV-2/FLU/RSV testing.  Fact Sheet for Patients: EntrepreneurPulse.com.au  Fact Sheet for Healthcare Providers: IncredibleEmployment.be  This test is not yet approved or cleared by the [place] FDA and has been authorized for detection and/or diagnosis of SARS-CoV-2 by FDA under an Emergency Use Authorization (EUA). This EUA will remain in effect (meaning this test can be used) for the duration of the COVID-19 declaration under Section 564(b)(1) of the Act, 21 U.S.C. section 360bbb-3(b)(1), unless the authorization is terminated or revoked.  Performed at [facility], [place] [address] [place], [place] [address]   MRSA Next Gen by PCR, Nasal     Status: Abnormal   Collection Time: 07/30/21 10:56 AM   Specimen: Nasal Mucosa; Nasal Swab  Result Value Ref Range Status   MRSA by PCR Next Gen DETECTED (A) NOT DETECTED Final    Comment: (NOTE) The GeneXpert MRSA Assay (FDA approved for NASAL specimens only), is one component of a comprehensive MRSA colonization surveillance program. It is not intended to diagnose MRSA infection nor to guide or monitor treatment for MRSA infections. Test performance is not FDA approved in patients less than 69 years old. Performed at [facility], [place] [address] [place], [place] [address]      Labs: CBC: Recent Labs  Lab 07/30/21 0546 07/31/21 0302 08/01/21 0259  WBC 10.9* 10.3 7.8  NEUTROABS 8.3* 8.5* 6.8  HGB 14.3 10.6* 11.4*  HCT 45.7 33.6* 36.8*  MCV 95.4 96.3 97.4  PLT 254 161 774   Basic Metabolic Panel: Recent Labs  Lab 07/30/21 0546 07/30/21 0621 07/30/21 1143 07/31/21 0302 08/01/21 0259 08/01/21 1036  NA 138  --  136 139 140  --   K 4.4  --  4.4 4.8 5.2* 3.9  CL 97*  --  102 106 105  --   CO2 31  --  29 27 25   --   GLUCOSE 154*  --  131* 95 97  --   BUN 42*  --  43* 34* 28*  --   CREATININE 3.03*  --  2.72* 1.79* 1.44*  --   CALCIUM 9.7  --  8.1* 8.7* 9.0  --   MG  --  2.6*  --   --  1.9  --    Liver Function Tests: Recent Labs  Lab 07/30/21 0546 08/01/21 0259  AST 23 25  ALT 18 16  ALKPHOS 91 62  BILITOT 0.6 0.3  PROT 8.0 6.9  ALBUMIN 4.3 3.7    Discharge time spent: 35 minutes.  Signed: Cordelia Poche, MD [facility] 08/01/2021

## 2021-08-01 NOTE — Progress Notes (Signed)
PT Cancellation Note ? ?Patient Details ?Name: Jonathan Wilson ?MRN: [id number] ?DOB: [birth year] ? ? ?Cancelled Treatment:    Reason Eval/Treat Not Completed: PT screened, no needs identified, will sign off (Per RN, pt ambulated in the hallway without assistance and does not have mobility deficits. PT not needed, will sign off.) ? ? ?Blondell Reveal Kistler PT 08/01/2021  ?Acute Rehabilitation Services ?Pager [phone] ?Office [phone] ? ?

## 2021-08-01 NOTE — Progress Notes (Signed)
Discharge instructions given to patient at bedside. Patient verbalized understanding. Patient taken off of monitor and Ivs were removed. Patient was taken out of hospital by [facility] NT @ 1213. ?

## 2021-08-01 NOTE — Progress Notes (Signed)
Ambulated patient in hallway on room air. Patients oxygen saturation stayed above 95% for most of the walk. Patients oxygen got as low as 91% but this was with a bad pleath and patients oxygen recovered back up to 95% almost immediately.  ?

## 2021-08-04 DIAGNOSIS — E875 Hyperkalemia: Secondary | ICD-10-CM

## 2021-08-04 LAB — CULTURE, BLOOD (ROUTINE X 2)
Culture: NO GROWTH
Culture: NO GROWTH
Special Requests: ADEQUATE
Special Requests: ADEQUATE

## 2021-08-04 NOTE — Assessment & Plan Note (Signed)
Transient. Resolved spontaneously. ?

## 2021-08-28 ENCOUNTER — Other Ambulatory Visit: Payer: Self-pay

## 2021-08-28 ENCOUNTER — Ambulatory Visit (INDEPENDENT_AMBULATORY_CARE_PROVIDER_SITE_OTHER): Payer: Medicare Other | Admitting: Pulmonary Disease

## 2021-08-28 ENCOUNTER — Encounter: Payer: Self-pay | Admitting: Pulmonary Disease

## 2021-08-28 VITALS — BP 130/80 | HR 68 | Temp 98.1°F | Ht 75.0 in | Wt 223.6 lb

## 2021-08-28 DIAGNOSIS — J449 Chronic obstructive pulmonary disease, unspecified: Secondary | ICD-10-CM

## 2021-08-28 DIAGNOSIS — F17200 Nicotine dependence, unspecified, uncomplicated: Secondary | ICD-10-CM | POA: Diagnosis not present

## 2021-08-28 DIAGNOSIS — I7143 Infrarenal abdominal aortic aneurysm, without rupture: Secondary | ICD-10-CM | POA: Diagnosis not present

## 2021-08-28 DIAGNOSIS — J441 Chronic obstructive pulmonary disease with (acute) exacerbation: Secondary | ICD-10-CM | POA: Diagnosis not present

## 2021-08-28 NOTE — Progress Notes (Signed)
. ? ?Subjective:  ? ? Patient ID: Jonathan Wilson, male    DOB: 1950-10-25, 71 y.o.   MRN: [id number] ? ?HPI ? ?71 year old smoker admitted 2/27 to 3/1 for nausea, vomiting, diarrhea and hypotension with AKI .  VBG showed hypercarbia (7.19/90 )and placed on BiPAP ?CT abdomen/pelvis showed diffuse enteritis and 3.7 cm infrarenal AAA ?He was weaned off BiPAP and presents for outpatient follow-up today ?Bicarbonate on discharge 3/1 was 25 ? ?PMH - HTN, HLD, COPD (1 PPD smoker), RLL lung mass (resolved on f/u CT 08/26/2010), hepatitis C and EtOH related cirrhosis, renal CA status post partial right nephrectomy, diverticulosis, anxiety/depression.  ?Chronic opiate use on methadone '120mg'$  daily. ? ?Smoked > 1 PPD x 50 yrs ? ?He states he can walk about a mile without being short of breath denies wheezing or frequent chest colds.  An albuterol inhaler sent about a month ?He was able to quit for a few days but now has started smoking again back up to a pack a day ?I have reviewed discharge summary from hospital stay, imaging and labs ? ?Chest x-ray 2/27 showed mild hyperinflation, no infiltrates. ?CT abdomen/pelvis 07/30/2021 showed 3.7 cm infrarenal AAA ? ?Past Medical History:  ?Diagnosis Date  ? Anxiety   ? Anxiety and depression 1998-08-26  ? worse after death of wife 27-Mar-2013  ? Depression   ? Diverticulosis of colon 08/25/09  ? seen on CT scan in 2009-08-25.   ? Gout   ? Hepatitis C before 08/26/98  ? Hyperlipidemia   ? Hypertension   ? Influenza A 06/2013  ? acute resp failure, did not require intubation.  ? Obesity   ? 300 # in 08/25/2008, BMI 34 in 04/2013.   ? Pneumonia   ? recurrent episodes   ? Psychosis ([place]) 08-26-1998  ? s/e of treatment for Hepatitis C.    ? Renal cancer ([place])   ? Right lower lobe lung mass 05/2008  ? resolved on follow up CT 08/26/2010.   ? Vitamin D deficiency   ? ? ?Past Surgical History:  ?Procedure Laterality Date  ? CHOLECYSTECTOMY N/A 01/14/2014  ? Procedure: LAPAROSCOPIC CHOLECYSTECTOMY WITH INTRAOPERATIVE CHOLANGIOGRAM;  Surgeon:  Shann Medal, MD;  Location: [facility];  Service: General;  Laterality: N/A;  ? MANDIBLE FRACTURE SURGERY    ? ROBOTIC ASSITED PARTIAL NEPHRECTOMY Right 08/07/2015  ? Procedure: XI ROBOTIC ASSISTED RIGHT PARTIAL NEPHRECTOMY;  Surgeon: Cleon Gustin, MD;  Location: [facility];  Service: Urology;  Laterality: Right;  ? ? ?No Known Allergies ? ?Social History  ? ?Socioeconomic History  ? Marital status: Legally Separated  ?  Spouse name: Not on file  ? Number of children: 3  ? Years of education: Not on file  ? Highest education level: Not on file  ?Occupational History  ? Occupation: Employed: Architect work  ?  Employer: UNEMPLOYED  ?Tobacco Use  ? Smoking status: Former  ?  Packs/day: 0.25  ?  Years: 40.00  ?  Pack years: 10.00  ?  Types: Cigarettes  ? Smokeless tobacco: Never  ?Substance and Sexual Activity  ? Alcohol use: No  ?  Alcohol/week: 1.0 standard drink  ?  Types: 1 Cans of beer per week  ?  Comment: hx of etoh abuse- 12 years ago   ? Drug use: No  ?  Types: Heroin  ?  Comment: last use 12 years ago   ? Sexual activity: Never  ?Other Topics Concern  ? Not on file  ?Social History  Narrative  ? Widowed.  As of 01/2014 is living in appt with his brother-in-law.  Independent of ADLs.  ? ?Social Determinants of Health  ? ?Financial Resource Strain: Not on file  ?Food Insecurity: Not on file  ?Transportation Needs: Not on file  ?Physical Activity: Not on file  ?Stress: Not on file  ?Social Connections: Not on file  ?Intimate Partner Violence: Not on file  ? ? ?Family History  ?Problem Relation Age of Onset  ? Diabetes Mother   ? Arthritis Mother   ? Cancer Father   ?     pancreatic cancer  ? ? ? ? ? ? ?Review of Systems ?Constitutional: negative for anorexia, fevers and sweats  ?Eyes: negative for irritation, redness and visual disturbance  ?Ears, nose, mouth, throat, and face: negative for earaches, epistaxis, nasal congestion and sore throat  ?Respiratory: negative for cough, dyspnea on exertion, sputum and  wheezing  ?Cardiovascular: negative for chest pain, dyspnea, lower extremity edema, orthopnea, palpitations and syncope  ?Gastrointestinal: negative for abdominal pain, constipation, diarrhea, melena, nausea and vomiting  ?Genitourinary:negative for dysuria, frequency and hematuria  ?Hematologic/lymphatic: negative for bleeding, easy bruising and lymphadenopathy  ?Musculoskeletal:negative for arthralgias, muscle weakness and stiff joints  ?Neurological: negative for coordination problems, gait problems, headaches and weakness  ?Endocrine: negative for diabetic symptoms including polydipsia, polyuria and weight loss ? ?   ?Objective:  ? Physical Exam ? ?Gen. Pleasant, well-nourished, in no distress, normal affect ?ENT - no pallor,icterus, no post nasal drip ?Neck: No JVD, no thyromegaly, no carotid bruits ?Lungs: Bilateral chest, no use of accessory muscles, no dullness to percussion, clear without rales or rhonchi  ?Cardiovascular: Rhythm regular, heart sounds  normal, no murmurs or gallops, no peripheral edema ?Abdomen: soft and non-tender, no hepatosplenomegaly, BS normal. ?Musculoskeletal: No deformities, no cyanosis or clubbing ?Neuro:  alert, non focal ? ? ? ?   ?Assessment & Plan:  ? ? ?

## 2021-08-28 NOTE — Assessment & Plan Note (Signed)
3.7 cm infrarenal AAA noted.  He will need 51-monthfollow-up ultrasound and will defer to PCP for this ?

## 2021-08-28 NOTE — Assessment & Plan Note (Signed)
He had transient hypercarbia during his hospital stay for hypotension due to enteritis and dehydration.  On discharge bicarbonate was 25. ?It is very likely that he has underlying COPD due to his heavy history of smoking. ?We will undertake PFTs to assess.  He is not very enthusiastic about starting a maintenance inhaler.  He prefers to continue using albuterol for now ?We will also refer him to a lung cancer screening program ?

## 2021-08-28 NOTE — Patient Instructions (Addendum)
?  X schedule pFTs ? ?X Lung cancer screening referral ? ?QUIT smoking !!! ? ?You have AAA 3.7 cm below the kidneys - Please arrange 6 month follow up ultrasound with your PCP ?

## 2021-08-28 NOTE — Assessment & Plan Note (Signed)
Smoking cessation was emphasized is the most important intervention that would add years to his life.  We discussed nicotine supplements but he prefers to quit cold [place].  He states that learning what smoking is done to his lungs  is helpful for him to quit ?

## 2021-10-10 ENCOUNTER — Other Ambulatory Visit: Payer: Self-pay

## 2021-10-10 DIAGNOSIS — F17210 Nicotine dependence, cigarettes, uncomplicated: Secondary | ICD-10-CM

## 2021-10-10 DIAGNOSIS — Z122 Encounter for screening for malignant neoplasm of respiratory organs: Secondary | ICD-10-CM

## 2021-10-10 DIAGNOSIS — Z87891 Personal history of nicotine dependence: Secondary | ICD-10-CM

## 2021-10-22 ENCOUNTER — Encounter: Payer: Self-pay | Admitting: Acute Care

## 2021-10-22 ENCOUNTER — Ambulatory Visit (INDEPENDENT_AMBULATORY_CARE_PROVIDER_SITE_OTHER): Payer: Medicare Other | Admitting: Acute Care

## 2021-10-22 DIAGNOSIS — F17210 Nicotine dependence, cigarettes, uncomplicated: Secondary | ICD-10-CM

## 2021-10-22 NOTE — Patient Instructions (Signed)
Thank you for participating in the [facility]. It was our pleasure to meet you today. We will call you with the results of your scan within the next few days. Your scan will be assigned a Lung RADS category score by the physicians reading the scans.  This Lung RADS score determines follow up scanning.  See below for description of categories, and follow up screening recommendations. We will be in touch to schedule your follow up screening annually or based on recommendations of our providers. We will fax a copy of your scan results to your Primary Care Physician, or the physician who referred you to the program, to ensure they have the results. Please call the office if you have any questions or concerns regarding your scanning experience or results.  Our office number is [phone]. Please speak with Denise Phelps, RN. , or  Denise Buckner RN, They are  our Lung Cancer Screening RN.'s If They are unavailable when you call, Please leave a message on the voice mail. We will return your call at our earliest convenience.This voice mail is monitored several times a day.  Remember, if your scan is normal, we will scan you annually as long as you continue to meet the criteria for the program. (Age 55-77, Current smoker or smoker who has quit within the last 15 years). If you are a smoker, remember, quitting is the single most powerful action that you can take to decrease your risk of lung cancer and other pulmonary, breathing related problems. We know quitting is hard, and we are here to help.  Please let us know if there is anything we can do to help you meet your goal of quitting. If you are a former smoker, congratulations. We are proud of you! Remain smoke free! Remember you can refer friends or family members through the number above.  We will screen them to make sure they meet criteria for the program. Thank you for helping us take better care of you by  participating in Lung Screening.  You can receive free nicotine replacement therapy ( patches, gum or mints) by calling 1-800-QUIT NOW. Please call so we can get you on the path to becoming  a non-smoker. I know it is hard, but you can do this!  Lung RADS Categories:  Lung RADS 1: no nodules or definitely non-concerning nodules.  Recommendation is for a repeat annual scan in 12 months.  Lung RADS 2:  nodules that are non-concerning in appearance and behavior with a very low likelihood of becoming an active cancer. Recommendation is for a repeat annual scan in 12 months.  Lung RADS 3: nodules that are probably non-concerning , includes nodules with a low likelihood of becoming an active cancer.  Recommendation is for a 6-month repeat screening scan. Often noted after an upper respiratory illness. We will be in touch to make sure you have no questions, and to schedule your 6-month scan.  Lung RADS 4 A: nodules with concerning findings, recommendation is most often for a follow up scan in 3 months or additional testing based on our provider's assessment of the scan. We will be in touch to make sure you have no questions and to schedule the recommended 3 month follow up scan.  Lung RADS 4 B:  indicates findings that are concerning. We will be in touch with you to schedule additional diagnostic testing based on our provider's  assessment of the scan.  Other options for assistance in smoking cessation (   As covered by your insurance benefits)  Hypnosis for smoking cessation  [employer] [phone]  Acupuncture for smoking cessation  [facility] [phone]   

## 2021-10-22 NOTE — Progress Notes (Signed)
Virtual Visit via Telephone Note  I connected with Jonathan Wilson on 04/17/21 at  2:00 PM EST by telephone and verified that I am speaking with the correct person using two identifiers.  Location: Patient: Home Provider: Working from home   I discussed the limitations, risks, security and privacy concerns of performing an evaluation and management service by telephone and the availability of in person appointments. I also discussed with the patient that there may be a patient responsible charge related to this service. The patient expressed understanding and agreed to proceed.  Shared Decision Making Visit Lung Cancer Screening Program [phone])   Eligibility: Age 71 y.o. Pack Years Smoking History Calculation 81 (# packs/per year x # years smoked) Recent History of coughing up blood  no Unexplained weight loss? no ( >Than 15 pounds within the last 6 months ) Prior History Lung / other cancer no (Diagnosis within the last 5 years already requiring surveillance chest CT Scans). Smoking Status Current Smoker Former Smokers: Years since quit: NA  Quit Date: NA  Visit Components: Discussion included one or more decision making aids. yes Discussion included risk/benefits of screening. yes Discussion included potential follow up diagnostic testing for abnormal scans. yes Discussion included meaning and risk of over diagnosis. yes Discussion included meaning and risk of False Positives. yes Discussion included meaning of total radiation exposure. yes  Counseling Included: Importance of adherence to annual lung cancer LDCT screening. yes Impact of comorbidities on ability to participate in the program. yes Ability and willingness to under diagnostic treatment. yes  Smoking Cessation Counseling: Current Smokers:  Discussed importance of smoking cessation. yes Information about tobacco cessation classes and interventions provided to patient. yes Patient provided with "ticket" for LDCT  Scan. yes Symptomatic Patient. yes  Counseling(Intermediate counseling: > three minutes) 99406 Diagnosis Code: Tobacco Use Z72.0 Asymptomatic Patient no  Counseling NA Former Smokers:  Discussed the importance of maintaining cigarette abstinence. yes Diagnosis Code: Personal History of Nicotine Dependence. J09.326 Information about tobacco cessation classes and interventions provided to patient. Yes Patient provided with "ticket" for LDCT Scan. yes Written Order for Lung Cancer Screening with LDCT placed in Epic. Yes (CT Chest Lung Cancer Screening Low Dose W/O CM) [id number] Z12.2-Screening of respiratory organs Z87.891-Personal history of nicotine dependence   I spent 25 minutes of face to face time with him discussing the risks and benefits of lung cancer screening. We viewed a power point together that explained in detail the above noted topics. We took the time to pause the power point at intervals to allow for questions to be asked and answered to ensure understanding. We discussed that he had taken the single most powerful action possible to decrease his risk of developing lung cancer when he quit smoking. I counseled him to remain smoke free, and to contact me if he ever had the desire to smoke again so that I can provide resources and tools to help support the effort to remain smoke free. We discussed the time and location of the scan, and that either  Doroteo Glassman RN or I will call with the results within  24-48 hours of receiving them. He has my card and contact information in the event he needs to speak with me, in addition to a copy of the power point we reviewed as a resource. He verbalized understanding of all of the above and had no further questions upon leaving the office.     I explained to the patient that there has been a  high incidence of coronary artery disease noted on these exams. I explained that this is a non-gated exam therefore degree or severity cannot be determined.  This patient is on statin therapy. I have asked the patient to follow-up with their PCP regarding any incidental finding of coronary artery disease and management with diet or medication as they feel is clinically indicated. The patient verbalized understanding of the above and had no further questions.   I spent 3 minutes counseling on smoking cessation and the health risks of continued tobacco abuse    Leila Schuff D. Kenton Kingfisher, NP-C [facility] Personal contact information can be found on Amion  10/22/2021, 9:42 AM

## 2021-10-25 ENCOUNTER — Ambulatory Visit
Admission: RE | Admit: 2021-10-25 | Discharge: 2021-10-25 | Disposition: A | Discharge status: Home / Self Care | Payer: Medicare Other | Source: Ambulatory Visit | Attending: Nurse Practitioner | Admitting: Nurse Practitioner

## 2021-10-25 DIAGNOSIS — Z122 Encounter for screening for malignant neoplasm of respiratory organs: Secondary | ICD-10-CM

## 2021-10-25 DIAGNOSIS — Z87891 Personal history of nicotine dependence: Secondary | ICD-10-CM

## 2021-10-25 DIAGNOSIS — F17210 Nicotine dependence, cigarettes, uncomplicated: Secondary | ICD-10-CM

## 2021-10-30 ENCOUNTER — Other Ambulatory Visit: Payer: Self-pay | Admitting: Acute Care

## 2021-10-30 DIAGNOSIS — F17210 Nicotine dependence, cigarettes, uncomplicated: Secondary | ICD-10-CM

## 2021-10-30 DIAGNOSIS — Z87891 Personal history of nicotine dependence: Secondary | ICD-10-CM

## 2021-10-30 DIAGNOSIS — Z122 Encounter for screening for malignant neoplasm of respiratory organs: Secondary | ICD-10-CM

## 2021-12-31 ENCOUNTER — Ambulatory Visit: Payer: Medicare Other | Admitting: Pulmonary Disease

## 2022-02-12 ENCOUNTER — Ambulatory Visit: Payer: Medicare Other | Admitting: Pulmonary Disease

## 2022-03-25 ENCOUNTER — Ambulatory Visit (INDEPENDENT_AMBULATORY_CARE_PROVIDER_SITE_OTHER): Payer: Medicare Other | Admitting: Primary Care

## 2022-03-25 ENCOUNTER — Encounter: Payer: Self-pay | Admitting: Primary Care

## 2022-03-25 VITALS — BP 130/78 | HR 70 | Temp 98.3°F | Ht 74.0 in | Wt 223.2 lb

## 2022-03-25 DIAGNOSIS — J441 Chronic obstructive pulmonary disease with (acute) exacerbation: Secondary | ICD-10-CM | POA: Diagnosis not present

## 2022-03-25 DIAGNOSIS — J449 Chronic obstructive pulmonary disease, unspecified: Secondary | ICD-10-CM | POA: Diagnosis not present

## 2022-03-25 MED ORDER — STIOLTO RESPIMAT 2.5-2.5 MCG/ACT IN AERS: 2.0000 | INHALATION_SPRAY | Freq: Every day | RESPIRATORY_TRACT | 0 refills | Status: DC

## 2022-03-25 NOTE — Patient Instructions (Addendum)
PFTs today confirmed COPD, since you had difficulty it was unable to determine exact stage We will schedule full pulmonary function testing Start Stiolto Respimat- take 2 puffs every morning x 2 weeks Continue Albuterol 2 puffs every 4-6 hours for breakthrough sob/wheezing   Follow-up: 2 weeks virtual visit with Beth Np (or in office)  Chronic Obstructive Pulmonary Disease  Chronic obstructive pulmonary disease (COPD) is a long-term (chronic) lung problem. When you have COPD, it is hard for air to get in and out of your lungs. Usually the condition gets worse over time, and your lungs will never return to normal. There are things you can do to keep yourself as healthy as possible. What are the causes? Smoking. This is the most common cause. Certain genes passed from parent to child (inherited). What increases the risk? Being exposed to secondhand smoke from cigarettes, pipes, or cigars. Being exposed to chemicals and other irritants, such as fumes and dust in the work environment. Having chronic lung conditions or infections. What are the signs or symptoms? Shortness of breath, especially during physical activity. A long-term cough with a large amount of thick mucus. Sometimes, the cough may not have any mucus (dry cough). Wheezing. Breathing quickly. Skin that looks gray or blue, especially in the fingers, toes, or lips. Feeling tired (fatigue). Weight loss. Chest tightness. Having infections often. Episodes when breathing symptoms become much worse (exacerbations). At the later stages of this disease, you may have swelling in the ankles, feet, or legs. How is this treated? Taking medicines. Quitting smoking, if you smoke. Rehabilitation. This includes steps to make your body work better. It may involve a team of specialists. Doing exercises. Making changes to your diet. Using oxygen. Lung surgery. Lung transplant. Comfort measures (palliative care). Follow these  instructions at home: Medicines Take over-the-counter and prescription medicines only as told by your doctor. Talk to your doctor before taking any cough or allergy medicines. You may need to avoid medicines that cause your lungs to be dry. Lifestyle If you smoke, stop smoking. Smoking makes the problem worse. Do not smoke or use any products that contain nicotine or tobacco. If you need help quitting, ask your doctor. Avoid being around things that make your breathing worse. This may include smoke, chemicals, and fumes. Stay active, but remember to rest as well. Learn and use tips on how to manage stress and control your breathing. Make sure you get enough sleep. Most adults need at least 7 hours of sleep every night. Eat healthy foods. Eat smaller meals more often. Rest before meals. Controlled breathing Learn and use tips on how to control your breathing as told by your doctor. Try: Breathing in (inhaling) through your nose for 1 second. Then, pucker your lips and breath out (exhale) through your lips for 2 seconds. Putting one hand on your belly (abdomen). Breathe in slowly through your nose for 1 second. Your hand on your belly should move out. Pucker your lips and breathe out slowly through your lips. Your hand on your belly should move in as you breathe out.  Controlled coughing Learn and use controlled coughing to clear mucus from your lungs. Follow these steps: Lean your head a little forward. Breathe in deeply. Try to hold your breath for 3 seconds. Keep your mouth slightly open while coughing 2 times. Spit any mucus out into a tissue. Rest and do the steps again 1 or 2 times as needed. General instructions Make sure you get all the shots (vaccines) that  your doctor recommends. Ask your doctor about a flu shot and a pneumonia shot. Use oxygen therapy and pulmonary rehabilitation if told by your doctor. If you need home oxygen therapy, ask your doctor if you should buy a tool to  measure your oxygen level (oximeter). Make a COPD action plan with your doctor. This helps you to know what to do if you feel worse than usual. Manage any other conditions you have as told by your doctor. Avoid going outside when it is very hot, cold, or humid. Avoid people who have a sickness you can catch (contagious). Keep all follow-up visits. Contact a doctor if: You cough up more mucus than usual. There is a change in the color or thickness of the mucus. It is harder to breathe than usual. Your breathing is faster than usual. You have trouble sleeping. You need to use your medicines more often than usual. You have trouble doing your normal activities such as getting dressed or walking around the house. Get help right away if: You have shortness of breath while resting. You have shortness of breath that stops you from: Being able to talk. Doing normal activities. Your chest hurts for longer than 5 minutes. Your skin color is more blue than usual. Your pulse oximeter shows that you have low oxygen for longer than 5 minutes. You have a fever. You feel too tired to breathe normally. These symptoms may represent a serious problem that is an emergency. Do not wait to see if the symptoms will go away. Get medical help right away. Call your local emergency services (911 in the [place] Do not drive yourself to the hospital. Summary Chronic obstructive pulmonary disease (COPD) is a long-term lung problem. The way your lungs work will never return to normal. Usually the condition gets worse over time. There are things you can do to keep yourself as healthy as possible. Take over-the-counter and prescription medicines only as told by your doctor. If you smoke, stop. Smoking makes the problem worse. This information is not intended to replace advice given to you by your health care provider. Make sure you discuss any questions you have with your health care provider. Document Revised:  03/28/2020 Document Reviewed: 03/28/2020 Elsevier Patient Education  [place].

## 2022-03-25 NOTE — Progress Notes (Signed)
$'@Patient'r$  ID: Jonathan Wilson, male    DOB: 1950-06-17, 71 y.o.   MRN: [id number]  No chief complaint on file.   Referring provider: Vicenta Aly, FNP  HPI: 71 year old smoker admitted 2/27 to 3/1 for nausea, vomiting, diarrhea and hypotension with AKI .  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  Chronic opiate use on methadone '120mg'$  daily.  Smoked > 1 PPD x 50 yrs  He states he can walk about a mile without being short of breath denies wheezing or frequent chest colds.  An albuterol inhaler sent about a month He was able to quit for a few days but now has started smoking again back up to a pack a day I have reviewed discharge summary from hospital stay, imaging and labs  Chest x-ray 2/27 showed mild hyperinflation, no infiltrates. CT abdomen/pelvis 07/30/2021 showed 3.7 cm infrarenal AAA  03/25/2022- Interim hx  Patient presents today for 70 month follow-up.  Breathing has been worse last couple of weeks. He gets out of breath with exertion. No acute or chronic cough. He is a current smoker, he has cut back to 1 pack a week. He is not on any maintenance inhalers. He has no PFTs on file. He would like something to help him breath better. He uses albuterol 3-4 times a day. Denies chest tightness, wheezing.   No Known Allergies  Immunization History  Administered Date(s) Administered   Hep A / Hep B 09/24/2013   Influenza Whole 03/07/2009   Influenza, High Dose Seasonal PF 06/08/2018, 06/29/2020, 03/05/2021   Influenza, Seasonal, Injecte, Preservative Fre 05/24/2015   Influenza,inj,Quad PF,6+ Mos  05/24/2015, 03/16/2019   Influenza-Unspecified 05/24/2015   Moderna Sars-Covid-2 Vaccination 05/07/2020   PFIZER(Purple Top)SARS-COV-2 Vaccination 08/02/2019, 08/23/2019, 03/05/2021   Pneumococcal Polysaccharide-23 01/13/2014   Td 06/03/2000   Td (Adult), 2 Lf Tetanus Toxid, Preservative Free 06/03/2000   Tdap 06/04/2013    Past Medical History:  Diagnosis Date   Anxiety    Anxiety and depression 28-Aug-1998   worse after death of wife 03-29-13   Depression    Diverticulosis of colon 08/27/2009   seen on CT scan in 08-27-2009.    Gout    Hepatitis C before Aug 28, 1998   Hyperlipidemia    Hypertension    Influenza A 06/2013   acute resp failure, did not require intubation.   Obesity    300 # in 2008-08-27, BMI 34 in 04/2013.    Pneumonia    recurrent episodes    Psychosis ([place]) 08/28/98   s/e of treatment for Hepatitis C.     Renal cancer ([place])    Right lower lobe lung mass 05/2008   resolved on follow up CT 08-28-10.    Vitamin D deficiency     Tobacco History: Social History   Tobacco Use  Smoking Status Former   Packs/day: 0.25   Years: 50.00   Total pack years: 12.50   Types: Cigarettes  Smokeless Tobacco Never  Tobacco Comments   Patient recently cut back on amount. Prior patient was smoking upwards of  2PPD leading to a 81pack per year history    Counseling given: Not Answered Tobacco  comments: Patient recently cut back on amount. Prior patient was smoking upwards of  2PPD leading to a 81pack per year history    Outpatient Medications Prior to Visit  Medication Sig Dispense Refill   albuterol (VENTOLIN HFA) 108 (90 Base) MCG/ACT inhaler Inhale 2 puffs into the lungs every 6 (six) hours as needed for shortness of breath.     amLODipine (NORVASC) 5 MG tablet Take 5 mg by mouth daily.     aspirin EC 81 MG tablet Take 81 mg by mouth every morning. Swallow whole.     atorvastatin (LIPITOR) 40 MG tablet Take 40 mg by mouth daily.     lisinopril-hydrochlorothiazide (ZESTORETIC) 20-12.5 MG tablet Take  1 tablet by mouth daily.     METHADONE HCL PO Take 120 mg by mouth daily.     Multiple Vitamin (MULTIVITAMIN WITH MINERALS) TABS tablet Take 1 tablet by mouth every morning. Centrum     PARoxetine (PAXIL) 40 MG tablet Take 40 mg by mouth daily.     No facility-administered medications prior to visit.   Review of Systems  Review of Systems  Constitutional: Negative.  Negative for fatigue.  HENT: Negative.    Respiratory:  Positive for shortness of breath. Negative for cough, chest tightness and wheezing.   Cardiovascular: Negative.    Physical Exam  BP 130/78 (BP Location: Right Arm, Patient Position: Sitting, Cuff Size: Normal)   Pulse 70   Temp 98.3 F (36.8 C) (Oral)   Ht '6\' 2"'$  (1.88 m)   Wt 223 lb 3.2 oz (101.2 kg)   SpO2 98%   BMI 28.66 kg/m  Physical Exam Constitutional:      Appearance: Normal appearance.  HENT:     Head: Normocephalic and atraumatic.     Mouth/Throat:     Mouth: Mucous membranes are moist.     Pharynx: Oropharynx is clear.  Cardiovascular:     Rate and Rhythm: Normal rate and regular rhythm.  Pulmonary:     Effort: Pulmonary effort is normal.     Breath sounds: Normal breath sounds. No wheezing, rhonchi or rales.     Comments: Isolated wheeze left upper lobe, diminished lungs bases  Musculoskeletal:        General: Normal range of motion.  Skin:    General: Skin is warm and dry.  Neurological:     General: No focal deficit present.     Mental Status: He is alert and oriented to person, place, and time. Mental status is at baseline.  Psychiatric:        Mood and Affect: Mood normal.        Behavior: Behavior normal.        Thought Content: Thought content normal.        Judgment: Judgment normal.      Lab Results:  CBC    Component Value Date/Time   WBC 7.8 08/01/2021 0259   RBC 3.78 (L) 08/01/2021 0259   HGB 11.4 (L) 08/01/2021 0259   HCT 36.8 (L) 08/01/2021 0259   PLT 184 08/01/2021 0259   MCV 97.4 08/01/2021 0259   MCH 30.2  08/01/2021 0259   MCHC 31.0 08/01/2021 0259   RDW 13.7 08/01/2021 0259   LYMPHSABS 0.7 08/01/2021 0259   MONOABS 0.3 08/01/2021 0259   EOSABS 0.0 08/01/2021 0259   BASOSABS 0.0 08/01/2021 0259    BMET    Component Value Date/Time   NA 140 08/01/2021 0259   K 3.9 08/01/2021 1036   CL 105 08/01/2021  0259   CO2 25 08/01/2021 0259   GLUCOSE 97 08/01/2021 0259   BUN 28 (H) 08/01/2021 0259   CREATININE 1.44 (H) 08/01/2021 0259   CREATININE 0.98 07/16/2013 1038   CALCIUM 9.0 08/01/2021 0259   GFRNONAA 53 (L) 08/01/2021 0259   GFRAA 52 (L) 01/22/2019 1802    BNP    Component Value Date/Time   BNP 58.1 01/22/2019 1840    ProBNP    Component Value Date/Time   PROBNP 2,813.0 (H) 06/21/2013 0908    Imaging: No results found.   Assessment & Plan:   COPD (chronic obstructive pulmonary disease) ([place]) - Current occasional smoker. Presumed COPD, no formal PFTs on file. Spirometry in office today confirmed obstructive defect, unable to interpret severity. We will give him a sample of Stiolto Respimat 2 puffs once daily. Needs pulmonary function testing scheduled. DME order placed for patient to be provided incentive spirometer to entourage deep breathing exercises. Continue to encourage smoking cessation. FU in 2 weeks with [facility] NP.    Martyn Ehrich, NP 03/25/2022

## 2022-03-25 NOTE — Assessment & Plan Note (Addendum)
-   Current occasional smoker. Presumed COPD, no formal PFTs on file. Spirometry in office today confirmed obstructive defect, unable to interpret severity. We will give him a sample of Stiolto Respimat 2 puffs once daily. Needs pulmonary function testing scheduled. DME order placed for patient to be provided incentive spirometer to entourage deep breathing exercises. Continue to encourage smoking cessation. FU in 2 weeks with [facility] NP.

## 2022-03-28 ENCOUNTER — Other Ambulatory Visit: Payer: Self-pay | Admitting: Nurse Practitioner

## 2022-03-28 DIAGNOSIS — K7469 Other cirrhosis of liver: Secondary | ICD-10-CM

## 2022-03-29 ENCOUNTER — Inpatient Hospital Stay: Admission: RE | Admit: 2022-03-29 | Payer: Medicare Other | Source: Ambulatory Visit

## 2022-04-03 ENCOUNTER — Ambulatory Visit: Payer: Medicare Other | Admitting: Podiatry

## 2022-04-05 ENCOUNTER — Inpatient Hospital Stay: Admission: RE | Admit: 2022-04-05 | Payer: Medicare Other | Source: Ambulatory Visit

## 2022-04-11 ENCOUNTER — Ambulatory Visit: Payer: Medicare Other | Admitting: Podiatry

## 2022-04-22 ENCOUNTER — Inpatient Hospital Stay (HOSPITAL_COMMUNITY)
Admission: EM | Admit: 2022-04-22 | Discharge: 2022-04-26 | DRG: 191 | Disposition: A | Discharge status: Home / Self Care | Payer: Medicare Other | Attending: Family Medicine | Admitting: Family Medicine

## 2022-04-22 ENCOUNTER — Emergency Department (HOSPITAL_COMMUNITY): Payer: Medicare Other

## 2022-04-22 ENCOUNTER — Encounter (HOSPITAL_COMMUNITY): Payer: Self-pay | Admitting: Emergency Medicine

## 2022-04-22 ENCOUNTER — Other Ambulatory Visit: Payer: Self-pay

## 2022-04-22 DIAGNOSIS — E860 Dehydration: Secondary | ICD-10-CM | POA: Diagnosis present

## 2022-04-22 DIAGNOSIS — Z1152 Encounter for screening for COVID-19: Secondary | ICD-10-CM

## 2022-04-22 DIAGNOSIS — R519 Headache, unspecified: Secondary | ICD-10-CM | POA: Diagnosis present

## 2022-04-22 DIAGNOSIS — J441 Chronic obstructive pulmonary disease with (acute) exacerbation: Principal | ICD-10-CM | POA: Diagnosis present

## 2022-04-22 DIAGNOSIS — F1120 Opioid dependence, uncomplicated: Secondary | ICD-10-CM | POA: Diagnosis present

## 2022-04-22 DIAGNOSIS — F1190 Opioid use, unspecified, uncomplicated: Secondary | ICD-10-CM | POA: Diagnosis present

## 2022-04-22 DIAGNOSIS — J449 Chronic obstructive pulmonary disease, unspecified: Secondary | ICD-10-CM | POA: Diagnosis present

## 2022-04-22 DIAGNOSIS — Z87891 Personal history of nicotine dependence: Secondary | ICD-10-CM

## 2022-04-22 DIAGNOSIS — Z85528 Personal history of other malignant neoplasm of kidney: Secondary | ICD-10-CM

## 2022-04-22 DIAGNOSIS — N1832 Chronic kidney disease, stage 3b: Secondary | ICD-10-CM | POA: Diagnosis present

## 2022-04-22 DIAGNOSIS — Z7982 Long term (current) use of aspirin: Secondary | ICD-10-CM

## 2022-04-22 DIAGNOSIS — E8729 Other acidosis: Secondary | ICD-10-CM | POA: Diagnosis present

## 2022-04-22 DIAGNOSIS — B1920 Unspecified viral hepatitis C without hepatic coma: Secondary | ICD-10-CM | POA: Diagnosis present

## 2022-04-22 DIAGNOSIS — E875 Hyperkalemia: Secondary | ICD-10-CM | POA: Diagnosis present

## 2022-04-22 DIAGNOSIS — I10 Essential (primary) hypertension: Secondary | ICD-10-CM | POA: Diagnosis present

## 2022-04-22 DIAGNOSIS — R110 Nausea: Secondary | ICD-10-CM

## 2022-04-22 DIAGNOSIS — R079 Chest pain, unspecified: Secondary | ICD-10-CM | POA: Diagnosis present

## 2022-04-22 DIAGNOSIS — I129 Hypertensive chronic kidney disease with stage 1 through stage 4 chronic kidney disease, or unspecified chronic kidney disease: Secondary | ICD-10-CM | POA: Diagnosis present

## 2022-04-22 DIAGNOSIS — Z8261 Family history of arthritis: Secondary | ICD-10-CM

## 2022-04-22 DIAGNOSIS — E872 Acidosis, unspecified: Secondary | ICD-10-CM | POA: Diagnosis present

## 2022-04-22 DIAGNOSIS — Z8507 Personal history of malignant neoplasm of pancreas: Secondary | ICD-10-CM

## 2022-04-22 DIAGNOSIS — Z833 Family history of diabetes mellitus: Secondary | ICD-10-CM

## 2022-04-22 DIAGNOSIS — E785 Hyperlipidemia, unspecified: Secondary | ICD-10-CM | POA: Diagnosis present

## 2022-04-22 DIAGNOSIS — Z79899 Other long term (current) drug therapy: Secondary | ICD-10-CM

## 2022-04-22 DIAGNOSIS — R0602 Shortness of breath: Secondary | ICD-10-CM | POA: Diagnosis not present

## 2022-04-22 DIAGNOSIS — I4510 Unspecified right bundle-branch block: Secondary | ICD-10-CM | POA: Diagnosis present

## 2022-04-22 DIAGNOSIS — Z8619 Personal history of other infectious and parasitic diseases: Secondary | ICD-10-CM

## 2022-04-22 DIAGNOSIS — K7460 Unspecified cirrhosis of liver: Secondary | ICD-10-CM | POA: Diagnosis present

## 2022-04-22 DIAGNOSIS — I7143 Infrarenal abdominal aortic aneurysm, without rupture: Secondary | ICD-10-CM | POA: Diagnosis present

## 2022-04-22 DIAGNOSIS — N179 Acute kidney failure, unspecified: Secondary | ICD-10-CM | POA: Diagnosis present

## 2022-04-22 LAB — CBC WITH DIFFERENTIAL/PLATELET
Abs Immature Granulocytes: 0.02 10*3/uL (ref 0.00–0.07)
Basophils Absolute: 0 10*3/uL (ref 0.0–0.1)
Basophils Relative: 0 %
Eosinophils Absolute: 0.1 10*3/uL (ref 0.0–0.5)
Eosinophils Relative: 1 %
HCT: 45.4 % (ref 39.0–52.0)
Hemoglobin: 15.3 g/dL (ref 13.0–17.0)
Immature Granulocytes: 0 %
Lymphocytes Relative: 16 %
Lymphs Abs: 1.4 10*3/uL (ref 0.7–4.0)
MCH: 31.5 pg (ref 26.0–34.0)
MCHC: 33.7 g/dL (ref 30.0–36.0)
MCV: 93.6 fL (ref 80.0–100.0)
Monocytes Absolute: 0.8 10*3/uL (ref 0.1–1.0)
Monocytes Relative: 9 %
Neutro Abs: 6.6 10*3/uL (ref 1.7–7.7)
Neutrophils Relative %: 74 %
Platelets: 229 10*3/uL (ref 150–400)
RBC: 4.85 MIL/uL (ref 4.22–5.81)
RDW: 12.3 % (ref 11.5–15.5)
WBC: 8.9 10*3/uL (ref 4.0–10.5)
nRBC: 0 % (ref 0.0–0.2)

## 2022-04-22 LAB — COMPREHENSIVE METABOLIC PANEL
ALT: 12 U/L (ref 0–44)
AST: 22 U/L (ref 15–41)
Albumin: 4.6 g/dL (ref 3.5–5.0)
Alkaline Phosphatase: 79 U/L (ref 38–126)
Anion gap: 18 — ABNORMAL HIGH (ref 5–15)
BUN: 28 mg/dL — ABNORMAL HIGH (ref 8–23)
CO2: 23 mmol/L (ref 22–32)
Calcium: 10 mg/dL (ref 8.9–10.3)
Chloride: 100 mmol/L (ref 98–111)
Creatinine, Ser: 2.73 mg/dL — ABNORMAL HIGH (ref 0.61–1.24)
GFR, Estimated: 24 mL/min — ABNORMAL LOW (ref >60)
Glucose, Bld: 93 mg/dL (ref 70–99)
Potassium: 4.1 mmol/L (ref 3.5–5.1)
Sodium: 141 mmol/L (ref 135–145)
Total Bilirubin: 0.8 mg/dL (ref 0.3–1.2)
Total Protein: 7.5 g/dL (ref 6.5–8.1)

## 2022-04-22 LAB — RESP PANEL BY RT-PCR (FLU A&B, COVID) ARPGX2
Influenza A by PCR: NEGATIVE
Influenza B by PCR: NEGATIVE
SARS Coronavirus 2 by RT PCR: NEGATIVE

## 2022-04-22 LAB — TROPONIN I (HIGH SENSITIVITY): Troponin I (High Sensitivity): 18 ng/L — ABNORMAL HIGH (ref <18)

## 2022-04-22 MED ORDER — ONDANSETRON 4 MG PO TBDP
4.0000 mg | ORAL_TABLET | Freq: Once | ORAL | Status: AC
  Administered 2022-04-22: 4 mg via ORAL

## 2022-04-22 NOTE — ED Provider Notes (Signed)
  11:51 PM Brought back to triage for 2nd troponin and reports feeling worse.  Admitted in Feb 2023 with septic shock.  Feels like he has pneumonia again.  Does have findings of AKI on labs today, also has borderline O2 sats of 93%, low grade temp of 58F.  Will add on lactic and blood cultures.  Covid/flu negative.    Charge RN aware, will expedite room assignment.   Larene Pickett, PA-C 04/22/22 2354    Quintella Reichert, MD 04/23/22 [phone]

## 2022-04-22 NOTE — ED Triage Notes (Signed)
Pt reported to ED for evaluation of shortness of breath, expressing concerns for possible pneumonia. Pt also endorses central chest pain and nausea.

## 2022-04-22 NOTE — ED Provider Triage Note (Signed)
Emergency Medicine Provider Triage Evaluation Note  NORVEL WENKER , a 71 y.o. male  was evaluated in triage.  Pt complains cough, nasal congestion, shortness of breath, chest pain.  Patient states that symptoms been present for the past 2 days.  Reports history of pneumonia and states this feels similarly.  Reports shortness of breath as well as pain in the chest with minimal physical exertion.  Reports some subjective fever at home.  Has tried no medicines for this.  Review of Systems  Positive: See above Negative:   Physical Exam  BP 111/76 (BP Location: Left Arm)   Pulse 93   Temp 99.3 F (37.4 C) (Oral)   Resp 18   SpO2 97%  Gen:   Awake, no distress   Resp:  Normal effort  MSK:   Moves extremities without difficulty  Other:  No obvious murmurs gallops or rubs.  Diffuse wheeze/rhonchi auscultated in bilateral lung fields.  Medical Decision Making  Medically screening exam initiated at 8:45 PM.  Appropriate orders placed.  [name] was informed that the remainder of the evaluation will be completed by another provider, this initial triage assessment does not replace that evaluation, and the importance of remaining in the ED until their evaluation is complete.     Wilnette Kales, [place] 04/22/22 2054

## 2022-04-23 ENCOUNTER — Emergency Department (HOSPITAL_COMMUNITY): Payer: Medicare Other

## 2022-04-23 ENCOUNTER — Observation Stay (HOSPITAL_COMMUNITY): Payer: Medicare Other

## 2022-04-23 DIAGNOSIS — I10 Essential (primary) hypertension: Secondary | ICD-10-CM

## 2022-04-23 DIAGNOSIS — R079 Chest pain, unspecified: Secondary | ICD-10-CM

## 2022-04-23 DIAGNOSIS — R110 Nausea: Secondary | ICD-10-CM

## 2022-04-23 DIAGNOSIS — I5030 Unspecified diastolic (congestive) heart failure: Secondary | ICD-10-CM | POA: Diagnosis not present

## 2022-04-23 DIAGNOSIS — E8729 Other acidosis: Secondary | ICD-10-CM

## 2022-04-23 DIAGNOSIS — F1120 Opioid dependence, uncomplicated: Secondary | ICD-10-CM | POA: Diagnosis not present

## 2022-04-23 DIAGNOSIS — R519 Headache, unspecified: Secondary | ICD-10-CM

## 2022-04-23 DIAGNOSIS — B182 Chronic viral hepatitis C: Secondary | ICD-10-CM

## 2022-04-23 DIAGNOSIS — N179 Acute kidney failure, unspecified: Secondary | ICD-10-CM

## 2022-04-23 DIAGNOSIS — N1832 Chronic kidney disease, stage 3b: Secondary | ICD-10-CM | POA: Diagnosis present

## 2022-04-23 LAB — URINALYSIS, ROUTINE W REFLEX MICROSCOPIC
Bilirubin Urine: NEGATIVE
Glucose, UA: NEGATIVE mg/dL
Hgb urine dipstick: NEGATIVE
Ketones, ur: NEGATIVE mg/dL
Leukocytes,Ua: NEGATIVE
Nitrite: NEGATIVE
Protein, ur: NEGATIVE mg/dL
Specific Gravity, Urine: 1.018 (ref 1.005–1.030)
pH: 5 (ref 5.0–8.0)

## 2022-04-23 LAB — CBC
HCT: 45 % (ref 39.0–52.0)
Hemoglobin: 14.9 g/dL (ref 13.0–17.0)
MCH: 31.7 pg (ref 26.0–34.0)
MCHC: 33.1 g/dL (ref 30.0–36.0)
MCV: 95.7 fL (ref 80.0–100.0)
Platelets: 251 10*3/uL (ref 150–400)
RBC: 4.7 MIL/uL (ref 4.22–5.81)
RDW: 12.4 % (ref 11.5–15.5)
WBC: 8.2 10*3/uL (ref 4.0–10.5)
nRBC: 0 % (ref 0.0–0.2)

## 2022-04-23 LAB — TROPONIN I (HIGH SENSITIVITY): Troponin I (High Sensitivity): 19 ng/L — ABNORMAL HIGH (ref <18)

## 2022-04-23 LAB — ECHOCARDIOGRAM COMPLETE
Area-P 1/2: 3.27 cm2
Calc EF: 55.2 %
S' Lateral: 3.2 cm
Single Plane A2C EF: 53.5 %
Single Plane A4C EF: 54.8 %

## 2022-04-23 LAB — BASIC METABOLIC PANEL
Anion gap: 17 — ABNORMAL HIGH (ref 5–15)
BUN: 32 mg/dL — ABNORMAL HIGH (ref 8–23)
CO2: 28 mmol/L (ref 22–32)
Calcium: 10.3 mg/dL (ref 8.9–10.3)
Chloride: 98 mmol/L (ref 98–111)
Creatinine, Ser: 2.9 mg/dL — ABNORMAL HIGH (ref 0.61–1.24)
GFR, Estimated: 23 mL/min — ABNORMAL LOW (ref >60)
Glucose, Bld: 114 mg/dL — ABNORMAL HIGH (ref 70–99)
Potassium: 4.6 mmol/L (ref 3.5–5.1)
Sodium: 143 mmol/L (ref 135–145)

## 2022-04-23 LAB — LACTIC ACID, PLASMA
Lactic Acid, Venous: 1.3 mmol/L (ref 0.5–1.9)
Lactic Acid, Venous: 0.9 mmol/L (ref 0.5–1.9)

## 2022-04-23 LAB — PROTIME-INR
INR: 1.1 (ref 0.8–1.2)
Prothrombin Time: 13.8 seconds (ref 11.4–15.2)

## 2022-04-23 LAB — APTT: aPTT: 34 seconds (ref 24–36)

## 2022-04-23 LAB — BRAIN NATRIURETIC PEPTIDE: B Natriuretic Peptide: 36.2 pg/mL (ref 0.0–100.0)

## 2022-04-23 LAB — BETA-HYDROXYBUTYRIC ACID: Beta-Hydroxybutyric Acid: 0.22 mmol/L (ref 0.05–0.27)

## 2022-04-23 MED ORDER — ONDANSETRON HCL 4 MG/2ML IJ SOLN
4.0000 mg | Freq: Once | INTRAMUSCULAR | Status: AC
  Administered 2022-04-23: 4 mg via INTRAVENOUS
  Filled 2022-04-23: qty 2

## 2022-04-23 MED ORDER — METHADONE HCL 10 MG PO TABS
90.0000 mg | ORAL_TABLET | Freq: Every day | ORAL | Status: DC
  Administered 2022-04-23 – 2022-04-26 (×4): 90 mg via ORAL
  Filled 2022-04-23 (×4): qty 9

## 2022-04-23 MED ORDER — HEPARIN SODIUM (PORCINE) 5000 UNIT/ML IJ SOLN
5000.0000 [IU] | Freq: Three times a day (TID) | INTRAMUSCULAR | Status: DC
  Administered 2022-04-23 – 2022-04-26 (×8): 5000 [IU] via SUBCUTANEOUS
  Filled 2022-04-23 (×8): qty 1

## 2022-04-23 MED ORDER — SODIUM CHLORIDE 0.9 % IV BOLUS
1000.0000 mL | Freq: Once | INTRAVENOUS | Status: AC
  Administered 2022-04-23: 1000 mL via INTRAVENOUS

## 2022-04-23 MED ORDER — DEXAMETHASONE SODIUM PHOSPHATE 10 MG/ML IJ SOLN
10.0000 mg | Freq: Once | INTRAMUSCULAR | Status: AC
  Administered 2022-04-23: 10 mg via INTRAVENOUS
  Filled 2022-04-23: qty 1

## 2022-04-23 MED ORDER — METOCLOPRAMIDE HCL 5 MG/ML IJ SOLN
10.0000 mg | Freq: Once | INTRAMUSCULAR | Status: AC
  Administered 2022-04-23: 10 mg via INTRAVENOUS
  Filled 2022-04-23: qty 2

## 2022-04-23 MED ORDER — DIPHENHYDRAMINE HCL 50 MG/ML IJ SOLN
12.5000 mg | Freq: Once | INTRAMUSCULAR | Status: AC
  Administered 2022-04-23: 12.5 mg via INTRAVENOUS
  Filled 2022-04-23: qty 1

## 2022-04-23 MED ORDER — ACETAMINOPHEN 325 MG PO TABS
650.0000 mg | ORAL_TABLET | Freq: Four times a day (QID) | ORAL | Status: DC | PRN
  Administered 2022-04-23 – 2022-04-24 (×3): 650 mg via ORAL
  Filled 2022-04-23 (×3): qty 2

## 2022-04-23 MED ORDER — ACETAMINOPHEN 650 MG RE SUPP: 650.0000 mg | Freq: Four times a day (QID) | RECTAL | Status: DC | PRN

## 2022-04-23 MED ORDER — ONDANSETRON HCL 4 MG/2ML IJ SOLN: 4.0000 mg | Freq: Four times a day (QID) | INTRAMUSCULAR | Status: DC | PRN

## 2022-04-23 MED ORDER — DEXTROSE-NACL 5-0.45 % IV SOLN: INTRAVENOUS | Status: DC

## 2022-04-23 MED ORDER — MORPHINE SULFATE (PF) 4 MG/ML IV SOLN
4.0000 mg | Freq: Once | INTRAVENOUS | Status: AC
  Administered 2022-04-23: 4 mg via INTRAVENOUS
  Filled 2022-04-23: qty 1

## 2022-04-23 MED ORDER — PROCHLORPERAZINE EDISYLATE 10 MG/2ML IJ SOLN
10.0000 mg | Freq: Once | INTRAMUSCULAR | Status: AC
  Administered 2022-04-23: 10 mg via INTRAVENOUS
  Filled 2022-04-23: qty 2

## 2022-04-23 MED ORDER — ONDANSETRON HCL 4 MG PO TABS
4.0000 mg | ORAL_TABLET | Freq: Four times a day (QID) | ORAL | Status: DC | PRN
  Administered 2022-04-23: 4 mg via ORAL
  Filled 2022-04-23: qty 1

## 2022-04-23 MED ORDER — IPRATROPIUM-ALBUTEROL 0.5-2.5 (3) MG/3ML IN SOLN: 3.0000 mL | RESPIRATORY_TRACT | Status: DC | PRN

## 2022-04-23 MED ORDER — LACTATED RINGERS IV SOLN: INTRAVENOUS | Status: DC

## 2022-04-23 MED ORDER — DIPHENHYDRAMINE HCL 50 MG/ML IJ SOLN
25.0000 mg | Freq: Once | INTRAMUSCULAR | Status: AC
  Administered 2022-04-23: 25 mg via INTRAVENOUS
  Filled 2022-04-23: qty 1

## 2022-04-23 NOTE — Assessment & Plan Note (Signed)
With cirrhosis on [place] (though not on CT), s/p treatment / cure with antivirals.  Follows with GI but cirrhosis very compensated.  No h/o ascites, hepatic encephalopathy, LFT elevations, portal HTN or varices, etc.

## 2022-04-23 NOTE — Assessment & Plan Note (Addendum)
Etiology unclear: Lactate nl Starvation ketosis from poor PO intake?  No h/o DM, normal BGL, so DKA very unlikely. Check BHB Will put on D5 half NS as IVF for tonight. Tele monitor.

## 2022-04-23 NOTE — ED Notes (Signed)
Patient ambulated to the bathroom with a steady gait

## 2022-04-23 NOTE — H&P (Signed)
History and Physical    Patient: Jonathan Wilson NWG:[id number] DOB: [birth year] DOA: 04/22/2022 DOS: the patient was seen and examined on 04/23/2022 PCP: Vicenta Aly, [place]  Patient coming from: Home  Chief Complaint:  Chief Complaint  Patient presents with   Shortness of Breath   HPI: Jonathan Wilson is a 71 y.o. male with medical history significant of RCC, HCV s/p treatment, HTN, HLD, opiate dependence on chronic methadone.  Pt presents to ED with c/o headache, nausea, poor PO intake and dehydration.  Also has SOB, CP, nausea.  H/o same in Feb this year due to poor PO intake with associated AKI, N/V/D at that time felt to be due to diarrheal illness / enteritis.  Also had COPD exacerbation and hypotension at time of admission that admit.   Review of Systems: As mentioned in the history of present illness. All other systems reviewed and are negative. Past Medical History:  Diagnosis Date   Anxiety    Anxiety and depression 08-27-98   worse after death of wife Mar 28, 2013   Depression    Diverticulosis of colon 08-26-2009   seen on CT scan in 08-26-09.    Gout    Hepatitis C before 1998/08/27   Hyperlipidemia    Hypertension    Influenza A 06/2013   acute resp failure, did not require intubation.   Obesity    300 # in 08/26/08, BMI 34 in 04/2013.    Pneumonia    recurrent episodes    Psychosis ([place]) 08/27/98   s/e of treatment for Hepatitis C.     Renal cancer ([place])    Right lower lobe lung mass 05/2008   resolved on follow up CT 2010/08/27.    Vitamin D deficiency    Past Surgical History:  Procedure Laterality Date   CHOLECYSTECTOMY N/A 01/14/2014   Procedure: LAPAROSCOPIC CHOLECYSTECTOMY WITH INTRAOPERATIVE CHOLANGIOGRAM;  Surgeon: Shann Medal, MD;  Location: [facility];  Service: General;  Laterality: N/A;   MANDIBLE FRACTURE SURGERY     ROBOTIC ASSITED PARTIAL NEPHRECTOMY Right 08/07/2015   Procedure: XI ROBOTIC ASSISTED RIGHT PARTIAL NEPHRECTOMY;  Surgeon: Cleon Gustin, MD;  Location: [facility];   Service: Urology;  Laterality: Right;   Social History:  reports that he has quit smoking. His smoking use included cigarettes. He has a 12.50 pack-year smoking history. He has never used smokeless tobacco. He reports that he does not drink alcohol and does not use drugs.  No Known Allergies  Family History  Problem Relation Age of Onset   Diabetes Mother    Arthritis Mother    Cancer Father        pancreatic cancer    Prior to Admission medications   Medication Sig Start Date End Date Taking? Authorizing Provider  albuterol (VENTOLIN HFA) 108 (90 Base) MCG/ACT inhaler Inhale 2 puffs into the lungs every 6 (six) hours as needed for shortness of breath. 07/26/21   [provider]  amLODipine (NORVASC) 5 MG tablet Take 5 mg by mouth daily.    [provider]  aspirin EC 81 MG tablet Take 81 mg by mouth every morning. Swallow whole.    [provider]  atorvastatin (LIPITOR) 40 MG tablet Take 40 mg by mouth daily.    [provider]  lisinopril-hydrochlorothiazide (ZESTORETIC) 20-12.5 MG tablet Take 1 tablet by mouth daily.    [provider]  METHADONE HCL PO Take 120 mg by mouth daily.    [provider]  Multiple Vitamin (MULTIVITAMIN  WITH MINERALS) TABS tablet Take 1 tablet by mouth every morning. Centrum    [provider]  PARoxetine (PAXIL) 40 MG tablet Take 40 mg by mouth daily.    [provider]  Tiotropium Bromide-Olodaterol (STIOLTO RESPIMAT) 2.5-2.5 MCG/ACT AERS Inhale 2 puffs into the lungs daily. 03/25/22   Martyn Ehrich, NP    Physical Exam: Vitals:   04/23/22 0215 04/23/22 0230 04/23/22 0315 04/23/22 0332  BP: (!) 156/86 132/76 111/77   Pulse: 62 (!) 59 (!) 57   Resp: '18 15 20   '$ Temp:    98.3 F (36.8 C)  TempSrc:    Oral  SpO2: 95% 93% 100%    Constitutional: NAD, calm, comfortable Eyes: PERRL, lids and conjunctivae normal ENMT: Mucous membranes are moist. Posterior pharynx clear of any  exudate or lesions.Normal dentition.  Neck: normal, supple, no masses, no thyromegaly Respiratory: clear to auscultation bilaterally, no wheezing, no crackles. Normal respiratory effort. No accessory muscle use.  Cardiovascular: Regular rate and rhythm, no murmurs / rubs / gallops. No extremity edema. 2+ pedal pulses. No carotid bruits.  Abdomen: no tenderness, no masses palpated. No hepatosplenomegaly. Bowel sounds positive.  Musculoskeletal: no clubbing / cyanosis. No joint deformity upper and lower extremities. Good ROM, no contractures. Normal muscle tone.  Skin: no rashes, lesions, ulcers. No induration Neurologic: CN 2-12 grossly intact. Sensation intact, DTR normal. Strength 5/5 in all 4.  Psychiatric: Normal judgment and insight. Alert and oriented x 3. Normal mood.   Data Reviewed:       Latest Ref Rng & Units 04/22/2022    8:47 PM 08/01/2021    2:59 AM 07/31/2021    3:02 AM  CBC  WBC 4.0 - 10.5 K/uL 8.9  7.8  10.3   Hemoglobin 13.0 - 17.0 g/dL 15.3  11.4  10.6   Hematocrit 39.0 - 52.0 % 45.4  36.8  33.6   Platelets 150 - 400 K/uL 229  184  161       Latest Ref Rng & Units 04/22/2022    8:47 PM 08/01/2021   10:36 AM 08/01/2021    2:59 AM  CMP  Glucose 70 - 99 mg/dL 93   97   BUN 8 - 23 mg/dL 28   28   Creatinine 0.61 - 1.24 mg/dL 2.73   1.44   Sodium 135 - 145 mmol/L 141   140   Potassium 3.5 - 5.1 mmol/L 4.1  3.9  5.2   Chloride 98 - 111 mmol/L 100   105   CO2 22 - 32 mmol/L 23   25   Calcium 8.9 - 10.3 mg/dL 10.0   9.0   Total Protein 6.5 - 8.1 g/dL 7.5   6.9   Total Bilirubin 0.3 - 1.2 mg/dL 0.8   0.3   Alkaline Phos 38 - 126 U/L 79   62   AST 15 - 41 U/L 22   25   ALT 0 - 44 U/L 12   16    Trops 18 and 19  Lactate neg x2  COVID and flu neg  UA pending  CT C/A/P no acute findings  CT head no acute findings  CXR neg  Assessment and Plan: * AKI (acute kidney injury) ([place]) Pt with nausea, poor PO intake.  Lab workup shows AKI and mild anion gap acidosis  of unclear etiology. Pre-renal / ATN due to poor PO intake? IVF No obstruction on CT today Strict intake and output Repeat BMP in AM Hold  nephrotoxic meds. UA pending  High anion gap metabolic acidosis Etiology unclear: Lactate nl Starvation ketosis from poor PO intake?  No h/o DM, normal BGL, so DKA very unlikely. Check BHB Will put on D5 half NS as IVF for tonight.  Methadone maintenance therapy patient [facility]) Continue home methadone therapy.  Confirmed '90mg'$  daily with patient.  Essential hypertension Home med rec pending Hold nephrotoxic BP meds if taking any.  Hepatitis C With cirrhosis on [place] (though not on CT), s/p treatment / cure with antivirals.  Follows with GI but cirrhosis very compensated.  No h/o ascites, hepatic encephalopathy, LFT elevations, portal HTN or varices, etc.      Advance Care Planning:   Code Status: Full Code  Consults: None  Family Communication: Family at bedside  Severity of Illness: The appropriate patient status for this patient is OBSERVATION. Observation status is judged to be reasonable and necessary in order to provide the required intensity of service to ensure the patient's safety. The patient's presenting symptoms, physical exam findings, and initial radiographic and laboratory data in the context of their medical condition is felt to place them at decreased risk for further clinical deterioration. Furthermore, it is anticipated that the patient will be medically stable for discharge from the hospital within 2 midnights of admission.   Author: Etta Quill., DO 04/23/2022 3:37 AM  For on call review [email or web address].

## 2022-04-23 NOTE — ED Notes (Signed)
ED TO INPATIENT HANDOFF REPORT  ED Nurse Name and Phone #: Brittania Sudbeck RN  S Name/Age/Gender Jonathan Wilson 71 y.o. male Room/Bed: 046C/046C  Code Status   Code Status: Full Code  Home/SNF/Other Home Patient oriented to: self, place, time, and situation Is this baseline? Yes   Triage Complete: Triage complete  Chief Complaint AKI (acute kidney injury) ([place]) [N17.9]  Triage Note Pt reported to ED for evaluation of shortness of breath, expressing concerns for possible pneumonia. Pt also endorses central chest pain and nausea.    Allergies No Known Allergies  Level of Care/Admitting Diagnosis ED Disposition     ED Disposition  Admit   Condition  --   Comment  Hospital Area: [facility] [[id number]]  Level of Care: Telemetry Medical [[id number]]  May place patient in observation at [facility] or [facility] if equivalent level of care is available:: No  Covid Evaluation: Asymptomatic - no recent exposure (last 10 days) testing not required  Diagnosis: AKI (acute kidney injury) [facility]) [[id number]]  Admitting Physician: Etta Quill [[id number]]  Attending Physician: Etta Quill [phone]          B Medical/Surgery History Past Medical History:  Diagnosis Date   Anxiety    Anxiety and depression 1998/09/05   worse after death of wife 04-06-13   Depression    Diverticulosis of colon Sep 04, 2009   seen on CT scan in September 04, 2009.    Gout    Hepatitis C before 09/05/1998   Hyperlipidemia    Hypertension    Influenza A 06/2013   acute resp failure, did not require intubation.   Obesity    300 # in 2008-09-04, BMI 34 in 04/2013.    Pneumonia    recurrent episodes    Psychosis ([place]) 09-05-98   s/e of treatment for Hepatitis C.     Renal cancer ([place])    Right lower lobe lung mass 05/2008   resolved on follow up CT 2010-09-05.    Vitamin D deficiency    Past Surgical History:  Procedure Laterality Date   CHOLECYSTECTOMY N/A 01/14/2014   Procedure: LAPAROSCOPIC CHOLECYSTECTOMY WITH INTRAOPERATIVE  CHOLANGIOGRAM;  Surgeon: Shann Medal, MD;  Location: [facility];  Service: General;  Laterality: N/A;   MANDIBLE FRACTURE SURGERY     ROBOTIC ASSITED PARTIAL NEPHRECTOMY Right 08/07/2015   Procedure: XI ROBOTIC ASSISTED RIGHT PARTIAL NEPHRECTOMY;  Surgeon: Cleon Gustin, MD;  Location: [facility];  Service: Urology;  Laterality: Right;     A IV Location/Drains/Wounds Patient Lines/Drains/Airways Status     Active Line/Drains/Airways     Name Placement date Placement time Site Days   Peripheral IV 04/23/22 20 G Anterior;Distal;Right Forearm 04/23/22  0042  Forearm  less than 1   Peripheral IV 04/23/22 20 G Anterior;Distal;Left Forearm 04/23/22  0042  Forearm  less than 1   Incision (Closed) 08/07/15 Abdomen Other (Comment) 08/07/15  1639  -- 2451   Incision - 5 Ports Abdomen 1: Right;Upper 2: Superior;Medial 3: Right;Medial 4: Right;Lower 5: Medial;Umbilicus 17/79/39  0300  -- 2451            Intake/Output Last 24 hours  Intake/Output Summary (Last 24 hours) at 04/23/2022 2314-09-05 Last data filed at 04/23/2022 09/04/2029 Gross per 24 hour  Intake --  Output 425 ml  Net -425 ml    Labs/Imaging Results for orders placed or performed during the hospital encounter of 04/22/22 (from the past 48 hour(s))  Comprehensive metabolic panel     Status:  Abnormal   Collection Time: 04/22/22  8:47 PM  Result Value Ref Range   Sodium 141 135 - 145 mmol/L   Potassium 4.1 3.5 - 5.1 mmol/L   Chloride 100 98 - 111 mmol/L   CO2 23 22 - 32 mmol/L   Glucose, Bld 93 70 - 99 mg/dL    Comment: Glucose reference range applies only to samples taken after fasting for at least 8 hours.   BUN 28 (H) 8 - 23 mg/dL   Creatinine, Ser 2.73 (H) 0.61 - 1.24 mg/dL   Calcium 10.0 8.9 - 10.3 mg/dL   Total Protein 7.5 6.5 - 8.1 g/dL   Albumin 4.6 3.5 - 5.0 g/dL   AST 22 15 - 41 U/L   ALT 12 0 - 44 U/L   Alkaline Phosphatase 79 38 - 126 U/L   Total Bilirubin 0.8 0.3 - 1.2 mg/dL   GFR, Estimated 24 (L) >60 mL/min     Comment: (NOTE) Calculated using the CKD-EPI Creatinine Equation (2021)    Anion gap 18 (H) 5 - 15    Comment: Performed at [facility] [address] [place], [place] [address]  CBC with Differential     Status: None   Collection Time: 04/22/22  8:47 PM  Result Value Ref Range   WBC 8.9 4.0 - 10.5 K/uL   RBC 4.85 4.22 - 5.81 MIL/uL   Hemoglobin 15.3 13.0 - 17.0 g/dL   HCT 45.4 39.0 - 52.0 %   MCV 93.6 80.0 - 100.0 fL   MCH 31.5 26.0 - 34.0 pg   MCHC 33.7 30.0 - 36.0 g/dL   RDW 12.3 11.5 - 15.5 %   Platelets 229 150 - 400 K/uL   nRBC 0.0 0.0 - 0.2 %   Neutrophils Relative % 74 %   Neutro Abs 6.6 1.7 - 7.7 K/uL   Lymphocytes Relative 16 %   Lymphs Abs 1.4 0.7 - 4.0 K/uL   Monocytes Relative 9 %   Monocytes Absolute 0.8 0.1 - 1.0 K/uL   Eosinophils Relative 1 %   Eosinophils Absolute 0.1 0.0 - 0.5 K/uL   Basophils Relative 0 %   Basophils Absolute 0.0 0.0 - 0.1 K/uL   Immature Granulocytes 0 %   Abs Immature Granulocytes 0.02 0.00 - 0.07 K/uL    Comment: Performed at [facility], [address], [place], [place] [address]  Troponin I (High Sensitivity)     Status: Abnormal   Collection Time: 04/22/22  8:47 PM  Result Value Ref Range   Troponin I (High Sensitivity) 18 (H) <18 ng/L    Comment: (NOTE) Elevated high sensitivity troponin I (hsTnI) values and significant  changes across serial measurements may suggest ACS but many other  chronic and acute conditions are known to elevate hsTnI results.  Refer to the "Links" section for chest pain algorithms and additional  guidance. Performed at [facility], [place] [address] [place], [place] [address]   Resp Panel by RT-PCR (Flu A&B, Covid) Anterior Nasal Swab     Status: None   Collection Time: 04/22/22  8:47 PM   Specimen: Anterior Nasal Swab  Result Value Ref Range   SARS Coronavirus 2 by RT PCR NEGATIVE NEGATIVE    Comment: (NOTE) SARS-CoV-2 target nucleic acids are NOT DETECTED.  The SARS-CoV-2  RNA is generally detectable in upper respiratory specimens during the acute phase of infection. The lowest concentration of SARS-CoV-2 viral copies this assay can detect is 138 copies/mL.  A negative result does not preclude SARS-Cov-2 infection and should not be used as the sole basis for treatment or other patient management decisions. A negative result may occur with  improper specimen collection/handling, submission of specimen other than nasopharyngeal swab, presence of viral mutation(s) within the areas targeted by this assay, and inadequate number of viral copies(<138 copies/mL). A negative result must be combined with clinical observations, patient history, and epidemiological information. The expected result is Negative.  Fact Sheet for Patients:  EntrepreneurPulse.com.au  Fact Sheet for Healthcare Providers:  IncredibleEmployment.be  This test is no t yet approved or cleared by the [place] FDA and  has been authorized for detection and/or diagnosis of SARS-CoV-2 by FDA under an Emergency Use Authorization (EUA). This EUA will remain  in effect (meaning this test can be used) for the duration of the COVID-19 declaration under Section 564(b)(1) of the Act, 21 U.S.C.section 360bbb-3(b)(1), unless the authorization is terminated  or revoked sooner.       Influenza A by PCR NEGATIVE NEGATIVE   Influenza B by PCR NEGATIVE NEGATIVE    Comment: (NOTE) The Xpert Xpress SARS-CoV-2/FLU/RSV plus assay is intended as an aid in the diagnosis of influenza from Nasopharyngeal swab specimens and should not be used as a sole basis for treatment. Nasal washings and aspirates are unacceptable for Xpert Xpress SARS-CoV-2/FLU/RSV testing.  Fact Sheet for Patients: EntrepreneurPulse.com.au  Fact Sheet for Healthcare Providers: IncredibleEmployment.be  This test is not yet approved or cleared by the [place]  FDA and has been authorized for detection and/or diagnosis of SARS-CoV-2 by FDA under an Emergency Use Authorization (EUA). This EUA will remain in effect (meaning this test can be used) for the duration of the COVID-19 declaration under Section 564(b)(1) of the Act, 21 U.S.C. section 360bbb-3(b)(1), unless the authorization is terminated or revoked.  Performed at [facility], [place] [address], [place], [place] [address]   Troponin I (High Sensitivity)     Status: Abnormal   Collection Time: 04/22/22 11:31 PM  Result Value Ref Range   Troponin I (High Sensitivity) 19 (H) <18 ng/L    Comment: (NOTE) Elevated high sensitivity troponin I (hsTnI) values and significant  changes across serial measurements may suggest ACS but many other  chronic and acute conditions are known to elevate hsTnI results.  Refer to the "Links" section for chest pain algorithms and additional  guidance. Performed at [facility], [place] [address] [place], [place] [address]   Blood culture (routine x 2)     Status: None (Preliminary result)   Collection Time: 04/23/22 12:35 AM   Specimen: BLOOD RIGHT FOREARM  Result Value Ref Range   Specimen Description BLOOD RIGHT FOREARM    Special Requests      BOTTLES DRAWN AEROBIC AND ANAEROBIC Blood Culture results may not be optimal due to an excessive volume of blood received in culture bottles   Culture      NO GROWTH < 12 HOURS Performed at [facility] [address] [place], [place] [address]    Report Status PENDING   Lactic acid, plasma     Status: None   Collection Time: 04/23/22 12:45 AM  Result Value Ref Range   Lactic Acid, Venous 1.3 0.5 - 1.9 mmol/L    Comment: Performed at [facility] [address], [place], [place] [address]  Blood culture (routine x 2)     Status: None (Preliminary result)   Collection Time: 04/23/22 12:45  AM   Specimen: BLOOD LEFT FOREARM  Result Value Ref Range   Specimen Description BLOOD LEFT  FOREARM    Special Requests      BOTTLES DRAWN AEROBIC AND ANAEROBIC Blood Culture results may not be optimal due to an excessive volume of blood received in culture bottles   Culture      NO GROWTH < 12 HOURS Performed at [facility] [address] [place], [place] [address]    Report Status PENDING   Protime-INR     Status: None   Collection Time: 04/23/22 12:45 AM  Result Value Ref Range   Prothrombin Time 13.8 11.4 - 15.2 seconds   INR 1.1 0.8 - 1.2    Comment: (NOTE) INR goal varies based on device and disease states. Performed at [facility], [place] [address] [place], [place] [address]   APTT     Status: None   Collection Time: 04/23/22 12:45 AM  Result Value Ref Range   aPTT 34 24 - 36 seconds    Comment: Performed at [facility] [address] [place] [address]  CBC     Status: None   Collection Time: 04/23/22 12:45 AM  Result Value Ref Range   WBC 8.2 4.0 - 10.5 K/uL   RBC 4.70 4.22 - 5.81 MIL/uL   Hemoglobin 14.9 13.0 - 17.0 g/dL   HCT 45.0 39.0 - 52.0 %   MCV 95.7 80.0 - 100.0 fL   MCH 31.7 26.0 - 34.0 pg   MCHC 33.1 30.0 - 36.0 g/dL   RDW 12.4 11.5 - 15.5 %   Platelets 251 150 - 400 K/uL   nRBC 0.0 0.0 - 0.2 %    Comment: Performed at [facility], [place] [address], [place], [place] [address]  Basic metabolic panel     Status: Abnormal   Collection Time: 04/23/22 12:45 AM  Result Value Ref Range   Sodium 143 135 - 145 mmol/L   Potassium 4.6 3.5 - 5.1 mmol/L   Chloride 98 98 - 111 mmol/L   CO2 28 22 - 32 mmol/L   Glucose, Bld 114 (H) 70 - 99 mg/dL    Comment: Glucose reference range applies only to samples taken after fasting for at least 8 hours.   BUN 32 (H) 8 - 23 mg/dL   Creatinine, Ser 2.90 (H) 0.61 - 1.24 mg/dL   Calcium 10.3 8.9 - 10.3 mg/dL   GFR, Estimated 23 (L) >60 mL/min    Comment: (NOTE) Calculated using the CKD-EPI Creatinine Equation (2021)    Anion gap 17 (H) 5 - 15    Comment: Performed at [facility] [address] [place], [place] [address]  Beta-hydroxybutyric acid     Status: None   Collection Time: 04/23/22 12:45 AM  Result Value Ref Range   Beta-Hydroxybutyric Acid 0.22 0.05 - 0.27 mmol/L    Comment: Performed at [facility] [address], [place], [place] [address]  Lactic acid, plasma     Status: None   Collection Time: 04/23/22  2:10 AM  Result Value Ref Range   Lactic Acid, Venous 0.9 0.5 - 1.9 mmol/L    Comment: Performed at [facility] [address], [place],  [address]  Urinalysis, Routine w reflex microscopic Urine, Unspecified Source     Status: Abnormal   Collection Time: 04/23/22  9:43 AM  Result Value Ref Range   Color, Urine YELLOW  YELLOW   APPearance HAZY (A) CLEAR   Specific Gravity, Urine 1.018 1.005 - 1.030   pH 5.0 5.0 - 8.0   Glucose, UA NEGATIVE NEGATIVE mg/dL   Hgb urine dipstick NEGATIVE NEGATIVE   Bilirubin Urine NEGATIVE NEGATIVE   Ketones, ur NEGATIVE NEGATIVE mg/dL   Protein, ur NEGATIVE NEGATIVE mg/dL   Nitrite NEGATIVE NEGATIVE   Leukocytes,Ua NEGATIVE NEGATIVE    Comment: Performed at [facility] [address] [place], [place] [address]  Brain natriuretic peptide     Status: None   Collection Time: 04/23/22 10:14 AM  Result Value Ref Range   B Natriuretic Peptide 36.2 0.0 - 100.0 pg/mL    Comment: Performed at [facility] [address], [place], [place] [address]   ECHOCARDIOGRAM COMPLETE  Result Date: 04/23/2022    ECHOCARDIOGRAM REPORT   Patient Name:   SIGMOND PATALANO [place] Date of Exam: 04/23/2022 Medical Rec #:  [id number]     Height:       74.0 in Accession #:    [id number]    Weight:       223.2 lb Date of Birth:  07/19/1950     BSA:          2.277 m Patient Age:    29 years      BP:           110/77 mmHg Patient Gender: M             HR:           52 bpm. Exam Location:  Inpatient Procedure: 2D Echo, Cardiac Doppler and Color Doppler Indications:    R07.9* Chest pain, unspecified   History:        Patient has prior history of Echocardiogram examinations, most                 recent 07/07/2020. Signs/Symptoms:Chest Pain; Risk                 Factors:Hypertension and Dyslipidemia.  Sonographer:    Bernadene Person RDCS Referring Phys: [id number] RIPUDEEP K RAI IMPRESSIONS  1. Left ventricular ejection fraction, by estimation, is 50 to 55%. The left ventricle has low normal function. The left ventricle has no regional wall motion abnormalities. Left ventricular diastolic parameters are consistent with Grade I diastolic dysfunction (impaired relaxation).  2. Right ventricular systolic function is normal. The right ventricular size is normal. There is normal pulmonary artery systolic pressure.  3. The mitral valve is normal in structure. No evidence of mitral valve regurgitation. No evidence of mitral stenosis.  4. The aortic valve was not well visualized. Aortic valve regurgitation is not visualized.  5. Aortic dilatation noted. There is mild dilatation of the aortic root, measuring 40 mm.  6. The inferior vena cava is dilated in size with >50% respiratory variability, suggesting right atrial pressure of 8 mmHg. FINDINGS  Left Ventricle: Left ventricular ejection fraction, by estimation, is 50 to 55%. The left ventricle has low normal function. The left ventricle has no regional wall motion abnormalities. The left ventricular internal cavity size was normal in size. There is no left ventricular hypertrophy. Left ventricular diastolic parameters are consistent with Grade I diastolic dysfunction (impaired relaxation). Right Ventricle: The right ventricular size is normal. No increase in right ventricular wall thickness. Right ventricular systolic function is normal. There is normal pulmonary artery systolic pressure. The tricuspid regurgitant velocity is 1.17 m/s, and  with an assumed right atrial pressure of 8  mmHg, the estimated right ventricular systolic pressure is 90.2 mmHg. Left Atrium: Left atrial size  was normal in size. Right Atrium: Right atrial size was normal in size. Pericardium: There is no evidence of pericardial effusion. Mitral Valve: The mitral valve is normal in structure. No evidence of mitral valve regurgitation. No evidence of mitral valve stenosis. Tricuspid Valve: The tricuspid valve is not well visualized. Tricuspid valve regurgitation is not demonstrated. No evidence of tricuspid stenosis. Aortic Valve: The aortic valve was not well visualized. Aortic valve regurgitation is not visualized. Pulmonic Valve: The pulmonic valve was not well visualized. Pulmonic valve regurgitation is not visualized. No evidence of pulmonic stenosis. Aorta: The aortic root is normal in size and structure and aortic dilatation noted. There is mild dilatation of the aortic root, measuring 40 mm. Venous: The inferior vena cava is dilated in size with greater than 50% respiratory variability, suggesting right atrial pressure of 8 mmHg. IAS/Shunts: No atrial level shunt detected by color flow Doppler.  LEFT VENTRICLE PLAX 2D LVIDd:         5.40 cm      Diastology LVIDs:         3.20 cm      LV e' medial:    7.40 cm/s LV PW:         0.80 cm      LV E/e' medial:  9.4 LV IVS:        0.80 cm      LV e' lateral:   9.68 cm/s LVOT diam:     2.30 cm      LV E/e' lateral: 7.2 LV SV:         104 LV SV Index:   46 LVOT Area:     4.15 cm  LV Volumes (MOD) LV vol d, MOD A2C: 99.7 ml LV vol d, MOD A4C: 116.0 ml LV vol s, MOD A2C: 46.4 ml LV vol s, MOD A4C: 52.4 ml LV SV MOD A2C:     53.3 ml LV SV MOD A4C:     116.0 ml LV SV MOD BP:      61.4 ml RIGHT VENTRICLE RV S prime:     10.80 cm/s TAPSE (M-mode): 1.8 cm LEFT ATRIUM             Index        RIGHT ATRIUM           Index LA diam:        3.30 cm 1.45 cm/m   RA Area:     20.10 cm LA Vol (A2C):   43.8 ml 19.23 ml/m  RA Volume:   66.80 ml  29.33 ml/m LA Vol (A4C):   44.1 ml 19.37 ml/m LA Biplane Vol: 43.6 ml 19.15 ml/m  AORTIC VALVE LVOT Vmax:   134.00 cm/s LVOT Vmean:  81.200  cm/s LVOT VTI:    0.250 m  AORTA Ao Root diam: 4.00 cm MITRAL VALVE               TRICUSPID VALVE MV Area (PHT): 3.27 cm    TR Peak grad:   5.5 mmHg MV Decel Time: 232 msec    TR Vmax:        117.00 cm/s MV E velocity: 69.40 cm/s MV A velocity: 69.00 cm/s  SHUNTS MV E/A ratio:  1.01        Systemic VTI:  0.25 m  Systemic Diam: 2.30 cm Rudean Haskell MD Electronically signed by Rudean Haskell MD Signature Date/Time: 04/23/2022/9:40:06 AM    Final    CT CHEST ABDOMEN PELVIS WO CONTRAST  Result Date: 04/23/2022 CLINICAL DATA:  Sepsis. EXAM: CT CHEST, ABDOMEN AND PELVIS WITHOUT CONTRAST TECHNIQUE: Multidetector CT imaging of the chest, abdomen and pelvis was performed following the standard protocol without IV contrast. RADIATION DOSE REDUCTION: This exam was performed according to the departmental dose-optimization program which includes automated exposure control, adjustment of the mA and/or kV according to patient size and/or use of iterative reconstruction technique. COMPARISON:  Chest CT dated 10/25/2021 and CT abdomen pelvis dated 07/30/2021. FINDINGS: Evaluation of this exam is limited in the absence of intravenous contrast. CT CHEST FINDINGS Cardiovascular: There is no cardiomegaly or pericardial effusion. Three-vessel coronary vascular calcification. Mild atherosclerotic calcification of the thoracic aorta. No aneurysmal dilatation. The central pulmonary arteries are grossly unremarkable. Mediastinum/Nodes: No hilar or mediastinal adenopathy. The esophagus and thyroid gland are grossly unremarkable. No mediastinal fluid collection. Lungs/Pleura: The lungs are clear. There is no pleural effusion or pneumothorax. The central airways are patent. Musculoskeletal: Degenerative changes of the spine. No acute osseous pathology. CT ABDOMEN PELVIS FINDINGS No intra-abdominal free air or free fluid. Hepatobiliary: The liver is unremarkable. No biliary dilatation.  Cholecystectomy. Pancreas: The pancreas is unremarkable as visualized. Spleen: Normal in size without focal abnormality. Adrenals/Urinary Tract: The adrenal glands are unremarkable. Postsurgical changes of the upper pole of the right kidney as seen on the prior CT. There is no hydronephrosis or nephrolithiasis on either side. The visualized ureters and urinary bladder appear unremarkable. Stomach/Bowel: There is a 2.5 cm duodenal diverticulum. There is no bowel obstruction or active inflammation. The appendix is normal. Vascular/Lymphatic: Advanced aortoiliac atherosclerotic disease. There is a 3.5 cm infrarenal abdominal aortic aneurysm which is poorly evaluated in the absence of contrast but similar to prior CT. The IVC is unremarkable. No portal venous gas. There is no adenopathy. Reproductive: The prostate and seminal vesicles are grossly unremarkable. No pelvic mass. Other: None Musculoskeletal: Osteopenia with degenerative changes of the spine. No acute osseous pathology. IMPRESSION: 1. No acute intrathoracic, abdominal, or pelvic pathology. 2. A 3.5 cm infrarenal abdominal aortic aneurysm similar to prior CT. Recommend follow-up ultrasound every 2 years. This recommendation follows ACR consensus guidelines: White Paper of the ACR Incidental Findings Committee II on Vascular Findings. J Am Coll Radiol 2013; 32:951-884. 3.  Aortic Atherosclerosis (ICD10-I70.0). Electronically Signed   By: Anner Crete M.D.   On: 04/23/2022 01:45   CT HEAD WO CONTRAST (5MM)  Result Date: 04/23/2022 CLINICAL DATA:  Headache, new onset (Age >= 81y) EXAM: CT HEAD WITHOUT CONTRAST TECHNIQUE: Contiguous axial images were obtained from the base of the skull through the vertex without intravenous contrast. RADIATION DOSE REDUCTION: This exam was performed according to the departmental dose-optimization program which includes automated exposure control, adjustment of the mA and/or kV according to patient size and/or use of  iterative reconstruction technique. COMPARISON:  09/18/2015 FINDINGS: Brain: There is atrophy and chronic small vessel disease changes. No acute intracranial abnormality. Specifically, no hemorrhage, hydrocephalus, mass lesion, acute infarction, or significant intracranial injury. Vascular: No hyperdense vessel or unexpected calcification. Skull: No acute calvarial abnormality. Sinuses/Orbits: No acute findings Other: None IMPRESSION: Atrophy, chronic microvascular disease. No acute intracranial abnormality. Electronically Signed   By: Rolm Baptise M.D.   On: 04/23/2022 01:38   DG Chest 2 View  Result Date: 04/22/2022 CLINICAL DATA:  Two day history of shortness of  breath and cough EXAM: CHEST - 2 VIEW COMPARISON:  Chest radiograph dated 07/30/2021, CT chest dated 10/25/2021 FINDINGS: Mildly hyperinflated lungs. No focal consolidations. No pleural effusion or pneumothorax. The heart size and mediastinal contours are within normal limits. The visualized skeletal structures are unremarkable. Cholecystectomy clips. IMPRESSION: No active cardiopulmonary disease. Electronically Signed   By: Darrin Nipper M.D.   On: 04/22/2022 21:24    Pending Labs Unresulted Labs (From admission, onward)     Start     Ordered   04/24/22 0500  CBC  Tomorrow morning,   R        04/23/22 0726   04/24/22 8850  Basic metabolic panel  Daily at 5am,   R      04/23/22 0726   04/23/22 0720  Urine Culture  (Urine Culture)  ONCE - URGENT,   URGENT       Question:  Indication  Answer:  Urgency/frequency   04/23/22 0719   04/23/22 0719  Urinalysis, Routine w reflex microscopic Urine, Unspecified Source  ONCE - URGENT,   URGENT        04/23/22 0719            Vitals/Pain Today's Vitals   04/23/22 1900 04/23/22 2000 04/23/22 2032 04/23/22 2115  BP: 138/89 132/83    Pulse: 66   69  Resp: (!) '21 19  16  '$ Temp:   97.9 F (36.6 C)   TempSrc:   Oral   SpO2: 98%   97%  Weight:      Height:      PainSc:        Isolation  Precautions No active isolations  Medications Medications  heparin injection 5,000 Units (5,000 Units Subcutaneous Not Given 04/23/22 2200)  acetaminophen (TYLENOL) tablet 650 mg (650 mg Oral Given 04/23/22 1727)    Or  acetaminophen (TYLENOL) suppository 650 mg ( Rectal See Alternative 04/23/22 1727)  ondansetron (ZOFRAN) tablet 4 mg (4 mg Oral Given 04/23/22 1728)    Or  ondansetron (ZOFRAN) injection 4 mg ( Intravenous See Alternative 04/23/22 1728)  methadone (DOLOPHINE) tablet 90 mg (90 mg Oral Given 04/23/22 0851)  ipratropium-albuterol (DUONEB) 0.5-2.5 (3) MG/3ML nebulizer solution 3 mL (has no administration in time range)  lactated ringers infusion ( Intravenous New Bag/Given 04/23/22 1729)  ondansetron (ZOFRAN-ODT) disintegrating tablet 4 mg (4 mg Oral Given 04/22/22 2159)  sodium chloride 0.9 % bolus 1,000 mL (0 mLs Intravenous Stopped 04/23/22 0307)  sodium chloride 0.9 % bolus 1,000 mL (0 mLs Intravenous Stopped 04/23/22 0307)  ondansetron (ZOFRAN) injection 4 mg (4 mg Intravenous Given 04/23/22 0050)  morphine (PF) 4 MG/ML injection 4 mg (4 mg Intravenous Given 04/23/22 0110)  diphenhydrAMINE (BENADRYL) injection 25 mg (25 mg Intravenous Given 04/23/22 0208)  prochlorperazine (COMPAZINE) injection 10 mg (10 mg Intravenous Given 04/23/22 0208)  metoCLOPramide (REGLAN) injection 10 mg (10 mg Intravenous Given 04/23/22 0853)  dexamethasone (DECADRON) injection 10 mg (10 mg Intravenous Given 04/23/22 0853)  diphenhydrAMINE (BENADRYL) injection 12.5 mg (12.5 mg Intravenous Given 04/23/22 0853)    Mobility walks Low fall risk   Focused Assessments Cardiac Assessment Handoff:  Cardiac Rhythm: Sinus bradycardia Lab Results  Component Value Date   TROPONINI <0.03 11/20/2016   Lab Results  Component Value Date   DDIMER (H) 03/15/2010    0.77        AT THE INHOUSE ESTABLISHED CUTOFF VALUE OF 0.48 ug/mL FEU, THIS ASSAY HAS BEEN DOCUMENTED IN THE LITERATURE TO HAVE A  SENSITIVITY AND NEGATIVE  PREDICTIVE VALUE OF AT LEAST 98 TO 99%.  THE TEST RESULT SHOULD BE CORRELATED WITH AN ASSESSMENT OF THE CLINICAL PROBABILITY OF DVT / VTE.   Does the Patient currently have chest pain? No   , Pulmonary Assessment Handoff:  Lung sounds:   O2 Device: Nasal Cannula O2 Flow Rate (L/min): 2 L/min    R Recommendations: See Admitting Provider Note  Report given to:   Additional Notes:

## 2022-04-23 NOTE — Progress Notes (Signed)
[facility]                                                                              Essam Lowdermilk, is a 71 y.o. male, DOB - Apr 10, 1951, BPZ:[id number] Admit date - 04/22/2022    Outpatient Primary MD for the patient is Vicenta Aly, [place]  LOS - 0  days  Chief Complaint  Patient presents with   Shortness of Breath       Brief summary   Patient is a 71 year old male with history of renal cell CA, HCV status posttreatment, HTN, HLD, opiate dependence on chronic methadone, COPD, history of smoking for 50 years.  Patient presented with multiple complaints including chest pain, shortness of breath, nausea, poor p.o. intake dehydration, headache No fevers, no cough, focal neurological deficits or slurred speech. Patient was admitted for further work-up. In ED, temp 99.3 F, respiratory rate 18, pulse 93, BP 111/76, O2 sats 97% on 2 L.  BMET showed BUN 28, creatinine 2.7, was 1.4 on 08/01/2021 Troponin 19-18 Lactic acid 1.3-0.9  CT head showed atrophy with chronic microvascular ischemic disease, no acute intracranial abnormality.  Chest x-ray showed no active cardiopulmonary abnormality.  CT abdomen pelvis, chest showed no acute intrathoracic abdominal or pelvic pathology.  3.5 cm infrarenal abdominal aortic aneurysm similar to prior  Assessment & Plan    Principal Problem:   AKI (acute kidney injury) ([place]) superimposed on CKD stage IIIb -Likely worsened due to dehydration, poor p.o. intake, medications including lisinopril HCTZ, NSAIDs (ibuprofen) -CT abdomen pelvis did not show any hydronephrosis or obstruction -Creatinine 2.7 on admission, was 1.4 on 08/01/2021, worsened to 2.9 today -Continue IV fluid hydration, if no significant improvement in 24 hours, will consult nephrology -Hold lisinopril HCTZ -Follow UA, urine culture   Active Problems:   High anion gap metabolic acidosis -Anion gap 18 on admission, unclear etiology, no lactic acidosis. ?starvation  ketosis due to poor p.o. intake -BHB 0.2, tox screen positive for amphetamines -Improving, continue IV fluid hydration  Chest pain, shortness of breath with underlying history of COPD -CT chest done without contrast showed no acute intrathoracic pathology. -Currently improving, troponins flat, EKG showed no acute ST-T wave changes suggestive of ischemia -Follow 2D echo -Continue DuoNebs every 4 hours as needed, currently no wheezing    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[place]) -Continue methadone maintenance therapy    Hepatitis C -Status posttreatment, cirrhosis on [place], outpatient follow-up with GI.  No ascites, hepatic encephalopathy or transaminitis.    Essential hypertension -Continue to hold lisinopril, HCTZ, BP currently stable -Will place on IV hydralazine as needed with parameters     Headache -No focal neurological deficits, no fevers, possibly migraine headache.  -Will give 1 dose of IV Reglan, Benadryl, currently on methadone -If no significant improvement, will add IV Depacon  Infrarenal abdominal aortic aneurysm -CT abdomen and pelvis showed 3.5 cm infrarenal AAA similar to prior CT, recommended follow-up ultrasound every 2 years.  Code Status: Full CODE STATUS DVT Prophylaxis:  heparin injection 5,000 Units Start: 04/23/22 1400   Level of Care: Level of care: Telemetry Medical Family Communication: Updated patient's family member at bedside  Disposition Plan:      Remains inpatient appropriate: Possibly DC home tomorrow if improvement.  Procedures:  2D echo  Consultants:   None Antimicrobials: None    Medications  heparin  5,000 Units Subcutaneous Q8H   methadone  90 mg Oral Q breakfast      Subjective:   Contrell Ballentine was seen and examined today.  Complaining of headache, nausea, mild chest pain, mild shortness of breath.  No abdominal pain, vomiting or diarrhea.  No fevers.  Objective:   Vitals:   04/23/22 0700 04/23/22 0745 04/23/22 0810  04/23/22 0903  BP: 133/76 110/77  (!) 142/78  Pulse: (!) 49 (!) 50  (!) 56  Resp: '19 16  16  '$ Temp:    98.2 F (36.8 C)  TempSrc:    Oral  SpO2: 100% 99%  98%  Weight:   101 kg   Height:   '6\' 2"'$  (1.88 m)    No intake or output data in the 24 hours ending 04/23/22 1000   Wt Readings from Last 3 Encounters:  04/23/22 101 kg  03/25/22 101.2 kg  08/28/21 101.4 kg     Exam General: Alert and oriented x 3, NAD Cardiovascular: S1 S2 auscultated,  RRR Respiratory: Mild scattered wheezing Gastrointestinal: Soft, nontender, nondistended, + bowel sounds Ext: no pedal edema bilaterally Neuro: Strength 5/5 upper and lower extremities bilaterally Psych: Normal affect and demeanor, alert and oriented x3     Data Reviewed:  I have personally reviewed following labs    CBC Lab Results  Component Value Date   WBC 8.2 04/23/2022   RBC 4.70 04/23/2022   HGB 14.9 04/23/2022   HCT 45.0 04/23/2022   MCV 95.7 04/23/2022   MCH 31.7 04/23/2022   PLT 251 04/23/2022   MCHC 33.1 04/23/2022   RDW 12.4 04/23/2022   LYMPHSABS 1.4 04/22/2022   MONOABS 0.8 04/22/2022   EOSABS 0.1 04/22/2022   BASOSABS 0.0 42/35/3614     Last metabolic panel Lab Results  Component Value Date   NA 143 04/23/2022   K 4.6 04/23/2022   CL 98 04/23/2022   CO2 28 04/23/2022   BUN 32 (H) 04/23/2022   CREATININE 2.90 (H) 04/23/2022   GLUCOSE 114 (H) 04/23/2022   GFRNONAA 23 (L) 04/23/2022   GFRAA 52 (L) 01/22/2019   CALCIUM 10.3 04/23/2022   PHOS 4.4 04/30/2015   PROT 7.5 04/22/2022   ALBUMIN 4.6 04/22/2022   BILITOT 0.8 04/22/2022   ALKPHOS 79 04/22/2022   AST 22 04/22/2022   ALT 12 04/22/2022   ANIONGAP 17 (H) 04/23/2022    CBG (last 3)  No results for input(s): "GLUCAP" in the last 72 hours.    Coagulation Profile: Recent Labs  Lab 04/23/22 0045  INR 1.1     Radiology Studies: I have personally reviewed the imaging studies  ECHOCARDIOGRAM COMPLETE  Result Date: 04/23/2022     ECHOCARDIOGRAM REPORT   Patient Name:   MAXAMILIAN AMADON Guillet Date of Exam: 04/23/2022 Medical Rec #:  [id number]     Height:       74.0 in Accession #:    [id number]    Weight:       223.2 lb Date of Birth:  04-06-1951     BSA:          2.277 m Patient Age:    65 years      BP:           110/77 mmHg Patient Gender: M  HR:           52 bpm. Exam Location:  Inpatient Procedure: 2D Echo, Cardiac Doppler and Color Doppler Indications:    R07.9* Chest pain, unspecified  History:        Patient has prior history of Echocardiogram examinations, most                 recent 07/07/2020. Signs/Symptoms:Chest Pain; Risk                 Factors:Hypertension and Dyslipidemia.  Sonographer:    Bernadene Person RDCS Referring Phys: [id number] Biana Haggar K Elmore Hyslop IMPRESSIONS  1. Left ventricular ejection fraction, by estimation, is 50 to 55%. The left ventricle has low normal function. The left ventricle has no regional wall motion abnormalities. Left ventricular diastolic parameters are consistent with Grade I diastolic dysfunction (impaired relaxation).  2. Right ventricular systolic function is normal. The right ventricular size is normal. There is normal pulmonary artery systolic pressure.  3. The mitral valve is normal in structure. No evidence of mitral valve regurgitation. No evidence of mitral stenosis.  4. The aortic valve was not well visualized. Aortic valve regurgitation is not visualized.  5. Aortic dilatation noted. There is mild dilatation of the aortic root, measuring 40 mm.  6. The inferior vena cava is dilated in size with >50% respiratory variability, suggesting right atrial pressure of 8 mmHg. FINDINGS  Left Ventricle: Left ventricular ejection fraction, by estimation, is 50 to 55%. The left ventricle has low normal function. The left ventricle has no regional wall motion abnormalities. The left ventricular internal cavity size was normal in size. There is no left ventricular hypertrophy. Left ventricular diastolic  parameters are consistent with Grade I diastolic dysfunction (impaired relaxation). Right Ventricle: The right ventricular size is normal. No increase in right ventricular wall thickness. Right ventricular systolic function is normal. There is normal pulmonary artery systolic pressure. The tricuspid regurgitant velocity is 1.17 m/s, and  with an assumed right atrial pressure of 8 mmHg, the estimated right ventricular systolic pressure is 09.3 mmHg. Left Atrium: Left atrial size was normal in size. Right Atrium: Right atrial size was normal in size. Pericardium: There is no evidence of pericardial effusion. Mitral Valve: The mitral valve is normal in structure. No evidence of mitral valve regurgitation. No evidence of mitral valve stenosis. Tricuspid Valve: The tricuspid valve is not well visualized. Tricuspid valve regurgitation is not demonstrated. No evidence of tricuspid stenosis. Aortic Valve: The aortic valve was not well visualized. Aortic valve regurgitation is not visualized. Pulmonic Valve: The pulmonic valve was not well visualized. Pulmonic valve regurgitation is not visualized. No evidence of pulmonic stenosis. Aorta: The aortic root is normal in size and structure and aortic dilatation noted. There is mild dilatation of the aortic root, measuring 40 mm. Venous: The inferior vena cava is dilated in size with greater than 50% respiratory variability, suggesting right atrial pressure of 8 mmHg. IAS/Shunts: No atrial level shunt detected by color flow Doppler.  LEFT VENTRICLE PLAX 2D LVIDd:         5.40 cm      Diastology LVIDs:         3.20 cm      LV e' medial:    7.40 cm/s LV PW:         0.80 cm      LV E/e' medial:  9.4 LV IVS:        0.80 cm      LV e' lateral:  9.68 cm/s LVOT diam:     2.30 cm      LV E/e' lateral: 7.2 LV SV:         104 LV SV Index:   46 LVOT Area:     4.15 cm  LV Volumes (MOD) LV vol d, MOD A2C: 99.7 ml LV vol d, MOD A4C: 116.0 ml LV vol s, MOD A2C: 46.4 ml LV vol s, MOD A4C: 52.4  ml LV SV MOD A2C:     53.3 ml LV SV MOD A4C:     116.0 ml LV SV MOD BP:      61.4 ml RIGHT VENTRICLE RV S prime:     10.80 cm/s TAPSE (M-mode): 1.8 cm LEFT ATRIUM             Index        RIGHT ATRIUM           Index LA diam:        3.30 cm 1.45 cm/m   RA Area:     20.10 cm LA Vol (A2C):   43.8 ml 19.23 ml/m  RA Volume:   66.80 ml  29.33 ml/m LA Vol (A4C):   44.1 ml 19.37 ml/m LA Biplane Vol: 43.6 ml 19.15 ml/m  AORTIC VALVE LVOT Vmax:   134.00 cm/s LVOT Vmean:  81.200 cm/s LVOT VTI:    0.250 m  AORTA Ao Root diam: 4.00 cm MITRAL VALVE               TRICUSPID VALVE MV Area (PHT): 3.27 cm    TR Peak grad:   5.5 mmHg MV Decel Time: 232 msec    TR Vmax:        117.00 cm/s MV E velocity: 69.40 cm/s MV A velocity: 69.00 cm/s  SHUNTS MV E/A ratio:  1.01        Systemic VTI:  0.25 m                            Systemic Diam: 2.30 cm Rudean Haskell MD Electronically signed by Rudean Haskell MD Signature Date/Time: 04/23/2022/9:40:06 AM    Final    CT CHEST ABDOMEN PELVIS WO CONTRAST  Result Date: 04/23/2022 CLINICAL DATA:  Sepsis. EXAM: CT CHEST, ABDOMEN AND PELVIS WITHOUT CONTRAST TECHNIQUE: Multidetector CT imaging of the chest, abdomen and pelvis was performed following the standard protocol without IV contrast. RADIATION DOSE REDUCTION: This exam was performed according to the departmental dose-optimization program which includes automated exposure control, adjustment of the mA and/or kV according to patient size and/or use of iterative reconstruction technique. COMPARISON:  Chest CT dated 10/25/2021 and CT abdomen pelvis dated 07/30/2021. FINDINGS: Evaluation of this exam is limited in the absence of intravenous contrast. CT CHEST FINDINGS Cardiovascular: There is no cardiomegaly or pericardial effusion. Three-vessel coronary vascular calcification. Mild atherosclerotic calcification of the thoracic aorta. No aneurysmal dilatation. The central pulmonary arteries are grossly unremarkable.  Mediastinum/Nodes: No hilar or mediastinal adenopathy. The esophagus and thyroid gland are grossly unremarkable. No mediastinal fluid collection. Lungs/Pleura: The lungs are clear. There is no pleural effusion or pneumothorax. The central airways are patent. Musculoskeletal: Degenerative changes of the spine. No acute osseous pathology. CT ABDOMEN PELVIS FINDINGS No intra-abdominal free air or free fluid. Hepatobiliary: The liver is unremarkable. No biliary dilatation. Cholecystectomy. Pancreas: The pancreas is unremarkable as visualized. Spleen: Normal in size without focal abnormality. Adrenals/Urinary Tract: The adrenal glands are unremarkable. Postsurgical changes of the upper  pole of the right kidney as seen on the prior CT. There is no hydronephrosis or nephrolithiasis on either side. The visualized ureters and urinary bladder appear unremarkable. Stomach/Bowel: There is a 2.5 cm duodenal diverticulum. There is no bowel obstruction or active inflammation. The appendix is normal. Vascular/Lymphatic: Advanced aortoiliac atherosclerotic disease. There is a 3.5 cm infrarenal abdominal aortic aneurysm which is poorly evaluated in the absence of contrast but similar to prior CT. The IVC is unremarkable. No portal venous gas. There is no adenopathy. Reproductive: The prostate and seminal vesicles are grossly unremarkable. No pelvic mass. Other: None Musculoskeletal: Osteopenia with degenerative changes of the spine. No acute osseous pathology. IMPRESSION: 1. No acute intrathoracic, abdominal, or pelvic pathology. 2. A 3.5 cm infrarenal abdominal aortic aneurysm similar to prior CT. Recommend follow-up ultrasound every 2 years. This recommendation follows ACR consensus guidelines: White Paper of the ACR Incidental Findings Committee II on Vascular Findings. J Am Coll Radiol 2013; 03:888-280. 3.  Aortic Atherosclerosis (ICD10-I70.0). Electronically Signed   By: Anner Crete M.D.   On: 04/23/2022 01:45   CT HEAD  WO CONTRAST (5MM)  Result Date: 04/23/2022 CLINICAL DATA:  Headache, new onset (Age >= 9y) EXAM: CT HEAD WITHOUT CONTRAST TECHNIQUE: Contiguous axial images were obtained from the base of the skull through the vertex without intravenous contrast. RADIATION DOSE REDUCTION: This exam was performed according to the departmental dose-optimization program which includes automated exposure control, adjustment of the mA and/or kV according to patient size and/or use of iterative reconstruction technique. COMPARISON:  09/18/2015 FINDINGS: Brain: There is atrophy and chronic small vessel disease changes. No acute intracranial abnormality. Specifically, no hemorrhage, hydrocephalus, mass lesion, acute infarction, or significant intracranial injury. Vascular: No hyperdense vessel or unexpected calcification. Skull: No acute calvarial abnormality. Sinuses/Orbits: No acute findings Other: None IMPRESSION: Atrophy, chronic microvascular disease. No acute intracranial abnormality. Electronically Signed   By: Rolm Baptise M.D.   On: 04/23/2022 01:38   DG Chest 2 View  Result Date: 04/22/2022 CLINICAL DATA:  Two day history of shortness of breath and cough EXAM: CHEST - 2 VIEW COMPARISON:  Chest radiograph dated 07/30/2021, CT chest dated 10/25/2021 FINDINGS: Mildly hyperinflated lungs. No focal consolidations. No pleural effusion or pneumothorax. The heart size and mediastinal contours are within normal limits. The visualized skeletal structures are unremarkable. Cholecystectomy clips. IMPRESSION: No active cardiopulmonary disease. Electronically Signed   By: Darrin Nipper M.D.   On: 04/22/2022 21:24       Irish Breisch M.D. [facility] 04/23/2022, 10:00 AM  Available via Epic secure chat 7am-7pm After 7 pm, please refer to night coverage provider listed on amion.

## 2022-04-23 NOTE — Assessment & Plan Note (Addendum)
Pt with nausea, poor PO intake.  Lab workup shows AKI and mild anion gap acidosis of unclear etiology. Pre-renal / ATN due to poor PO intake? IVF No obstruction on CT today Strict intake and output Repeat BMP in AM Hold nephrotoxic meds. UA pending

## 2022-04-23 NOTE — ED Provider Notes (Signed)
[facility] Emergency Department Provider Note MRN:  [id number]  Arrival date & time: 04/23/22     Chief Complaint   Shortness of Breath   History of Present Illness   Jonathan Wilson is a 71 y.o. year-old male with a history of renal cancer, hypertension presenting to the ED with chief complaint of shortness of breath.  Patient endorsing chest pain, shortness of breath, nausea, headache, poor p.o. intake, feels very dehydrated.  Concerned that he is going septic again.  Review of Systems  A thorough review of systems was obtained and all systems are negative except as noted in the HPI and PMH.   Patient's Health History    Past Medical History:  Diagnosis Date   Anxiety    Anxiety and depression 08-27-1998   worse after death of wife 03-28-13   Depression    Diverticulosis of colon August 26, 2009   seen on CT scan in 2009-08-26.    Gout    Hepatitis C before 08-27-1998   Hyperlipidemia    Hypertension    Influenza A 06/2013   acute resp failure, did not require intubation.   Obesity    300 # in 08/26/08, BMI 34 in 04/2013.    Pneumonia    recurrent episodes    Psychosis ([place]) 08/27/1998   s/e of treatment for Hepatitis C.     Renal cancer ([place])    Right lower lobe lung mass 05/2008   resolved on follow up CT 2010-08-27.    Vitamin D deficiency     Past Surgical History:  Procedure Laterality Date   CHOLECYSTECTOMY N/A 01/14/2014   Procedure: LAPAROSCOPIC CHOLECYSTECTOMY WITH INTRAOPERATIVE CHOLANGIOGRAM;  Surgeon: Shann Medal, MD;  Location: [facility];  Service: General;  Laterality: N/A;   MANDIBLE FRACTURE SURGERY     ROBOTIC ASSITED PARTIAL NEPHRECTOMY Right 08/07/2015   Procedure: XI ROBOTIC ASSISTED RIGHT PARTIAL NEPHRECTOMY;  Surgeon: Cleon Gustin, MD;  Location: [facility];  Service: Urology;  Laterality: Right;    Family History  Problem Relation Age of Onset   Diabetes Mother    Arthritis Mother    Cancer Father        pancreatic cancer    Social History    Socioeconomic History   Marital status: Legally Separated    Spouse name: Not on file   Number of children: 3   Years of education: Not on file   Highest education level: Not on file  Occupational History   Occupation: Employed: Architect work    Fish farm manager: UNEMPLOYED  Tobacco Use   Smoking status: Former    Packs/day: 0.25    Years: 50.00    Total pack years: 12.50    Types: Cigarettes   Smokeless tobacco: Never   Tobacco comments:    Patient recently cut back on amount. Prior patient was smoking upwards of  2PPD leading to a 81pack per year history   Substance and Sexual Activity   Alcohol use: No    Alcohol/week: 1.0 standard drink of alcohol    Types: 1 Cans of beer per week    Comment: hx of etoh abuse- 12 years ago    Drug use: No    Types: Heroin    Comment: last use 12 years ago    Sexual activity: Never  Other Topics Concern   Not on file  Social History Narrative   Widowed.  As of 01/2014 is living in appt with his brother-in-law.  Independent of ADLs.   Social  Determinants of Health   Financial Resource Strain: Not on file  Food Insecurity: Not on file  Transportation Needs: Not on file  Physical Activity: Not on file  Stress: Not on file  Social Connections: Not on file  Intimate Partner Violence: Not on file     Physical Exam   Vitals:   04/23/22 0200 04/23/22 0215  BP: 101/69 (!) 156/86  Pulse: 64 62  Resp: 17 18  Temp:    SpO2: 95% 95%    CONSTITUTIONAL: Well-appearing, NAD NEURO/PSYCH:  Alert and oriented x 3, no focal deficits EYES:  eyes equal and reactive ENT/NECK:  no LAD, no JVD CARDIO: Regular rate, well-perfused, normal S1 and S2 PULM:  CTAB no wheezing or rhonchi GI/GU:  non-distended, non-tender MSK/SPINE:  No gross deformities, no edema SKIN:  no rash, atraumatic   *Additional and/or pertinent findings included in MDM below  Diagnostic and Interventional Summary    EKG Interpretation  Date/Time:  Monday April 22 2022 20:41:46 EST Ventricular Rate:  91 PR Interval:  140 QRS Duration: 94 QT Interval:  364 QTC Calculation: 447 R Axis:   -37 Text Interpretation: Normal sinus rhythm with sinus arrhythmia Left axis deviation Incomplete right bundle branch block Abnormal ECG When compared with ECG of 30-Jul-2021 05:37, PREVIOUS ECG IS PRESENT Confirmed by Gerlene Fee [phone]) on 04/23/2022 12:12:37 AM       Labs Reviewed  COMPREHENSIVE METABOLIC PANEL - Abnormal; Notable for the following components:      Result Value   BUN 28 (*)    Creatinine, Ser 2.73 (*)    GFR, Estimated 24 (*)    Anion gap 18 (*)    All other components within normal limits  TROPONIN I (HIGH SENSITIVITY) - Abnormal; Notable for the following components:   Troponin I (High Sensitivity) 18 (*)    All other components within normal limits  TROPONIN I (HIGH SENSITIVITY) - Abnormal; Notable for the following components:   Troponin I (High Sensitivity) 19 (*)    All other components within normal limits  RESP PANEL BY RT-PCR (FLU A&B, COVID) ARPGX2  CULTURE, BLOOD (ROUTINE X 2)  CULTURE, BLOOD (ROUTINE X 2)  CBC WITH DIFFERENTIAL/PLATELET  LACTIC ACID, PLASMA  PROTIME-INR  APTT  LACTIC ACID, PLASMA  URINALYSIS, ROUTINE W REFLEX MICROSCOPIC    CT CHEST ABDOMEN PELVIS WO CONTRAST  Final Result    CT HEAD WO CONTRAST (5MM)  Final Result    DG Chest 2 View  Final Result      Medications  ondansetron (ZOFRAN-ODT) disintegrating tablet 4 mg (4 mg Oral Given 04/22/22 2159)  sodium chloride 0.9 % bolus 1,000 mL (1,000 mLs Intravenous New Bag/Given 04/23/22 0042)  sodium chloride 0.9 % bolus 1,000 mL (1,000 mLs Intravenous New Bag/Given 04/23/22 0042)  ondansetron (ZOFRAN) injection 4 mg (4 mg Intravenous Given 04/23/22 0050)  morphine (PF) 4 MG/ML injection 4 mg (4 mg Intravenous Given 04/23/22 0110)  diphenhydrAMINE (BENADRYL) injection 25 mg (25 mg Intravenous Given 04/23/22 0208)  prochlorperazine (COMPAZINE)  injection 10 mg (10 mg Intravenous Given 04/23/22 0208)     Procedures  /  Critical Care Procedures  ED Course and Medical Decision Making  Initial Impression and Ddx Differential diagnosis includes pneumonia, sepsis, ACS, pneumothorax, gastroenteritis, dehydration, AKI  Past medical/surgical history that increases complexity of ED encounter: Admission for sepsis this year  Interpretation of Diagnostics I personally reviewed the EKG and my interpretation is as follows: Sinus rhythm  Labs reveal acute kidney injury, otherwise  no significant blood count or electrolyte disturbance  Patient Reassessment and Ultimate Disposition/Management     Will admit to medicine  Patient management required discussion with the following services or consulting groups:  Hospitalist Service  Complexity of Problems Addressed Acute illness or injury that poses threat of life of bodily function  Additional Data Reviewed and Analyzed Further history obtained from: Recent discharge summary  Additional Factors Impacting ED Encounter Risk Use of parenteral controlled substances and Consideration of hospitalization  Barth Kirks. Sedonia Small, [place] [email or web address]  Final Clinical Impressions(s) / ED Diagnoses     ICD-10-CM   1. AKI (acute kidney injury) ([place])  N17.9     2. Nonintractable headache, unspecified chronicity pattern, unspecified headache type  R51.9     3. Nausea  R11.0     4. Chest pain, unspecified type  R07.9       ED Discharge Orders     None        Discharge Instructions Discussed with and Provided to Patient:   Discharge Instructions   None      Maudie Flakes, MD 04/23/22 [phone]

## 2022-04-23 NOTE — Progress Notes (Signed)
Echocardiogram 2D Echocardiogram has been performed.  Jonathan Wilson 04/23/2022, 8:58 AM

## 2022-04-23 NOTE — Assessment & Plan Note (Signed)
Home med rec pending Hold nephrotoxic BP meds if taking any.

## 2022-04-23 NOTE — Assessment & Plan Note (Addendum)
Continue home methadone therapy.  Confirmed '90mg'$  daily with patient.

## 2022-04-24 DIAGNOSIS — J441 Chronic obstructive pulmonary disease with (acute) exacerbation: Secondary | ICD-10-CM

## 2022-04-24 DIAGNOSIS — R110 Nausea: Secondary | ICD-10-CM | POA: Diagnosis not present

## 2022-04-24 DIAGNOSIS — N179 Acute kidney failure, unspecified: Secondary | ICD-10-CM | POA: Diagnosis not present

## 2022-04-24 DIAGNOSIS — R519 Headache, unspecified: Secondary | ICD-10-CM | POA: Diagnosis not present

## 2022-04-24 DIAGNOSIS — R079 Chest pain, unspecified: Secondary | ICD-10-CM | POA: Diagnosis not present

## 2022-04-24 LAB — BASIC METABOLIC PANEL
Anion gap: 9 (ref 5–15)
BUN: 31 mg/dL — ABNORMAL HIGH (ref 8–23)
CO2: 29 mmol/L (ref 22–32)
Calcium: 9.3 mg/dL (ref 8.9–10.3)
Chloride: 100 mmol/L (ref 98–111)
Creatinine, Ser: 1.66 mg/dL — ABNORMAL HIGH (ref 0.61–1.24)
GFR, Estimated: 44 mL/min — ABNORMAL LOW (ref >60)
Glucose, Bld: 115 mg/dL — ABNORMAL HIGH (ref 70–99)
Potassium: 5.2 mmol/L — ABNORMAL HIGH (ref 3.5–5.1)
Sodium: 138 mmol/L (ref 135–145)

## 2022-04-24 LAB — CBC
HCT: 36.1 % — ABNORMAL LOW (ref 39.0–52.0)
Hemoglobin: 12 g/dL — ABNORMAL LOW (ref 13.0–17.0)
MCH: 31.8 pg (ref 26.0–34.0)
MCHC: 33.2 g/dL (ref 30.0–36.0)
MCV: 95.8 fL (ref 80.0–100.0)
Platelets: 153 10*3/uL (ref 150–400)
RBC: 3.77 MIL/uL — ABNORMAL LOW (ref 4.22–5.81)
RDW: 12.1 % (ref 11.5–15.5)
WBC: 8.2 10*3/uL (ref 4.0–10.5)
nRBC: 0 % (ref 0.0–0.2)

## 2022-04-24 LAB — URINE CULTURE: Culture: NO GROWTH

## 2022-04-24 MED ORDER — FLUTICASONE FUROATE-VILANTEROL 200-25 MCG/ACT IN AEPB
1.0000 | INHALATION_SPRAY | Freq: Every day | RESPIRATORY_TRACT | Status: DC
  Administered 2022-04-24 – 2022-04-26 (×3): 1 via RESPIRATORY_TRACT
  Filled 2022-04-24 (×2): qty 28

## 2022-04-24 MED ORDER — IPRATROPIUM-ALBUTEROL 0.5-2.5 (3) MG/3ML IN SOLN: 3.0000 mL | RESPIRATORY_TRACT | Status: DC | PRN

## 2022-04-24 MED ORDER — SODIUM ZIRCONIUM CYCLOSILICATE 10 G PO PACK
10.0000 g | PACK | Freq: Once | ORAL | Status: AC
  Administered 2022-04-24: 10 g via ORAL
  Filled 2022-04-24: qty 1

## 2022-04-24 MED ORDER — METHYLPREDNISOLONE SODIUM SUCC 125 MG IJ SOLR
125.0000 mg | Freq: Once | INTRAMUSCULAR | Status: AC
  Administered 2022-04-24: 125 mg via INTRAVENOUS
  Filled 2022-04-24: qty 2

## 2022-04-24 MED ORDER — METHYLPREDNISOLONE SODIUM SUCC 125 MG IJ SOLR
80.0000 mg | Freq: Every day | INTRAMUSCULAR | Status: DC
  Administered 2022-04-25: 80 mg via INTRAVENOUS
  Filled 2022-04-24: qty 2

## 2022-04-24 MED ORDER — IPRATROPIUM-ALBUTEROL 0.5-2.5 (3) MG/3ML IN SOLN
3.0000 mL | Freq: Four times a day (QID) | RESPIRATORY_TRACT | Status: DC
  Administered 2022-04-24 (×3): 3 mL via RESPIRATORY_TRACT
  Filled 2022-04-24 (×3): qty 3

## 2022-04-24 MED ORDER — DOXYCYCLINE HYCLATE 100 MG PO TABS
100.0000 mg | ORAL_TABLET | Freq: Two times a day (BID) | ORAL | Status: DC
  Administered 2022-04-24 – 2022-04-26 (×5): 100 mg via ORAL
  Filled 2022-04-24 (×5): qty 1

## 2022-04-24 MED ORDER — MELATONIN 3 MG PO TABS
3.0000 mg | ORAL_TABLET | Freq: Every evening | ORAL | Status: DC | PRN
  Administered 2022-04-24 – 2022-04-25 (×2): 3 mg via ORAL
  Filled 2022-04-24 (×2): qty 1

## 2022-04-24 NOTE — Care Management Obs Status (Signed)
[facility] NOTIFICATION   Patient Details  Name: LEDON WEIHE MRN: [id number] Date of Birth: 04-18-1951   Medicare Observation Status Notification Given:  Yes    Tom-Johnson, Renea Ee, RN 04/24/2022, 12:26 PM

## 2022-04-24 NOTE — Progress Notes (Signed)
PT Cancellation Note  Patient Details Name: CLEMENT DENEAULT MRN: [id number] DOB: [birth year]   Cancelled Treatment:    Reason Eval/Treat Not Completed: PT screened, no needs identified, will sign off; patient up ambulating independent in the hallway and denies need for PT currently.  Will sign off.    Reginia Naas 04/24/2022, 9:55 AM Magda Kiel, PT Acute Rehabilitation Services Office:[phone] 04/24/2022

## 2022-04-24 NOTE — Progress Notes (Signed)
Received from ED, alert and oriented, ambulatory. Still complaining of headache and bilateral chest pain, tylenol given. His brother in law who has dementia is at the bedside, he said no one can take care of him at home.

## 2022-04-24 NOTE — Progress Notes (Addendum)
[facility]                                                                              Viaan Knippenberg, is a 71 y.o. male, DOB - Jun 26, 1950, POE:[id number] Admit date - 04/22/2022    Outpatient Primary MD for the patient is Vicenta Aly, [place]  LOS - 0  days  Chief Complaint  Patient presents with   Shortness of Breath       Brief summary   Patient is a 71 year old male with history of renal cell CA, HCV status posttreatment, HTN, HLD, opiate dependence on chronic methadone, COPD, history of smoking for 50 years.  Patient presented with multiple complaints including chest pain, shortness of breath, nausea, poor p.o. intake dehydration, headache No fevers, no cough, focal neurological deficits or slurred speech. Patient was admitted for further work-up. In ED, temp 99.3 F, respiratory rate 18, pulse 93, BP 111/76, O2 sats 97% on 2 L.  BMET showed BUN 28, creatinine 2.7, was 1.4 on 08/01/2021 Troponin 19-18 Lactic acid 1.3-0.9  CT head showed atrophy with chronic microvascular ischemic disease, no acute intracranial abnormality.  Chest x-ray showed no active cardiopulmonary abnormality.  CT abdomen pelvis, chest showed no acute intrathoracic abdominal or pelvic pathology.  3.5 cm infrarenal abdominal aortic aneurysm similar to prior  Assessment & Plan    Principal Problem:   AKI (acute kidney injury) ([place]) superimposed on CKD stage IIIb -Likely worsened due to dehydration, poor p.o. intake, medications including lisinopril HCTZ, NSAIDs (ibuprofen) -CT abdomen pelvis did not show any hydronephrosis or obstruction -Creatinine 2.7 on admission, was 1.4 on 08/01/2021, worsened to 2.9 on 11/21 -Patient was placed on IV fluid hydration, creatinine improving to 1.6 today -Continue to hold lisinopril, HCTZ  -UA negative for UTI    Active Problems:   High anion gap metabolic acidosis -Anion gap 18 on admission, unclear etiology, no lactic acidosis. ?starvation ketosis  due to poor p.o. intake -BHB 0.2, tox screen positive for amphetamines -Resolved, creatinine improving  Hyperkalemia Potassium 5.2, placed on Lokelma x1  Acute COPD exacerbation -CT chest done without contrast showed no acute intrathoracic pathology. - troponins flat, EKG showed no acute ST-T wave changes suggestive of ischemia -On exam, bilateral wheezing, will DC IV fluids.  Chest pain resolved. -2D echo showed EF of 50 to 55%, G1 DD -Placed on scheduled DuoNebs, Breo Ellipta, flutter valve, doxycycline (will need albuterol inhaler and Breo at discharge, does not have any inhalers at home) -Solu-Medrol 125 mg IV x1, placed on Solu-Medrol 40 mg every 12 hours -Counseled on smoking cessation    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[place]) -Continue methadone maintenance therapy    Hepatitis C -Status posttreatment, cirrhosis on [place], outpatient follow-up with GI.  No ascites, hepatic encephalopathy or transaminitis.    Essential hypertension -BP stable, continue to hold lisinopril, HCTZ    Headache -No FND's, resolved, possibly migraine headache  Infrarenal abdominal aortic aneurysm -CT abdomen and pelvis showed 3.5 cm infrarenal AAA similar to prior CT, recommended follow-up ultrasound every 2 years.  Code Status: Full CODE STATUS DVT Prophylaxis:  heparin injection 5,000 Units Start: 04/23/22 1400  Level of Care: Level of care: Telemetry Medical Family Communication: Updated patient's family member at bedside   Disposition Plan:      Remains inpatient appropriate: Possibly DC home tomorrow if wheezing is improved.   Procedures:  2D echo  Consultants:   None Antimicrobials: None    Medications  doxycycline  100 mg Oral Q12H   fluticasone furoate-vilanterol  1 puff Inhalation Daily   heparin  5,000 Units Subcutaneous Q8H   ipratropium-albuterol  3 mL Nebulization Q6H   methadone  90 mg Oral Q breakfast   [START ON 04/25/2022] methylPREDNISolone (SOLU-MEDROL)  injection  80 mg Intravenous Daily      Subjective:   Bentlee Benningfield was seen and examined today.  Still has some shortness of breath, noted to be wheezing bilateral lungs.  Occasional coughing, no chest pain or headache today.  No nausea or vomiting.  No fevers  Objective:   Vitals:   04/24/22 0040 04/24/22 0540 04/24/22 0835 04/24/22 0915  BP: (!) 163/76 135/76 126/79   Pulse:  (!) 57 64   Resp:  16 18   Temp:  97.8 F (36.6 C) 98.1 F (36.7 C)   TempSrc:  Oral Oral   SpO2:  97% 97% 97%  Weight:      Height:        Intake/Output Summary (Last 24 hours) at 04/24/2022 1045 Last data filed at 04/24/2022 1001 Gross per 24 hour  Intake 1872 ml  Output 425 ml  Net 1447 ml     Wt Readings from Last 3 Encounters:  04/23/22 101 kg  03/25/22 101.2 kg  08/28/21 101.4 kg   Physical Exam General: Alert and oriented x 3, NAD Cardiovascular: S1 S2 clear, RRR.  Respiratory: Bilateral extra wheezing Gastrointestinal: Soft, nontender, nondistended, NBS Ext: no pedal edema bilaterally Neuro: no new deficits Psych: Normal affect     Data Reviewed:  I have personally reviewed following labs    CBC Lab Results  Component Value Date   WBC 8.2 04/24/2022   RBC 3.77 (L) 04/24/2022   HGB 12.0 (L) 04/24/2022   HCT 36.1 (L) 04/24/2022   MCV 95.8 04/24/2022   MCH 31.8 04/24/2022   PLT 153 04/24/2022   MCHC 33.2 04/24/2022   RDW 12.1 04/24/2022   LYMPHSABS 1.4 04/22/2022   MONOABS 0.8 04/22/2022   EOSABS 0.1 04/22/2022   BASOSABS 0.0 22/07/5425     Last metabolic panel Lab Results  Component Value Date   NA 138 04/24/2022   K 5.2 (H) 04/24/2022   CL 100 04/24/2022   CO2 29 04/24/2022   BUN 31 (H) 04/24/2022   CREATININE 1.66 (H) 04/24/2022   GLUCOSE 115 (H) 04/24/2022   GFRNONAA 44 (L) 04/24/2022   GFRAA 52 (L) 01/22/2019   CALCIUM 9.3 04/24/2022   PHOS 4.4 04/30/2015   PROT 7.5 04/22/2022   ALBUMIN 4.6 04/22/2022   BILITOT 0.8 04/22/2022   ALKPHOS 79  04/22/2022   AST 22 04/22/2022   ALT 12 04/22/2022   ANIONGAP 9 04/24/2022    CBG (last 3)  No results for input(s): "GLUCAP" in the last 72 hours.    Coagulation Profile: Recent Labs  Lab 04/23/22 0045  INR 1.1     Radiology Studies: I have personally reviewed the imaging studies  ECHOCARDIOGRAM COMPLETE  Result Date: 04/23/2022    ECHOCARDIOGRAM REPORT   Patient Name:   IMRAN NUON Mumpower Date of Exam: 04/23/2022 Medical Rec #:  [id number]     Height:  74.0 in Accession #:    [id number]    Weight:       223.2 lb Date of Birth:  March 16, 1951     BSA:          2.277 m Patient Age:    58 years      BP:           110/77 mmHg Patient Gender: M             HR:           52 bpm. Exam Location:  Inpatient Procedure: 2D Echo, Cardiac Doppler and Color Doppler Indications:    R07.9* Chest pain, unspecified  History:        Patient has prior history of Echocardiogram examinations, most                 recent 07/07/2020. Signs/Symptoms:Chest Pain; Risk                 Factors:Hypertension and Dyslipidemia.  Sonographer:    Bernadene Person RDCS Referring Phys: [id number] Laketra Bowdish K Zillah Alexie IMPRESSIONS  1. Left ventricular ejection fraction, by estimation, is 50 to 55%. The left ventricle has low normal function. The left ventricle has no regional wall motion abnormalities. Left ventricular diastolic parameters are consistent with Grade I diastolic dysfunction (impaired relaxation).  2. Right ventricular systolic function is normal. The right ventricular size is normal. There is normal pulmonary artery systolic pressure.  3. The mitral valve is normal in structure. No evidence of mitral valve regurgitation. No evidence of mitral stenosis.  4. The aortic valve was not well visualized. Aortic valve regurgitation is not visualized.  5. Aortic dilatation noted. There is mild dilatation of the aortic root, measuring 40 mm.  6. The inferior vena cava is dilated in size with >50% respiratory variability, suggesting right atrial  pressure of 8 mmHg. FINDINGS  Left Ventricle: Left ventricular ejection fraction, by estimation, is 50 to 55%. The left ventricle has low normal function. The left ventricle has no regional wall motion abnormalities. The left ventricular internal cavity size was normal in size. There is no left ventricular hypertrophy. Left ventricular diastolic parameters are consistent with Grade I diastolic dysfunction (impaired relaxation). Right Ventricle: The right ventricular size is normal. No increase in right ventricular wall thickness. Right ventricular systolic function is normal. There is normal pulmonary artery systolic pressure. The tricuspid regurgitant velocity is 1.17 m/s, and  with an assumed right atrial pressure of 8 mmHg, the estimated right ventricular systolic pressure is 27.0 mmHg. Left Atrium: Left atrial size was normal in size. Right Atrium: Right atrial size was normal in size. Pericardium: There is no evidence of pericardial effusion. Mitral Valve: The mitral valve is normal in structure. No evidence of mitral valve regurgitation. No evidence of mitral valve stenosis. Tricuspid Valve: The tricuspid valve is not well visualized. Tricuspid valve regurgitation is not demonstrated. No evidence of tricuspid stenosis. Aortic Valve: The aortic valve was not well visualized. Aortic valve regurgitation is not visualized. Pulmonic Valve: The pulmonic valve was not well visualized. Pulmonic valve regurgitation is not visualized. No evidence of pulmonic stenosis. Aorta: The aortic root is normal in size and structure and aortic dilatation noted. There is mild dilatation of the aortic root, measuring 40 mm. Venous: The inferior vena cava is dilated in size with greater than 50% respiratory variability, suggesting right atrial pressure of 8 mmHg. IAS/Shunts: No atrial level shunt detected by color flow Doppler.  LEFT VENTRICLE PLAX 2D LVIDd:  5.40 cm      Diastology LVIDs:         3.20 cm      LV e' medial:     7.40 cm/s LV PW:         0.80 cm      LV E/e' medial:  9.4 LV IVS:        0.80 cm      LV e' lateral:   9.68 cm/s LVOT diam:     2.30 cm      LV E/e' lateral: 7.2 LV SV:         104 LV SV Index:   46 LVOT Area:     4.15 cm  LV Volumes (MOD) LV vol d, MOD A2C: 99.7 ml LV vol d, MOD A4C: 116.0 ml LV vol s, MOD A2C: 46.4 ml LV vol s, MOD A4C: 52.4 ml LV SV MOD A2C:     53.3 ml LV SV MOD A4C:     116.0 ml LV SV MOD BP:      61.4 ml RIGHT VENTRICLE RV S prime:     10.80 cm/s TAPSE (M-mode): 1.8 cm LEFT ATRIUM             Index        RIGHT ATRIUM           Index LA diam:        3.30 cm 1.45 cm/m   RA Area:     20.10 cm LA Vol (A2C):   43.8 ml 19.23 ml/m  RA Volume:   66.80 ml  29.33 ml/m LA Vol (A4C):   44.1 ml 19.37 ml/m LA Biplane Vol: 43.6 ml 19.15 ml/m  AORTIC VALVE LVOT Vmax:   134.00 cm/s LVOT Vmean:  81.200 cm/s LVOT VTI:    0.250 m  AORTA Ao Root diam: 4.00 cm MITRAL VALVE               TRICUSPID VALVE MV Area (PHT): 3.27 cm    TR Peak grad:   5.5 mmHg MV Decel Time: 232 msec    TR Vmax:        117.00 cm/s MV E velocity: 69.40 cm/s MV A velocity: 69.00 cm/s  SHUNTS MV E/A ratio:  1.01        Systemic VTI:  0.25 m                            Systemic Diam: 2.30 cm Rudean Haskell MD Electronically signed by Rudean Haskell MD Signature Date/Time: 04/23/2022/9:40:06 AM    Final    CT CHEST ABDOMEN PELVIS WO CONTRAST  Result Date: 04/23/2022 CLINICAL DATA:  Sepsis. EXAM: CT CHEST, ABDOMEN AND PELVIS WITHOUT CONTRAST TECHNIQUE: Multidetector CT imaging of the chest, abdomen and pelvis was performed following the standard protocol without IV contrast. RADIATION DOSE REDUCTION: This exam was performed according to the departmental dose-optimization program which includes automated exposure control, adjustment of the mA and/or kV according to patient size and/or use of iterative reconstruction technique. COMPARISON:  Chest CT dated 10/25/2021 and CT abdomen pelvis dated 07/30/2021. FINDINGS:  Evaluation of this exam is limited in the absence of intravenous contrast. CT CHEST FINDINGS Cardiovascular: There is no cardiomegaly or pericardial effusion. Three-vessel coronary vascular calcification. Mild atherosclerotic calcification of the thoracic aorta. No aneurysmal dilatation. The central pulmonary arteries are grossly unremarkable. Mediastinum/Nodes: No hilar or mediastinal adenopathy. The esophagus and thyroid gland are grossly unremarkable. No mediastinal  fluid collection. Lungs/Pleura: The lungs are clear. There is no pleural effusion or pneumothorax. The central airways are patent. Musculoskeletal: Degenerative changes of the spine. No acute osseous pathology. CT ABDOMEN PELVIS FINDINGS No intra-abdominal free air or free fluid. Hepatobiliary: The liver is unremarkable. No biliary dilatation. Cholecystectomy. Pancreas: The pancreas is unremarkable as visualized. Spleen: Normal in size without focal abnormality. Adrenals/Urinary Tract: The adrenal glands are unremarkable. Postsurgical changes of the upper pole of the right kidney as seen on the prior CT. There is no hydronephrosis or nephrolithiasis on either side. The visualized ureters and urinary bladder appear unremarkable. Stomach/Bowel: There is a 2.5 cm duodenal diverticulum. There is no bowel obstruction or active inflammation. The appendix is normal. Vascular/Lymphatic: Advanced aortoiliac atherosclerotic disease. There is a 3.5 cm infrarenal abdominal aortic aneurysm which is poorly evaluated in the absence of contrast but similar to prior CT. The IVC is unremarkable. No portal venous gas. There is no adenopathy. Reproductive: The prostate and seminal vesicles are grossly unremarkable. No pelvic mass. Other: None Musculoskeletal: Osteopenia with degenerative changes of the spine. No acute osseous pathology. IMPRESSION: 1. No acute intrathoracic, abdominal, or pelvic pathology. 2. A 3.5 cm infrarenal abdominal aortic aneurysm similar to prior  CT. Recommend follow-up ultrasound every 2 years. This recommendation follows ACR consensus guidelines: White Paper of the ACR Incidental Findings Committee II on Vascular Findings. J Am Coll Radiol 2013; 02:637-858. 3.  Aortic Atherosclerosis (ICD10-I70.0). Electronically Signed   By: Anner Crete M.D.   On: 04/23/2022 01:45   CT HEAD WO CONTRAST (5MM)  Result Date: 04/23/2022 CLINICAL DATA:  Headache, new onset (Age >= 73y) EXAM: CT HEAD WITHOUT CONTRAST TECHNIQUE: Contiguous axial images were obtained from the base of the skull through the vertex without intravenous contrast. RADIATION DOSE REDUCTION: This exam was performed according to the departmental dose-optimization program which includes automated exposure control, adjustment of the mA and/or kV according to patient size and/or use of iterative reconstruction technique. COMPARISON:  09/18/2015 FINDINGS: Brain: There is atrophy and chronic small vessel disease changes. No acute intracranial abnormality. Specifically, no hemorrhage, hydrocephalus, mass lesion, acute infarction, or significant intracranial injury. Vascular: No hyperdense vessel or unexpected calcification. Skull: No acute calvarial abnormality. Sinuses/Orbits: No acute findings Other: None IMPRESSION: Atrophy, chronic microvascular disease. No acute intracranial abnormality. Electronically Signed   By: Rolm Baptise M.D.   On: 04/23/2022 01:38   DG Chest 2 View  Result Date: 04/22/2022 CLINICAL DATA:  Two day history of shortness of breath and cough EXAM: CHEST - 2 VIEW COMPARISON:  Chest radiograph dated 07/30/2021, CT chest dated 10/25/2021 FINDINGS: Mildly hyperinflated lungs. No focal consolidations. No pleural effusion or pneumothorax. The heart size and mediastinal contours are within normal limits. The visualized skeletal structures are unremarkable. Cholecystectomy clips. IMPRESSION: No active cardiopulmonary disease. Electronically Signed   By: Darrin Nipper M.D.   On:  04/22/2022 21:24       Modesta Sammons M.D. [facility] 04/24/2022, 10:45 AM  Available via Epic secure chat 7am-7pm After 7 pm, please refer to night coverage provider listed on amion.

## 2022-04-25 DIAGNOSIS — E8729 Other acidosis: Secondary | ICD-10-CM | POA: Diagnosis not present

## 2022-04-25 DIAGNOSIS — I129 Hypertensive chronic kidney disease with stage 1 through stage 4 chronic kidney disease, or unspecified chronic kidney disease: Secondary | ICD-10-CM | POA: Diagnosis present

## 2022-04-25 DIAGNOSIS — J441 Chronic obstructive pulmonary disease with (acute) exacerbation: Secondary | ICD-10-CM | POA: Diagnosis present

## 2022-04-25 DIAGNOSIS — N1832 Chronic kidney disease, stage 3b: Secondary | ICD-10-CM

## 2022-04-25 DIAGNOSIS — E872 Acidosis, unspecified: Secondary | ICD-10-CM | POA: Diagnosis present

## 2022-04-25 DIAGNOSIS — Z833 Family history of diabetes mellitus: Secondary | ICD-10-CM | POA: Diagnosis not present

## 2022-04-25 DIAGNOSIS — I10 Essential (primary) hypertension: Secondary | ICD-10-CM | POA: Diagnosis not present

## 2022-04-25 DIAGNOSIS — Z87891 Personal history of nicotine dependence: Secondary | ICD-10-CM | POA: Diagnosis not present

## 2022-04-25 DIAGNOSIS — Z8261 Family history of arthritis: Secondary | ICD-10-CM | POA: Diagnosis not present

## 2022-04-25 DIAGNOSIS — Z8507 Personal history of malignant neoplasm of pancreas: Secondary | ICD-10-CM | POA: Diagnosis not present

## 2022-04-25 DIAGNOSIS — Z7982 Long term (current) use of aspirin: Secondary | ICD-10-CM | POA: Diagnosis not present

## 2022-04-25 DIAGNOSIS — I4510 Unspecified right bundle-branch block: Secondary | ICD-10-CM | POA: Diagnosis present

## 2022-04-25 DIAGNOSIS — Z1152 Encounter for screening for COVID-19: Secondary | ICD-10-CM | POA: Diagnosis not present

## 2022-04-25 DIAGNOSIS — E860 Dehydration: Secondary | ICD-10-CM | POA: Diagnosis present

## 2022-04-25 DIAGNOSIS — K7460 Unspecified cirrhosis of liver: Secondary | ICD-10-CM | POA: Diagnosis present

## 2022-04-25 DIAGNOSIS — I7143 Infrarenal abdominal aortic aneurysm, without rupture: Secondary | ICD-10-CM | POA: Diagnosis present

## 2022-04-25 DIAGNOSIS — F1190 Opioid use, unspecified, uncomplicated: Secondary | ICD-10-CM | POA: Diagnosis present

## 2022-04-25 DIAGNOSIS — Z79899 Other long term (current) drug therapy: Secondary | ICD-10-CM | POA: Diagnosis not present

## 2022-04-25 DIAGNOSIS — Z8619 Personal history of other infectious and parasitic diseases: Secondary | ICD-10-CM | POA: Diagnosis not present

## 2022-04-25 DIAGNOSIS — Z85528 Personal history of other malignant neoplasm of kidney: Secondary | ICD-10-CM | POA: Diagnosis not present

## 2022-04-25 DIAGNOSIS — N179 Acute kidney failure, unspecified: Secondary | ICD-10-CM | POA: Diagnosis present

## 2022-04-25 DIAGNOSIS — E785 Hyperlipidemia, unspecified: Secondary | ICD-10-CM | POA: Diagnosis present

## 2022-04-25 DIAGNOSIS — R0602 Shortness of breath: Secondary | ICD-10-CM | POA: Diagnosis present

## 2022-04-25 DIAGNOSIS — F1120 Opioid dependence, uncomplicated: Secondary | ICD-10-CM | POA: Diagnosis not present

## 2022-04-25 DIAGNOSIS — E875 Hyperkalemia: Secondary | ICD-10-CM | POA: Diagnosis present

## 2022-04-25 LAB — BASIC METABOLIC PANEL
Anion gap: 11 (ref 5–15)
BUN: 29 mg/dL — ABNORMAL HIGH (ref 8–23)
CO2: 28 mmol/L (ref 22–32)
Calcium: 9.6 mg/dL (ref 8.9–10.3)
Chloride: 97 mmol/L — ABNORMAL LOW (ref 98–111)
Creatinine, Ser: 1.58 mg/dL — ABNORMAL HIGH (ref 0.61–1.24)
GFR, Estimated: 47 mL/min — ABNORMAL LOW (ref >60)
Glucose, Bld: 103 mg/dL — ABNORMAL HIGH (ref 70–99)
Potassium: 5.2 mmol/L — ABNORMAL HIGH (ref 3.5–5.1)
Sodium: 136 mmol/L (ref 135–145)

## 2022-04-25 MED ORDER — SODIUM CHLORIDE 0.9 % IV SOLN: INTRAVENOUS | Status: AC

## 2022-04-25 MED ORDER — SODIUM ZIRCONIUM CYCLOSILICATE 5 G PO PACK
5.0000 g | PACK | Freq: Once | ORAL | Status: AC
  Administered 2022-04-25: 5 g via ORAL
  Filled 2022-04-25: qty 1

## 2022-04-25 NOTE — Plan of Care (Signed)

## 2022-04-25 NOTE — Progress Notes (Addendum)
[facility]  PROGRESS NOTE  Jonathan Wilson NLG:[id number] DOB: [birth year] DOA: 04/22/2022 PCP: Vicenta Aly, FNP   Brief HPI:   71 year old male with history of renal cell carcinoma, activity status posttreatment, hypertension, hyperlipidemia, opioid dependence on chronic methadone, COPD, tobacco abuse for 50 years presented with multiple complaints including chest pain, shortness of breath, nausea, poor p.o. intake, dehydration, headache.  In the ED lab work showed creatinine 2.7, creatinine was 1.4 on 08/01/2021.  Troponin 19-18, lactic acid 1.3-0.9.  CT head showed atrophy with chronic microvascular ischemic disease, no acute intracranial abnormality.  Chest x-ray was unremarkable.  CT abdomen/pelvis/chest showed no acute intrathoracic intra-abdominal or pelvic pathology.  3.5 cm infrarenal abdominal aortic aneurysm was noted which was similar to prior.    Subjective   This morning patient says the bleeding has significantly improved.   Assessment/Plan:     Acute kidney injury on CKD stage IIIb -Patient baseline creatinine 1.4 as of March 2023 -Presented with creatinine of 2.9 on 11/21, likely due to poor p.o. intake, HCTZ, lisinopril and NSAIDs use -CT abdomen/pelvis showed no hydronephrosis or obstruction -Continue to hold HCTZ, lisinopril -We will start normal saline at 75 mL/h  UA is clear -Urine culture showed no growth  High anion gap metabolic acidosis -Resolved -Likely starvation ketosis due to poor p.o. intake -Anion gap was 18 on admission, no lactic acidosis  Mild hyperkalemia -Potassium is 5.2 -We will give  Lokelma 5 g p.o. x1 -Started on normal saline at 75 mL/h as above -Follow BMP in am  CAD exacerbation -Significantly improved -We will discontinue Solu-Medrol -We will start prednisone taper from tomorrow morning --Placed on scheduled DuoNebs, Breo Ellipta, flutter valve, doxycycline (will need albuterol inhaler and Breo at discharge, does not  have any inhalers at home)   Methadone maintenance therapy treatment -Continue methadone for maintenance therapy  Hepatitis C -S/p treatment -Cirrhosis noted on ultrasound -Follow as outpatient  Hypertension -Blood pressure is stable -Continue to hold HCTZ/lisinopril -We will adjust medications before discharge  Headache -No focal neurological deficits -CT head unremarkable -Resolved  Infrarenal abdominal aortic aneurysm -CT abdomen and pelvis showed 3.5 cm infrarenal AAA similar to prior CT - recommended follow-up ultrasound every 2 years.       Medications     doxycycline  100 mg Oral Q12H   fluticasone furoate-vilanterol  1 puff Inhalation Daily   heparin  5,000 Units Subcutaneous Q8H   methadone  90 mg Oral Q breakfast   sodium zirconium cyclosilicate  5 g Oral Once     Data Reviewed:   CBG:  No results for input(s): "GLUCAP" in the last 168 hours.  SpO2: 96 % O2 Flow Rate (L/min): 2 L/min    Vitals:   04/24/22 2042 04/24/22 2108 04/25/22 0527 04/25/22 0859  BP: (!) 140/84  127/72 132/72  Pulse: 68  (!) 57 67  Resp: '17  18 18  '$ Temp: 98.1 F (36.7 C)  98 F (36.7 C) 98 F (36.7 C)  TempSrc: Oral  Oral Oral  SpO2: 97% 96% 95% 96%  Weight:      Height:          Data Reviewed:  Basic Metabolic Panel: Recent Labs  Lab 04/22/22 2047 04/23/22 0045 04/24/22 0358 04/25/22 0144  NA 141 143 138 136  K 4.1 4.6 5.2* 5.2*  CL 100 98 100 97*  CO2 '23 28 29 28  '$ GLUCOSE 93 114* 115* 103*  BUN 28* 32* 31* 29*  CREATININE 2.73* 2.90* 1.66*  1.58*  CALCIUM 10.0 10.3 9.3 9.6    CBC: Recent Labs  Lab 04/22/22 2047 04/23/22 0045 04/24/22 0358  WBC 8.9 8.2 8.2  NEUTROABS 6.6  --   --   HGB 15.3 14.9 12.0*  HCT 45.4 45.0 36.1*  MCV 93.6 95.7 95.8  PLT 229 251 153    LFT Recent Labs  Lab 04/22/22 2047  AST 22  ALT 12  ALKPHOS 79  BILITOT 0.8  PROT 7.5  ALBUMIN 4.6     Antibiotics: Anti-infectives (From admission, onward)     Start     Dose/Rate Route Frequency Ordered Stop   04/24/22 0815  doxycycline (VIBRA-TABS) tablet 100 mg        100 mg Oral Every 12 hours 04/24/22 0718          DVT prophylaxis: Heparin  Code Status: Full code  Family Communication: Brother-in-law at bedside   CONSULTS none   Objective    Physical Examination:   General: Appears in no acute distress Cardiovascular: S1-S2, regular, no murmur auscultated Respiratory: Clear to auscultation bilaterally Abdomen: Soft, nontender, no organomegaly Extremities: No edema in the lower extremities Neurologic: Alert, oriented x3, intact insight and judgment   Status is: Inpatient:             Oswald Hillock   [facility] If 7PM-7AM, please contact night-coverage at [email or web address], Office  [phone]   04/25/2022, 9:34 AM  LOS: 0 days

## 2022-04-26 ENCOUNTER — Other Ambulatory Visit (HOSPITAL_COMMUNITY): Payer: Self-pay

## 2022-04-26 LAB — BASIC METABOLIC PANEL
Anion gap: 12 (ref 5–15)
BUN: 28 mg/dL — ABNORMAL HIGH (ref 8–23)
CO2: 29 mmol/L (ref 22–32)
Calcium: 9.6 mg/dL (ref 8.9–10.3)
Chloride: 98 mmol/L (ref 98–111)
Creatinine, Ser: 1.32 mg/dL — ABNORMAL HIGH (ref 0.61–1.24)
GFR, Estimated: 58 mL/min — ABNORMAL LOW (ref >60)
Glucose, Bld: 89 mg/dL (ref 70–99)
Potassium: 4.7 mmol/L (ref 3.5–5.1)
Sodium: 139 mmol/L (ref 135–145)

## 2022-04-26 MED ORDER — FLUTICASONE FUROATE-VILANTEROL 100-25 MCG/ACT IN AEPB
1.0000 | INHALATION_SPRAY | Freq: Every day | RESPIRATORY_TRACT | 0 refills | Status: DC
  Filled 2022-04-26: qty 60, 30d supply, fill #0

## 2022-04-26 MED ORDER — DOXYCYCLINE HYCLATE 100 MG PO TABS
100.0000 mg | ORAL_TABLET | Freq: Two times a day (BID) | ORAL | 0 refills | Status: DC
  Filled 2022-04-26: qty 4, 2d supply, fill #0

## 2022-04-26 MED ORDER — LISINOPRIL 10 MG PO TABS
10.0000 mg | ORAL_TABLET | Freq: Every day | ORAL | 2 refills | Status: DC
  Filled 2022-04-26: qty 30, 30d supply, fill #0

## 2022-04-26 MED ORDER — ALBUTEROL SULFATE HFA 108 (90 BASE) MCG/ACT IN AERS
2.0000 | INHALATION_SPRAY | Freq: Four times a day (QID) | RESPIRATORY_TRACT | 0 refills | Status: DC | PRN
  Filled 2022-04-26: qty 6.7, 25d supply, fill #0
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
## 2022-04-26 NOTE — Progress Notes (Signed)
Nsg Discharge Note  Admit Date:  04/22/2022 Discharge date: 04/26/2022   [name] to be D/C'd Home per MD order.  AVS completed. Patient/caregiver able to verbalize understanding.  Discharge Medication: Allergies as of 04/26/2022   No Known Allergies      Medication List     STOP taking these medications    ibuprofen 200 MG tablet Commonly known as: ADVIL   lisinopril-hydrochlorothiazide 20-12.5 MG tablet Commonly known as: ZESTORETIC   Stiolto Respimat 2.5-2.5 MCG/ACT Aers Generic drug: Tiotropium Bromide-Olodaterol       TAKE these medications    albuterol 108 (90 Base) MCG/ACT inhaler Commonly known as: VENTOLIN HFA Inhale 2 puffs into the lungs every 6 (six) hours as needed for shortness of breath.   amLODipine 5 MG tablet Commonly known as: NORVASC Take 5 mg by mouth daily.   aspirin EC 81 MG tablet Take 81 mg by mouth every morning. Swallow whole.   doxycycline 100 MG tablet Commonly known as: VIBRA-TABS Take 1 tablet (100 mg total) by mouth every 12 (twelve) hours.   fluticasone furoate-vilanterol 100-25 MCG/ACT Aepb Commonly known as: Breo Ellipta Inhale 1 puff into the lungs daily.   Linzess 145 MCG Caps capsule Generic drug: linaclotide Take 145 mcg by mouth daily as needed (constipation).   lisinopril 10 MG tablet Commonly known as: ZESTRIL Take 1 tablet (10 mg total) by mouth daily. What changed:  medication strength how much to take   METHADONE HCL PO Take 90 mg by mouth daily. Last take home on 04-19-22 for weekend.   multivitamin with minerals Tabs tablet Take 1 tablet by mouth every morning. Centrum   PARoxetine 40 MG tablet Commonly known as: PAXIL Take 40 mg by mouth daily.        Discharge Assessment: Vitals:   04/26/22 0633 04/26/22 0927  BP: (!) 163/60 (!) 156/74  Pulse: 60 64  Resp: 20 18  Temp: 98.4 F (36.9 C)   SpO2: 97% 93%   Skin clean, dry and intact without evidence of skin break down, no evidence  of skin tears noted. IV catheter discontinued intact. Site without signs and symptoms of complications - no redness or edema noted at insertion site, patient denies c/o pain - only slight tenderness at site.  Dressing with slight pressure applied.  D/c Instructions-Education: Discharge instructions given to patient/family with verbalized understanding. D/c education completed with patient/family including follow up instructions, medication list, d/c activities limitations if indicated, with other d/c instructions as indicated by MD - patient able to verbalize understanding, all questions fully answered. Patient instructed to return to ED, call 911, or call MD for any changes in condition.  Patient escorted via [place], and D/C home via private auto.  Atilano Ina, RN 04/26/2022 11:06 AM

## 2022-04-26 NOTE — Discharge Summary (Signed)
Physician Discharge Summary   Patient: Jonathan Wilson MRN: [id number] DOB: Jul 12, 1950  Admit date:     04/22/2022  Discharge date: 04/26/22  Discharge Physician: Oswald Hillock   PCP: Vicenta Aly, FNP   Recommendations at discharge:   Follow-up PCP as outpatient  Discharge Diagnoses: Principal Problem:   AKI (acute kidney injury) ([place]) Active Problems:   High anion gap metabolic acidosis   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[place])   Hepatitis C   Essential hypertension   COPD (chronic obstructive pulmonary disease) ([facility])   Chest pain   COPD exacerbation ([facility])   Chronic kidney disease, stage 3b ([place])   Headache  Resolved Problems:   * No resolved hospital problems. *  Hospital Course:  71 year old male with history of renal cell carcinoma, activity status posttreatment, hypertension, hyperlipidemia, opioid dependence on chronic methadone, COPD, tobacco abuse for 50 years presented with multiple complaints including chest pain, shortness of breath, nausea, poor p.o. intake, dehydration, headache.  In the ED lab work showed creatinine 2.7, creatinine was 1.4 on 08/01/2021.  Troponin 19-18, lactic acid 1.3-0.9.  CT head showed atrophy with chronic microvascular ischemic disease, no acute intracranial abnormality.  Chest x-ray was unremarkable.  CT abdomen/pelvis/chest showed no acute intrathoracic intra-abdominal or pelvic pathology.  3.5 cm infrarenal abdominal aortic aneurysm was noted which was similar to prior.   Assessment and Plan:   Acute kidney injury on CKD stage IIIb -Patient baseline creatinine 1.4 as of March 2023 -Presented with creatinine of 2.9 on 11/21, likely due to poor p.o. intake, HCTZ, lisinopril and NSAIDs use -CT abdomen/pelvis showed no hydronephrosis or obstruction -Continue to hold HCTZ -Patient was started on normal saline at 75 mill per hour -Creatinine has improved to 1.32   UA is clear -Urine culture showed no growth   High anion gap  metabolic acidosis -Resolved -Likely starvation ketosis due to poor p.o. intake -Anion gap was 18 on admission, no lactic acidosis   Mild hyperkalemia -Resolved after patient was given Lokelma 5 g p.o. x1   CAD exacerbation -Significantly improved -Solu-Medrol has been discontinued -- We will discharge on albuterol inhaler and Breo Ellipta   Methadone maintenance therapy treatment -Continue methadone for maintenance therapy   Hepatitis C -S/p treatment -Cirrhosis noted on ultrasound -Follow as outpatient   Hypertension -Blood pressure is stable -Continue to hold HCTZ -We will start amlodipine 5 mg daily and lisinopril 10 mg daily   Headache -No focal neurological deficits -CT head unremarkable -Resolved   Infrarenal abdominal aortic aneurysm -CT abdomen and pelvis showed 3.5 cm infrarenal AAA similar to prior CT - recommended follow-up ultrasound every 2 years.         Consultants: None Procedures performed: None Disposition: Home Diet recommendation:  Discharge Diet Orders (From admission, onward)     Start     Ordered   04/26/22 0000  Diet - low sodium heart healthy        04/26/22 1046           Regular diet DISCHARGE MEDICATION: Allergies as of 04/26/2022   No Known Allergies      Medication List     STOP taking these medications    ibuprofen 200 MG tablet Commonly known as: ADVIL   lisinopril-hydrochlorothiazide 20-12.5 MG tablet Commonly known as: ZESTORETIC   Stiolto Respimat 2.5-2.5 MCG/ACT Aers Generic drug: Tiotropium Bromide-Olodaterol       TAKE these medications    albuterol 108 (90 Base) MCG/ACT inhaler Commonly known as: VENTOLIN HFA Inhale  2 puffs into the lungs every 6 (six) hours as needed for shortness of breath.   amLODipine 5 MG tablet Commonly known as: NORVASC Take 5 mg by mouth daily.   aspirin EC 81 MG tablet Take 81 mg by mouth every morning. Swallow whole.   doxycycline 100 MG tablet Commonly known  as: VIBRA-TABS Take 1 tablet (100 mg total) by mouth every 12 (twelve) hours.   fluticasone furoate-vilanterol 100-25 MCG/ACT Aepb Commonly known as: Breo Ellipta Inhale 1 puff into the lungs daily.   Linzess 145 MCG Caps capsule Generic drug: linaclotide Take 145 mcg by mouth daily as needed (constipation).   lisinopril 10 MG tablet Commonly known as: ZESTRIL Take 1 tablet (10 mg total) by mouth daily. What changed:  medication strength how much to take   METHADONE HCL PO Take 90 mg by mouth daily. Last take home on 04-19-22 for weekend.   multivitamin with minerals Tabs tablet Take 1 tablet by mouth every morning. Centrum   PARoxetine 40 MG tablet Commonly known as: PAXIL Take 40 mg by mouth daily.        Follow-up Information     Vicenta Aly, FNP Follow up.   Specialty: Nurse Practitioner Contact information: [place] [address] [phone]                Discharge Exam: Filed Weights   04/23/22 0810  Weight: 101 kg   General-appears in no acute distress Heart-S1-S2, regular, no murmur auscultated Lungs-clear to auscultation bilaterally, no wheezing or crackles auscultated Abdomen-soft, nontender, no organomegaly Extremities-no edema in the lower extremities Neuro-alert, oriented x3, no focal deficit noted  Condition at discharge: good  The results of significant diagnostics from this hospitalization (including imaging, microbiology, ancillary and laboratory) are listed below for reference.   Imaging Studies: ECHOCARDIOGRAM COMPLETE  Result Date: 04/23/2022    ECHOCARDIOGRAM REPORT   Patient Name:   Jonathan Wilson Date of Exam: 04/23/2022 Medical Rec #:  [id number]     Height:       74.0 in Accession #:    [id number]    Weight:       223.2 lb Date of Birth:  [birth year]     BSA:          2.277 m Patient Age:    42 years      BP:           110/77 mmHg Patient Gender: M             HR:           52 bpm. Exam  Location:  Inpatient Procedure: 2D Echo, Cardiac Doppler and Color Doppler Indications:    R07.9* Chest pain, unspecified  History:        Patient has prior history of Echocardiogram examinations, most                 recent 07/07/2020. Signs/Symptoms:Chest Pain; Risk                 Factors:Hypertension and Dyslipidemia.  Sonographer:    Bernadene Person RDCS Referring Phys: [id number] RIPUDEEP K RAI IMPRESSIONS  1. Left ventricular ejection fraction, by estimation, is 50 to 55%. The left ventricle has low normal function. The left ventricle has no regional wall motion abnormalities. Left ventricular diastolic parameters are consistent with Grade I diastolic dysfunction (impaired relaxation).  2. Right ventricular systolic function is normal. The right ventricular size is normal. There is normal pulmonary  artery systolic pressure.  3. The mitral valve is normal in structure. No evidence of mitral valve regurgitation. No evidence of mitral stenosis.  4. The aortic valve was not well visualized. Aortic valve regurgitation is not visualized.  5. Aortic dilatation noted. There is mild dilatation of the aortic root, measuring 40 mm.  6. The inferior vena cava is dilated in size with >50% respiratory variability, suggesting right atrial pressure of 8 mmHg. FINDINGS  Left Ventricle: Left ventricular ejection fraction, by estimation, is 50 to 55%. The left ventricle has low normal function. The left ventricle has no regional wall motion abnormalities. The left ventricular internal cavity size was normal in size. There is no left ventricular hypertrophy. Left ventricular diastolic parameters are consistent with Grade I diastolic dysfunction (impaired relaxation). Right Ventricle: The right ventricular size is normal. No increase in right ventricular wall thickness. Right ventricular systolic function is normal. There is normal pulmonary artery systolic pressure. The tricuspid regurgitant velocity is 1.17 m/s, and  with an assumed  right atrial pressure of 8 mmHg, the estimated right ventricular systolic pressure is 99.3 mmHg. Left Atrium: Left atrial size was normal in size. Right Atrium: Right atrial size was normal in size. Pericardium: There is no evidence of pericardial effusion. Mitral Valve: The mitral valve is normal in structure. No evidence of mitral valve regurgitation. No evidence of mitral valve stenosis. Tricuspid Valve: The tricuspid valve is not well visualized. Tricuspid valve regurgitation is not demonstrated. No evidence of tricuspid stenosis. Aortic Valve: The aortic valve was not well visualized. Aortic valve regurgitation is not visualized. Pulmonic Valve: The pulmonic valve was not well visualized. Pulmonic valve regurgitation is not visualized. No evidence of pulmonic stenosis. Aorta: The aortic root is normal in size and structure and aortic dilatation noted. There is mild dilatation of the aortic root, measuring 40 mm. Venous: The inferior vena cava is dilated in size with greater than 50% respiratory variability, suggesting right atrial pressure of 8 mmHg. IAS/Shunts: No atrial level shunt detected by color flow Doppler.  LEFT VENTRICLE PLAX 2D LVIDd:         5.40 cm      Diastology LVIDs:         3.20 cm      LV e' medial:    7.40 cm/s LV PW:         0.80 cm      LV E/e' medial:  9.4 LV IVS:        0.80 cm      LV e' lateral:   9.68 cm/s LVOT diam:     2.30 cm      LV E/e' lateral: 7.2 LV SV:         104 LV SV Index:   46 LVOT Area:     4.15 cm  LV Volumes (MOD) LV vol d, MOD A2C: 99.7 ml LV vol d, MOD A4C: 116.0 ml LV vol s, MOD A2C: 46.4 ml LV vol s, MOD A4C: 52.4 ml LV SV MOD A2C:     53.3 ml LV SV MOD A4C:     116.0 ml LV SV MOD BP:      61.4 ml RIGHT VENTRICLE RV S prime:     10.80 cm/s TAPSE (M-mode): 1.8 cm LEFT ATRIUM             Index        RIGHT ATRIUM           Index LA diam:  3.30 cm 1.45 cm/m   RA Area:     20.10 cm LA Vol (A2C):   43.8 ml 19.23 ml/m  RA Volume:   66.80 ml  29.33 ml/m LA Vol  (A4C):   44.1 ml 19.37 ml/m LA Biplane Vol: 43.6 ml 19.15 ml/m  AORTIC VALVE LVOT Vmax:   134.00 cm/s LVOT Vmean:  81.200 cm/s LVOT VTI:    0.250 m  AORTA Ao Root diam: 4.00 cm MITRAL VALVE               TRICUSPID VALVE MV Area (PHT): 3.27 cm    TR Peak grad:   5.5 mmHg MV Decel Time: 232 msec    TR Vmax:        117.00 cm/s MV E velocity: 69.40 cm/s MV A velocity: 69.00 cm/s  SHUNTS MV E/A ratio:  1.01        Systemic VTI:  0.25 m                            Systemic Diam: 2.30 cm Rudean Haskell MD Electronically signed by Rudean Haskell MD Signature Date/Time: 04/23/2022/9:40:06 AM    Final    CT CHEST ABDOMEN PELVIS WO CONTRAST  Result Date: 04/23/2022 CLINICAL DATA:  Sepsis. EXAM: CT CHEST, ABDOMEN AND PELVIS WITHOUT CONTRAST TECHNIQUE: Multidetector CT imaging of the chest, abdomen and pelvis was performed following the standard protocol without IV contrast. RADIATION DOSE REDUCTION: This exam was performed according to the departmental dose-optimization program which includes automated exposure control, adjustment of the mA and/or kV according to patient size and/or use of iterative reconstruction technique. COMPARISON:  Chest CT dated 10/25/2021 and CT abdomen pelvis dated 07/30/2021. FINDINGS: Evaluation of this exam is limited in the absence of intravenous contrast. CT CHEST FINDINGS Cardiovascular: There is no cardiomegaly or pericardial effusion. Three-vessel coronary vascular calcification. Mild atherosclerotic calcification of the thoracic aorta. No aneurysmal dilatation. The central pulmonary arteries are grossly unremarkable. Mediastinum/Nodes: No hilar or mediastinal adenopathy. The esophagus and thyroid gland are grossly unremarkable. No mediastinal fluid collection. Lungs/Pleura: The lungs are clear. There is no pleural effusion or pneumothorax. The central airways are patent. Musculoskeletal: Degenerative changes of the spine. No acute osseous pathology. CT ABDOMEN PELVIS  FINDINGS No intra-abdominal free air or free fluid. Hepatobiliary: The liver is unremarkable. No biliary dilatation. Cholecystectomy. Pancreas: The pancreas is unremarkable as visualized. Spleen: Normal in size without focal abnormality. Adrenals/Urinary Tract: The adrenal glands are unremarkable. Postsurgical changes of the upper pole of the right kidney as seen on the prior CT. There is no hydronephrosis or nephrolithiasis on either side. The visualized ureters and urinary bladder appear unremarkable. Stomach/Bowel: There is a 2.5 cm duodenal diverticulum. There is no bowel obstruction or active inflammation. The appendix is normal. Vascular/Lymphatic: Advanced aortoiliac atherosclerotic disease. There is a 3.5 cm infrarenal abdominal aortic aneurysm which is poorly evaluated in the absence of contrast but similar to prior CT. The IVC is unremarkable. No portal venous gas. There is no adenopathy. Reproductive: The prostate and seminal vesicles are grossly unremarkable. No pelvic mass. Other: None Musculoskeletal: Osteopenia with degenerative changes of the spine. No acute osseous pathology. IMPRESSION: 1. No acute intrathoracic, abdominal, or pelvic pathology. 2. A 3.5 cm infrarenal abdominal aortic aneurysm similar to prior CT. Recommend follow-up ultrasound every 2 years. This recommendation follows ACR consensus guidelines: White Paper of the ACR Incidental Findings Committee II on Vascular Findings. J Am Coll Radiol 2013; 10:789-794. 3.  Aortic Atherosclerosis (ICD10-I70.0). Electronically Signed   By: Anner Crete M.D.   On: 04/23/2022 01:45   CT HEAD WO CONTRAST (5MM)  Result Date: 04/23/2022 CLINICAL DATA:  Headache, new onset (Age >= 55y) EXAM: CT HEAD WITHOUT CONTRAST TECHNIQUE: Contiguous axial images were obtained from the base of the skull through the vertex without intravenous contrast. RADIATION DOSE REDUCTION: This exam was performed according to the departmental dose-optimization program  which includes automated exposure control, adjustment of the mA and/or kV according to patient size and/or use of iterative reconstruction technique. COMPARISON:  09/18/2015 FINDINGS: Brain: There is atrophy and chronic small vessel disease changes. No acute intracranial abnormality. Specifically, no hemorrhage, hydrocephalus, mass lesion, acute infarction, or significant intracranial injury. Vascular: No hyperdense vessel or unexpected calcification. Skull: No acute calvarial abnormality. Sinuses/Orbits: No acute findings Other: None IMPRESSION: Atrophy, chronic microvascular disease. No acute intracranial abnormality. Electronically Signed   By: Rolm Baptise M.D.   On: 04/23/2022 01:38   DG Chest 2 View  Result Date: 04/22/2022 CLINICAL DATA:  Two day history of shortness of breath and cough EXAM: CHEST - 2 VIEW COMPARISON:  Chest radiograph dated 07/30/2021, CT chest dated 10/25/2021 FINDINGS: Mildly hyperinflated lungs. No focal consolidations. No pleural effusion or pneumothorax. The heart size and mediastinal contours are within normal limits. The visualized skeletal structures are unremarkable. Cholecystectomy clips. IMPRESSION: No active cardiopulmonary disease. Electronically Signed   By: Darrin Nipper M.D.   On: 04/22/2022 21:24    Microbiology: Results for orders placed or performed during the hospital encounter of 04/22/22  Resp Panel by RT-PCR (Flu A&B, Covid) Anterior Nasal Swab     Status: None   Collection Time: 04/22/22  8:47 PM   Specimen: Anterior Nasal Swab  Result Value Ref Range Status   SARS Coronavirus 2 by RT PCR NEGATIVE NEGATIVE Final    Comment: (NOTE) SARS-CoV-2 target nucleic acids are NOT DETECTED.  The SARS-CoV-2 RNA is generally detectable in upper respiratory specimens during the acute phase of infection. The lowest concentration of SARS-CoV-2 viral copies this assay can detect is 138 copies/mL. A negative result does not preclude SARS-Cov-2 infection and should not  be used as the sole basis for treatment or other patient management decisions. A negative result may occur with  improper specimen collection/handling, submission of specimen other than nasopharyngeal swab, presence of viral mutation(s) within the areas targeted by this assay, and inadequate number of viral copies(<138 copies/mL). A negative result must be combined with clinical observations, patient history, and epidemiological information. The expected result is Negative.  Fact Sheet for Patients:  EntrepreneurPulse.com.au  Fact Sheet for Healthcare Providers:  IncredibleEmployment.be  This test is no t yet approved or cleared by the [place] FDA and  has been authorized for detection and/or diagnosis of SARS-CoV-2 by FDA under an Emergency Use Authorization (EUA). This EUA will remain  in effect (meaning this test can be used) for the duration of the COVID-19 declaration under Section 564(b)(1) of the Act, 21 U.S.C.section 360bbb-3(b)(1), unless the authorization is terminated  or revoked sooner.       Influenza A by PCR NEGATIVE NEGATIVE Final   Influenza B by PCR NEGATIVE NEGATIVE Final    Comment: (NOTE) The Xpert Xpress SARS-CoV-2/FLU/RSV plus assay is intended as an aid in the diagnosis of influenza from Nasopharyngeal swab specimens and should not be used as a sole basis for treatment. Nasal washings and aspirates are unacceptable for Xpert Xpress SARS-CoV-2/FLU/RSV testing.  Fact Sheet for Patients: EntrepreneurPulse.com.au  Fact Sheet for Healthcare Providers: IncredibleEmployment.be  This test is not yet approved or cleared by the [place] FDA and has been authorized for detection and/or diagnosis of SARS-CoV-2 by FDA under an Emergency Use Authorization (EUA). This EUA will remain in effect (meaning this test can be used) for the duration of the COVID-19 declaration under Section  564(b)(1) of the Act, 21 U.S.C. section 360bbb-3(b)(1), unless the authorization is terminated or revoked.  Performed at [facility], [place] [address] [place], [place] [address]   Blood culture (routine x 2)     Status: None (Preliminary result)   Collection Time: 04/23/22 12:35 AM   Specimen: BLOOD RIGHT FOREARM  Result Value Ref Range Status   Specimen Description BLOOD RIGHT FOREARM  Final   Special Requests   Final    BOTTLES DRAWN AEROBIC AND ANAEROBIC Blood Culture results may not be optimal due to an excessive volume of blood received in culture bottles   Culture   Final    NO GROWTH 3 DAYS Performed at [facility], [place] [address], [place], [place] [address]    Report Status PENDING  Incomplete  Blood culture (routine x 2)     Status: None (Preliminary result)   Collection Time: 04/23/22 12:45 AM   Specimen: BLOOD LEFT FOREARM  Result Value Ref Range Status   Specimen Description BLOOD LEFT FOREARM  Final   Special Requests   Final    BOTTLES DRAWN AEROBIC AND ANAEROBIC Blood Culture results may not be optimal due to an excessive volume of blood received in culture bottles   Culture   Final    NO GROWTH 3 DAYS Performed at [facility], [place] [address] [place], [place] [address]    Report Status PENDING  Incomplete  Urine Culture     Status: None   Collection Time: 04/23/22  7:20 AM   Specimen: Urine, Clean Catch  Result Value Ref Range Status   Specimen Description URINE, CLEAN CATCH  Final   Special Requests NONE  Final   Culture   Final    NO GROWTH Performed at [facility], [place] [address] [place], [place] [address]    Report Status 04/24/2022 FINAL  Final    Labs: CBC: Recent Labs  Lab 04/22/22 2047 04/23/22 0045 04/24/22 0358  WBC 8.9 8.2 8.2  NEUTROABS 6.6  --   --   HGB 15.3 14.9 12.0*  HCT 45.4 45.0 36.1*  MCV 93.6 95.7 95.8  PLT 229 251 229   Basic Metabolic Panel: Recent Labs  Lab 04/22/22 2047 04/23/22 0045  04/24/22 0358 04/25/22 0144 04/26/22 0220  NA 141 143 138 136 139  K 4.1 4.6 5.2* 5.2* 4.7  CL 100 98 100 97* 98  CO2 '23 28 29 28 29  '$ GLUCOSE 93 114* 115* 103* 89  BUN 28* 32* 31* 29* 28*  CREATININE 2.73* 2.90* 1.66* 1.58* 1.32*  CALCIUM 10.0 10.3 9.3 9.6 9.6   Liver Function Tests: Recent Labs  Lab 04/22/22 2047  AST 22  ALT 12  ALKPHOS 79  BILITOT 0.8  PROT 7.5  ALBUMIN 4.6   CBG: No results for input(s): "GLUCAP" in the last 168 hours.  Discharge time spent: greater than 30 minutes.  Signed: Oswald Hillock, MD [facility] 04/26/2022

## 2022-04-26 NOTE — Discharge Instructions (Signed)
If you are in need of a shelter please call [facility] ([place]) at [phone] between the hours of 9am-5pm Mon-Fri.  [facility] will contact all the local shelters to find openings.  Right now they are requiring people to quarantine at a hotel before they can go to an open bed but [facility] may be able to set that up as well.  They are not open on weekends.  On Monday-Friday morning at 8 am until 1 pm, you can also go to the Time Warner (see below) to seek shelter in the [facility] [place] prior to entering a shelter. You can also call the number provided (see the above paragraph) to seek placement into the program by calling [facility].   Interactive resource center [facility]) [place], [place] [address] Phone: [phone] Fax: [phone]  For Free Breakfasts and Lunches 7 days a week you can go to: [facility] [address] [place], [place], [place] [address] Hours: Open today  8AM-5PM Phone: [phone] Breakfast: 6:30-7:30 am Lunch served: 10:40 am - 12:40pm         [facility]) M-F 8am-3pm   [address], [place] [address]   [phone] Services include: laundry, barbering, support groups, case management, phone  & computer access, showers, AA/NA mtgs, mental health/substance abuse nurse, job skills class, disability information, VA assistance, spiritual classes, etc.   [facility], [place] Provides breakfast each weekday morning except Wednesdays, and an evening community meal every Friday. Access to showers is available during breakfast hours and telephones for seeking work are also provided. Also offers job referral and counseling for the homeless and unemployed.  HOMELESS SHELTERS [facility]   [place], [place] [address]     [address], [place]  [phone]      [phone]  [facility] [place] [address]    [address] [place] [address]     [place], [place] [address] [phone]       [phone]  [facility] (women only) [address] [place], [place] [address] [place]: Rondall Allegra (overflow shelter: [place], [place] check in at 6pm for placement in local shelter).  [place], [place], [place] [address] Phone: [place]     [place]      [facility] [phone]. S[place], [place] [address]     [place], [place] [address]    [facility] Mobile Crisis Management - [phone]   8:00am served in [facility] by [place] 8:30am SHUTTLE provided from [facility], served at [employer]erated [employer] Stores, [address] LUNCH TWO LOCATIONS [plus one additional third Sunday only] 10:30am - 12:30pm served at CMS Energy Corporation, Pacific Mutual, Standard [place] (1.2 miles from [facility]) 12:30pm served in [place]. [place] by BJ's Team (Montz Sunday only) 1:30pm served at [facility] by [facility] one location [plus one additional third Sunday only] 5:00pm Every Sunday, served  under the bridge at [place]. by Bollinger (.7 miles from [facility]) (THIRD Sunday ONLY) 4:00pm served in the parking garage, across from Sempra Energy, corner of [place] and [place] by H. J. Heinz Works Ministries MONDAYS BREAKFAST 7:30am served in Sempra Energy by the Health Net and Friends LUNCH 10:30am - 12:30pm served at CMS Energy Corporation, Pacific Mutual, Thurston [place] (1.2 miles from  [facility]) [place]: 7:00pm served in front of the courthouse at the corner of Pitney Bowes and Tech Data Corporation. by [facility] Monday Night Meal (3 blocks from [facility]) 4:30pm served at the Time Warner, [place] by Peter Kiewit Sons Not Bombs (0.6 miles from [facility]) [place] served at PepsiCo, [place] (0.3 miles from [place]) LUNCH 10:30am - 12:30pm served at the CMS Energy Corporation, Byron [address] (1.2 miles from [place]) Pope 6:00pm served at Dole Food, enter from Brink's Company and go to the Omnicom, (0.7 miles from [place] Con[place]) Yale 7:00am - 8:00am served at CMS Energy Corporation, [place] [address], (1.2 miles from [place]) [place] [plus two additional locations listed below] 10:30am - 12:30pm served at CMS Energy Corporation, Corning [address] (1.2 miles from [place]) (FIRST Wednesday ONLY) 11:30am served at Kelly Services, [place]uncie (6.6 miles from [place]) (Cushing Wednesday ONLY) 11:00am served at Co[place], [place]e (1.3 miles from Kay[place]) [place] 6:00pm served at Amgen Inc, [place] Wachovia Corporation. (1.3 miles from [facility]) 4:00pm - 6:00pm (hot dogs and chips) served at NCR Corporation of [facility], [place] (1.7 miles from Wagner) Falcon [place] 10:30am - 12:30pm served at CMS Energy Corporation, Pacific Mutual, [place] [address], (1.2 miles from [place]) [place] 6:00pm served at Dole Food, enter from Brink's Company and go to the Omnicom, (0.7 miles from Eaton Corporation) [place]well 10:30am - 12:30pm served at CMS Energy Corporation, [place] [address] (1.2 miles from [place]) Lakeside, [plus one additional first Friday only] 6:00pm served under the bridge at Brigantine. by Black Hammock. (.7 miles from [facility]) 5:00pm - 7:00pm served at NCR Corporation of [facility], [place]ft[place] (1.7 miles from Monroe) (FIRST Friday ONLY) 5:45 pm - SHUTTLE provided from the [place] at 5:45pm. Served at [facility], Lamar [plus one additional last Saturday only] 8:00am served at [facility] by Smithfield Foods 8:30am served at Land O'Lakes, Calio KB Home	[place]. (2.2 miles from [facility]) (LAST Saturday ONLY) 8:30am served at Sanmina-SCI, Cash[place] (5 miles from [place]) LUNCH 10:30am - 12:30pm served at CMS Energy Corporation, Pacific Mutual [place] Sharolyn Douglas., (1.2 miles from [facility]) Malvern 6:00pm served under the bridge at [place]. by The Procter & Gamble (0.7 miles from Sempra Energy)  [place] at 300  Adelphi. (.7 miles from [place]) [place] on Delta Junction Right onto Northwest Airlines 433 ft. Continue onto [address] under bridge, about 500 ft. Courthouse (3 blocks from [facility]) [place] on El Paraiso right on [place] 1 block to Parker Hannifin (.5 miles from Strawn) Schulter on [place]. Temple-Inland. past WellPoint to Cablevision Systems. Enter from Brink's Company and go to the Office Depot building Amgen Inc [address] Wachovia Corporation. (1.3 miles from [facility]) [place] on Homestead Right onto W. Sharolyn Douglas. church will be on the Left. Walnut [address] (.3 miles from [facility]) Go .3 miles on W. Friendly Destination is on your right Kelly Services at American International Group (6.6 miles from [place]) Fargo on Guyton toward W Friendly Turn right onto W Friendly Continue onto Verizon. Continue onto Waterloo is on right [facility] Steel Holding Corporation) [place] [address] (.6 miles from Mat[place]) Franklin on [place]. [address] Turn Left onto E. Crimora. 0.3 miles Destination is on the Left. Terrace Heights at R.R. Donnelley (5 miles from Sempra Energy) 1. Head south on Marine City right onto W Friendly Turn slightly left onto Colgate-Palmolive Continue onto Colgate-Palmolive Turn right at Meadow to church on right New Birth Sounds of [facility] [address] El[address]/Eugene (1.7 miles from [place]) Clay on Elida 1.4 miles Bickleton becomes [place]. Elm [address] Continue 0.6 miles and church will be on theright. [place] at [address] (2.5 miles from Sempra Energy) [place] provided from Saluda on [place]. Elm toward Lowe's Companies right onto El Paso Corporation left onto Office Depot left onto Hanover Baxter International (2.2 miles from [place] Heights) [place] Ridge on Point Hope 1.4 miles [place]compte becomes [place]. Juliaetta Turn right onto Plum [place]. and church will be on the Left. Potter's House/Pilgrim ArvinMeritor St. Hilaire Osceola (1.2 miles from [facility]) 1.Turn right onto [facility] 2.Turn left onto Carlos Levering 3.Maryclare Labrador 4.Destination is on your right Imperial at Home Depot (1.3 miles from [facility]) South [place] on Versailles left onto Emerson Electric right onto S. Fords onto Fiserv. Turn left onto [place] right onto  [place] assistance programs. If you are behind on your bills and expenses, and need some help to make it through a short term hardship or financial emergency, there are several organizations and charities in the [place] and [place] area that may be able to help. They range from the Boeing, Pacific Mutual, Air cabin crew of [place]set. These groups may be able to provide you resources to help pay your utility bills, rent, and they even offer housing assistance.  Crisis assistance program Find help for paying your rent, electric bills, free food, and even funds to pay your mortgage. The Pacific Mutual [phone])045-4730) offers several services to local families, as funding allows. The Emergency Assistance Program (EAP), which they administer, provides household  goods, free food, clothing, and financial aid to people in need in the [facility] area. The EAP program does have some qualification, and counselors will interview clients for financial assistance by written referral only. Referrals need to be made by the Department of Social Services or by other EAP approved human services agencies or charities in the area.  Money for resources for emergency assistance are available for security deposits for rent, water, electric, and gas, past due rent, utility bills, past due mortgage payments, food, and clothing. The Pacific Mutual also operates a Building surveyor on the site. More Pacific Mutual.  [facility] of Fortune Brands, which can be reached at [phone], offers emergency assistance programs for those in need of help, such as food, rent assistance, a soup kitchen, shelter, and clothing. They are based in [facility] but provide a number of services to those that qualify for assistance. Continue with [facility]  programs.  [place] may be able to offer temporary financial assistance and cash grants for paying rent and utilities. Help may be provided for local county residents who may be experiencing personal crisis when other resources, including government programs, are not available. Call [phone]            St. L[place]lean Nanny Society, which is based in [place], provides financial assistance of up to $50.00 to help pay for rent, utilities, cooling bills, rent, and prescription medications. The program also provides secondhand furniture to those in need. [phone]  Countrywide Financial is a Geographical information systems officer. The organization can offer emergency assistance for paying rent, electric bills, utilities, food, household products and furniture. They offer extensive emergency and transitional housing for families, children and single women, and also run a 21 and Brunswick Corporation. [place], Capital One, and other aid offered too. [address], [place], [place], [phone]  Additional locations of the Boeing are in [place] and other nearby communities. When you have an emergency, need free food, money for basic needs, or just need assistance around Christmas, then the Boeing may have the resources you need. Or they can refer you to nearby agencies. Learn more.  [place] - This is offered for [facility] families. The federal government created Berkshire Hathaway Program provides a one-time cash grant payment to help eligible low-income families pay their electric and heating bills. [address], [place], [place] [address], [phone]  Government and [place] administers several emergency and self-sufficiency programs. Residents of [place] can get help with energy bills and food, rent, and other  expenses. In addition, work with a Tourist information centre manager who may be able to help you find a job or improve your employment skills. More [place] public assistance.  [place] Emergency Assistance - A program offers emergency utility and rent funds for greater Fortune Brands area residents. The program can also provide counseling and referrals to charities and government programs. Also provides food and a free meal program that serves lunch Mondays - Saturdays and dinner seven days per week to individuals in the community. [address] [place], [place]. [place], [phone]  [place] affordable apartment and housing communities across [place] and [place]. The low income and seniors can access public housing, rental assistance to qualified applicants, and apply  for the section 8 rent subsidy program. Other programs include Database administrator. [address] [place], [place], dial [phone].  Basic needs such as clothing - Low income families can receive free items (school supplies, clothes, holiday assistance, etc.) from clothing closets while more moderate income [place] families can shop at YRC Worldwide. Locations across the area help the needy. Get information on [place] free clothing centers.  The [facility] provides transitional housing to veterans and the disabled. Clients will also access other services too, including life skills classes, case management, and assistance in finding permanent housing. [address] [place], [place], call [phone]  [place] in [place] is for people who were just evicted or that are formerly homeless. The non-profit will also help then gain self-sufficiency, find a home or apartment to live in, and also provides information on rent assistance when needed.  Dial [phone]  [employer] Partnership to End Homelessness is available in [place]. Families that were evicted or that are homeless can gain shelter, food, clothing, furniture, and also emergency financial assistance. Other services include financial skills and life skills coaching, job training, and case management. [address] [place], [place] [address]. Telephone [phone].  The [place]. This can help people save money on their heating and summer cooling bills, and is free to low income families. Free upgrades can be made to your home. Phone [phone]  Many of the non-profits and programs mentioned above are all inclusive, meaning they can meet many needs of the low income, such as energy bills, food, rent, and more. However there are several organizations that focus just on rent and housing. Read more on rent assistance in [place] region.  Legal assistance for evictions, foreclosure, and more If you need free legal advice on civili issues, such as foreclosures, evictions, Mudlogger, government programs, domestic issues and more, Scientist, research (physical sciences) Aid of [facility] [facility]) is a Teacher, adult education firm that provides free legal services and counsel to lower income people, seniors, disabled, and others. The goal is to ensure everyone has access to justice and fair representation.  Call them at [phone], or click here to learn more about [place] free legal assistance programs.  [place] grants and funds for emergency expenses The Boeing is another organization that can provide people with Fluor Corporation and funds to pay bills. Their assistance depends on funding, and the demand for help is always very high. They can provide cash to help pay rent, a missed mortgage payment, or gas, electric, and water bills. But the assistance doesn't stop there. They also have a food pantry on site, which can  provide food once every three (3) months to people who need help. The Nash-Finch Company can also offer a Ambulance person once every three (3) months for a maximum three (3) times. After receiving this voucher over that period of time, applicants can receive this aid one every six (6) months after that. [phone].  [place]. offers job and Database administrator. Resources are focused on helping students obtain the skills and experiences that are necessary to compete in today's challenging and tight job market. The non-profit faith-based community action agency offers internship trainings as well as classroom instruction. Economically disadvantaged and challenged individuals and potential employers can use their services. Classes are tailored to  meet the needs of people in the [facility] region. [place], [place] [address], [phone]               Foreclosure prevention services Housing Counseling and Education is also offered by Harrah's Entertainment of the [place]. The agency (phone number is below) is a Radiation protection practitioner providing foreclosure advice and counseling. They offer mortgage resolution counseling and also reverse mortgage counseling. Counselors can direct people to both Kohl's, as well as Federal-Mogul foreclosure assistance options.  Tax adviser has locations in [place] and Fortune Brands. They run debt and foreclosure prevention programs for local families. A sampling of the programs offered include both Budget and Housing Counseling. This includes money management, financial advice, budget review and development of a written action plan with a Higher education careers adviser to help solve specific individual financial problems. In addition, housing and mortgage counselors can also provide pre-  and post-purchase homeownership counseling, default resolution counseling (to prevent foreclosure) and reverse mortgage counseling. A Debt Management Program allows people and families with a high level of credit card or medical debt to consolidate and repay consumer debt and loans to creditors and rebuild positive credit ratings and scores. [phone]  Debt assistance programs Receive free counseling and debt help from [facility] of the [place]. The [facility] based agency can be reached at [phone]. The counselors provide free help, and the services include budget counseling. This will help people manage their expenses and set goals. They also offer a Materials engineer, which will help individuals consolidate their debts and become debt free. Most of the workshops and services are free.  Community clinics in [place] of the leading health and dental centers are listed below. They may be able to provide medication, physicals, dental care, and general family care to residents of all incomes and backgrounds across the region. Some of the programs focus on the low income and underinsured. However if these clinics can't meet your needs, find information and details on more clinics in [facility].  Some of the options include US Airways of Fortune Brands. This center provides free or low cost health care to low-income adults 18 - 64, who have no health insurance. Among other services offered include a pharmacy and eye clinic. Phone [phone]  [facility], which is located in [place], is a community clinic that provides primary medical and health care to uninsured and underinsured adults and families, as well as the low income, in the greater [place] area on a sliding-fee scale. Call [phone]  [facility] [facility] - They run a dental assistance program that is organized by Williams. to provide  dental services and aid to Texas Instruments. Services offered by the dental clinic are limited to extractions, pain management, and minor restorative care. [phone]  [place] has locations in [place]. The community clinics provide complete pediatric care including primary health, mental health, social work, neurology, cardiology, asthma. Dial [phone].  In addition to those [place] and Fisher Scientific, find other free community clinics in [place] and across the county.  Food pantry and assistance Some of the local food pantries and distribution centers to call for free food and groceries include The Hive of [place] (phone [phone]), The [facility] (phone [phone]) and also Hughes Supply. Dial [phone].  Several other food banks in the region provide clothing, free food and meals, access to soup kitchens and other help.  Partnership [place] Open ? Closes 8PM  [phone]  [facility] [address] Open ? Closes 4:30PM  [phone]  The [place] [address] Open ? Closes 3PM  [phone]  Hannah [place] Open ? Closes 5PM  [phone]  [place] Open ? Closes 5PM  [phone]  [facility]  [address] Open ? Closes 5PM  [phone]

## 2022-04-26 NOTE — Progress Notes (Signed)
Mobility Specialist Progress Note:   04/26/22 1150  Mobility  Activity Ambulated independently in hallway  Level of Assistance Independent  Assistive Device None  Distance Ambulated (ft) 500 ft  Activity Response Tolerated well  Mobility Referral Yes  $Mobility charge 1 Mobility   Pt received ambulating in room and agreeable. No complaints. Pt left ambulating in room with all needs met and call bell in reach.   Andrey Campanile Mobility Specialist Please contact via SecureChat or  Rehab office at [phone]

## 2022-04-27 ENCOUNTER — Emergency Department (HOSPITAL_COMMUNITY): Payer: Medicare Other

## 2022-04-27 ENCOUNTER — Emergency Department (HOSPITAL_COMMUNITY)
Admission: EM | Admit: 2022-04-27 | Discharge: 2022-04-28 | Disposition: A | Discharge status: Home / Self Care | Payer: Medicare Other | Attending: Emergency Medicine | Admitting: Emergency Medicine

## 2022-04-27 DIAGNOSIS — M545 Low back pain, unspecified: Secondary | ICD-10-CM | POA: Insufficient documentation

## 2022-04-27 DIAGNOSIS — Z7982 Long term (current) use of aspirin: Secondary | ICD-10-CM | POA: Insufficient documentation

## 2022-04-27 DIAGNOSIS — R930 Abnormal findings on diagnostic imaging of skull and head, not elsewhere classified: Secondary | ICD-10-CM | POA: Insufficient documentation

## 2022-04-27 DIAGNOSIS — W19XXXA Unspecified fall, initial encounter: Secondary | ICD-10-CM

## 2022-04-27 DIAGNOSIS — W01198A Fall on same level from slipping, tripping and stumbling with subsequent striking against other object, initial encounter: Secondary | ICD-10-CM | POA: Diagnosis not present

## 2022-04-27 MED ORDER — LORAZEPAM 2 MG/ML IJ SOLN
INTRAMUSCULAR | Status: AC
  Filled 2022-04-27: qty 1

## 2022-04-27 NOTE — ED Triage Notes (Signed)
Patient here with complaint of slipping on a wet surface and falling onto his back at approximately 1400 today. Patient complains of sharp lower back pain, denies LOC.

## 2022-04-27 NOTE — ED Provider Triage Note (Cosign Needed Addendum)
Emergency Medicine Provider Triage Evaluation Note  Jonathan Wilson , a 71 y.o. male  was evaluated in triage.  Pt complains of fall around 2pm. Slipped on water and fell backwards onto his back. Unsure if he hit his head or lost consciousness. Not on blood thinners.  Review of Systems  Positive: R shoulder pain, lower back pain, neck pain Negative: Numbness, weakness, gait changes  Physical Exam  BP (!) 184/89 (BP Location: Right Arm)   Pulse 78   Temp 99.2 F (37.3 C) (Oral)   Resp 18   SpO2 94%  Gen:   Awake, no distress   Resp:  Normal effort  MSK:   Moves extremities without difficulty  Other:  Normal gait  Medical Decision Making  Medically screening exam initiated at 6:01 PM.  Appropriate orders placed.  Jonathan Wilson was informed that the remainder of the evaluation will be completed by another provider, this initial triage assessment does not replace that evaluation, and the importance of remaining in the ED until their evaluation is complete.  Workup initiated     Jonathan Plummer, PA-C 04/27/22 1802

## 2022-04-28 DIAGNOSIS — M545 Low back pain, unspecified: Secondary | ICD-10-CM | POA: Diagnosis not present

## 2022-04-28 LAB — CULTURE, BLOOD (ROUTINE X 2)
Culture: NO GROWTH
Culture: NO GROWTH

## 2022-04-28 MED ORDER — METHOCARBAMOL 500 MG PO TABS: 500.0000 mg | ORAL_TABLET | Freq: Three times a day (TID) | ORAL | 0 refills | Status: DC | PRN

## 2022-04-28 MED ORDER — LIDOCAINE 5 % EX PTCH
1.0000 | MEDICATED_PATCH | CUTANEOUS | Status: DC
  Administered 2022-04-28: 1 via TRANSDERMAL
  Filled 2022-04-28: qty 1

## 2022-04-28 MED ORDER — IBUPROFEN 800 MG PO TABS
800.0000 mg | ORAL_TABLET | Freq: Once | ORAL | Status: AC
  Administered 2022-04-28: 800 mg via ORAL
  Filled 2022-04-28: qty 1

## 2022-04-28 NOTE — ED Notes (Signed)
Patient ambulatory to exit.

## 2022-04-28 NOTE — ED Provider Notes (Signed)
[facility] EMERGENCY DEPARTMENT Provider Note   CSN: [id number] Arrival date & time: 04/27/22  1724     History  Chief Complaint  Patient presents with   Jonathan Wilson is a 71 y.o. male.  71 year old male who presents the ER today secondary to a fall.  Patient states that his feet got tangled up and he fell down landing on his back and hitting his head.  Patient states he has been here for 12 hours and now he hurts all over.  Patient states that did not syncopized, vomiting and altered mental status since that time.  He states he now is having little bit of sharp right lower back pain hurts worse when he twists a certain way but is fine when he stands still.  Not worse with walking necessarily unless he twists or moves a certain way.  No other bony pain.   Fall       Home Medications Prior to Admission medications   Medication Sig Start Date End Date Taking? Authorizing Provider  methocarbamol (ROBAXIN) 500 MG tablet Take 1 tablet (500 mg total) by mouth every 8 (eight) hours as needed for muscle spasms. 04/28/22  Yes Walaa Carel, Corene Cornea, MD  albuterol (VENTOLIN HFA) 108 (90 Base) MCG/ACT inhaler Inhale 2 puffs into the lungs every 6 (six) hours as needed for shortness of breath. 04/26/22   Oswald Hillock, MD  amLODipine (NORVASC) 5 MG tablet Take 5 mg by mouth daily.    [provider]  aspirin EC 81 MG tablet Take 81 mg by mouth every morning. Swallow whole.    [provider]  doxycycline (VIBRA-TABS) 100 MG tablet Take 1 tablet (100 mg total) by mouth every 12 (twelve) hours. 04/26/22   Oswald Hillock, MD  fluticasone furoate-vilanterol (BREO ELLIPTA) 100-25 MCG/ACT AEPB Inhale 1 puff into the lungs daily. 04/26/22   Oswald Hillock, MD  LINZESS 145 MCG CAPS capsule Take 145 mcg by mouth daily as needed (constipation). 12/10/21   [provider]  lisinopril (ZESTRIL) 10 MG tablet Take 1 tablet (10 mg total) by mouth daily. 04/26/22  07/25/22  Oswald Hillock, MD  METHADONE HCL PO Take 90 mg by mouth daily. Last take home on 04-19-22 for weekend.    [provider]  Multiple Vitamin (MULTIVITAMIN WITH MINERALS) TABS tablet Take 1 tablet by mouth every morning. Centrum    [provider]  PARoxetine (PAXIL) 40 MG tablet Take 40 mg by mouth daily.    [provider]      Allergies    Patient has no known allergies.    Review of Systems   Review of Systems  Physical Exam Updated Vital Signs BP (!) 147/84 (BP Location: Right Arm)   Pulse 60   Temp 97.7 F (36.5 C)   Resp 18   SpO2 91%  Physical Exam Vitals and nursing note reviewed.  Constitutional:      Appearance: He is well-developed.  HENT:     Head: Normocephalic and atraumatic.     Mouth/Throat:     Mouth: Mucous membranes are moist.     Pharynx: Oropharynx is clear.  Eyes:     Pupils: Pupils are equal, round, and reactive to light.  Cardiovascular:     Rate and Rhythm: Normal rate.  Pulmonary:     Effort: Pulmonary effort is normal. No respiratory distress.  Abdominal:     General: There is no distension.  Musculoskeletal:  General: Tenderness (right lumbar paraspinal) present. Normal range of motion.     Cervical back: Normal range of motion.  Skin:    General: Skin is warm and dry.  Neurological:     General: No focal deficit present.     Mental Status: He is alert.     ED Results / Procedures / Treatments   Labs (all labs ordered are listed, but only abnormal results are displayed) Labs Reviewed - No data to display  EKG None  Radiology DG Lumbar Spine Complete  Result Date: 04/27/2022 CLINICAL DATA:  Status post fall. EXAM: LUMBAR SPINE - COMPLETE 4+ VIEW COMPARISON:  None Available. FINDINGS: There is no evidence of lumbar spine fracture. Alignment is normal. Marked severity endplate sclerosis is seen at the level of L4-L5, with moderate severity endplate sclerosis noted throughout the remainder of  the lumbar spine. Bilateral marked severity facet joint hypertrophy is seen. Moderate to marked severity intervertebral disc space narrowing is seen at the levels of L4-L5 and L5-S1. IMPRESSION: Moderate to marked severity degenerative disc disease, most prominent at the levels of L4-L5 and L5-S1. Electronically Signed   By: Virgina Norfolk M.D.   On: 04/27/2022 19:14   DG Shoulder Right  Result Date: 04/27/2022 CLINICAL DATA:  Post fall. EXAM: RIGHT SHOULDER - 2+ VIEW COMPARISON:  None Available. FINDINGS: There is no evidence of fracture or dislocation. Mild degenerative changes are seen involving the right acromioclavicular joint and right glenohumeral articulation. Soft tissues are unremarkable. IMPRESSION: Mild degenerative changes without evidence of acute fracture or dislocation. Electronically Signed   By: Virgina Norfolk M.D.   On: 04/27/2022 19:04   CT Head Wo Contrast  Result Date: 04/27/2022 CLINICAL DATA:  Poly trauma fall EXAM: CT HEAD WITHOUT CONTRAST CT CERVICAL SPINE WITHOUT CONTRAST TECHNIQUE: Multidetector CT imaging of the head and cervical spine was performed following the standard protocol without intravenous contrast. Multiplanar CT image reconstructions of the cervical spine were also generated. RADIATION DOSE REDUCTION: This exam was performed according to the departmental dose-optimization program which includes automated exposure control, adjustment of the mA and/or kV according to patient size and/or use of iterative reconstruction technique. COMPARISON:  CT brain 04/23/2022 FINDINGS: CT HEAD FINDINGS Brain: No acute territorial infarction, hemorrhage or intracranial mass. Patchy white matter hypodensity likely chronic small vessel ischemic change. Mild atrophy. Nonenlarged ventricles. Vascular: No hyperdense vessels.  Carotid vascular calcification. Skull: Normal. Negative for fracture or focal lesion. Sinuses/Orbits: No acute finding. Other: None CT CERVICAL SPINE FINDINGS  Alignment: Straightening of the cervical spine. Images are acquired to the level of T1. Trace anterolisthesis C3 on C4. Trace retrolisthesis C5 on C6. Facet alignment is maintained. Skull base and vertebrae: No acute fracture. No primary bone lesion or focal pathologic process. Soft tissues and spinal canal: No prevertebral fluid or swelling. No visible canal hematoma. Disc levels: Multilevel degenerative change. Advanced disc space narrowing C4-C5, C5-C6 and C6-C7. Facet degenerative changes at multiple levels with foraminal stenosis. Upper chest: Negative. Other: None IMPRESSION: No CT evidence for acute intracranial abnormality. Atrophy and chronic small vessel ischemic changes of the white matter. Straightening of the cervical spine with degenerative changes. No acute osseous abnormality. Electronically Signed   By: Donavan Foil M.D.   On: 04/27/2022 19:01   CT Cervical Spine Wo Contrast  Result Date: 04/27/2022 CLINICAL DATA:  Poly trauma fall EXAM: CT HEAD WITHOUT CONTRAST CT CERVICAL SPINE WITHOUT CONTRAST TECHNIQUE: Multidetector CT imaging of the head and cervical spine was performed following the  standard protocol without intravenous contrast. Multiplanar CT image reconstructions of the cervical spine were also generated. RADIATION DOSE REDUCTION: This exam was performed according to the departmental dose-optimization program which includes automated exposure control, adjustment of the mA and/or kV according to patient size and/or use of iterative reconstruction technique. COMPARISON:  CT brain 04/23/2022 FINDINGS: CT HEAD FINDINGS Brain: No acute territorial infarction, hemorrhage or intracranial mass. Patchy white matter hypodensity likely chronic small vessel ischemic change. Mild atrophy. Nonenlarged ventricles. Vascular: No hyperdense vessels.  Carotid vascular calcification. Skull: Normal. Negative for fracture or focal lesion. Sinuses/Orbits: No acute finding. Other: None CT CERVICAL SPINE  FINDINGS Alignment: Straightening of the cervical spine. Images are acquired to the level of T1. Trace anterolisthesis C3 on C4. Trace retrolisthesis C5 on C6. Facet alignment is maintained. Skull base and vertebrae: No acute fracture. No primary bone lesion or focal pathologic process. Soft tissues and spinal canal: No prevertebral fluid or swelling. No visible canal hematoma. Disc levels: Multilevel degenerative change. Advanced disc space narrowing C4-C5, C5-C6 and C6-C7. Facet degenerative changes at multiple levels with foraminal stenosis. Upper chest: Negative. Other: None IMPRESSION: No CT evidence for acute intracranial abnormality. Atrophy and chronic small vessel ischemic changes of the white matter. Straightening of the cervical spine with degenerative changes. No acute osseous abnormality. Electronically Signed   By: Donavan Foil M.D.   On: 04/27/2022 19:01    Procedures Procedures    Medications Ordered in ED Medications  LORazepam (ATIVAN) 2 MG/ML injection (has no administration in time range)  lidocaine (LIDODERM) 5 % 1 patch (has no administration in time range)  ibuprofen (ADVIL) tablet 800 mg (has no administration in time range)    ED Course/ Medical Decision Making/ A&P                           Medical Decision Making Risk Prescription drug management.   X-rays and CTs independently viewed interpreted by myself without any obvious fractures.  She radiology does note some chronic findings however.  I think his lower back pain is muscular in nature as it is worse with movement and palpation but better at rest.  Do not see any need for imaging of that.  We will put a lidocaine patch on it and prescribe a muscle relaxers and anti-inflammatories for at home.  Is already on methadone.  PCP follow-up in 3 to 4 days if not having improvement.  No indication for further work-up at this time.  Fall was mechanical in nature no syncope or any other concerning factors to require  work-up or hospitalization.  Final Clinical Impression(s) / ED Diagnoses Final diagnoses:  Fall, initial encounter    Rx / DC Orders ED Discharge Orders          Ordered    methocarbamol (ROBAXIN) 500 MG tablet  Every 8 hours PRN        04/28/22 0521              Maejor Erven, Corene Cornea, MD 04/28/22 [phone]

## 2022-05-01 ENCOUNTER — Other Ambulatory Visit: Payer: Self-pay

## 2022-05-01 ENCOUNTER — Inpatient Hospital Stay (HOSPITAL_COMMUNITY)
Admission: EM | Admit: 2022-05-01 | Discharge: 2022-05-03 | DRG: 193 | Disposition: A | Discharge status: Home / Self Care | Payer: Medicare Other | Attending: Internal Medicine | Admitting: Internal Medicine

## 2022-05-01 ENCOUNTER — Encounter (HOSPITAL_COMMUNITY): Payer: Self-pay

## 2022-05-01 ENCOUNTER — Emergency Department (HOSPITAL_COMMUNITY): Payer: Medicare Other

## 2022-05-01 DIAGNOSIS — I10 Essential (primary) hypertension: Secondary | ICD-10-CM

## 2022-05-01 DIAGNOSIS — J440 Chronic obstructive pulmonary disease with acute lower respiratory infection: Secondary | ICD-10-CM | POA: Diagnosis present

## 2022-05-01 DIAGNOSIS — F1120 Opioid dependence, uncomplicated: Secondary | ICD-10-CM | POA: Diagnosis not present

## 2022-05-01 DIAGNOSIS — E785 Hyperlipidemia, unspecified: Secondary | ICD-10-CM | POA: Diagnosis present

## 2022-05-01 DIAGNOSIS — J9601 Acute respiratory failure with hypoxia: Secondary | ICD-10-CM

## 2022-05-01 DIAGNOSIS — Z9049 Acquired absence of other specified parts of digestive tract: Secondary | ICD-10-CM

## 2022-05-01 DIAGNOSIS — N179 Acute kidney failure, unspecified: Secondary | ICD-10-CM | POA: Diagnosis not present

## 2022-05-01 DIAGNOSIS — E669 Obesity, unspecified: Secondary | ICD-10-CM | POA: Diagnosis present

## 2022-05-01 DIAGNOSIS — Z905 Acquired absence of kidney: Secondary | ICD-10-CM

## 2022-05-01 DIAGNOSIS — R6889 Other general symptoms and signs: Secondary | ICD-10-CM

## 2022-05-01 DIAGNOSIS — Z6828 Body mass index (BMI) 28.0-28.9, adult: Secondary | ICD-10-CM

## 2022-05-01 DIAGNOSIS — K5900 Constipation, unspecified: Secondary | ICD-10-CM | POA: Diagnosis present

## 2022-05-01 DIAGNOSIS — J449 Chronic obstructive pulmonary disease, unspecified: Secondary | ICD-10-CM

## 2022-05-01 DIAGNOSIS — Z7982 Long term (current) use of aspirin: Secondary | ICD-10-CM

## 2022-05-01 DIAGNOSIS — R062 Wheezing: Secondary | ICD-10-CM | POA: Diagnosis not present

## 2022-05-01 DIAGNOSIS — J101 Influenza due to other identified influenza virus with other respiratory manifestations: Secondary | ICD-10-CM | POA: Diagnosis not present

## 2022-05-01 DIAGNOSIS — Z79899 Other long term (current) drug therapy: Secondary | ICD-10-CM

## 2022-05-01 DIAGNOSIS — F32A Depression, unspecified: Secondary | ICD-10-CM | POA: Diagnosis present

## 2022-05-01 DIAGNOSIS — F17210 Nicotine dependence, cigarettes, uncomplicated: Secondary | ICD-10-CM | POA: Diagnosis present

## 2022-05-01 DIAGNOSIS — J209 Acute bronchitis, unspecified: Secondary | ICD-10-CM | POA: Diagnosis present

## 2022-05-01 DIAGNOSIS — Z7951 Long term (current) use of inhaled steroids: Secondary | ICD-10-CM

## 2022-05-01 DIAGNOSIS — E872 Acidosis, unspecified: Secondary | ICD-10-CM | POA: Diagnosis present

## 2022-05-01 DIAGNOSIS — F419 Anxiety disorder, unspecified: Secondary | ICD-10-CM | POA: Diagnosis present

## 2022-05-01 DIAGNOSIS — N1832 Chronic kidney disease, stage 3b: Secondary | ICD-10-CM | POA: Diagnosis present

## 2022-05-01 DIAGNOSIS — R110 Nausea: Secondary | ICD-10-CM

## 2022-05-01 DIAGNOSIS — I129 Hypertensive chronic kidney disease with stage 1 through stage 4 chronic kidney disease, or unspecified chronic kidney disease: Secondary | ICD-10-CM | POA: Diagnosis present

## 2022-05-01 DIAGNOSIS — Z79891 Long term (current) use of opiate analgesic: Secondary | ICD-10-CM

## 2022-05-01 DIAGNOSIS — E860 Dehydration: Secondary | ICD-10-CM | POA: Diagnosis present

## 2022-05-01 DIAGNOSIS — Z1152 Encounter for screening for COVID-19: Secondary | ICD-10-CM

## 2022-05-01 DIAGNOSIS — Z85528 Personal history of other malignant neoplasm of kidney: Secondary | ICD-10-CM

## 2022-05-01 DIAGNOSIS — M109 Gout, unspecified: Secondary | ICD-10-CM | POA: Diagnosis present

## 2022-05-01 DIAGNOSIS — I714 Abdominal aortic aneurysm, without rupture, unspecified: Secondary | ICD-10-CM | POA: Diagnosis present

## 2022-05-01 LAB — LACTIC ACID, PLASMA
Lactic Acid, Venous: 3.2 mmol/L (ref 0.5–1.9)
Lactic Acid, Venous: 2.7 mmol/L (ref 0.5–1.9)
Lactic Acid, Venous: 0.8 mmol/L (ref 0.5–1.9)

## 2022-05-01 LAB — COMPREHENSIVE METABOLIC PANEL
ALT: 21 U/L (ref 0–44)
AST: 25 U/L (ref 15–41)
Albumin: 4 g/dL (ref 3.5–5.0)
Alkaline Phosphatase: 70 U/L (ref 38–126)
Anion gap: 9 (ref 5–15)
BUN: 25 mg/dL — ABNORMAL HIGH (ref 8–23)
CO2: 29 mmol/L (ref 22–32)
Calcium: 9.2 mg/dL (ref 8.9–10.3)
Chloride: 102 mmol/L (ref 98–111)
Creatinine, Ser: 1.52 mg/dL — ABNORMAL HIGH (ref 0.61–1.24)
GFR, Estimated: 49 mL/min — ABNORMAL LOW (ref >60)
Glucose, Bld: 88 mg/dL (ref 70–99)
Potassium: 4 mmol/L (ref 3.5–5.1)
Sodium: 140 mmol/L (ref 135–145)
Total Bilirubin: 0.5 mg/dL (ref 0.3–1.2)
Total Protein: 6.7 g/dL (ref 6.5–8.1)

## 2022-05-01 LAB — CBC WITH DIFFERENTIAL/PLATELET
Abs Immature Granulocytes: 0.01 10*3/uL (ref 0.00–0.07)
Basophils Absolute: 0 10*3/uL (ref 0.0–0.1)
Basophils Relative: 0 %
Eosinophils Absolute: 0.1 10*3/uL (ref 0.0–0.5)
Eosinophils Relative: 3 %
HCT: 38.8 % — ABNORMAL LOW (ref 39.0–52.0)
Hemoglobin: 12.7 g/dL — ABNORMAL LOW (ref 13.0–17.0)
Immature Granulocytes: 0 %
Lymphocytes Relative: 12 %
Lymphs Abs: 0.5 10*3/uL — ABNORMAL LOW (ref 0.7–4.0)
MCH: 32.1 pg (ref 26.0–34.0)
MCHC: 32.7 g/dL (ref 30.0–36.0)
MCV: 98 fL (ref 80.0–100.0)
Monocytes Absolute: 0.7 10*3/uL (ref 0.1–1.0)
Monocytes Relative: 16 %
Neutro Abs: 3.1 10*3/uL (ref 1.7–7.7)
Neutrophils Relative %: 69 %
Platelets: 175 10*3/uL (ref 150–400)
RBC: 3.96 MIL/uL — ABNORMAL LOW (ref 4.22–5.81)
RDW: 12.6 % (ref 11.5–15.5)
WBC: 4.5 10*3/uL (ref 4.0–10.5)
nRBC: 0 % (ref 0.0–0.2)

## 2022-05-01 LAB — RESP PANEL BY RT-PCR (FLU A&B, COVID) ARPGX2
Influenza A by PCR: POSITIVE — AB
Influenza B by PCR: NEGATIVE
SARS Coronavirus 2 by RT PCR: NEGATIVE

## 2022-05-01 LAB — TROPONIN I (HIGH SENSITIVITY)
Troponin I (High Sensitivity): 10 ng/L (ref <18)
Troponin I (High Sensitivity): 10 ng/L (ref <18)

## 2022-05-01 MED ORDER — IPRATROPIUM-ALBUTEROL 0.5-2.5 (3) MG/3ML IN SOLN
3.0000 mL | Freq: Once | RESPIRATORY_TRACT | Status: AC
  Administered 2022-05-01: 3 mL via RESPIRATORY_TRACT
  Filled 2022-05-01: qty 3

## 2022-05-01 MED ORDER — ENOXAPARIN SODIUM 40 MG/0.4ML IJ SOSY
40.0000 mg | PREFILLED_SYRINGE | INTRAMUSCULAR | Status: DC
  Administered 2022-05-02: 40 mg via SUBCUTANEOUS
  Filled 2022-05-01 (×2): qty 0.4

## 2022-05-01 MED ORDER — ACETAMINOPHEN 325 MG PO TABS
650.0000 mg | ORAL_TABLET | Freq: Once | ORAL | Status: AC
  Administered 2022-05-01: 650 mg via ORAL
  Filled 2022-05-01: qty 2

## 2022-05-01 MED ORDER — LACTATED RINGERS IV SOLN: INTRAVENOUS | Status: AC

## 2022-05-01 MED ORDER — OSELTAMIVIR PHOSPHATE 75 MG PO CAPS
75.0000 mg | ORAL_CAPSULE | Freq: Two times a day (BID) | ORAL | Status: DC
  Administered 2022-05-01: 75 mg via ORAL
  Filled 2022-05-01: qty 1

## 2022-05-01 MED ORDER — IPRATROPIUM-ALBUTEROL 0.5-2.5 (3) MG/3ML IN SOLN
3.0000 mL | Freq: Four times a day (QID) | RESPIRATORY_TRACT | Status: DC | PRN
  Administered 2022-05-02: 3 mL via RESPIRATORY_TRACT
  Filled 2022-05-01: qty 3

## 2022-05-01 MED ORDER — ONDANSETRON HCL 4 MG/2ML IJ SOLN
4.0000 mg | Freq: Once | INTRAMUSCULAR | Status: AC
  Administered 2022-05-01: 4 mg via INTRAVENOUS
  Filled 2022-05-01: qty 2

## 2022-05-01 MED ORDER — LACTATED RINGERS IV BOLUS
2000.0000 mL | Freq: Once | INTRAVENOUS | Status: AC
  Administered 2022-05-01: 2000 mL via INTRAVENOUS

## 2022-05-01 NOTE — Assessment & Plan Note (Signed)
Continue methadone pending verification with pharmacy

## 2022-05-01 NOTE — ED Notes (Signed)
While pt was sleeping O2 sat dropped to 82% on RA. Rn applied 2L of O2 via [place]. O2 sat increased to 93%. Cherlynn June, PA notified.

## 2022-05-01 NOTE — ED Provider Notes (Signed)
[facility] DEPT Provider Note   CSN: [id number] Arrival date & time: 05/01/22  1508     History  Chief Complaint  Patient presents with   Shortness of Breath    Jonathan Wilson is a 71 y.o. male.  Patient presents to the emergency department via EMS complaining of shortness of breath.  Patient states that he was admitted to [facility] earlier this month and was diagnosed with pneumonia and an acute kidney injury.  He states he was discharged and continued to feel short of breath.  He states that he feels that he is wheezing and he has a COPD flare.  EMS noted that his initial oxygen saturation was 85% on room air.  He had tried his albuterol inhaler at home with no relief.  He currently denies chest pain, headache, known fever, urinary symptoms, nausea, vomiting.  Past medical history significant for pneumonia, renal cancer, anxiety and depression, hepatitis C, chronic kidney disease stage IIIb, AAA, COPD  HPI     Home Medications Prior to Admission medications   Medication Sig Start Date End Date Taking? Authorizing Provider  albuterol (VENTOLIN HFA) 108 (90 Base) MCG/ACT inhaler Inhale 2 puffs into the lungs every 6 (six) hours as needed for shortness of breath. 04/26/22   Oswald Hillock, MD  amLODipine (NORVASC) 5 MG tablet Take 5 mg by mouth daily.    [provider]  aspirin EC 81 MG tablet Take 81 mg by mouth every morning. Swallow whole.    [provider]  doxycycline (VIBRA-TABS) 100 MG tablet Take 1 tablet (100 mg total) by mouth every 12 (twelve) hours. 04/26/22   Oswald Hillock, MD  fluticasone furoate-vilanterol (BREO ELLIPTA) 100-25 MCG/ACT AEPB Inhale 1 puff into the lungs daily. 04/26/22   Oswald Hillock, MD  LINZESS 145 MCG CAPS capsule Take 145 mcg by mouth daily as needed (constipation). 12/10/21   [provider]  lisinopril (ZESTRIL) 10 MG tablet Take 1 tablet (10 mg total) by mouth daily. 04/26/22  07/25/22  Oswald Hillock, MD  METHADONE HCL PO Take 90 mg by mouth daily. Last take home on 04-19-22 for weekend.    [provider]  methocarbamol (ROBAXIN) 500 MG tablet Take 1 tablet (500 mg total) by mouth every 8 (eight) hours as needed for muscle spasms. 04/28/22   Mesner, Corene Cornea, MD  Multiple Vitamin (MULTIVITAMIN WITH MINERALS) TABS tablet Take 1 tablet by mouth every morning. Centrum    [provider]  PARoxetine (PAXIL) 40 MG tablet Take 40 mg by mouth daily.    [provider]      Allergies    Patient has no known allergies.    Review of Systems   Review of Systems  Constitutional:  Positive for fever (Unknown).  Respiratory:  Positive for cough, shortness of breath and wheezing.   Cardiovascular:  Negative for chest pain.  Gastrointestinal:  Negative for abdominal pain, nausea and vomiting.  Genitourinary:  Negative for dysuria.  Musculoskeletal:  Negative for arthralgias and myalgias.  Neurological:  Negative for headaches.    Physical Exam Updated Vital Signs BP 123/70   Pulse 70   Temp 98.6 F (37 C) (Oral)   Resp 18   Ht '6\' 2"'$  (1.88 m)   Wt 100.7 kg   SpO2 94%   BMI 28.50 kg/m  Physical Exam Vitals and nursing note reviewed.  Constitutional:      General: He is not in acute distress.  Appearance: He is well-developed.  HENT:     Head: Normocephalic and atraumatic.  Eyes:     Extraocular Movements: Extraocular movements intact.     Conjunctiva/sclera: Conjunctivae normal.  Cardiovascular:     Rate and Rhythm: Normal rate and regular rhythm.     Heart sounds: No murmur heard. Pulmonary:     Effort: Pulmonary effort is normal. No respiratory distress.     Breath sounds: Wheezing present.  Chest:     Chest wall: No tenderness.  Abdominal:     Palpations: Abdomen is soft.     Tenderness: There is no abdominal tenderness.  Musculoskeletal:        General: No swelling.     Cervical back: Neck supple.     Right lower leg: No  edema.     Left lower leg: No edema.  Skin:    General: Skin is warm and dry.     Capillary Refill: Capillary refill takes less than 2 seconds.  Neurological:     Mental Status: He is alert.  Psychiatric:        Mood and Affect: Mood normal.     ED Results / Procedures / Treatments   Labs (all labs ordered are listed, but only abnormal results are displayed) Labs Reviewed  RESP PANEL BY RT-PCR (FLU A&B, COVID) ARPGX2 - Abnormal; Notable for the following components:      Result Value   Influenza A by PCR POSITIVE (*)    All other components within normal limits  CBC WITH DIFFERENTIAL/PLATELET - Abnormal; Notable for the following components:   RBC 3.96 (*)    Hemoglobin 12.7 (*)    HCT 38.8 (*)    Lymphs Abs 0.5 (*)    All other components within normal limits  COMPREHENSIVE METABOLIC PANEL - Abnormal; Notable for the following components:   BUN 25 (*)    Creatinine, Ser 1.52 (*)    GFR, Estimated 49 (*)    All other components within normal limits  LACTIC ACID, PLASMA - Abnormal; Notable for the following components:   Lactic Acid, Venous 3.2 (*)    All other components within normal limits  CULTURE, BLOOD (SINGLE)  CULTURE, BLOOD (SINGLE)  LACTIC ACID, PLASMA  TROPONIN I (HIGH SENSITIVITY)  TROPONIN I (HIGH SENSITIVITY)    EKG None  Radiology DG Chest 2 View  Result Date: 05/01/2022 CLINICAL DATA:  Short of breath.  Recent pneumonia EXAM: CHEST - 2 VIEW COMPARISON:  Chest 04/22/2022 FINDINGS: Pulmonary hyperinflation compatible with COPD. Lungs remain clear without infiltrate or effusion. Heart size and vascularity normal. No change from the prior study. IMPRESSION: No active cardiopulmonary disease. COPD. Electronically Signed   By: Franchot Gallo M.D.   On: 05/01/2022 17:00    Procedures Procedures    Medications Ordered in ED Medications  acetaminophen (TYLENOL) tablet 650 mg (650 mg Oral Given 05/01/22 1549)  ipratropium-albuterol (DUONEB) 0.5-2.5 (3)  MG/3ML nebulizer solution 3 mL (3 mLs Nebulization Given 05/01/22 1549)  lactated ringers bolus 2,000 mL (2,000 mLs Intravenous New Bag/Given 05/01/22 1704)    ED Course/ Medical Decision Making/ A&P                           Medical Decision Making Amount and/or Complexity of Data Reviewed Labs: ordered. Radiology: ordered.  Risk OTC drugs. Prescription drug management.   This patient presents to the ED for concern of dyspnea, this involves an extensive number of treatment options, and  is a complaint that carries with it a high risk of complications and morbidity.  The differential diagnosis includes pneumonia, COPD exacerbation, viral illness, and others   Co morbidities that complicate the patient evaluation  History of pneumonia, COPD   Additional history obtained:  Additional history obtained from EMS External records from outside source obtained and reviewed including discharge summary from November 24   Lab Tests:  I Ordered, and personally interpreted labs.  The pertinent results include: Initial lactic acid 3.2, repeat 2.7; initial and repeat troponin 10; positive influenza A test; creatinine 1.52 (consistent with patient's current baseline); WBC 4.5   Imaging Studies ordered:  I ordered imaging studies including chest x-ray I independently visualized and interpreted imaging which showed No active cardiopulmonary disease. COPD  I agree with the radiologist interpretation   Cardiac Monitoring: / EKG:  The patient was maintained on a cardiac monitor.  I personally viewed and interpreted the cardiac monitored which showed an underlying rhythm of: Sinus rhythm   Consultations Obtained:  I requested consultation with the hospitalist, Dr. Flossie Buffy, and discussed lab and imaging findings as well as pertinent plan - they recommend: admission   Problem List / ED Course / Critical interventions / Medication management   I ordered medication including Tylenol, Zofran,  DuoNeb, and lactated Ringer's bolus Patient's wheezing improved after DuoNeb administration I have reviewed the patients home medicines and have made adjustments as needed    Test / Admission - Considered:  I had the patient ambulate while monitoring pulse ox.  Oxygen saturations dropped from the low 90% range to the mid 70% range on room air.  Patient has a positive influenza test and increased oxygen requirement.  Patient will need admission for further evaluation and management.        Final Clinical Impression(s) / ED Diagnoses Final diagnoses:  None    Rx / DC Orders ED Discharge Orders     None         Ronny Bacon 05/01/22 2119    Regan Lemming, MD 05/02/22 2129

## 2022-05-01 NOTE — Assessment & Plan Note (Signed)
Start Tamiflu BID

## 2022-05-01 NOTE — Assessment & Plan Note (Signed)
-  presented with oxygen desaturation with exertion down to 89% -Secondary to likely acute bronchitis from influenza A -treat with Tamiflu -keep O2 saturation between 88-92%.

## 2022-05-01 NOTE — ED Notes (Signed)
Ambulated pt, pts SpO2 levels ranged between 77-92%. Pt was unable to walk far due to [facility].

## 2022-05-01 NOTE — Assessment & Plan Note (Signed)
-  Does not appear in exacerbation. Likely had hypoxia from acute bronchitis from influenza -PRN duoneb q6hr

## 2022-05-01 NOTE — Assessment & Plan Note (Signed)
Continue antihypertensive pending med rec

## 2022-05-01 NOTE — ED Triage Notes (Signed)
Pt BIB EMS with c/o SOB. Pt was diagnosed with PNA a few days ago and was admitted at [facility]. Pt recently discharged. Pt initial O2 sat was 85% on RA with EMS. Pt used albuterol inhaler without relief.

## 2022-05-01 NOTE — H&P (Signed)
History and Physical    Patient: Jonathan Wilson ZOX:[id number] DOB: 1951/04/09 DOA: 05/01/2022 DOS: the patient was seen and examined on 05/02/2022 PCP: Jonathan Wilson, Jonathan Wilson  Patient coming from: Home  Chief Complaint:  Chief Complaint  Patient presents with   Shortness of Breath   HPI: Jonathan Wilson is a 71 y.o. male with medical history significant of COPD, CKD 3b, HTN, treated hepatitis C, on methadone therapy, AAA who presents with worsening shortness of breath.   Pt was just hospitalized from 04/22/2022-04/26/2022 for COPD exacerbation and AKI. He was treated with nebulizer and steroids and sent home on albuterol and Breo Ellipta inhalers. Has been compliance with medication but continued to have progressive worsening shortness of breath. Cough has been improving. Continues to smoke about 4 cigarettes daily.   In the ED, temperature of 101.17F, mildly tachypneic and had desaturation to 89% with ambulation but stable on room air.   WBC of 4.5, Hgb of 12.7  Na of 140, K of 4, creatinine of 1.52 up from 1.32  Flu PCR + for influenza A. Negative COVID.    Review of Systems: As mentioned in the history of present illness. All other systems reviewed and are negative. Past Medical History:  Diagnosis Date   Anxiety    Anxiety and depression Aug 31, 1998   worse after death of wife April 01, 2013   Depression    Diverticulosis of colon 08-30-09   seen on CT scan in 2009/08/30.    Gout    Hepatitis C before 31-Aug-1998   Hyperlipidemia    Hypertension    Influenza A 06/2013   acute resp failure, did not require intubation.   Obesity    300 # in 08/30/2008, BMI 34 in 04/2013.    Pneumonia    recurrent episodes    Psychosis ([place]) 08-31-98   s/e of treatment for Hepatitis C.     Renal cancer ([place])    Right lower lobe lung mass 05/2008   resolved on follow up CT 2010-08-31.    Vitamin D deficiency    Past Surgical History:  Procedure Laterality Date   CHOLECYSTECTOMY N/A 01/14/2014   Procedure: LAPAROSCOPIC  CHOLECYSTECTOMY WITH INTRAOPERATIVE CHOLANGIOGRAM;  Surgeon: Jonathan Medal, MD;  Location: [facility];  Service: General;  Laterality: N/A;   MANDIBLE FRACTURE SURGERY     ROBOTIC ASSITED PARTIAL NEPHRECTOMY Right 08/07/2015   Procedure: XI ROBOTIC ASSISTED RIGHT PARTIAL NEPHRECTOMY;  Surgeon: Jonathan Gustin, MD;  Location: [facility];  Service: Urology;  Laterality: Right;   Social History:  reports that he has quit smoking. His smoking use included cigarettes. He has a 12.50 pack-year smoking history. He has never used smokeless tobacco. He reports that he does not drink alcohol and does not use drugs.  No Known Allergies  Family History  Problem Relation Age of Onset   Diabetes Mother    Arthritis Mother    Cancer Father        pancreatic cancer    Prior to Admission medications   Medication Sig Start Date End Date Taking? Authorizing Provider  albuterol (VENTOLIN HFA) 108 (90 Base) MCG/ACT inhaler Inhale 2 puffs into the lungs every 6 (six) hours as needed for shortness of breath. 04/26/22   Jonathan Hillock, MD  amLODipine (NORVASC) 5 MG tablet Take 5 mg by mouth daily.    [provider]  aspirin EC 81 MG tablet Take 81 mg by mouth every morning. Swallow whole.    [provider]  doxycycline (  VIBRA-TABS) 100 MG tablet Take 1 tablet (100 mg total) by mouth every 12 (twelve) hours. 04/26/22   Jonathan Hillock, MD  fluticasone furoate-vilanterol (BREO ELLIPTA) 100-25 MCG/ACT AEPB Inhale 1 puff into the lungs daily. 04/26/22   Jonathan Hillock, MD  LINZESS 145 MCG CAPS capsule Take 145 mcg by mouth daily as needed (constipation). 12/10/21   [provider]  lisinopril (ZESTRIL) 10 MG tablet Take 1 tablet (10 mg total) by mouth daily. 04/26/22 07/25/22  Jonathan Hillock, MD  METHADONE HCL PO Take 90 mg by mouth daily. Last take home on 04-19-22 for weekend.    [provider]  methocarbamol (ROBAXIN) 500 MG tablet Take 1 tablet (500 mg total) by mouth every 8 (eight)  hours as needed for muscle spasms. 04/28/22   Jonathan Wilson, Jonathan Cornea, MD  Multiple Vitamin (MULTIVITAMIN WITH MINERALS) TABS tablet Take 1 tablet by mouth every morning. Centrum    [provider]  PARoxetine (PAXIL) 40 MG tablet Take 40 mg by mouth daily.    [provider]    Physical Exam: Vitals:   05/01/22 2215 05/01/22 2245 05/01/22 2300 05/02/22 0000  BP: (!) 130/59 132/71  (!) 140/70  Pulse: 73 75  66  Resp: (!) 21 (!) 22  (!) 24  Temp:   99.7 F (37.6 C) (!) 87 F (30.6 C)  TempSrc:   Oral   SpO2: 96% 92%  96%  Weight:      Height:       Constitutional: NAD, calm, comfortable, nontoxic appearing elderly male laying upright in bed Eyes:  lids and conjunctivae normal ENMT: Mucous membranes are moist.  Neck: normal, supple Respiratory: Diminished lung sounds throughout but no wheezing, no crackles. Normal respiratory effort. No accessory muscle use.  Cardiovascular: Regular rate and rhythm, no murmurs / rubs / gallops. No extremity edema..  Abdomen: no tenderness,  Bowel sounds positive.  Musculoskeletal: no clubbing / cyanosis. No joint deformity upper and lower extremities. Good ROM, no contractures. Normal muscle tone.  Skin: no rashes, lesions, ulcers. No induration Neurologic: CN 2-12 grossly intact.  Strength 5/5 in all 4.  Psychiatric: Normal judgment and insight. Alert and oriented x 3. Normal mood. Data Reviewed:  See HPI  Assessment and Plan: * Acute respiratory failure with hypoxia ([place]) -presented with oxygen desaturation with exertion down to 89% -Secondary to likely acute bronchitis from influenza A -treat with Tamiflu -keep O2 saturation between 88-92%.   AKI (acute kidney injury) ([place]) AKI on CKD 3B - Baseline creatinine 1.4 as of March 2023 - He was just discharged with creatinine of 1.32 now with creatinine of 1.5  -Likely due to continue poor intake, HCTZ, lisinopril -Has received 2 L of IV LR in the ED.  Continuous IV LR overnight. -  Follow creatinine in the morning and hold any nephrotoxic agent  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[place]) Continue methadone pending verification with pharmacy  Essential hypertension Continue antihypertensive pending med rec  Influenza A Start Tamiflu BID  COPD (chronic obstructive pulmonary disease) ([place]) -Does not appear in exacerbation. Likely had hypoxia from acute bronchitis from influenza -PRN duoneb q6hr       Advance Care Planning:   Code Status: Full Code Full  Consults: none  Family Communication: Brother-in-law at bedside but has dementia  Severity of Illness: The appropriate patient status for this patient is OBSERVATION. Observation status is judged to be reasonable and necessary in order to provide the required intensity of service to ensure the patient's  safety. The patient's presenting symptoms, physical exam findings, and initial radiographic and laboratory data in the context of their medical condition is felt to place them at decreased risk for further clinical deterioration. Furthermore, it is anticipated that the patient will be medically stable for discharge from the hospital within 2 midnights of admission.   Author: Orene Desanctis, DO 05/02/2022 12:19 AM  For on call review [email or web address].

## 2022-05-02 ENCOUNTER — Encounter (HOSPITAL_COMMUNITY): Payer: Self-pay | Admitting: Family Medicine

## 2022-05-02 DIAGNOSIS — J101 Influenza due to other identified influenza virus with other respiratory manifestations: Secondary | ICD-10-CM | POA: Diagnosis present

## 2022-05-02 DIAGNOSIS — Z79899 Other long term (current) drug therapy: Secondary | ICD-10-CM | POA: Diagnosis not present

## 2022-05-02 DIAGNOSIS — R6889 Other general symptoms and signs: Secondary | ICD-10-CM | POA: Diagnosis not present

## 2022-05-02 DIAGNOSIS — M109 Gout, unspecified: Secondary | ICD-10-CM | POA: Diagnosis present

## 2022-05-02 DIAGNOSIS — J209 Acute bronchitis, unspecified: Secondary | ICD-10-CM | POA: Diagnosis present

## 2022-05-02 DIAGNOSIS — N179 Acute kidney failure, unspecified: Secondary | ICD-10-CM | POA: Diagnosis present

## 2022-05-02 DIAGNOSIS — Z79891 Long term (current) use of opiate analgesic: Secondary | ICD-10-CM | POA: Diagnosis not present

## 2022-05-02 DIAGNOSIS — F419 Anxiety disorder, unspecified: Secondary | ICD-10-CM | POA: Diagnosis present

## 2022-05-02 DIAGNOSIS — Z6828 Body mass index (BMI) 28.0-28.9, adult: Secondary | ICD-10-CM | POA: Diagnosis not present

## 2022-05-02 DIAGNOSIS — R110 Nausea: Secondary | ICD-10-CM

## 2022-05-02 DIAGNOSIS — E860 Dehydration: Secondary | ICD-10-CM | POA: Diagnosis present

## 2022-05-02 DIAGNOSIS — E785 Hyperlipidemia, unspecified: Secondary | ICD-10-CM | POA: Diagnosis present

## 2022-05-02 DIAGNOSIS — Z7982 Long term (current) use of aspirin: Secondary | ICD-10-CM | POA: Diagnosis not present

## 2022-05-02 DIAGNOSIS — Z85528 Personal history of other malignant neoplasm of kidney: Secondary | ICD-10-CM | POA: Diagnosis not present

## 2022-05-02 DIAGNOSIS — E872 Acidosis, unspecified: Secondary | ICD-10-CM | POA: Diagnosis present

## 2022-05-02 DIAGNOSIS — F32A Depression, unspecified: Secondary | ICD-10-CM | POA: Diagnosis present

## 2022-05-02 DIAGNOSIS — R062 Wheezing: Secondary | ICD-10-CM

## 2022-05-02 DIAGNOSIS — N1832 Chronic kidney disease, stage 3b: Secondary | ICD-10-CM | POA: Diagnosis present

## 2022-05-02 DIAGNOSIS — F17210 Nicotine dependence, cigarettes, uncomplicated: Secondary | ICD-10-CM | POA: Diagnosis present

## 2022-05-02 DIAGNOSIS — J9601 Acute respiratory failure with hypoxia: Secondary | ICD-10-CM | POA: Diagnosis present

## 2022-05-02 DIAGNOSIS — E669 Obesity, unspecified: Secondary | ICD-10-CM | POA: Diagnosis present

## 2022-05-02 DIAGNOSIS — K5900 Constipation, unspecified: Secondary | ICD-10-CM | POA: Diagnosis present

## 2022-05-02 DIAGNOSIS — I129 Hypertensive chronic kidney disease with stage 1 through stage 4 chronic kidney disease, or unspecified chronic kidney disease: Secondary | ICD-10-CM | POA: Diagnosis present

## 2022-05-02 DIAGNOSIS — J440 Chronic obstructive pulmonary disease with acute lower respiratory infection: Secondary | ICD-10-CM | POA: Diagnosis present

## 2022-05-02 DIAGNOSIS — Z1152 Encounter for screening for COVID-19: Secondary | ICD-10-CM | POA: Diagnosis not present

## 2022-05-02 DIAGNOSIS — I714 Abdominal aortic aneurysm, without rupture, unspecified: Secondary | ICD-10-CM | POA: Diagnosis present

## 2022-05-02 DIAGNOSIS — Z7951 Long term (current) use of inhaled steroids: Secondary | ICD-10-CM | POA: Diagnosis not present

## 2022-05-02 LAB — BASIC METABOLIC PANEL
Anion gap: 9 (ref 5–15)
BUN: 16 mg/dL (ref 8–23)
CO2: 26 mmol/L (ref 22–32)
Calcium: 8.7 mg/dL — ABNORMAL LOW (ref 8.9–10.3)
Chloride: 100 mmol/L (ref 98–111)
Creatinine, Ser: 1.19 mg/dL (ref 0.61–1.24)
GFR, Estimated: >60 mL/min (ref >60)
Glucose, Bld: 81 mg/dL (ref 70–99)
Potassium: 4.6 mmol/L (ref 3.5–5.1)
Sodium: 135 mmol/L (ref 135–145)

## 2022-05-02 MED ORDER — OSELTAMIVIR PHOSPHATE 75 MG PO CAPS
75.0000 mg | ORAL_CAPSULE | Freq: Two times a day (BID) | ORAL | Status: DC
  Administered 2022-05-02 – 2022-05-03 (×3): 75 mg via ORAL
  Filled 2022-05-02 (×2): qty 1

## 2022-05-02 MED ORDER — FLUTICASONE FUROATE-VILANTEROL 100-25 MCG/ACT IN AEPB
1.0000 | INHALATION_SPRAY | Freq: Every day | RESPIRATORY_TRACT | Status: DC
  Administered 2022-05-03: 1 via RESPIRATORY_TRACT
  Filled 2022-05-02: qty 28

## 2022-05-02 MED ORDER — PAROXETINE HCL 20 MG PO TABS: 40.0000 mg | ORAL_TABLET | Freq: Every day | ORAL | Status: DC

## 2022-05-02 MED ORDER — ACETAMINOPHEN 325 MG PO TABS
650.0000 mg | ORAL_TABLET | Freq: Four times a day (QID) | ORAL | Status: DC | PRN
  Administered 2022-05-02 (×3): 650 mg via ORAL
  Filled 2022-05-02 (×3): qty 2

## 2022-05-02 MED ORDER — HYDRALAZINE HCL 20 MG/ML IJ SOLN: 10.0000 mg | Freq: Four times a day (QID) | INTRAMUSCULAR | Status: DC | PRN

## 2022-05-02 MED ORDER — ONDANSETRON HCL 4 MG/2ML IJ SOLN
4.0000 mg | Freq: Four times a day (QID) | INTRAMUSCULAR | Status: DC | PRN
  Administered 2022-05-02: 4 mg via INTRAVENOUS
  Filled 2022-05-02: qty 2

## 2022-05-02 MED ORDER — ASPIRIN 81 MG PO TBEC
81.0000 mg | DELAYED_RELEASE_TABLET | Freq: Every morning | ORAL | Status: DC
  Administered 2022-05-02 – 2022-05-03 (×2): 81 mg via ORAL
  Filled 2022-05-02 (×2): qty 1

## 2022-05-02 MED ORDER — OSELTAMIVIR PHOSPHATE 30 MG PO CAPS
30.0000 mg | ORAL_CAPSULE | Freq: Two times a day (BID) | ORAL | Status: DC
  Administered 2022-05-02: 30 mg via ORAL
  Filled 2022-05-02: qty 1

## 2022-05-02 MED ORDER — NICOTINE 14 MG/24HR TD PT24
14.0000 mg | MEDICATED_PATCH | Freq: Every day | TRANSDERMAL | Status: DC
  Filled 2022-05-02 (×2): qty 1

## 2022-05-02 MED ORDER — LINACLOTIDE 145 MCG PO CAPS: 145.0000 ug | ORAL_CAPSULE | Freq: Every day | ORAL | Status: DC | PRN

## 2022-05-02 MED ORDER — LACTATED RINGERS IV SOLN: INTRAVENOUS | Status: DC

## 2022-05-02 MED ORDER — PAROXETINE HCL 20 MG PO TABS
40.0000 mg | ORAL_TABLET | Freq: Every day | ORAL | Status: DC
  Administered 2022-05-02 – 2022-05-03 (×2): 40 mg via ORAL
  Filled 2022-05-02 (×2): qty 2

## 2022-05-02 MED ORDER — METHADONE HCL 10 MG PO TABS
90.0000 mg | ORAL_TABLET | Freq: Every day | ORAL | Status: DC
  Administered 2022-05-02 – 2022-05-03 (×2): 90 mg via ORAL
  Filled 2022-05-02 (×2): qty 9

## 2022-05-02 MED ORDER — AMLODIPINE BESYLATE 5 MG PO TABS
5.0000 mg | ORAL_TABLET | Freq: Every day | ORAL | Status: DC
  Administered 2022-05-02 – 2022-05-03 (×2): 5 mg via ORAL
  Filled 2022-05-02 (×2): qty 1

## 2022-05-02 NOTE — Assessment & Plan Note (Signed)
AKI on CKD 3B - Baseline creatinine 1.4 as of March 2023 - He was just discharged with creatinine of 1.32 now with creatinine of 1.5  -Likely due to continue poor intake, HCTZ, lisinopril -Has received 2 L of IV LR in the ED.  Continuous IV LR overnight. - Follow creatinine in the morning and hold any nephrotoxic agent

## 2022-05-02 NOTE — Progress Notes (Addendum)
[facility]                                                                              Jonathan Wilson, is a 71 y.o. male, DOB - February 20, 1951, HEN:[id number] Admit date - 05/01/2022    Outpatient Primary MD for the patient is Vicenta Aly, [place]  LOS - 0  days  Chief Complaint  Patient presents with   Shortness of Breath       Brief summary   Patient is a 71 year old male with COPD, CKD stage IIIb, HTN, treated hepatitis C on methadone therapy, AAA presented with worsening shortness of breath.  Pt was just hospitalized from 04/22/2022-04/26/2022 for COPD exacerbation and AKI. He was treated with nebulizer and steroids and sent home on albuterol and Breo Ellipta inhalers. Has been compliance with medication but continued to have progressive worsening shortness of breath. Cough has been improving. Continues to smoke about 4 cigarettes daily.    In the ED, temperature of 101.15F, mildly tachypneic and had desaturation to 89% with ambulation but stable on room air.  Influenza A positive, COVID-negative.  creatinine of 1.52 up from 1.32     Assessment & Plan    Principal Problem:   Acute respiratory failure with hypoxia ([facility]) likely due to COPD and influenza A -Still has hypoxia, O2 sats 78 to 88%, was placed on 5 L O2 via [place].  Per patient not on any O2 at home at baseline. -If no significant improvement, will obtain CTA chest to rule out PE -Continue O2, wean as tolerated. -Home O2 evaluation prior to discharge.  Active Problems: Influenza A positive -Continue Tamiflu, supportive treatment    AKI (acute kidney injury) ([place]) on CKD stage IIIb with lactic acidosis, dehydration -Baseline creatinine 1.4, on discharge had a creatinine of 1.3, now trended up to 1.5 on admission.   - Lactic acid 3.2 on admission, improved to 0.8 this a.m. with IV fluids -Continue to hold lisinopril -Per patient had poor p.o. intake in the last 3 days -Creatinine improving, 1.1 today     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[place]) -Resumed home dose of methadone     Essential hypertension -BP currently stable, hold lisinopril  -Resume amlodipine -Placed on IV hydralazine as needed with parameters     COPD (chronic obstructive pulmonary disease) ([facility]) -Continue DuoNebs, Breo -Currently no acute wheezing, likely has hypoxia from acute bronchitis and influenza  Constipation -Continue Linzess  Code Status: Full code DVT Prophylaxis:  enoxaparin (LOVENOX) injection 40 mg Start: 05/02/22 1000   Level of Care: Level of care: Med-Surg Family Communication: Patient's brother-in-law at the bedside   Disposition Plan:      Remains inpatient appropriate: Persistent hypoxia   Procedures:  None Consultants:   None  Antimicrobials:   Anti-infectives (From admission, onward)    Start     Dose/Rate Route Frequency Ordered Stop   05/02/22 1000  oseltamivir (TAMIFLU) capsule 30 mg        30 mg Oral 2 times daily 05/02/22 0235 05/06/22 2159   05/01/22 2200  oseltamivir (TAMIFLU) capsule 75 mg  Status:  Discontinued  75 mg Oral 2 times daily 05/01/22 2118 05/02/22 0235          Medications  enoxaparin (LOVENOX) injection  40 mg Subcutaneous Q24H   methadone  90 mg Oral Daily   oseltamivir  30 mg Oral BID      Subjective:   Jonathan Wilson was seen and examined today.  Feeling weak, dehydrated, nausea.  No acute shortness of breath no chest pain, vomiting or abdominal pain.   Objective:   Vitals:   05/02/22 0900 05/02/22 0915 05/02/22 1000 05/02/22 1030  BP: 112/66  116/65 139/67  Pulse: 79  (!) 53 62  Resp: 20  15 (!) 28  Temp:  98.4 F (36.9 C)    TempSrc:  Oral    SpO2: 100%  98% 99%  Weight:      Height:        Intake/Output Summary (Last 24 hours) at 05/02/2022 1054 Last data filed at 05/02/2022 1032 Gross per 24 hour  Intake 3000 ml  Output 250 ml  Net 2750 ml     Wt Readings from Last 3 Encounters:  05/01/22 100.7 kg   04/23/22 101 kg  03/25/22 101.2 kg     Exam General: Alert and oriented x 3, NAD Cardiovascular: S1 S2 auscultated,  RRR Respiratory: Diminished breath sound at the bases, no wheezing Gastrointestinal: Soft, nontender, nondistended, + bowel sounds Ext: no pedal edema bilaterally Neuro: no new FND's Psych: Normal affect and demeanor, alert and oriented x3     Data Reviewed:  I have personally reviewed following labs    CBC Lab Results  Component Value Date   WBC 4.5 05/01/2022   RBC 3.96 (L) 05/01/2022   HGB 12.7 (L) 05/01/2022   HCT 38.8 (L) 05/01/2022   MCV 98.0 05/01/2022   MCH 32.1 05/01/2022   PLT 175 05/01/2022   MCHC 32.7 05/01/2022   RDW 12.6 05/01/2022   LYMPHSABS 0.5 (L) 05/01/2022   MONOABS 0.7 05/01/2022   EOSABS 0.1 05/01/2022   BASOSABS 0.0 26/71/2458     Last metabolic panel Lab Results  Component Value Date   NA 135 05/02/2022   K 4.6 05/02/2022   CL 100 05/02/2022   CO2 26 05/02/2022   BUN 16 05/02/2022   CREATININE 1.19 05/02/2022   GLUCOSE 81 05/02/2022   GFRNONAA >60 05/02/2022   GFRAA 52 (L) 01/22/2019   CALCIUM 8.7 (L) 05/02/2022   PHOS 4.4 04/30/2015   PROT 6.7 05/01/2022   ALBUMIN 4.0 05/01/2022   BILITOT 0.5 05/01/2022   ALKPHOS 70 05/01/2022   AST 25 05/01/2022   ALT 21 05/01/2022   ANIONGAP 9 05/02/2022    CBG (last 3)  No results for input(s): "GLUCAP" in the last 72 hours.    Coagulation Profile: No results for input(s): "INR", "PROTIME" in the last 168 hours.   Radiology Studies: I have personally reviewed the imaging studies  DG Chest 2 View  Result Date: 05/01/2022 CLINICAL DATA:  Short of breath.  Recent pneumonia EXAM: CHEST - 2 VIEW COMPARISON:  Chest 04/22/2022 FINDINGS: Pulmonary hyperinflation compatible with COPD. Lungs remain clear without infiltrate or effusion. Heart size and vascularity normal. No change from the prior study. IMPRESSION: No active cardiopulmonary disease. COPD. Electronically Signed    By: Franchot Gallo M.D.   On: 05/01/2022 17:00       Chay Mazzoni M.D. [facility] 05/02/2022, 10:54 AM  Available via Epic secure chat 7am-7pm After 7 pm, please refer to night coverage provider listed on  amion.

## 2022-05-03 MED ORDER — LISINOPRIL 10 MG PO TABS: 10.0000 mg | ORAL_TABLET | Freq: Every day | ORAL | 2 refills | Status: DC

## 2022-05-03 MED ORDER — OSELTAMIVIR PHOSPHATE 75 MG PO CAPS: 75.0000 mg | ORAL_CAPSULE | Freq: Two times a day (BID) | ORAL | 0 refills | Status: AC

## 2022-05-03 NOTE — Progress Notes (Signed)
OT Cancellation Note  Patient Details Name: Jonathan Wilson MRN: [id number] DOB: 09/19/1950   Cancelled Treatment:    Reason Eval/Treat Not Completed: Other (comment). RN Reports patient has no therapy needs and is discharging soon.  Josha Weekley L [place] 05/03/2022, 12:54 PM

## 2022-05-03 NOTE — Progress Notes (Addendum)
SATURATION QUALIFICATIONS: (This note is used to comply with regulatory documentation for home oxygen)  Patient Saturations on Room Air at Rest = 90%  Patient Saturations on Room Air while Ambulating = 84%  Patient Saturations on 61 Liters of oxygen while Ambulating = 90%  Please briefly explain why patient needs home oxygen: Acute hypoxic respiratory failure secondary to COPD and influenza A    Lexxus Underhill M.D.  [facility] 05/03/2022, 12:13 PM

## 2022-05-03 NOTE — Discharge Summary (Signed)
Physician Discharge Summary   Patient: Jonathan Wilson MRN: [id number] DOB: March 20, 1951  Admit date:     05/01/2022  Discharge date: 05/03/22  Discharge Physician: Estill Cotta, MD    PCP: Vicenta Aly, FNP   Recommendations at discharge:   Continue Tamiflu 75 mg twice daily for 5 days  Home O2 qualification Patient Saturations on Room Air at Rest = 90%   Patient Saturations on [place] while Ambulating = 84%   Patient Saturations on 46 Liters of oxygen while Ambulating = 90%   Please briefly explain why patient needs home oxygen: Acute hypoxic respiratory failure secondary to COPD and influenza A     Discharge Diagnoses:    Acute respiratory failure with hypoxia ([place])   Influenza A   AKI (acute kidney injury) ([place])   Methadone maintenance therapy patient [facility])   Essential hypertension   COPD (chronic obstructive pulmonary disease) [facility])    Hospital Course:  Patient is a 71 year old male with COPD, CKD stage IIIb, HTN, treated hepatitis C on methadone therapy, AAA presented with worsening shortness of breath.  Pt was just hospitalized from 04/22/2022-04/26/2022 for COPD exacerbation and AKI. He was treated with nebulizer and steroids and sent home on albuterol and Breo Ellipta inhalers. Has been compliance with medication but continued to have progressive worsening shortness of breath. Cough has been improving. Continues to smoke about 4 cigarettes daily.    In the ED, temperature of 101.66F, mildly tachypneic and had desaturation to 89% with ambulation but stable on room air.  Influenza A positive, COVID-negative.  creatinine of 1.52 up from 1.32      Assessment and Plan:   Acute respiratory failure with hypoxia (HCC) likely due to COPD and influenza A -Still has hypoxia, O2 sats 78 to 88%, was placed on 5 L O2 via [place].  Per patient not on any O2 at home at baseline. -Improving, needs 2 L home O2 with exertion. -Continue O2, wean as tolerated. -Home O2 evaluation  completed prior to discharge, needs 2 L O2 via [place].  Influenza A positive -Continue Tamiflu, supportive treatment     AKI (acute kidney injury) ([place]) on CKD stage IIIb with lactic acidosis, dehydration -Baseline creatinine 1.4, on discharge had a creatinine of 1.3, now trended up to 1.5 on admission.   - Lactic acid 3.2 on admission, improved to 0.8 this a.m. with IV fluids -Per patient had poor p.o. intake in the last 3 days prior to admission, improved, creatinine 1.1 at the time of discharge.      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[place]) -Resumed home dose of methadone       Essential hypertension -BP currently stable, continue amlodipine, -Recommended to resume lisinopril in 2 days.       COPD (chronic obstructive pulmonary disease) (HCC) -Continue DuoNebs, Breo -Currently no acute wheezing, likely has hypoxia from acute bronchitis and influenza   Constipation -Continue Linzess     Pain control - [place] Controlled Substance Reporting System database was reviewed. and patient was instructed, not to drive, operate heavy machinery, perform activities at heights, swimming or participation in water activities or provide baby-sitting services while on Pain, Sleep and Anxiety Medications; until their outpatient Physician has advised to do so again. Also recommended to not to take more than prescribed Pain, Sleep and Anxiety Medications.  Consultants: None Procedures performed: None Disposition: Home Diet recommendation:  Discharge Diet Orders (From admission, onward)     Start     Ordered   05/03/22 0000  Diet - low sodium heart healthy        05/03/22 1053           Regular diet DISCHARGE MEDICATION: Allergies as of 05/03/2022   No Known Allergies      Medication List     STOP taking these medications    doxycycline 100 MG tablet Commonly known as: VIBRA-TABS       TAKE these medications    albuterol 108 (90 Base) MCG/ACT inhaler Commonly known  as: VENTOLIN HFA Inhale 2 puffs into the lungs every 6 (six) hours as needed for shortness of breath.   amLODipine 5 MG tablet Commonly known as: NORVASC Take 5 mg by mouth daily.   aspirin EC 81 MG tablet Take 81 mg by mouth every morning. Swallow whole.   Breo Ellipta 100-25 MCG/ACT Aepb Generic drug: fluticasone furoate-vilanterol Inhale 1 puff into the lungs daily.   Linzess 145 MCG Caps capsule Generic drug: linaclotide Take 145 mcg by mouth daily as needed (constipation).   lisinopril 10 MG tablet Commonly known as: ZESTRIL Take 1 tablet (10 mg total) by mouth daily. Start taking on: May 05, 2022 What changed: These instructions start on May 05, 2022. If you are unsure what to do until then, ask your doctor or other care provider.   METHADONE HCL PO Take 90 mg by mouth daily. Verified dose with [place] as 90 mg daily- take home last dose 04-30-22   methocarbamol 500 MG tablet Commonly known as: ROBAXIN Take 1 tablet (500 mg total) by mouth every 8 (eight) hours as needed for muscle spasms.   multivitamin with minerals Tabs tablet Take 1 tablet by mouth every morning. Centrum   oseltamivir 75 MG capsule Commonly known as: TAMIFLU Take 1 capsule (75 mg total) by mouth 2 (two) times daily for 5 days.   PARoxetine 40 MG tablet Commonly known as: PAXIL Take 40 mg by mouth daily.               Durable Medical Equipment  (From admission, onward)           Start     Ordered   05/03/22 1152  For home use only DME oxygen  Once       Question Answer Comment  Length of Need 6 Months   Mode or (Route) Nasal cannula   Liters per Minute 2   Frequency Continuous (stationary and portable oxygen unit needed)   Oxygen conserving device Yes   Oxygen delivery system Gas      05/03/22 1151            Follow-up Information     Vicenta Aly, FNP. Schedule an appointment as soon as possible for a visit in 2 week(s).   Specialty: Nurse  Practitioner Why: for hospital follow-up Contact information: [address] [place] [address] [phone]                Discharge Exam: Danley Danker Weights   05/01/22 1516  Weight: 100.7 kg   S: No acute complaints, wants to go home.  Overall improving.,  No wheezing  Vitals:   05/03/22 1137 05/03/22 1138 05/03/22 1141 05/03/22 1145  BP:      Pulse:      Resp:      Temp:      TempSrc:      SpO2: 90% (!) 84% 90% 92%  Weight:      Height:        Physical  Exam General: Alert and oriented x 3, NAD Cardiovascular: S1 S2 clear, RRR.  Respiratory: CTAB, no wheezing, rales or rhonchi Gastrointestinal: Soft, nontender, nondistended, NBS Ext: no pedal edema bilaterally Neuro: no new deficits Psych: Normal affect and demeanor   Condition at discharge: fair  The results of significant diagnostics from this hospitalization (including imaging, microbiology, ancillary and laboratory) are listed below for reference.   Imaging Studies: DG Chest 2 View  Result Date: 05/01/2022 CLINICAL DATA:  Short of breath.  Recent pneumonia EXAM: CHEST - 2 VIEW COMPARISON:  Chest 04/22/2022 FINDINGS: Pulmonary hyperinflation compatible with COPD. Lungs remain clear without infiltrate or effusion. Heart size and vascularity normal. No change from the prior study. IMPRESSION: No active cardiopulmonary disease. COPD. Electronically Signed   By: Franchot Gallo M.D.   On: 05/01/2022 17:00   DG Lumbar Spine Complete  Result Date: 04/27/2022 CLINICAL DATA:  Status post fall. EXAM: LUMBAR SPINE - COMPLETE 4+ VIEW COMPARISON:  None Available. FINDINGS: There is no evidence of lumbar spine fracture. Alignment is normal. Marked severity endplate sclerosis is seen at the level of L4-L5, with moderate severity endplate sclerosis noted throughout the remainder of the lumbar spine. Bilateral marked severity facet joint hypertrophy is seen. Moderate to marked severity intervertebral disc  space narrowing is seen at the levels of L4-L5 and L5-S1. IMPRESSION: Moderate to marked severity degenerative disc disease, most prominent at the levels of L4-L5 and L5-S1. Electronically Signed   By: Virgina Norfolk M.D.   On: 04/27/2022 19:14   DG Shoulder Right  Result Date: 04/27/2022 CLINICAL DATA:  Post fall. EXAM: RIGHT SHOULDER - 2+ VIEW COMPARISON:  None Available. FINDINGS: There is no evidence of fracture or dislocation. Mild degenerative changes are seen involving the right acromioclavicular joint and right glenohumeral articulation. Soft tissues are unremarkable. IMPRESSION: Mild degenerative changes without evidence of acute fracture or dislocation. Electronically Signed   By: Virgina Norfolk M.D.   On: 04/27/2022 19:04   CT Head Wo Contrast  Result Date: 04/27/2022 CLINICAL DATA:  Poly trauma fall EXAM: CT HEAD WITHOUT CONTRAST CT CERVICAL SPINE WITHOUT CONTRAST TECHNIQUE: Multidetector CT imaging of the head and cervical spine was performed following the standard protocol without intravenous contrast. Multiplanar CT image reconstructions of the cervical spine were also generated. RADIATION DOSE REDUCTION: This exam was performed according to the departmental dose-optimization program which includes automated exposure control, adjustment of the mA and/or kV according to patient size and/or use of iterative reconstruction technique. COMPARISON:  CT brain 04/23/2022 FINDINGS: CT HEAD FINDINGS Brain: No acute territorial infarction, hemorrhage or intracranial mass. Patchy white matter hypodensity likely chronic small vessel ischemic change. Mild atrophy. Nonenlarged ventricles. Vascular: No hyperdense vessels.  Carotid vascular calcification. Skull: Normal. Negative for fracture or focal lesion. Sinuses/Orbits: No acute finding. Other: None CT CERVICAL SPINE FINDINGS Alignment: Straightening of the cervical spine. Images are acquired to the level of T1. Trace anterolisthesis C3 on C4.  Trace retrolisthesis C5 on C6. Facet alignment is maintained. Skull base and vertebrae: No acute fracture. No primary bone lesion or focal pathologic process. Soft tissues and spinal canal: No prevertebral fluid or swelling. No visible canal hematoma. Disc levels: Multilevel degenerative change. Advanced disc space narrowing C4-C5, C5-C6 and C6-C7. Facet degenerative changes at multiple levels with foraminal stenosis. Upper chest: Negative. Other: None IMPRESSION: No CT evidence for acute intracranial abnormality. Atrophy and chronic small vessel ischemic changes of the white matter. Straightening of the cervical spine with degenerative changes. No acute osseous abnormality.  Electronically Signed   By: Donavan Foil M.D.   On: 04/27/2022 19:01   CT Cervical Spine Wo Contrast  Result Date: 04/27/2022 CLINICAL DATA:  Poly trauma fall EXAM: CT HEAD WITHOUT CONTRAST CT CERVICAL SPINE WITHOUT CONTRAST TECHNIQUE: Multidetector CT imaging of the head and cervical spine was performed following the standard protocol without intravenous contrast. Multiplanar CT image reconstructions of the cervical spine were also generated. RADIATION DOSE REDUCTION: This exam was performed according to the departmental dose-optimization program which includes automated exposure control, adjustment of the mA and/or kV according to patient size and/or use of iterative reconstruction technique. COMPARISON:  CT brain 04/23/2022 FINDINGS: CT HEAD FINDINGS Brain: No acute territorial infarction, hemorrhage or intracranial mass. Patchy white matter hypodensity likely chronic small vessel ischemic change. Mild atrophy. Nonenlarged ventricles. Vascular: No hyperdense vessels.  Carotid vascular calcification. Skull: Normal. Negative for fracture or focal lesion. Sinuses/Orbits: No acute finding. Other: None CT CERVICAL SPINE FINDINGS Alignment: Straightening of the cervical spine. Images are acquired to the level of T1. Trace anterolisthesis C3  on C4. Trace retrolisthesis C5 on C6. Facet alignment is maintained. Skull base and vertebrae: No acute fracture. No primary bone lesion or focal pathologic process. Soft tissues and spinal canal: No prevertebral fluid or swelling. No visible canal hematoma. Disc levels: Multilevel degenerative change. Advanced disc space narrowing C4-C5, C5-C6 and C6-C7. Facet degenerative changes at multiple levels with foraminal stenosis. Upper chest: Negative. Other: None IMPRESSION: No CT evidence for acute intracranial abnormality. Atrophy and chronic small vessel ischemic changes of the white matter. Straightening of the cervical spine with degenerative changes. No acute osseous abnormality. Electronically Signed   By: Donavan Foil M.D.   On: 04/27/2022 19:01   ECHOCARDIOGRAM COMPLETE  Result Date: 04/23/2022    ECHOCARDIOGRAM REPORT   Patient Name:   KARRON ALVIZO Bramer Date of Exam: 04/23/2022 Medical Rec #:  [id number]     Height:       74.0 in Accession #:    [id number]    Weight:       223.2 lb Date of Birth:  16-Aug-1950     BSA:          2.277 m Patient Age:    49 years      BP:           110/77 mmHg Patient Gender: M             HR:           52 bpm. Exam Location:  Inpatient Procedure: 2D Echo, Cardiac Doppler and Color Doppler Indications:    R07.9* Chest pain, unspecified  History:        Patient has prior history of Echocardiogram examinations, most                 recent 07/07/2020. Signs/Symptoms:Chest Pain; Risk                 Factors:Hypertension and Dyslipidemia.  Sonographer:    Bernadene Person RDCS Referring Phys: [id number] [place] K Sabena Winner IMPRESSIONS  1. Left ventricular ejection fraction, by estimation, is 50 to 55%. The left ventricle has low normal function. The left ventricle has no regional wall motion abnormalities. Left ventricular diastolic parameters are consistent with Grade I diastolic dysfunction (impaired relaxation).  2. Right ventricular systolic function is normal. The right ventricular size is  normal. There is normal pulmonary artery systolic pressure.  3. The mitral valve is normal in structure. No evidence of mitral valve regurgitation. No  evidence of mitral stenosis.  4. The aortic valve was not well visualized. Aortic valve regurgitation is not visualized.  5. Aortic dilatation noted. There is mild dilatation of the aortic root, measuring 40 mm.  6. The inferior vena cava is dilated in size with >50% respiratory variability, suggesting right atrial pressure of 8 mmHg. FINDINGS  Left Ventricle: Left ventricular ejection fraction, by estimation, is 50 to 55%. The left ventricle has low normal function. The left ventricle has no regional wall motion abnormalities. The left ventricular internal cavity size was normal in size. There is no left ventricular hypertrophy. Left ventricular diastolic parameters are consistent with Grade I diastolic dysfunction (impaired relaxation). Right Ventricle: The right ventricular size is normal. No increase in right ventricular wall thickness. Right ventricular systolic function is normal. There is normal pulmonary artery systolic pressure. The tricuspid regurgitant velocity is 1.17 m/s, and  with an assumed right atrial pressure of 8 mmHg, the estimated right ventricular systolic pressure is 21.3 mmHg. Left Atrium: Left atrial size was normal in size. Right Atrium: Right atrial size was normal in size. Pericardium: There is no evidence of pericardial effusion. Mitral Valve: The mitral valve is normal in structure. No evidence of mitral valve regurgitation. No evidence of mitral valve stenosis. Tricuspid Valve: The tricuspid valve is not well visualized. Tricuspid valve regurgitation is not demonstrated. No evidence of tricuspid stenosis. Aortic Valve: The aortic valve was not well visualized. Aortic valve regurgitation is not visualized. Pulmonic Valve: The pulmonic valve was not well visualized. Pulmonic valve regurgitation is not visualized. No evidence of pulmonic  stenosis. Aorta: The aortic root is normal in size and structure and aortic dilatation noted. There is mild dilatation of the aortic root, measuring 40 mm. Venous: The inferior vena cava is dilated in size with greater than 50% respiratory variability, suggesting right atrial pressure of 8 mmHg. IAS/Shunts: No atrial level shunt detected by color flow Doppler.  LEFT VENTRICLE PLAX 2D LVIDd:         5.40 cm      Diastology LVIDs:         3.20 cm      LV e' medial:    7.40 cm/s LV PW:         0.80 cm      LV E/e' medial:  9.4 LV IVS:        0.80 cm      LV e' lateral:   9.68 cm/s LVOT diam:     2.30 cm      LV E/e' lateral: 7.2 LV SV:         104 LV SV Index:   46 LVOT Area:     4.15 cm  LV Volumes (MOD) LV vol d, MOD A2C: 99.7 ml LV vol d, MOD A4C: 116.0 ml LV vol s, MOD A2C: 46.4 ml LV vol s, MOD A4C: 52.4 ml LV SV MOD A2C:     53.3 ml LV SV MOD A4C:     116.0 ml LV SV MOD BP:      61.4 ml RIGHT VENTRICLE RV S prime:     10.80 cm/s TAPSE (M-mode): 1.8 cm LEFT ATRIUM             Index        RIGHT ATRIUM           Index LA diam:        3.30 cm 1.45 cm/m   RA Area:     20.10 cm LA  Vol [facility]):   43.8 ml 19.23 ml/m  RA Volume:   66.80 ml  29.33 ml/m LA Vol (A4C):   44.1 ml 19.37 ml/m LA Biplane Vol: 43.6 ml 19.15 ml/m  AORTIC VALVE LVOT Vmax:   134.00 cm/s LVOT Vmean:  81.200 cm/s LVOT VTI:    0.250 m  AORTA Ao Root diam: 4.00 cm MITRAL VALVE               TRICUSPID VALVE MV Area (PHT): 3.27 cm    TR Peak grad:   5.5 mmHg MV Decel Time: 232 msec    TR Vmax:        117.00 cm/s MV E velocity: 69.40 cm/s MV A velocity: 69.00 cm/s  SHUNTS MV E/A ratio:  1.01        Systemic VTI:  0.25 m                            Systemic Diam: 2.30 cm Rudean Haskell MD Electronically signed by Rudean Haskell MD Signature Date/Time: 04/23/2022/9:40:06 AM    Final    CT CHEST ABDOMEN PELVIS WO CONTRAST  Result Date: 04/23/2022 CLINICAL DATA:  Sepsis. EXAM: CT CHEST, ABDOMEN AND PELVIS WITHOUT CONTRAST TECHNIQUE:  Multidetector CT imaging of the chest, abdomen and pelvis was performed following the standard protocol without IV contrast. RADIATION DOSE REDUCTION: This exam was performed according to the departmental dose-optimization program which includes automated exposure control, adjustment of the mA and/or kV according to patient size and/or use of iterative reconstruction technique. COMPARISON:  Chest CT dated 10/25/2021 and CT abdomen pelvis dated 07/30/2021. FINDINGS: Evaluation of this exam is limited in the absence of intravenous contrast. CT CHEST FINDINGS Cardiovascular: There is no cardiomegaly or pericardial effusion. Three-vessel coronary vascular calcification. Mild atherosclerotic calcification of the thoracic aorta. No aneurysmal dilatation. The central pulmonary arteries are grossly unremarkable. Mediastinum/Nodes: No hilar or mediastinal adenopathy. The esophagus and thyroid gland are grossly unremarkable. No mediastinal fluid collection. Lungs/Pleura: The lungs are clear. There is no pleural effusion or pneumothorax. The central airways are patent. Musculoskeletal: Degenerative changes of the spine. No acute osseous pathology. CT ABDOMEN PELVIS FINDINGS No intra-abdominal free air or free fluid. Hepatobiliary: The liver is unremarkable. No biliary dilatation. Cholecystectomy. Pancreas: The pancreas is unremarkable as visualized. Spleen: Normal in size without focal abnormality. Adrenals/Urinary Tract: The adrenal glands are unremarkable. Postsurgical changes of the upper pole of the right kidney as seen on the prior CT. There is no hydronephrosis or nephrolithiasis on either side. The visualized ureters and urinary bladder appear unremarkable. Stomach/Bowel: There is a 2.5 cm duodenal diverticulum. There is no bowel obstruction or active inflammation. The appendix is normal. Vascular/Lymphatic: Advanced aortoiliac atherosclerotic disease. There is a 3.5 cm infrarenal abdominal aortic aneurysm which is  poorly evaluated in the absence of contrast but similar to prior CT. The IVC is unremarkable. No portal venous gas. There is no adenopathy. Reproductive: The prostate and seminal vesicles are grossly unremarkable. No pelvic mass. Other: None Musculoskeletal: Osteopenia with degenerative changes of the spine. No acute osseous pathology. IMPRESSION: 1. No acute intrathoracic, abdominal, or pelvic pathology. 2. A 3.5 cm infrarenal abdominal aortic aneurysm similar to prior CT. Recommend follow-up ultrasound every 2 years. This recommendation follows ACR consensus guidelines: White Paper of the ACR Incidental Findings Committee II on Vascular Findings. J Am Coll Radiol 2013; 02:725-366. 3.  Aortic Atherosclerosis (ICD10-I70.0). Electronically Signed   By: Laren Everts.D.  On: 04/23/2022 01:45   CT HEAD WO CONTRAST (5MM)  Result Date: 04/23/2022 CLINICAL DATA:  Headache, new onset (Age >= 60y) EXAM: CT HEAD WITHOUT CONTRAST TECHNIQUE: Contiguous axial images were obtained from the base of the skull through the vertex without intravenous contrast. RADIATION DOSE REDUCTION: This exam was performed according to the departmental dose-optimization program which includes automated exposure control, adjustment of the mA and/or kV according to patient size and/or use of iterative reconstruction technique. COMPARISON:  09/18/2015 FINDINGS: Brain: There is atrophy and chronic small vessel disease changes. No acute intracranial abnormality. Specifically, no hemorrhage, hydrocephalus, mass lesion, acute infarction, or significant intracranial injury. Vascular: No hyperdense vessel or unexpected calcification. Skull: No acute calvarial abnormality. Sinuses/Orbits: No acute findings Other: None IMPRESSION: Atrophy, chronic microvascular disease. No acute intracranial abnormality. Electronically Signed   By: Rolm Baptise M.D.   On: 04/23/2022 01:38   DG Chest 2 View  Result Date: 04/22/2022 CLINICAL DATA:  Two day  history of shortness of breath and cough EXAM: CHEST - 2 VIEW COMPARISON:  Chest radiograph dated 07/30/2021, CT chest dated 10/25/2021 FINDINGS: Mildly hyperinflated lungs. No focal consolidations. No pleural effusion or pneumothorax. The heart size and mediastinal contours are within normal limits. The visualized skeletal structures are unremarkable. Cholecystectomy clips. IMPRESSION: No active cardiopulmonary disease. Electronically Signed   By: Darrin Nipper M.D.   On: 04/22/2022 21:24    Microbiology: Results for orders placed or performed during the hospital encounter of 05/01/22  Resp Panel by RT-PCR (Flu A&B, Covid) Anterior Nasal Swab     Status: Abnormal   Collection Time: 05/01/22  3:35 PM   Specimen: Anterior Nasal Swab  Result Value Ref Range Status   SARS Coronavirus 2 by RT PCR NEGATIVE NEGATIVE Final    Comment: (NOTE) SARS-CoV-2 target nucleic acids are NOT DETECTED.  The SARS-CoV-2 RNA is generally detectable in upper respiratory specimens during the acute phase of infection. The lowest concentration of SARS-CoV-2 viral copies this assay can detect is 138 copies/mL. A negative result does not preclude SARS-Cov-2 infection and should not be used as the sole basis for treatment or other patient management decisions. A negative result may occur with  improper specimen collection/handling, submission of specimen other than nasopharyngeal swab, presence of viral mutation(s) within the areas targeted by this assay, and inadequate number of viral copies(<138 copies/mL). A negative result must be combined with clinical observations, patient history, and epidemiological information. The expected result is Negative.  Fact Sheet for Patients:  EntrepreneurPulse.com.au  Fact Sheet for Healthcare Providers:  IncredibleEmployment.be  This test is no t yet approved or cleared by the [place] FDA and  has been authorized for detection and/or  diagnosis of SARS-CoV-2 by FDA under an Emergency Use Authorization (EUA). This EUA will remain  in effect (meaning this test can be used) for the duration of the COVID-19 declaration under Section 564(b)(1) of the Act, 21 U.S.C.section 360bbb-3(b)(1), unless the authorization is terminated  or revoked sooner.       Influenza A by PCR POSITIVE (A) NEGATIVE Final   Influenza B by PCR NEGATIVE NEGATIVE Final    Comment: (NOTE) The Xpert Xpress SARS-CoV-2/FLU/RSV plus assay is intended as an aid in the diagnosis of influenza from Nasopharyngeal swab specimens and should not be used as a sole basis for treatment. Nasal washings and aspirates are unacceptable for Xpert Xpress SARS-CoV-2/FLU/RSV testing.  Fact Sheet for Patients: EntrepreneurPulse.com.au  Fact Sheet for Healthcare Providers: IncredibleEmployment.be  This test is not yet  approved or cleared by the [place] and has been authorized for detection and/or diagnosis of SARS-CoV-2 by FDA under an Emergency Use Authorization (EUA). This EUA will remain in effect (meaning this test can be used) for the duration of the COVID-19 declaration under Section 564(b)(1) of the Act, 21 U.S.C. section 360bbb-3(b)(1), unless the authorization is terminated or revoked.  Performed at [facility], [place] [address] [place], [place] [address]   Culture, blood (single)     Status: None (Preliminary result)   Collection Time: 05/01/22  3:35 PM   Specimen: BLOOD RIGHT FOREARM  Result Value Ref Range Status   Specimen Description   Final    BLOOD RIGHT FOREARM Performed at [facility], [place] [address] [place], [place] [address]    Special Requests   Final    BOTTLES DRAWN AEROBIC AND ANAEROBIC Blood Culture results may not be optimal due to an excessive volume of blood received in culture bottles Performed at [facility] [address]  [place], [place] [address]    Culture   Final    NO GROWTH 2 DAYS Performed at [facility] [address] [place], [place] [address]    Report Status PENDING  Incomplete  Culture, blood (single)     Status: None (Preliminary result)   Collection Time: 05/01/22  5:49 PM   Specimen: BLOOD LEFT HAND  Result Value Ref Range Status   Specimen Description   Final    BLOOD LEFT HAND Performed at [facility], [place] [address] [place], [place] [address]    Special Requests   Final    BOTTLES DRAWN AEROBIC AND ANAEROBIC Blood Culture results may not be optimal due to an inadequate volume of blood received in culture bottles Performed at [facility] [address] [place], [place] [address]    Culture   Final    NO GROWTH 2 DAYS Performed at [facility] [address] [place], [place] [address]    Report Status PENDING  Incomplete    Labs: CBC: Recent Labs  Lab 05/01/22 1535  WBC 4.5  NEUTROABS 3.1  HGB 12.7*  HCT 38.8*  MCV 98.0  PLT 300   Basic Metabolic Panel: Recent Labs  Lab 05/01/22 1535 05/02/22 0500  NA 140 135  K 4.0 4.6  CL 102 100  CO2 29 26  GLUCOSE 88 81  BUN 25* 16  CREATININE 1.52* 1.19  CALCIUM 9.2 8.7*   Liver Function Tests: Recent Labs  Lab 05/01/22 1535  AST 25  ALT 21  ALKPHOS 70  BILITOT 0.5  PROT 6.7  ALBUMIN 4.0   CBG: No results for input(s): "GLUCAP" in the last 168 hours.  Discharge time spent: greater than 30 minutes.  Signed: Estill Cotta, MD [facility] 05/03/2022

## 2022-05-03 NOTE — TOC Transition Note (Signed)
Transition of Care [facility]) - CM/SW Discharge Note   Patient Details  Name: Jonathan Wilson MRN: [id number] Date of Birth: [birth year]  Transition of Care [facility]) CM/SW Contact:  Lennart Pall, LCSW Phone Number: 05/03/2022, 1:17 PM   Clinical Narrative:     Referral received to assist with need for O2.  Pt aware and agreeable and no preference for DME agency.  Required chart documentation in and order placed with [facility] for delivery to room.  Pt, also, provided with bus passes to use to return to [facility] or [facility].  Pt reporting that he and brother-in-law have received their monthly SS check and will likely stay in a motel tonight.  Pt has connected with [place] to assist him with securing a care facility for his brother-in-law.  No further TOC needs.  Info on local resources placed on AVS.  Final next level of care: Homeless Shelter Barriers to Discharge: No Barriers Identified   Patient Goals and CMS Choice Patient states their goals for this hospitalization and ongoing recovery are:: continue working on securing housing and care for his brother-in-law      Discharge Placement                       Discharge Plan and Services                DME Arranged: Oxygen DME Agency: [employer] Date DME Agency Contacted: 05/03/22 Time DME Agency Contacted: 1200 Representative spoke with at DME Agency: [place] ([place]) Interventions Food Insecurity Interventions: Inpatient TOC Housing Interventions: Inpatient TOC Transportation Interventions: Inpatient TOC Utilities Interventions: Inpatient TOC   Readmission Risk Interventions    05/03/2022    1:07 PM  Readmission Risk Prevention Plan  Transportation Screening Complete  PCP or Specialist Appt within 3-5 Days Complete  HRI or [place] Complete  Social Work Consult for [place] Planning/Counseling Complete  Palliative Care Screening Not Applicable   Medication Review [employer]) Complete

## 2022-05-03 NOTE — Progress Notes (Deleted)
Please see TOC transition note for information.

## 2022-05-03 NOTE — Progress Notes (Signed)
PT Cancellation Note  Patient Details Name: Jonathan Wilson MRN: [id number] DOB: May 07, 1951   Cancelled Treatment:    Reason Eval/Treat Not Completed: PT screened, no needs identified, will sign off (RN stated pt is independent with mobility and does not need PT. Signing off.)   Blondell Reveal Kistler PT 05/03/2022  Acute Rehabilitation Services  Office [phone]

## 2022-05-03 NOTE — Progress Notes (Signed)
Reviewed written d/c instructions w pt and all questions answered. He verbalized understanding. Pt complains that we are not giving him Abxs - explained to pt this is a viral illness only (CXR negative) and that Abxs do not help w the flu. Explained Tamiflu and instructed pt to follow those Rx directions carefully, and to also wear a mask so that he doesn't infect other people. He verbalized understanding. D/C ambulatory w all belongings and his portable O2 tank, w his BIL in stable condition.

## 2022-05-06 LAB — CULTURE, BLOOD (SINGLE)
Culture: NO GROWTH
Culture: NO GROWTH

## 2022-05-28 ENCOUNTER — Ambulatory Visit (INDEPENDENT_AMBULATORY_CARE_PROVIDER_SITE_OTHER): Payer: Medicare Other | Admitting: Podiatry

## 2022-05-28 DIAGNOSIS — M79675 Pain in left toe(s): Secondary | ICD-10-CM

## 2022-05-28 DIAGNOSIS — B351 Tinea unguium: Secondary | ICD-10-CM | POA: Diagnosis not present

## 2022-05-28 DIAGNOSIS — L853 Xerosis cutis: Secondary | ICD-10-CM | POA: Diagnosis not present

## 2022-05-28 DIAGNOSIS — M79674 Pain in right toe(s): Secondary | ICD-10-CM

## 2022-05-28 MED ORDER — AMMONIUM LACTATE 12 % EX LOTN: 1.0000 | TOPICAL_LOTION | CUTANEOUS | 0 refills | Status: AC | PRN

## 2022-05-28 NOTE — Progress Notes (Signed)
  Subjective:  Patient ID: Jonathan Wilson, male    DOB: September 23, 1950,  MRN: [id number]  Chief Complaint  Patient presents with   Nail Problem    Nail trim    71 y.o. male returns for the above complaint.  Patient presents with thickened dystrophic toenails x 10 mild pain on palpation.  Hurts with ambulation worse with pressure.  He has secondary complaint of dry skin.  He is tried over-the-counter medication none of which has helped.  Objective:  There were no vitals filed for this visit. Podiatric Exam: Vascular: dorsalis pedis and posterior tibial pulses are palpable bilateral. Capillary return is immediate. Temperature gradient is WNL. Skin turgor WNL  Sensorium: Normal Semmes Weinstein monofilament test. Normal tactile sensation bilaterally. Nail Exam: Pt has thick disfigured discolored nails with subungual debris noted bilateral entire nail hallux through fifth toenails.  Pain on palpation to the nails. Ulcer Exam: There is no evidence of ulcer or pre-ulcerative changes or infection. Orthopedic Exam: Muscle tone and strength are WNL. No limitations in general ROM. No crepitus or effusions noted.  Skin: No Porokeratosis. No infection or ulcers    Assessment & Plan:   1. Xerosis of skin   2. Pain due to onychomycosis of toenails of both feet     Patient was evaluated and treated and all questions answered.  Xerosis skin -I explained to the patient the etiology of xerosis and various treatment options were extensively discussed.  I explained to the patient the importance of maintaining moisturization of the skin with application of over-the-counter lotion such as Eucerin or Luciderm.  Patient has failed over-the-counter medication he will benefit from ammonium lactate ammonium lactate was sent to the pharmacy       Onychomycosis with pain  -Nails palliatively debrided as below. -Educated on self-care  Procedure: Nail Debridement Rationale: pain  Type of Debridement: manual,  sharp debridement. Instrumentation: Nail nipper, rotary burr. Number of Nails: 10  Procedures and Treatment: Consent by patient was obtained for treatment procedures. The patient understood the discussion of treatment and procedures well. All questions were answered thoroughly reviewed. Debridement of mycotic and hypertrophic toenails, 1 through 5 bilateral and clearing of subungual debris. No ulceration, no infection noted.  Return Visit-Office Procedure: Patient instructed to return to the office for a follow up visit 3 months for continued evaluation and treatment.  Boneta Lucks, DPM    Return in about 3 months (around 08/27/2022) for [facility] .

## 2022-05-31 ENCOUNTER — Encounter (HOSPITAL_COMMUNITY): Payer: Self-pay | Admitting: *Deleted

## 2022-05-31 ENCOUNTER — Emergency Department (HOSPITAL_COMMUNITY): Payer: Medicare Other

## 2022-05-31 ENCOUNTER — Other Ambulatory Visit: Payer: Self-pay

## 2022-05-31 ENCOUNTER — Inpatient Hospital Stay (HOSPITAL_COMMUNITY)
Admission: EM | Admit: 2022-05-31 | Discharge: 2022-06-04 | DRG: 194 | Disposition: A | Discharge status: Home / Self Care | Payer: Medicare Other | Attending: Internal Medicine | Admitting: Internal Medicine

## 2022-05-31 DIAGNOSIS — E785 Hyperlipidemia, unspecified: Secondary | ICD-10-CM | POA: Diagnosis present

## 2022-05-31 DIAGNOSIS — F419 Anxiety disorder, unspecified: Secondary | ICD-10-CM | POA: Diagnosis present

## 2022-05-31 DIAGNOSIS — F32A Depression, unspecified: Secondary | ICD-10-CM | POA: Diagnosis present

## 2022-05-31 DIAGNOSIS — J189 Pneumonia, unspecified organism: Secondary | ICD-10-CM | POA: Diagnosis not present

## 2022-05-31 DIAGNOSIS — B182 Chronic viral hepatitis C: Secondary | ICD-10-CM | POA: Diagnosis present

## 2022-05-31 DIAGNOSIS — Z87891 Personal history of nicotine dependence: Secondary | ICD-10-CM

## 2022-05-31 DIAGNOSIS — Z20822 Contact with and (suspected) exposure to covid-19: Secondary | ICD-10-CM | POA: Diagnosis present

## 2022-05-31 DIAGNOSIS — R531 Weakness: Secondary | ICD-10-CM

## 2022-05-31 DIAGNOSIS — Z79899 Other long term (current) drug therapy: Secondary | ICD-10-CM

## 2022-05-31 DIAGNOSIS — Z833 Family history of diabetes mellitus: Secondary | ICD-10-CM

## 2022-05-31 DIAGNOSIS — Z8261 Family history of arthritis: Secondary | ICD-10-CM | POA: Diagnosis not present

## 2022-05-31 DIAGNOSIS — Z85528 Personal history of other malignant neoplasm of kidney: Secondary | ICD-10-CM | POA: Diagnosis not present

## 2022-05-31 DIAGNOSIS — I7143 Infrarenal abdominal aortic aneurysm, without rupture: Secondary | ICD-10-CM | POA: Diagnosis present

## 2022-05-31 DIAGNOSIS — Z5900 Homelessness unspecified: Secondary | ICD-10-CM | POA: Diagnosis not present

## 2022-05-31 DIAGNOSIS — Z7982 Long term (current) use of aspirin: Secondary | ICD-10-CM

## 2022-05-31 DIAGNOSIS — E860 Dehydration: Secondary | ICD-10-CM | POA: Diagnosis present

## 2022-05-31 DIAGNOSIS — F1120 Opioid dependence, uncomplicated: Secondary | ICD-10-CM | POA: Diagnosis present

## 2022-05-31 DIAGNOSIS — N179 Acute kidney failure, unspecified: Secondary | ICD-10-CM | POA: Diagnosis present

## 2022-05-31 DIAGNOSIS — K5900 Constipation, unspecified: Secondary | ICD-10-CM | POA: Diagnosis present

## 2022-05-31 DIAGNOSIS — Z9049 Acquired absence of other specified parts of digestive tract: Secondary | ICD-10-CM

## 2022-05-31 DIAGNOSIS — Z800 Family history of malignant neoplasm of digestive organs: Secondary | ICD-10-CM | POA: Diagnosis not present

## 2022-05-31 DIAGNOSIS — N1832 Chronic kidney disease, stage 3b: Secondary | ICD-10-CM | POA: Diagnosis present

## 2022-05-31 DIAGNOSIS — J441 Chronic obstructive pulmonary disease with (acute) exacerbation: Secondary | ICD-10-CM | POA: Diagnosis present

## 2022-05-31 DIAGNOSIS — Z905 Acquired absence of kidney: Secondary | ICD-10-CM

## 2022-05-31 DIAGNOSIS — J440 Chronic obstructive pulmonary disease with acute lower respiratory infection: Secondary | ICD-10-CM | POA: Diagnosis present

## 2022-05-31 DIAGNOSIS — I2721 Secondary pulmonary arterial hypertension: Secondary | ICD-10-CM | POA: Diagnosis present

## 2022-05-31 DIAGNOSIS — I10 Essential (primary) hypertension: Secondary | ICD-10-CM | POA: Diagnosis present

## 2022-05-31 DIAGNOSIS — B1920 Unspecified viral hepatitis C without hepatic coma: Secondary | ICD-10-CM | POA: Diagnosis present

## 2022-05-31 DIAGNOSIS — I2510 Atherosclerotic heart disease of native coronary artery without angina pectoris: Secondary | ICD-10-CM | POA: Diagnosis present

## 2022-05-31 DIAGNOSIS — Z7951 Long term (current) use of inhaled steroids: Secondary | ICD-10-CM

## 2022-05-31 LAB — RESP PANEL BY RT-PCR (RSV, FLU A&B, COVID)  RVPGX2
Influenza A by PCR: NEGATIVE
Influenza B by PCR: NEGATIVE
Resp Syncytial Virus by PCR: NEGATIVE
SARS Coronavirus 2 by RT PCR: NEGATIVE

## 2022-05-31 LAB — COMPREHENSIVE METABOLIC PANEL
ALT: 10 U/L (ref 0–44)
AST: 15 U/L (ref 15–41)
Albumin: 3.7 g/dL (ref 3.5–5.0)
Alkaline Phosphatase: 61 U/L (ref 38–126)
Anion gap: 8 (ref 5–15)
BUN: 21 mg/dL (ref 8–23)
CO2: 27 mmol/L (ref 22–32)
Calcium: 9.3 mg/dL (ref 8.9–10.3)
Chloride: 104 mmol/L (ref 98–111)
Creatinine, Ser: 1.62 mg/dL — ABNORMAL HIGH (ref 0.61–1.24)
GFR, Estimated: 45 mL/min — ABNORMAL LOW (ref >60)
Glucose, Bld: 79 mg/dL (ref 70–99)
Potassium: 5 mmol/L (ref 3.5–5.1)
Sodium: 139 mmol/L (ref 135–145)
Total Bilirubin: 0.8 mg/dL (ref 0.3–1.2)
Total Protein: 7.1 g/dL (ref 6.5–8.1)

## 2022-05-31 LAB — CBC
HCT: 33 % — ABNORMAL LOW (ref 39.0–52.0)
Hemoglobin: 10.6 g/dL — ABNORMAL LOW (ref 13.0–17.0)
MCH: 31 pg (ref 26.0–34.0)
MCHC: 32.1 g/dL (ref 30.0–36.0)
MCV: 96.5 fL (ref 80.0–100.0)
Platelets: 235 10*3/uL (ref 150–400)
RBC: 3.42 MIL/uL — ABNORMAL LOW (ref 4.22–5.81)
RDW: 12.8 % (ref 11.5–15.5)
WBC: 4.7 10*3/uL (ref 4.0–10.5)
nRBC: 0 % (ref 0.0–0.2)

## 2022-05-31 LAB — CBC WITH DIFFERENTIAL/PLATELET
Abs Immature Granulocytes: 0.01 10*3/uL (ref 0.00–0.07)
Basophils Absolute: 0 10*3/uL (ref 0.0–0.1)
Basophils Relative: 1 %
Eosinophils Absolute: 0.2 10*3/uL (ref 0.0–0.5)
Eosinophils Relative: 3 %
HCT: 37.5 % — ABNORMAL LOW (ref 39.0–52.0)
Hemoglobin: 12 g/dL — ABNORMAL LOW (ref 13.0–17.0)
Immature Granulocytes: 0 %
Lymphocytes Relative: 21 %
Lymphs Abs: 1.1 10*3/uL (ref 0.7–4.0)
MCH: 30.7 pg (ref 26.0–34.0)
MCHC: 32 g/dL (ref 30.0–36.0)
MCV: 95.9 fL (ref 80.0–100.0)
Monocytes Absolute: 0.5 10*3/uL (ref 0.1–1.0)
Monocytes Relative: 10 %
Neutro Abs: 3.4 10*3/uL (ref 1.7–7.7)
Neutrophils Relative %: 65 %
Platelets: 300 10*3/uL (ref 150–400)
RBC: 3.91 MIL/uL — ABNORMAL LOW (ref 4.22–5.81)
RDW: 12.8 % (ref 11.5–15.5)
WBC: 5.3 10*3/uL (ref 4.0–10.5)
nRBC: 0 % (ref 0.0–0.2)

## 2022-05-31 LAB — BRAIN NATRIURETIC PEPTIDE: B Natriuretic Peptide: 63.3 pg/mL (ref 0.0–100.0)

## 2022-05-31 LAB — CREATININE, SERUM
Creatinine, Ser: 1.58 mg/dL — ABNORMAL HIGH (ref 0.61–1.24)
GFR, Estimated: 47 mL/min — ABNORMAL LOW (ref >60)

## 2022-05-31 LAB — TROPONIN I (HIGH SENSITIVITY)
Troponin I (High Sensitivity): 7 ng/L (ref <18)
Troponin I (High Sensitivity): 8 ng/L (ref <18)

## 2022-05-31 MED ORDER — LISINOPRIL 10 MG PO TABS: 10.0000 mg | ORAL_TABLET | Freq: Every day | ORAL | Status: DC

## 2022-05-31 MED ORDER — ACETAMINOPHEN 325 MG PO TABS
650.0000 mg | ORAL_TABLET | Freq: Once | ORAL | Status: AC
  Administered 2022-05-31: 650 mg via ORAL
  Filled 2022-05-31: qty 2

## 2022-05-31 MED ORDER — AMLODIPINE BESYLATE 5 MG PO TABS
5.0000 mg | ORAL_TABLET | Freq: Every day | ORAL | Status: DC
  Administered 2022-06-02 – 2022-06-04 (×3): 5 mg via ORAL
  Filled 2022-05-31 (×4): qty 1

## 2022-05-31 MED ORDER — ASPIRIN 81 MG PO TBEC
81.0000 mg | DELAYED_RELEASE_TABLET | Freq: Every morning | ORAL | Status: DC
  Administered 2022-06-01 – 2022-06-04 (×4): 81 mg via ORAL
  Filled 2022-05-31 (×4): qty 1

## 2022-05-31 MED ORDER — SODIUM CHLORIDE 0.9 % IV SOLN
1.0000 g | Freq: Once | INTRAVENOUS | Status: AC
  Administered 2022-05-31: 1 g via INTRAVENOUS
  Filled 2022-05-31: qty 10

## 2022-05-31 MED ORDER — ADULT MULTIVITAMIN W/MINERALS CH
1.0000 | ORAL_TABLET | Freq: Every morning | ORAL | Status: DC
  Administered 2022-06-01 – 2022-06-04 (×4): 1 via ORAL
  Filled 2022-05-31 (×4): qty 1

## 2022-05-31 MED ORDER — SODIUM CHLORIDE 0.9 % IV BOLUS
500.0000 mL | Freq: Once | INTRAVENOUS | Status: AC
  Administered 2022-05-31: 500 mL via INTRAVENOUS

## 2022-05-31 MED ORDER — METHADONE HCL 10 MG PO TABS
90.0000 mg | ORAL_TABLET | Freq: Every day | ORAL | Status: DC
  Administered 2022-06-01 – 2022-06-04 (×4): 90 mg via ORAL
  Filled 2022-05-31 (×4): qty 9

## 2022-05-31 MED ORDER — SODIUM CHLORIDE 0.9 % IV SOLN
500.0000 mg | Freq: Once | INTRAVENOUS | Status: AC
  Administered 2022-05-31: 500 mg via INTRAVENOUS
  Filled 2022-05-31: qty 5

## 2022-05-31 MED ORDER — SODIUM CHLORIDE 0.9 % IV SOLN
500.0000 mg | INTRAVENOUS | Status: DC
  Administered 2022-06-01 – 2022-06-02 (×2): 500 mg via INTRAVENOUS
  Filled 2022-05-31 (×2): qty 5

## 2022-05-31 MED ORDER — IPRATROPIUM-ALBUTEROL 0.5-2.5 (3) MG/3ML IN SOLN: 3.0000 mL | RESPIRATORY_TRACT | Status: DC | PRN

## 2022-05-31 MED ORDER — SODIUM CHLORIDE 0.9 % IV SOLN
2.0000 g | INTRAVENOUS | Status: DC
  Administered 2022-06-01 – 2022-06-02 (×2): 2 g via INTRAVENOUS
  Filled 2022-05-31 (×2): qty 20

## 2022-05-31 MED ORDER — ONDANSETRON 4 MG PO TBDP
4.0000 mg | ORAL_TABLET | Freq: Once | ORAL | Status: AC
  Administered 2022-05-31: 4 mg via ORAL
  Filled 2022-05-31: qty 1

## 2022-05-31 MED ORDER — FLUTICASONE FUROATE-VILANTEROL 100-25 MCG/ACT IN AEPB
1.0000 | INHALATION_SPRAY | Freq: Every day | RESPIRATORY_TRACT | Status: DC
  Administered 2022-06-01 – 2022-06-04 (×4): 1 via RESPIRATORY_TRACT
  Filled 2022-05-31: qty 28

## 2022-05-31 MED ORDER — IOHEXOL 350 MG/ML SOLN
50.0000 mL | Freq: Once | INTRAVENOUS | Status: AC | PRN
  Administered 2022-05-31: 50 mL via INTRAVENOUS

## 2022-05-31 MED ORDER — PAROXETINE HCL 20 MG PO TABS
40.0000 mg | ORAL_TABLET | Freq: Every day | ORAL | Status: DC
  Administered 2022-06-01 – 2022-06-04 (×4): 40 mg via ORAL
  Filled 2022-05-31 (×4): qty 2

## 2022-05-31 MED ORDER — ENOXAPARIN SODIUM 30 MG/0.3ML IJ SOSY
30.0000 mg | PREFILLED_SYRINGE | INTRAMUSCULAR | Status: DC
  Administered 2022-06-01: 30 mg via SUBCUTANEOUS
  Filled 2022-05-31: qty 0.3

## 2022-05-31 NOTE — H&P (Signed)
PCP:   Vicenta Aly, FNP   Chief Complaint: Shortness of breath   HPI: This is a 71 year old male with past medical history of CAD, methadone dependency, hypertension, anxiety/depression, dyslipidemia, hep C, history of renal cancer and infrarenal AAA.  The patient has had multiple recent admissions 11/20-24 and 11/29-12/1.  His last admission was for influenza.  Per patient he believes he was discharged too soon both times.  He was discharged on oxygen which he eventually weaned himself from.  He states he has not really felt well since discharge, however, on Tuesday he felt the best he had felt in 2 weeks.  On 09/02/22 he started getting sick again.  He developed nausea, vomiting, terrible headache, a cough that was occasionally productive, wheezing, pleuritic type chest pain secondary to his cough (he believes).  He also endorses being lightheaded and dizzy, weak with anorexia.  He further endorses fevers and chills.  He denies diarrhea or falling.  He states prior to Tuesday he will be walking several miles without issues.  Over the last 5 to 6 days he walks little bit and he is out of the ER.  He had stopped using the oxygen given at discharge.  He has since restarted using his oxygen.  History provided by the patient.  His father who has dementia is at bedside  Review of Systems:  The patient denies anorexia, fever, weight loss,, vision loss, decreased hearing, hoarseness, chest pain, syncope, dyspnea on exertion, peripheral edema, balance deficits, hemoptysis, abdominal pain, melena, hematochezia, severe indigestion/heartburn, hematuria, incontinence, genital sores, muscle weakness, suspicious skin lesions, transient blindness, difficulty walking, depression, unusual weight change, abnormal bleeding, enlarged lymph nodes, angioedema, and breast masses. Positive: Shortness of breath, cough, wheeze, lightheadedness, dizziness, anorexia, pleuritic chest pains, fevers and chills  Past Medical  History: Past Medical History:  Diagnosis Date   Anxiety    Anxiety and depression 09/02/98   worse after death of wife 04/03/2013   Depression    Diverticulosis of colon 2009-09-01   seen on CT scan in 09-01-09.    Gout    Hepatitis C before 09-02-1998   Hyperlipidemia    Hypertension    Influenza A 06/2013   acute resp failure, did not require intubation.   Obesity    300 # in 09/01/2008, BMI 34 in 04/2013.    Pneumonia    recurrent episodes    Psychosis ([place]) 1998/09/02   s/e of treatment for Hepatitis C.     Renal cancer ([place])    Right lower lobe lung mass 05/2008   resolved on follow up CT 02-Sep-2010.    Vitamin D deficiency    Past Surgical History:  Procedure Laterality Date   CHOLECYSTECTOMY N/A 01/14/2014   Procedure: LAPAROSCOPIC CHOLECYSTECTOMY WITH INTRAOPERATIVE CHOLANGIOGRAM;  Surgeon: Shann Medal, MD;  Location: [facility]S;  Service: General;  Laterality: N/A;   MANDIBLE FRACTURE SURGERY     ROBOTIC ASSITED PARTIAL NEPHRECTOMY Right 08/07/2015   Procedure: XI ROBOTIC ASSISTED RIGHT PARTIAL NEPHRECTOMY;  Surgeon: Cleon Gustin, MD;  Location: [facility] ORS;  Service: Urology;  Laterality: Right;    Medications: Prior to Admission medications   Medication Sig Start Date End Date Taking? Authorizing Provider  albuterol (VENTOLIN HFA) 108 (90 Base) MCG/ACT inhaler Inhale 2 puffs into the lungs every 6 (six) hours as needed for shortness of breath. 04/26/22   Oswald Hillock, MD  amLODipine (NORVASC) 5 MG tablet Take 5 mg by mouth daily.    [provider]  ammonium lactate (AMLACTIN) 12 % lotion Apply 1 Application topically as needed for dry skin. 05/28/22   Felipa Furnace, DPM  aspirin EC 81 MG tablet Take 81 mg by mouth every morning. Swallow whole.    [provider]  fluticasone furoate-vilanterol (BREO ELLIPTA) 100-25 MCG/ACT AEPB Inhale 1 puff into the lungs daily. 04/26/22   Oswald Hillock, MD  LINZESS 145 MCG CAPS capsule Take 145 mcg by mouth daily as needed (constipation). 12/10/21    [provider]  lisinopril (ZESTRIL) 10 MG tablet Take 1 tablet (10 mg total) by mouth daily. 05/05/22 08/03/22  Rai, Ripudeep Raliegh Ip, MD  METHADONE HCL PO Take 90 mg by mouth daily. Verified dose with [place] as 90 mg daily- take home last dose 04-30-22    [provider]  methocarbamol (ROBAXIN) 500 MG tablet Take 1 tablet (500 mg total) by mouth every 8 (eight) hours as needed for muscle spasms. Patient not taking: Reported on 05/02/2022 04/28/22   Mesner, Corene Cornea, MD  Multiple Vitamin (MULTIVITAMIN WITH MINERALS) TABS tablet Take 1 tablet by mouth every morning. Centrum    [provider]  PARoxetine (PAXIL) 40 MG tablet Take 40 mg by mouth daily.    [provider]    Allergies:  No Known Allergies  Social History:  reports that he has quit smoking. His smoking use included cigarettes. He has a 12.50 pack-year smoking history. He has never used smokeless tobacco. He reports that he does not drink alcohol and does not use drugs.  Family History: Family History  Problem Relation Age of Onset   Diabetes Mother    Arthritis Mother    Cancer Father        pancreatic cancer    Physical Exam: Vitals:   05/31/22 2015 05/31/22 2020 05/31/22 2100 05/31/22 2115  BP: (!) 96/53  109/67 115/63  Pulse: (!) 55 (!) 59 (!) 59 (!) 50  Resp: '14 15 14 13  '$ Temp:      TempSrc:      SpO2: 90% 93% 97% 98%  Weight:      Height:        General:  Alert and oriented times three, well developed and nourished, no acute distress Eyes: PERRLA, pink conjunctiva, no scleral icterus ENT: Moist oral mucosa, neck supple, no thyromegaly Lungs: clear to ascultation, no wheeze, no crackles, no use of accessory muscles Cardiovascular: regular rate and rhythm, no regurgitation, no gallops, no murmurs. No carotid bruits, no JVD Abdomen: soft, positive BS, non-tender, non-distended, no organomegaly, not an acute abdomen GU: not examined Neuro: CN II - XII grossly intact, sensation  intact Musculoskeletal: strength 5/5 all extremities, no clubbing, cyanosis or edema Skin: no rash, no subcutaneous crepitation, no decubitus Psych: appropriate patient   Labs on Admission:  Recent Labs    05/31/22 1522  NA 139  K 5.0  CL 104  CO2 27  GLUCOSE 79  BUN 21  CREATININE 1.62*  CALCIUM 9.3   Recent Labs    05/31/22 1522  AST 15  ALT 10  ALKPHOS 61  BILITOT 0.8  PROT 7.1  ALBUMIN 3.7    Recent Labs    05/31/22 1522  WBC 5.3  NEUTROABS 3.4  HGB 12.0*  HCT 37.5*  MCV 95.9  PLT 300    Micro Results: Recent Results (from the past 240 hour(s))  Resp panel by RT-PCR (RSV, Flu A&B, Covid) Anterior Nasal Swab     Status: None   Collection Time: 05/31/22  3:29 PM   Specimen: Anterior Nasal Swab  Result Value Ref Range Status   SARS Coronavirus 2 by RT PCR NEGATIVE NEGATIVE Final    Comment: (NOTE) SARS-CoV-2 target nucleic acids are NOT DETECTED.  The SARS-CoV-2 RNA is generally detectable in upper respiratory specimens during the acute phase of infection. The lowest concentration of SARS-CoV-2 viral copies this assay can detect is 138 copies/mL. A negative result does not preclude SARS-Cov-2 infection and should not be used as the sole basis for treatment or other patient management decisions. A negative result may occur with  improper specimen collection/handling, submission of specimen other than nasopharyngeal swab, presence of viral mutation(s) within the areas targeted by this assay, and inadequate number of viral copies(<138 copies/mL). A negative result must be combined with clinical observations, patient history, and epidemiological information. The expected result is Negative.  Fact Sheet for Patients:  EntrepreneurPulse.com.au  Fact Sheet for Healthcare Providers:  IncredibleEmployment.be  This test is no t yet approved or cleared by the [place] FDA and  has been authorized for detection and/or  diagnosis of SARS-CoV-2 by FDA under an Emergency Use Authorization (EUA). This EUA will remain  in effect (meaning this test can be used) for the duration of the COVID-19 declaration under Section 564(b)(1) of the Act, 21 U.S.C.section 360bbb-3(b)(1), unless the authorization is terminated  or revoked sooner.       Influenza A by PCR NEGATIVE NEGATIVE Final   Influenza B by PCR NEGATIVE NEGATIVE Final    Comment: (NOTE) The Xpert Xpress SARS-CoV-2/FLU/RSV plus assay is intended as an aid in the diagnosis of influenza from Nasopharyngeal swab specimens and should not be used as a sole basis for treatment. Nasal washings and aspirates are unacceptable for Xpert Xpress SARS-CoV-2/FLU/RSV testing.  Fact Sheet for Patients: EntrepreneurPulse.com.au  Fact Sheet for Healthcare Providers: IncredibleEmployment.be  This test is not yet approved or cleared by the [place] FDA and has been authorized for detection and/or diagnosis of SARS-CoV-2 by FDA under an Emergency Use Authorization (EUA). This EUA will remain in effect (meaning this test can be used) for the duration of the COVID-19 declaration under Section 564(b)(1) of the Act, 21 U.S.C. section 360bbb-3(b)(1), unless the authorization is terminated or revoked.     Resp Syncytial Virus by PCR NEGATIVE NEGATIVE Final    Comment: (NOTE) Fact Sheet for Patients: EntrepreneurPulse.com.au  Fact Sheet for Healthcare Providers: IncredibleEmployment.be  This test is not yet approved or cleared by the [place] FDA and has been authorized for detection and/or diagnosis of SARS-CoV-2 by FDA under an Emergency Use Authorization (EUA). This EUA will remain in effect (meaning this test can be used) for the duration of the COVID-19 declaration under Section 564(b)(1) of the Act, 21 U.S.C. section 360bbb-3(b)(1), unless the authorization is terminated  or revoked.  Performed at [facility], [place] [address], [place], [place] [address]      Radiological Exams on Admission: CT Chest W Contrast  Result Date: 05/31/2022 CLINICAL DATA:  Pneumonia, suspected complications EXAM: CT CHEST WITH CONTRAST TECHNIQUE: Multidetector CT imaging of the chest was performed during intravenous contrast administration. RADIATION DOSE REDUCTION: This exam was performed according to the departmental dose-optimization program which includes automated exposure control, adjustment of the mA and/or kV according to patient size and/or use of iterative reconstruction technique. CONTRAST:  40m OMNIPAQUE IOHEXOL 350 MG/ML SOLN COMPARISON:  Chest radiographs done earlier today and chest radiographs done on 05/01/2022 TD FINDINGS: Cardiovascular: Coronary artery calcifications are seen.  There are scattered calcifications in thoracic aorta and its major branches. There is ectasia of main pulmonary artery measuring 3.5 cm suggesting possible pulmonary arterial hypertension. There are no filling defects in central pulmonary arteries. Contrast density in the peripheral pulmonary artery branches is less than adequate to evaluate the lumen. Mediastinum/Nodes: There are slightly enlarged lymph nodes in mediastinum, possibly suggesting reactive hyperplasia. Lungs/Pleura: There are new patchy infiltrates seen lingula and both lower lobes, more so in the left lower lobe suggesting multifocal pneumonia. There is no pleural effusion or pneumothorax. Centrilobular emphysema is seen. There is mild peribronchial thickening. There is no pleural effusion or pneumothorax. Upper Abdomen: Surgical clips are seen in gallbladder fossa. Musculoskeletal: No acute findings are seen. IMPRESSION: There is interval appearance of patchy alveolar infiltrates in lingula and both lower lobes, more so in the left lower lobe suggesting multifocal pneumonia. Follow-up studies in 3-4 weeks following trial of  antibiotic therapy may be considered to assess resolution. There is no pleural effusion. There is ectasia of main pulmonary artery suggesting pulmonary arterial hypertension. There are no intraluminal filling defects seen central pulmonary artery branches. Evaluation of peripheral pulmonary artery branches is limited due to less than optimal contrast enhancement in this routine CT chest. If there is clinical suspicion for pulmonary embolism, follow-up CT angiogram should be considered. Coronary artery calcifications are seen.  Aortic atherosclerosis. Electronically Signed   By: Elmer Picker M.D.   On: 05/31/2022 21:03   DG Chest 2 View  Result Date: 05/31/2022 CLINICAL DATA:  Cough.  Chest pain. EXAM: CHEST - 2 VIEW COMPARISON:  May 01, 2022. FINDINGS: The heart size and mediastinal contours are within normal limits. Right lung is clear. Mild left lingular opacity is noted concerning for pneumonia or atelectasis. The visualized skeletal structures are unremarkable. IMPRESSION: Mild left lingular opacity concerning for pneumonia or atelectasis. Followup PA and lateral chest X-ray is recommended in 3-4 weeks following trial of antibiotic therapy to ensure resolution and exclude underlying malignancy. Electronically Signed   By: Marijo Conception M.D.   On: 05/31/2022 16:02    Assessment/Plan Present on Admission:  Multifocal pneumonia -Admit to MedSurg -Pneumonia order set initiated -IV Rocephin and azithromycin -Blood and urine cultures -Continue Breo inhaler -Oxygen if needed, patient currently on room air -Nebulizers as needed   AKI (acute kidney injury) ([address]) -Gentle IV fluid hydration -BMP in a.m.   Essential hypertension -Hold lisinopril with his acute kidney injury.  Continue Norvasc  Anxiety and depression -Continue Paxil.  Chronic methadone dependency -Methadone resumed  Hepatitis C -   Myka Hitz 05/31/2022, 9:57 PM

## 2022-05-31 NOTE — ED Provider Triage Note (Signed)
Emergency Medicine Provider Triage Evaluation Note  SELIG Wilson , a 71 y.o. male  was evaluated in triage.  Pt complains of cough congestion. States that he was recently discharged with an AKI and pneumonia from the flu and has not felt any better.  States that over the last few days has become more short of breath and developed chest pain.  Also endorses nausea without vomiting or abdominal pain.  He is concerned that his pneumonia is back.  Denies fevers or chills.  Review of Systems  Positive:  Negative:   Physical Exam  BP 131/73   Pulse (!) 57   Temp 97.7 F (36.5 C) (Oral)   Resp 14   Ht '6\' 2"'$  (1.88 m)   Wt 100.7 kg   SpO2 97%   BMI 28.50 kg/m  Gen:   Awake, no distress   Resp:  Normal effort  MSK:   Moves extremities without difficulty  Other:    Medical Decision Making  Medically screening exam initiated at 3:23 PM.  Appropriate orders placed.  Jonathan Wilson was informed that the remainder of the evaluation will be completed by another provider, this initial triage assessment does not replace that evaluation, and the importance of remaining in the ED until their evaluation is complete.     Bud Face, PA-C 05/31/22 1524

## 2022-05-31 NOTE — ED Provider Notes (Signed)
[facility] EMERGENCY DEPARTMENT Provider Note   CSN: [id number] Arrival date & time: 05/31/22  1434     History  Chief Complaint  Patient presents with   Weakness   Generalized Body Aches    Jonathan Wilson is a 71 y.o. male.  He is a former smoker with history of COPD.  He is complaining of worsening cough shortness of breath pain in his chest nausea and generalized weakness that started since his discharge about a month ago for influenza and dehydration.  He said he never really bounced back.  He is not sure if he has had any fevers.  He has had a cough productive of some white sputum.  He also feels like there is something going on with his kidneys but not endorsing any pain or specific symptoms.  The history is provided by the patient.  Weakness Severity:  Moderate Onset quality:  Gradual Timing:  Constant Progression:  Worsening Chronicity:  Recurrent Relieved by:  Nothing Worsened by:  Activity Ineffective treatments:  Rest Associated symptoms: chest pain, cough, difficulty walking, nausea and shortness of breath   Associated symptoms: no abdominal pain and no vomiting        Home Medications Prior to Admission medications   Medication Sig Start Date End Date Taking? Authorizing Provider  albuterol (VENTOLIN HFA) 108 (90 Base) MCG/ACT inhaler Inhale 2 puffs into the lungs every 6 (six) hours as needed for shortness of breath. 04/26/22   Oswald Hillock, MD  amLODipine (NORVASC) 5 MG tablet Take 5 mg by mouth daily.    [provider]  ammonium lactate (AMLACTIN) 12 % lotion Apply 1 Application topically as needed for dry skin. 05/28/22   Felipa Furnace, DPM  aspirin EC 81 MG tablet Take 81 mg by mouth every morning. Swallow whole.    [provider]  fluticasone furoate-vilanterol (BREO ELLIPTA) 100-25 MCG/ACT AEPB Inhale 1 puff into the lungs daily. 04/26/22   Oswald Hillock, MD  LINZESS 145 MCG CAPS capsule Take 145 mcg by mouth daily  as needed (constipation). 12/10/21   [provider]  lisinopril (ZESTRIL) 10 MG tablet Take 1 tablet (10 mg total) by mouth daily. 05/05/22 08/03/22  Rai, Ripudeep Raliegh Ip, MD  METHADONE HCL PO Take 90 mg by mouth daily. Verified dose with [place] as 90 mg daily- take home last dose 04-30-22    [provider]  methocarbamol (ROBAXIN) 500 MG tablet Take 1 tablet (500 mg total) by mouth every 8 (eight) hours as needed for muscle spasms. Patient not taking: Reported on 05/02/2022 04/28/22   Mesner, Corene Cornea, MD  Multiple Vitamin (MULTIVITAMIN WITH MINERALS) TABS tablet Take 1 tablet by mouth every morning. Centrum    [provider]  PARoxetine (PAXIL) 40 MG tablet Take 40 mg by mouth daily.    [provider]      Allergies    Patient has no known allergies.    Review of Systems   Review of Systems  Constitutional:  Positive for fatigue.  Respiratory:  Positive for cough and shortness of breath.   Cardiovascular:  Positive for chest pain.  Gastrointestinal:  Positive for nausea. Negative for abdominal pain and vomiting.  Neurological:  Positive for weakness.    Physical Exam Updated Vital Signs BP 104/60   Pulse (!) 55   Temp 98.6 F (37 C)   Resp 18   Ht '6\' 2"'$  (1.88 m)   Wt 100.7 kg   SpO2 91%  BMI 28.50 kg/m  Physical Exam Vitals and nursing note reviewed.  Constitutional:      General: He is not in acute distress.    Appearance: Normal appearance. He is well-developed.  HENT:     Head: Normocephalic and atraumatic.  Eyes:     Conjunctiva/sclera: Conjunctivae normal.  Cardiovascular:     Rate and Rhythm: Regular rhythm. Bradycardia present.     Heart sounds: No murmur heard. Pulmonary:     Effort: Pulmonary effort is normal. No respiratory distress.     Breath sounds: Normal breath sounds.  Abdominal:     Palpations: Abdomen is soft.     Tenderness: There is no abdominal tenderness. There is no guarding or rebound.  Musculoskeletal:         General: No deformity.     Cervical back: Neck supple.     Right lower leg: No edema.     Left lower leg: No edema.  Skin:    General: Skin is warm and dry.     Capillary Refill: Capillary refill takes less than 2 seconds.  Neurological:     General: No focal deficit present.     Mental Status: He is alert.     ED Results / Procedures / Treatments   Labs (all labs ordered are listed, but only abnormal results are displayed) Labs Reviewed  CBC WITH DIFFERENTIAL/PLATELET - Abnormal; Notable for the following components:      Result Value   RBC 3.91 (*)    Hemoglobin 12.0 (*)    HCT 37.5 (*)    All other components within normal limits  COMPREHENSIVE METABOLIC PANEL - Abnormal; Notable for the following components:   Creatinine, Ser 1.62 (*)    GFR, Estimated 45 (*)    All other components within normal limits  CBC - Abnormal; Notable for the following components:   RBC 3.42 (*)    Hemoglobin 10.6 (*)    HCT 33.0 (*)    All other components within normal limits  CREATININE, SERUM - Abnormal; Notable for the following components:   Creatinine, Ser 1.58 (*)    GFR, Estimated 47 (*)    All other components within normal limits  CBC WITH DIFFERENTIAL/PLATELET - Abnormal; Notable for the following components:   RBC 3.35 (*)    Hemoglobin 10.6 (*)    HCT 32.0 (*)    All other components within normal limits  BASIC METABOLIC PANEL - Abnormal; Notable for the following components:   Glucose, Bld 116 (*)    Creatinine, Ser 1.63 (*)    GFR, Estimated 45 (*)    All other components within normal limits  RESP PANEL BY RT-PCR (RSV, FLU A&B, COVID)  RVPGX2  CULTURE, BLOOD (ROUTINE X 2)  CULTURE, BLOOD (ROUTINE X 2)  BRAIN NATRIURETIC PEPTIDE  HIV ANTIBODY (ROUTINE TESTING W REFLEX)  LEGIONELLA PNEUMOPHILA SEROGP 1 UR AG  STREP PNEUMONIAE URINARY ANTIGEN  PROCALCITONIN  TROPONIN I (HIGH SENSITIVITY)  TROPONIN I (HIGH SENSITIVITY)    EKG EKG  Interpretation  Date/Time:  Friday May 31 2022 15:05:12 EST Ventricular Rate:  60 PR Interval:  166 QRS Duration: 102 QT Interval:  422 QTC Calculation: 422 R Axis:   -3 Text Interpretation: Normal sinus rhythm T wave abnormality, consider lateral ischemia Abnormal ECG When compared with ECG of 01-May-2022 15:23, anterior and lateral t wave inversions new Confirmed by Aletta Edouard [phone]) on 05/31/2022 6:36:48 PM  Radiology DG Chest 2 View  Result Date: 05/31/2022 CLINICAL DATA:  Cough.  Chest pain. EXAM: CHEST - 2 VIEW COMPARISON:  May 01, 2022. FINDINGS: The heart size and mediastinal contours are within normal limits. Right lung is clear. Mild left lingular opacity is noted concerning for pneumonia or atelectasis. The visualized skeletal structures are unremarkable. IMPRESSION: Mild left lingular opacity concerning for pneumonia or atelectasis. Followup PA and lateral chest X-ray is recommended in 3-4 weeks following trial of antibiotic therapy to ensure resolution and exclude underlying malignancy. Electronically Signed   By: Marijo Conception M.D.   On: 05/31/2022 16:02    Procedures Procedures    Medications Ordered in ED Medications  sodium chloride 0.9 % bolus 500 mL (has no administration in time range)  cefTRIAXone (ROCEPHIN) 1 g in sodium chloride 0.9 % 100 mL IVPB (has no administration in time range)  azithromycin (ZITHROMAX) 500 mg in sodium chloride 0.9 % 250 mL IVPB (has no administration in time range)  ondansetron (ZOFRAN-ODT) disintegrating tablet 4 mg (4 mg Oral Given 05/31/22 1526)    ED Course/ Medical Decision Making/ A&P Clinical Course as of 06/01/22 2620  Fri May 31, 2022  2142 Discussed with Dr. Claria Dice [facility] hospitalist to evaluate patient for admission.  Reviewed results with patient and he is agreeable to plan for admission. [MB]    Clinical Course User Index [MB] Hayden Rasmussen, MD                           Medical Decision  Making Amount and/or Complexity of Data Reviewed Labs: ordered. Radiology: ordered.  Risk OTC drugs. Prescription drug management. Decision regarding hospitalization.   This patient complains of weakness shortness of breath fever nausea vomiting; this involves an extensive number of treatment Options and is a complaint that carries with it a high risk of complications and morbidity. The differential includes pneumonia, COPD, anemia, metabolic derangement, COVID, flu  I ordered, reviewed and interpreted labs, which included CBC with normal white count, hemoglobin low stable from priors, chemistries with mild AKI, troponins flat, blood culture sent, BNP not elevated, COVID and flu negative I ordered medication IV fluids IV antibiotics and reviewed PMP when indicated. I ordered imaging studies which included chest x-ray and CT chest and I independently    visualized and interpreted imaging which showed multifocal pneumonia Previous records obtained and reviewed in epic including recent discharge summary I consulted [facility] hospitalist Dr. Claria Dice and discussed lab and imaging findings and discussed disposition.  Cardiac monitoring reviewed, normal sinus rhythm Social determinants considered, no significant barriers Critical Interventions: None  After the interventions stated above, I reevaluated the patient and found patient be oxygenating well on room air no distress Admission and further testing considered, he would benefit from mission for IV antibiotics and continued management.  Patient in agreement with plan.         Final Clinical Impression(s) / ED Diagnoses Final diagnoses:  Multifocal pneumonia  Generalized weakness    Rx / DC Orders ED Discharge Orders     None         Hayden Rasmussen, MD 06/01/22 [phone]

## 2022-05-31 NOTE — ED Triage Notes (Signed)
Pt BIB [facility] from a hotel c/o thinking he is getting pneumonia again. Pt states he thinks that because he feels horrible and weak.

## 2022-05-31 NOTE — ED Triage Notes (Signed)
The pt  is c/o some chest pain diff brearhing none now  he just feels bad  h had a recent   bout of pneumonia unknown temp

## 2022-06-01 DIAGNOSIS — J189 Pneumonia, unspecified organism: Secondary | ICD-10-CM | POA: Diagnosis not present

## 2022-06-01 LAB — CBC WITH DIFFERENTIAL/PLATELET
Abs Immature Granulocytes: 0.02 10*3/uL (ref 0.00–0.07)
Basophils Absolute: 0 10*3/uL (ref 0.0–0.1)
Basophils Relative: 1 %
Eosinophils Absolute: 0.2 10*3/uL (ref 0.0–0.5)
Eosinophils Relative: 5 %
HCT: 32 % — ABNORMAL LOW (ref 39.0–52.0)
Hemoglobin: 10.6 g/dL — ABNORMAL LOW (ref 13.0–17.0)
Immature Granulocytes: 1 %
Lymphocytes Relative: 31 %
Lymphs Abs: 1.3 10*3/uL (ref 0.7–4.0)
MCH: 31.6 pg (ref 26.0–34.0)
MCHC: 33.1 g/dL (ref 30.0–36.0)
MCV: 95.5 fL (ref 80.0–100.0)
Monocytes Absolute: 0.5 10*3/uL (ref 0.1–1.0)
Monocytes Relative: 11 %
Neutro Abs: 2.1 10*3/uL (ref 1.7–7.7)
Neutrophils Relative %: 51 %
Platelets: 221 10*3/uL (ref 150–400)
RBC: 3.35 MIL/uL — ABNORMAL LOW (ref 4.22–5.81)
RDW: 12.8 % (ref 11.5–15.5)
WBC: 4.1 10*3/uL (ref 4.0–10.5)
nRBC: 0 % (ref 0.0–0.2)

## 2022-06-01 LAB — HIV ANTIBODY (ROUTINE TESTING W REFLEX): HIV Screen 4th Generation wRfx: NONREACTIVE

## 2022-06-01 LAB — BASIC METABOLIC PANEL
Anion gap: 8 (ref 5–15)
BUN: 21 mg/dL (ref 8–23)
CO2: 27 mmol/L (ref 22–32)
Calcium: 9 mg/dL (ref 8.9–10.3)
Chloride: 102 mmol/L (ref 98–111)
Creatinine, Ser: 1.63 mg/dL — ABNORMAL HIGH (ref 0.61–1.24)
GFR, Estimated: 45 mL/min — ABNORMAL LOW (ref >60)
Glucose, Bld: 116 mg/dL — ABNORMAL HIGH (ref 70–99)
Potassium: 4.6 mmol/L (ref 3.5–5.1)
Sodium: 137 mmol/L (ref 135–145)

## 2022-06-01 LAB — PROCALCITONIN: Procalcitonin: <0.1 ng/mL

## 2022-06-01 LAB — STREP PNEUMONIAE URINARY ANTIGEN: Strep Pneumo Urinary Antigen: NEGATIVE

## 2022-06-01 MED ORDER — LACTATED RINGERS IV SOLN: INTRAVENOUS | Status: AC

## 2022-06-01 MED ORDER — ENOXAPARIN SODIUM 40 MG/0.4ML IJ SOSY
40.0000 mg | PREFILLED_SYRINGE | INTRAMUSCULAR | Status: DC
  Administered 2022-06-02 – 2022-06-04 (×3): 40 mg via SUBCUTANEOUS
  Filled 2022-06-01 (×3): qty 0.4

## 2022-06-01 NOTE — ED Notes (Signed)
ED TO INPATIENT HANDOFF REPORT  ED Nurse Name and Phone #: Vamsi Apfel   S Name/Age/Gender Jonathan Wilson 71 y.o. male Room/Bed: 006C/006C  Code Status   Code Status: Full Code  Home/SNF/Other Home Patient oriented to: self, place, time, and situation Is this baseline? Yes   Triage Complete: Triage complete  Chief Complaint Multifocal pneumonia [J18.9]  Triage Note Pt BIB [facility] from a hotel c/o thinking he is getting pneumonia again. Pt states he thinks that because he feels horrible and weak.   The pt  is c/o some chest pain diff brearhing none now  he just feels bad  h had a recent   bout of pneumonia unknown temp   Allergies No Known Allergies  Level of Care/Admitting Diagnosis ED Disposition     ED Disposition  Admit   Condition  --   [place]: [place] [[id number]]  Level of Care: Med-Surg [16]  May admit patient to [facility] or [facility] if equivalent level of care is available:: Yes  Covid Evaluation: Asymptomatic - no recent exposure (last 10 days) testing not required  Diagnosis: Multifocal pneumonia [7169678]  Admitting Physician: [place], [place]  Attending Physician: Quintella Baton [[id number]]  Certification:: I certify this patient will need inpatient services for at least 2 midnights  Estimated Length of Stay: 2          B Medical/Surgery History Past Medical History:  Diagnosis Date   Anxiety    Anxiety and depression 18-Aug-1998   worse after death of wife 2013-03-19   Depression    Diverticulosis of colon 17-Aug-2009   seen on CT scan in 08-17-2009.    Gout    Hepatitis C before 08/18/98   Hyperlipidemia    Hypertension    Influenza A 06/2013   acute resp failure, did not require intubation.   Obesity    300 # in 17-Aug-2008, BMI 34 in 04/2013.    Pneumonia    recurrent episodes    Psychosis ([place]) August 18, 1998   s/e of treatment for Hepatitis C.     Renal cancer ([place])    Right lower lobe lung mass 05/2008   resolved on follow up CT 2010-08-18.     Vitamin D deficiency    Past Surgical History:  Procedure Laterality Date   CHOLECYSTECTOMY N/A 01/14/2014   Procedure: LAPAROSCOPIC CHOLECYSTECTOMY WITH INTRAOPERATIVE CHOLANGIOGRAM;  Surgeon: Shann Medal, MD;  Location: [facility];  Service: General;  Laterality: N/A;   MANDIBLE FRACTURE SURGERY     ROBOTIC ASSITED PARTIAL NEPHRECTOMY Right 08/07/2015   Procedure: XI ROBOTIC ASSISTED RIGHT PARTIAL NEPHRECTOMY;  Surgeon: Cleon Gustin, MD;  Location: [facility];  Service: Urology;  Laterality: Right;     A IV Location/Drains/Wounds Patient Lines/Drains/Airways Status     Active Line/Drains/Airways     Name Placement date Placement time Site Days   Peripheral IV 05/31/22 20 G Distal;Posterior;Right Forearm 05/31/22  1934  Forearm  1   Peripheral IV 05/31/22 20 G Posterior;Right Hand 05/31/22  1934  Hand  1            Intake/Output Last 24 hours  Intake/Output Summary (Last 24 hours) at 06/01/2022 0008 Last data filed at 05/31/2022 2047-08-18 Gross per 24 hour  Intake 850 ml  Output --  Net 850 ml    Labs/Imaging Results for orders placed or performed during the hospital encounter of 05/31/22 (from the past 48 hour(s))  CBC with Differential     Status:  Abnormal   Collection Time: 05/31/22  3:22 PM  Result Value Ref Range   WBC 5.3 4.0 - 10.5 K/uL   RBC 3.91 (L) 4.22 - 5.81 MIL/uL   Hemoglobin 12.0 (L) 13.0 - 17.0 g/dL   HCT 37.5 (L) 39.0 - 52.0 %   MCV 95.9 80.0 - 100.0 fL   MCH 30.7 26.0 - 34.0 pg   MCHC 32.0 30.0 - 36.0 g/dL   RDW 12.8 11.5 - 15.5 %   Platelets 300 150 - 400 K/uL   nRBC 0.0 0.0 - 0.2 %   Neutrophils Relative % 65 %   Neutro Abs 3.4 1.7 - 7.7 K/uL   Lymphocytes Relative 21 %   Lymphs Abs 1.1 0.7 - 4.0 K/uL   Monocytes Relative 10 %   Monocytes Absolute 0.5 0.1 - 1.0 K/uL   Eosinophils Relative 3 %   Eosinophils Absolute 0.2 0.0 - 0.5 K/uL   Basophils Relative 1 %   Basophils Absolute 0.0 0.0 - 0.1 K/uL   Immature Granulocytes 0 %   Abs  Immature Granulocytes 0.01 0.00 - 0.07 K/uL    Comment: Performed at [facility], [address], [place], [place] [address]  Comprehensive metabolic panel     Status: Abnormal   Collection Time: 05/31/22  3:22 PM  Result Value Ref Range   Sodium 139 135 - 145 mmol/L   Potassium 5.0 3.5 - 5.1 mmol/L   Chloride 104 98 - 111 mmol/L   CO2 27 22 - 32 mmol/L   Glucose, Bld 79 70 - 99 mg/dL    Comment: Glucose reference range applies only to samples taken after fasting for at least 8 hours.   BUN 21 8 - 23 mg/dL   Creatinine, Ser 1.62 (H) 0.61 - 1.24 mg/dL   Calcium 9.3 8.9 - 10.3 mg/dL   Total Protein 7.1 6.5 - 8.1 g/dL   Albumin 3.7 3.5 - 5.0 g/dL   AST 15 15 - 41 U/L   ALT 10 0 - 44 U/L   Alkaline Phosphatase 61 38 - 126 U/L   Total Bilirubin 0.8 0.3 - 1.2 mg/dL   GFR, Estimated 45 (L) >60 mL/min    Comment: (NOTE) Calculated using the CKD-EPI Creatinine Equation (2021)    Anion gap 8 5 - 15    Comment: Performed at [facility] [address] [place], [place] [address]  Troponin I (High Sensitivity)     Status: None   Collection Time: 05/31/22  3:22 PM  Result Value Ref Range   Troponin I (High Sensitivity) 7 <18 ng/L    Comment: (NOTE) Elevated high sensitivity troponin I (hsTnI) values and significant  changes across serial measurements may suggest ACS but many other  chronic and acute conditions are known to elevate hsTnI results.  Refer to the "Links" section for chest pain algorithms and additional  guidance. Performed at [facility], [place] [address], [place], [place] [address]   Resp panel by RT-PCR (RSV, Flu A&B, Covid) Anterior Nasal Swab     Status: None   Collection Time: 05/31/22  3:29 PM   Specimen: Anterior Nasal Swab  Result Value Ref Range   SARS Coronavirus 2 by RT PCR NEGATIVE NEGATIVE    Comment: (NOTE) SARS-CoV-2 target nucleic acids are NOT DETECTED.  The SARS-CoV-2 RNA is generally detectable in upper respiratory specimens during  the acute phase of infection. The lowest concentration of SARS-CoV-2 viral copies this assay can detect is 138  copies/mL. A negative result does not preclude SARS-Cov-2 infection and should not be used as the sole basis for treatment or other patient management decisions. A negative result may occur with  improper specimen collection/handling, submission of specimen other than nasopharyngeal swab, presence of viral mutation(s) within the areas targeted by this assay, and inadequate number of viral copies(<138 copies/mL). A negative result must be combined with clinical observations, patient history, and epidemiological information. The expected result is Negative.  Fact Sheet for Patients:  EntrepreneurPulse.com.au  Fact Sheet for Healthcare Providers:  IncredibleEmployment.be  This test is no t yet approved or cleared by the [place] FDA and  has been authorized for detection and/or diagnosis of SARS-CoV-2 by FDA under an Emergency Use Authorization (EUA). This EUA will remain  in effect (meaning this test can be used) for the duration of the COVID-19 declaration under Section 564(b)(1) of the Act, 21 U.S.C.section 360bbb-3(b)(1), unless the authorization is terminated  or revoked sooner.       Influenza A by PCR NEGATIVE NEGATIVE   Influenza B by PCR NEGATIVE NEGATIVE    Comment: (NOTE) The Xpert Xpress SARS-CoV-2/FLU/RSV plus assay is intended as an aid in the diagnosis of influenza from Nasopharyngeal swab specimens and should not be used as a sole basis for treatment. Nasal washings and aspirates are unacceptable for Xpert Xpress SARS-CoV-2/FLU/RSV testing.  Fact Sheet for Patients: EntrepreneurPulse.com.au  Fact Sheet for Healthcare Providers: IncredibleEmployment.be  This test is not yet approved or cleared by the [place] FDA and has been authorized for detection and/or diagnosis of  SARS-CoV-2 by FDA under an Emergency Use Authorization (EUA). This EUA will remain in effect (meaning this test can be used) for the duration of the COVID-19 declaration under Section 564(b)(1) of the Act, 21 U.S.C. section 360bbb-3(b)(1), unless the authorization is terminated or revoked.     Resp Syncytial Virus by PCR NEGATIVE NEGATIVE    Comment: (NOTE) Fact Sheet for Patients: EntrepreneurPulse.com.au  Fact Sheet for Healthcare Providers: IncredibleEmployment.be  This test is not yet approved or cleared by the [place] FDA and has been authorized for detection and/or diagnosis of SARS-CoV-2 by FDA under an Emergency Use Authorization (EUA). This EUA will remain in effect (meaning this test can be used) for the duration of the COVID-19 declaration under Section 564(b)(1) of the Act, 21 U.S.C. section 360bbb-3(b)(1), unless the authorization is terminated or revoked.  Performed at [facility], [place] [address] [place], [place] [address]   Troponin I (High Sensitivity)     Status: None   Collection Time: 05/31/22  7:36 PM  Result Value Ref Range   Troponin I (High Sensitivity) 8 <18 ng/L    Comment: (NOTE) Elevated high sensitivity troponin I (hsTnI) values and significant  changes across serial measurements may suggest ACS but many other  chronic and acute conditions are known to elevate hsTnI results.  Refer to the "Links" section for chest pain algorithms and additional  guidance. Performed at [facility], [place] [address], [place], [place] [address]   Brain natriuretic peptide     Status: None   Collection Time: 05/31/22  7:36 PM  Result Value Ref Range   B Natriuretic Peptide 63.3 0.0 - 100.0 pg/mL    Comment: Performed at [facility] [address] [place],  [address]  HIV Antibody (routine testing w rflx)     Status: None   Collection Time: 05/31/22 10:38 PM  Result Value Ref Range  HIV Screen  4th Generation wRfx Non Reactive Non Reactive    Comment: Performed at [facility], [place] [address], [place], [place] [address]  CBC     Status: Abnormal   Collection Time: 05/31/22 10:38 PM  Result Value Ref Range   WBC 4.7 4.0 - 10.5 K/uL   RBC 3.42 (L) 4.22 - 5.81 MIL/uL   Hemoglobin 10.6 (L) 13.0 - 17.0 g/dL   HCT 33.0 (L) 39.0 - 52.0 %   MCV 96.5 80.0 - 100.0 fL   MCH 31.0 26.0 - 34.0 pg   MCHC 32.1 30.0 - 36.0 g/dL   RDW 12.8 11.5 - 15.5 %   Platelets 235 150 - 400 K/uL   nRBC 0.0 0.0 - 0.2 %    Comment: Performed at [facility], [place] [address] [place], [place] [address]  Creatinine, serum     Status: Abnormal   Collection Time: 05/31/22 10:38 PM  Result Value Ref Range   Creatinine, Ser 1.58 (H) 0.61 - 1.24 mg/dL   GFR, Estimated 47 (L) >60 mL/min    Comment: (NOTE) Calculated using the CKD-EPI Creatinine Equation (2021) Performed at [facility] [address] [place], [place] [address]    CT Chest W Contrast  Result Date: 05/31/2022 CLINICAL DATA:  Pneumonia, suspected complications EXAM: CT CHEST WITH CONTRAST TECHNIQUE: Multidetector CT imaging of the chest was performed during intravenous contrast administration. RADIATION DOSE REDUCTION: This exam was performed according to the departmental dose-optimization program which includes automated exposure control, adjustment of the mA and/or kV according to patient size and/or use of iterative reconstruction technique. CONTRAST:  40m OMNIPAQUE IOHEXOL 350 MG/ML SOLN COMPARISON:  Chest radiographs done earlier today and chest radiographs done on 05/01/2022 TD FINDINGS: Cardiovascular: Coronary artery calcifications are seen. There are scattered calcifications in thoracic aorta and its major branches. There is ectasia of main pulmonary artery measuring 3.5 cm suggesting possible pulmonary arterial hypertension. There are no filling defects in central pulmonary arteries. Contrast density in the peripheral  pulmonary artery branches is less than adequate to evaluate the lumen. Mediastinum/Nodes: There are slightly enlarged lymph nodes in mediastinum, possibly suggesting reactive hyperplasia. Lungs/Pleura: There are new patchy infiltrates seen lingula and both lower lobes, more so in the left lower lobe suggesting multifocal pneumonia. There is no pleural effusion or pneumothorax. Centrilobular emphysema is seen. There is mild peribronchial thickening. There is no pleural effusion or pneumothorax. Upper Abdomen: Surgical clips are seen in gallbladder fossa. Musculoskeletal: No acute findings are seen. IMPRESSION: There is interval appearance of patchy alveolar infiltrates in lingula and both lower lobes, more so in the left lower lobe suggesting multifocal pneumonia. Follow-up studies in 3-4 weeks following trial of antibiotic therapy may be considered to assess resolution. There is no pleural effusion. There is ectasia of main pulmonary artery suggesting pulmonary arterial hypertension. There are no intraluminal filling defects seen central pulmonary artery branches. Evaluation of peripheral pulmonary artery branches is limited due to less than optimal contrast enhancement in this routine CT chest. If there is clinical suspicion for pulmonary embolism, follow-up CT angiogram should be considered. Coronary artery calcifications are seen.  Aortic atherosclerosis. Electronically Signed   By: PElmer PickerM.D.   On: 05/31/2022 21:03   DG Chest 2 View  Result Date: 05/31/2022 CLINICAL DATA:  Cough.  Chest pain. EXAM: CHEST - 2 VIEW COMPARISON:  May 01, 2022. FINDINGS: The heart size and mediastinal contours are within normal limits. Right lung is clear.  Mild left lingular opacity is noted concerning for pneumonia or atelectasis. The visualized skeletal structures are unremarkable. IMPRESSION: Mild left lingular opacity concerning for pneumonia or atelectasis. Followup PA and lateral chest X-ray is  recommended in 3-4 weeks following trial of antibiotic therapy to ensure resolution and exclude underlying malignancy. Electronically Signed   By: Marijo Conception M.D.   On: 05/31/2022 16:02    Pending Labs Unresulted Labs (From admission, onward)     Start     Ordered   06/07/22 0500  Creatinine, serum  (enoxaparin (LOVENOX)    CrCl < 30 ml/min)  Once,   R       Comments: while on enoxaparin therapy.    05/31/22 2209   06/01/22 0500  CBC with Differential/Platelet  Tomorrow morning,   R        05/31/22 2209   06/01/22 9562  Basic metabolic panel  Tomorrow morning,   R        05/31/22 2209   05/31/22 2208  Legionella Pneumophila Serogp 1 Ur Ag  Once,   R        05/31/22 2209   05/31/22 2208  Strep pneumoniae urinary antigen  Once,   R        05/31/22 2209   05/31/22 1900  Culture, blood (routine x 2)  BLOOD CULTURE X 2,   R      05/31/22 1901            Vitals/Pain Today's Vitals   05/31/22 2020 05/31/22 2100 05/31/22 2115 05/31/22 2315  BP:  109/67 115/63 129/69  Pulse: (!) 59 (!) 59 (!) 50 (!) 56  Resp: '15 14 13 16  '$ Temp:      TempSrc:      SpO2: 93% 97% 98% 95%  Weight:      Height:      PainSc:        Isolation Precautions No active isolations  Medications Medications  enoxaparin (LOVENOX) injection 30 mg (has no administration in time range)  cefTRIAXone (ROCEPHIN) 2 g in sodium chloride 0.9 % 100 mL IVPB (has no administration in time range)  azithromycin (ZITHROMAX) 500 mg in sodium chloride 0.9 % 250 mL IVPB (has no administration in time range)  ipratropium-albuterol (DUONEB) 0.5-2.5 (3) MG/3ML nebulizer solution 3 mL (has no administration in time range)  amLODipine (NORVASC) tablet 5 mg (has no administration in time range)  lisinopril (ZESTRIL) tablet 10 mg (has no administration in time range)  PARoxetine (PAXIL) tablet 40 mg (has no administration in time range)  aspirin EC tablet 81 mg (has no administration in time range)  multivitamin with  minerals tablet 1 tablet (has no administration in time range)  methadone (DOLOPHINE) tablet 90 mg (has no administration in time range)  fluticasone furoate-vilanterol (BREO ELLIPTA) 100-25 MCG/ACT 1 puff (has no administration in time range)  ondansetron (ZOFRAN-ODT) disintegrating tablet 4 mg (4 mg Oral Given 05/31/22 1526)  sodium chloride 0.9 % bolus 500 mL (0 mLs Intravenous Stopped 05/31/22 2019)  cefTRIAXone (ROCEPHIN) 1 g in sodium chloride 0.9 % 100 mL IVPB (0 g Intravenous Stopped 05/31/22 2010)  azithromycin (ZITHROMAX) 500 mg in sodium chloride 0.9 % 250 mL IVPB (0 mg Intravenous Stopped 05/31/22 2049)  acetaminophen (TYLENOL) tablet 650 mg (650 mg Oral Given 05/31/22 2018)  iohexol (OMNIPAQUE) 350 MG/ML injection 50 mL (50 mLs Intravenous Contrast Given 05/31/22 2048)    Mobility walks with person assist Low fall risk   Focused Assessments Pulmonary Assessment  Handoff:  Lung sounds: Bilateral Breath Sounds: Expiratory wheezes O2 Device: 2L       R Recommendations: See Admitting Provider Note  Report given to:   Additional Notes:

## 2022-06-01 NOTE — Progress Notes (Signed)
SATURATION QUALIFICATIONS: (This note is used to comply with regulatory documentation for home oxygen)  Patient Saturations on Room Air at Rest = 98%  Patient Saturations on Room Air while Ambulating = 92%

## 2022-06-01 NOTE — Progress Notes (Signed)
PROGRESS NOTE  WOODFIN KISS  DOB: 05-10-1951  PCP: Vicenta Aly, [place] NOM:[id number]  DOA: 05/31/2022  LOS: 1 day  Hospital Day: 2  Brief narrative: RIGDON MACOMBER is a 71 y.o. male with PMH significant for HTN, HLD, CAD, infrarenal AAA, diverticulosis, anxiety/depression, renal cell cancer, chronic hep C methadone dependency. Patient has had multiple recent admissions 11/20-24 and 11/29-12/1.  His last admission was for influenza.  Per patient he believes he was discharged too soon both times.  He was discharged on oxygen which he eventually weaned himself from.  He states he has not really felt well since discharge, however, on Tuesday 12/26 he felt the best he had felt in 2 weeks.   Next day, he started getting sick again.  He developed nausea, vomiting, terrible headache, a cough that was occasionally productive, wheezing, pleuritic type chest pain, lightheadedness, dizziness, poor oral intake generalized weakness, and declining baseline exercise capacity.  He also had fever and chills.   In the ED, patient had no fever, heart rate in 50s, blood pressure in low 100s, breathing on room air Labs showed WC count normal at 5.3, hemoglobin 12, platelet 300, BUN/creatinine 21/1.62 Troponin and BNP normal Respiratory virus panel negative for influenza, COVID CT chest showed multifocal pneumonia with infiltrates in lingula as well as both lower lobes.  There also was evidence of pulmonary artery hypertension Blood culture sent.  Started on antibiotics Admitted to [facility]  Subjective: Patient was seen and examined this afternoon.  Sitting up at the edge of the bed.  Not in distress.  Coughs on deep breathing.  Felt short of breath on ambulation earlier, despite O2 sat at 92% on room air.  Does not feel comfortable going home as this is his third admission in 6 weeks Chart reviewed. WBC count normal.  Creatinine stable  Assessment and plan: Multifocal pneumonia Acute exacerbation of  COPD Recent hospitalizations for flu Presented with worsening respiratory symptoms Respite virus panel negative this time but CT chest showed multifocal infiltrates WC count normal.  Will obtain procalcitonin level Continue IV Rocephin and IV azithromycin for now. Follow-up blood culture report. PTA on Breo Ellipta, albuterol inhaler Continue both, DuoNeb as needed Currently on room air at rest.  Check ambulatory oxygen requirement Recent Labs  Lab 05/31/22 1522 05/31/22 2238 06/01/22 0324 06/01/22 0325  WBC 5.3 4.7  --  4.1  PROCALCITON  --   --  <0.10  --    CKD 3B It seems patient's baseline creatinine is close to 1.5.  Mildly elevated.  Continue to monitor on IV fluid Recent Labs    08/01/21 0259 04/22/22 2047 04/23/22 0045 04/24/22 0358 04/25/22 0144 04/26/22 0220 05/01/22 1535 05/02/22 0500 05/31/22 1522 05/31/22 2238 06/01/22 0325  BUN 28* 28* 32* 31* 29* 28* 25* 16 21  --  21  CREATININE 1.44* 2.73* 2.90* 1.66* 1.58* 1.32* 1.52* 1.19 1.62* 1.58* 1.63*   Essential hypertension PTA on Norvasc 5 mg daily, lisinopril 10 mg daily. Continue Norvasc.  Lisinopril on hold for now.  CAD Continue aspirin.??  Not on statin  Anxiety and depression Continue Paxil.   Chronic methadone dependency Continue methadone.  Chronic Hepatitis C Liver enzymes normal  Constipation Linzess as needed  Goals of care   Code Status: Full Code    Mobility: Encourage ambulation  Scheduled Meds:  amLODipine  5 mg Oral Daily   aspirin EC  81 mg Oral q morning   [START ON 06/02/2022] enoxaparin (LOVENOX) injection  40 mg  Subcutaneous Q24H   fluticasone furoate-vilanterol  1 puff Inhalation Daily   methadone  90 mg Oral Daily   multivitamin with minerals  1 tablet Oral q morning   PARoxetine  40 mg Oral Daily    PRN meds: ipratropium-albuterol   Infusions:   azithromycin     cefTRIAXone (ROCEPHIN)  IV     lactated ringers 50 mL/hr at 06/01/22 3893    Skin  assessment:     Nutritional status:  Body mass index is 28.5 kg/m.          Diet:  Diet Order             Diet Heart Room service appropriate? Yes; Fluid consistency: Thin  Diet effective now                   DVT prophylaxis:  enoxaparin (LOVENOX) injection 40 mg Start: 06/02/22 1000 SCDs Start: 05/31/22 2207   Antimicrobials: IV Rocephin, IV azithromycin Fluid: LR at 50 mill per hour Consultants: None Family Communication: None at bedside  Status is: Inpatient  Continue in-hospital care because: Needs antibiotics Level of care: Med-Surg   Dispo: The patient is from: Home              Anticipated d/c is to: Hopefully home              Patient currently is not medically stable to d/c.   Difficult to place patient No    Antimicrobials: Anti-infectives (From admission, onward)    Start     Dose/Rate Route Frequency Ordered Stop   06/01/22 1600  cefTRIAXone (ROCEPHIN) 2 g in sodium chloride 0.9 % 100 mL IVPB        2 g 200 mL/hr over 30 Minutes Intravenous Every 24 hours 05/31/22 2239     06/01/22 1600  azithromycin (ZITHROMAX) 500 mg in sodium chloride 0.9 % 250 mL IVPB        500 mg 250 mL/hr over 60 Minutes Intravenous Every 24 hours 05/31/22 2239     05/31/22 1915  cefTRIAXone (ROCEPHIN) 1 g in sodium chloride 0.9 % 100 mL IVPB        1 g 200 mL/hr over 30 Minutes Intravenous  Once 05/31/22 1901 05/31/22 2010   05/31/22 1915  azithromycin (ZITHROMAX) 500 mg in sodium chloride 0.9 % 250 mL IVPB        500 mg 250 mL/hr over 60 Minutes Intravenous  Once 05/31/22 1901 05/31/22 2049       Objective: Vitals:   06/01/22 0136 06/01/22 0813  BP: (!) 134/116 (!) 96/50  Pulse: 64 (!) 51  Resp: 17 16  Temp: 98.5 F (36.9 C) 97.7 F (36.5 C)  SpO2: 92% 94%    Intake/Output Summary (Last 24 hours) at 06/01/2022 1548 Last data filed at 05/31/2022 2049 Gross per 24 hour  Intake 850 ml  Output --  Net 850 ml   Filed Weights   05/31/22 1507   Weight: 100.7 kg   Weight change:  Body mass index is 28.5 kg/m.   Physical Exam: General exam: Pleasant, elderly Caucasian male. Skin: No rashes, lesions or ulcers. HEENT: Atraumatic, normocephalic, no obvious bleeding Lungs: Diminished air entry both bases.  Coughs on deep breathing CVS: Regular rate and rhythm, no murmur GI/Abd soft, nontender, nondistended, bowel sound present CNS: Alert, awake, oriented x 3 Psychiatry: Mood appropriate Extremities: No pedal edema, no calf tenderness  Data Review: I have personally reviewed the laboratory data and studies available.  F/u labs ordered Unresulted Labs (From admission, onward)     Start     Ordered   06/07/22 0500  Creatinine, serum  (enoxaparin (LOVENOX)    CrCl < 30 ml/min)  Once,   R       Comments: while on enoxaparin therapy.    05/31/22 2209   05/31/22 2208  Legionella Pneumophila Serogp 1 Ur Ag  Once,   R        05/31/22 2209            Total time spent in review of labs and imaging, patient evaluation, formulation of plan, documentation and communication with family 1 minutes  Signed, Terrilee Croak, MD [facility] 06/01/2022

## 2022-06-02 DIAGNOSIS — J189 Pneumonia, unspecified organism: Secondary | ICD-10-CM | POA: Diagnosis not present

## 2022-06-02 MED ORDER — ACETAMINOPHEN 325 MG PO TABS
650.0000 mg | ORAL_TABLET | Freq: Four times a day (QID) | ORAL | Status: DC | PRN
  Administered 2022-06-02: 650 mg via ORAL
  Filled 2022-06-02: qty 2

## 2022-06-02 NOTE — Progress Notes (Signed)
PROGRESS NOTE  ANCHOR DWAN  DOB: May 07, 1951  PCP: Vicenta Aly, [place] BSW:[id number]  DOA: 05/31/2022  LOS: 2 days  Hospital Day: 3  Brief narrative: EUGEN JEANSONNE is a 71 y.o. male with PMH significant for HTN, HLD, CAD, infrarenal AAA, diverticulosis, anxiety/depression, renal cell cancer, chronic hep C methadone dependency. Patient has had multiple recent admissions 11/20-24 and 11/29-12/1.  His last admission was for influenza.  Per patient he believes he was discharged too soon both times.  He was discharged on oxygen which he eventually weaned himself from.  He states he has not really felt well since discharge, however, on Tuesday 12/26 he felt the best he had felt in 2 weeks.   Next day, he started getting sick again.  He developed nausea, vomiting, terrible headache, a cough that was occasionally productive, wheezing, pleuritic type chest pain, lightheadedness, dizziness, poor oral intake generalized weakness, and declining baseline exercise capacity.  He also had fever and chills.   In the ED, patient had no fever, heart rate in 50s, blood pressure in low 100s, breathing on room air Labs showed WC count normal at 5.3, hemoglobin 12, platelet 300, BUN/creatinine 21/1.62 Troponin and BNP normal Respiratory virus panel negative for influenza, COVID CT chest showed multifocal pneumonia with infiltrates in lingula as well as both lower lobes.  There also was evidence of pulmonary artery hypertension Blood culture sent.  Started on antibiotics Admitted to [facility]  Subjective: Patient was seen and examined this morning. Pleasant elderly Caucasian male.  Sitting up at the edge of the bed.  Not in distress. His brother-in-law is at bedside with him for last 3 days. Patient ambulated on the hallway today, did not need supplemental oxygen and O2 sat remained over 90% throughout. Patient states that he just got evicted and has nowhere to go.  He could not leave his brother-in-law behind in  the street and hence both are in the hospital room for last 3 days.  Patient states that tomorrow he would get his Social Security check and would be able to afford somewhere to go.  He is asking me for a discharge tomorrow morning.  Assessment and plan: Multifocal pneumonia Acute exacerbation of COPD Recent hospitalizations for flu Presented with worsening respiratory symptoms Respiratory virus panel negative this time but CT chest showed multifocal infiltrates Patient was started on IV Rocephin, IV azithromycin. WBC count normal.  Procalcitonin level normal. Patient is clinically improving on IV Rocephin and azithromycin.  To complete 3 days of azithromycin today.  Will switch to oral Omnicef tomorrow Continue Breo Ellipta, albuterol inhaler. Did not require supplemental oxygen on ambulation. Recent Labs  Lab 05/31/22 1522 05/31/22 2238 06/01/22 0324 06/01/22 0325  WBC 5.3 4.7  --  4.1  PROCALCITON  --   --  <0.10  --    CKD 3B It seems patient's baseline creatinine is close to 1.5.  Mildly elevated.  Given adequate hydration. Recent Labs    08/01/21 0259 04/22/22 2047 04/23/22 0045 04/24/22 0358 04/25/22 0144 04/26/22 0220 05/01/22 1535 05/02/22 0500 05/31/22 1522 05/31/22 2238 06/01/22 0325  BUN 28* 28* 32* 31* 29* 28* 25* 16 21  --  21  CREATININE 1.44* 2.73* 2.90* 1.66* 1.58* 1.32* 1.52* 1.19 1.62* 1.58* 1.63*   Essential hypertension PTA on Norvasc 5 mg daily, lisinopril 10 mg daily. Continue Norvasc.  Lisinopril on hold for now.  CAD On aspirin.  Not on statin  Anxiety and depression Continue Paxil.   Chronic methadone dependency Continue  methadone.  Chronic Hepatitis C Liver enzymes normal  Constipation Linzess as needed  Goals of care   Code Status: Full Code    Mobility: Encourage ambulation  Scheduled Meds:  amLODipine  5 mg Oral Daily   aspirin EC  81 mg Oral q morning   enoxaparin (LOVENOX) injection  40 mg Subcutaneous Q24H    fluticasone furoate-vilanterol  1 puff Inhalation Daily   methadone  90 mg Oral Daily   multivitamin with minerals  1 tablet Oral q morning   PARoxetine  40 mg Oral Daily    PRN meds: ipratropium-albuterol   Infusions:   azithromycin Stopped (06/01/22 1720)   cefTRIAXone (ROCEPHIN)  IV Stopped (06/01/22 1706)    Skin assessment:     Nutritional status:  Body mass index is 28.5 kg/m.          Diet:  Diet Order             Diet Heart Room service appropriate? Yes; Fluid consistency: Thin  Diet effective now                   DVT prophylaxis:  enoxaparin (LOVENOX) injection 40 mg Start: 06/02/22 1000 SCDs Start: 05/31/22 2207   Antimicrobials: IV Rocephin, IV azithromycin Fluid: Not on IV fluid currently Consultants: None Family Communication: None at bedside  Status is: Inpatient  Continue in-hospital care because: IV antibiotics today.  Oral antibiotics tomorrow Level of care: Med-Surg   Dispo: The patient is from: Home              Anticipated d/c is to: Plan to discharge tomorrow              Patient currently is not medically stable to d/c.   Difficult to place patient No    Antimicrobials: Anti-infectives (From admission, onward)    Start     Dose/Rate Route Frequency Ordered Stop   06/01/22 1600  cefTRIAXone (ROCEPHIN) 2 g in sodium chloride 0.9 % 100 mL IVPB        2 g 200 mL/hr over 30 Minutes Intravenous Every 24 hours 05/31/22 2239     06/01/22 1600  azithromycin (ZITHROMAX) 500 mg in sodium chloride 0.9 % 250 mL IVPB        500 mg 250 mL/hr over 60 Minutes Intravenous Every 24 hours 05/31/22 2239     05/31/22 1915  cefTRIAXone (ROCEPHIN) 1 g in sodium chloride 0.9 % 100 mL IVPB        1 g 200 mL/hr over 30 Minutes Intravenous  Once 05/31/22 1901 05/31/22 2010   05/31/22 1915  azithromycin (ZITHROMAX) 500 mg in sodium chloride 0.9 % 250 mL IVPB        500 mg 250 mL/hr over 60 Minutes Intravenous  Once 05/31/22 1901 05/31/22 2049        Objective: Vitals:   06/02/22 0435 06/02/22 0734  BP: 127/81 (!) 142/85  Pulse: (!) 53 (!) 53  Resp: 18 18  Temp: 98.6 F (37 C) 98.5 F (36.9 C)  SpO2: 93% 94%    Intake/Output Summary (Last 24 hours) at 06/02/2022 1202 Last data filed at 06/02/2022 0400 Gross per 24 hour  Intake 1121.15 ml  Output --  Net 1121.15 ml   Filed Weights   05/31/22 1507  Weight: 100.7 kg   Weight change:  Body mass index is 28.5 kg/m.   Physical Exam: General exam: Pleasant, elderly Caucasian male. Skin: No rashes, lesions or ulcers. HEENT: Atraumatic, normocephalic, no  obvious bleeding Lungs: Diminished air entry both bases.  Coughs on deep breathing CVS: Regular rate and rhythm, no murmur GI/Abd soft, nontender, nondistended, bowel sound present CNS: Alert, awake, oriented x 3 Psychiatry: Mood appropriate Extremities: No pedal edema, no calf tenderness  Data Review: I have personally reviewed the laboratory data and studies available.  F/u labs ordered Unresulted Labs (From admission, onward)     Start     Ordered   06/07/22 0500  Creatinine, serum  (enoxaparin (LOVENOX)    CrCl < 30 ml/min)  Once,   R       Comments: while on enoxaparin therapy.    05/31/22 2209   05/31/22 2208  Legionella Pneumophila Serogp 1 Ur Ag  Once,   R        05/31/22 2209            Total time spent in review of labs and imaging, patient evaluation, formulation of plan, documentation and communication with family 56 minutes  Signed, Terrilee Croak, MD [facility] 06/02/2022

## 2022-06-03 DIAGNOSIS — J189 Pneumonia, unspecified organism: Secondary | ICD-10-CM | POA: Diagnosis not present

## 2022-06-03 LAB — LEGIONELLA PNEUMOPHILA SEROGP 1 UR AG: L. pneumophila Serogp 1 Ur Ag: NEGATIVE

## 2022-06-03 MED ORDER — CEFDINIR 300 MG PO CAPS
300.0000 mg | ORAL_CAPSULE | Freq: Two times a day (BID) | ORAL | Status: DC
  Administered 2022-06-03 – 2022-06-04 (×3): 300 mg via ORAL
  Filled 2022-06-03 (×3): qty 1

## 2022-06-03 NOTE — Plan of Care (Signed)

## 2022-06-03 NOTE — Progress Notes (Signed)
PROGRESS NOTE  Jonathan Wilson  DOB: 04-20-1951  PCP: Vicenta Aly, [place] GQB:[id number]  DOA: 05/31/2022  LOS: 3 days  Hospital Day: 4  Brief narrative: Jonathan Wilson is a 72 y.o. male with PMH significant for HTN, HLD, CAD, infrarenal AAA, diverticulosis, anxiety/depression, renal cell cancer, chronic hep C methadone dependency. Patient has had multiple recent admissions 11/20-24 and 11/29-12/1.  His last admission was for influenza.  Per patient he believes he was discharged too soon both times.  He was discharged on oxygen which he eventually weaned himself from.  He states he has not really felt well since discharge, however, on Tuesday 12/26 he felt the best he had felt in 2 weeks.   Next day, he started getting sick again.  He developed nausea, vomiting, terrible headache, a cough that was occasionally productive, wheezing, pleuritic type chest pain, lightheadedness, dizziness, poor oral intake generalized weakness, and declining baseline exercise capacity.  He also had fever and chills.   In the ED, patient had no fever, heart rate in 50s, blood pressure in low 100s, breathing on room air Labs showed WC count normal at 5.3, hemoglobin 12, platelet 300, BUN/creatinine 21/1.62 Troponin and BNP normal Respiratory virus panel negative for influenza, COVID CT chest showed multifocal pneumonia with infiltrates in lingula as well as both lower lobes.  There also was evidence of pulmonary artery hypertension Blood culture sent.  Started on antibiotics Admitted to [facility]  Subjective: Patient was seen and examined this morning. Pleasant elderly Caucasian male.  Sitting up at the edge of the bed.  Not in distress. His brother-in-law is at bedside with him for last 4 days. Patient is newly homeless.  States he has nowhere to go.  He was hoping he could get the Social Security check today but it is a holiday and he will have it till tomorrow morning.  He is asking to hold discharge till  tomorrow.  Assessment and plan: Multifocal pneumonia Acute exacerbation of COPD Recent hospitalizations for flu Presented with worsening respiratory symptoms Respiratory virus panel negative this time but CT chest showed multifocal infiltrates Patient was started on IV Rocephin, IV azithromycin. WBC count normal.  Procalcitonin level normal. Patient is clinically improving on IV Rocephin and azithromycin.  Completed course of azithromycin.  Switch to oral Omnicef today.   Continue Breo Ellipta, albuterol inhaler. Did not require supplemental oxygen on ambulation. Recent Labs  Lab 05/31/22 1522 05/31/22 2238 06/01/22 0324 06/01/22 0325  WBC 5.3 4.7  --  4.1  PROCALCITON  --   --  <0.10  --     CKD 3B It seems patient's baseline creatinine is close to 1.5.  Mildly elevated.  Given adequate hydration.  Creatinine stable. Recent Labs    08/01/21 0259 04/22/22 2047 04/23/22 0045 04/24/22 0358 04/25/22 0144 04/26/22 0220 05/01/22 1535 05/02/22 0500 05/31/22 1522 05/31/22 2238 06/01/22 0325  BUN 28* 28* 32* 31* 29* 28* 25* 16 21  --  21  CREATININE 1.44* 2.73* 2.90* 1.66* 1.58* 1.32* 1.52* 1.19 1.62* 1.58* 1.63*    Essential hypertension PTA on Norvasc 5 mg daily, lisinopril 10 mg daily. Continue Norvasc.  Lisinopril on hold for now.  CAD On aspirin.  Not on statin  Anxiety and depression Continue Paxil.   Chronic methadone dependency Continue methadone.  Chronic Hepatitis C Liver enzymes normal  Constipation Linzess as needed  Goals of care   Code Status: Full Code    Mobility: Encourage ambulation  Scheduled Meds:  amLODipine  5  mg Oral Daily   aspirin EC  81 mg Oral q morning   enoxaparin (LOVENOX) injection  40 mg Subcutaneous Q24H   fluticasone furoate-vilanterol  1 puff Inhalation Daily   methadone  90 mg Oral Daily   multivitamin with minerals  1 tablet Oral q morning   PARoxetine  40 mg Oral Daily    PRN meds: acetaminophen,  ipratropium-albuterol   Infusions:   azithromycin 500 mg (06/02/22 1735)   cefTRIAXone (ROCEPHIN)  IV Stopped (06/02/22 1656)    Skin assessment:     Nutritional status:  Body mass index is 28.5 kg/m.          Diet:  Diet Order             Diet Heart Room service appropriate? Yes; Fluid consistency: Thin  Diet effective now                   DVT prophylaxis:  enoxaparin (LOVENOX) injection 40 mg Start: 06/02/22 1000 SCDs Start: 05/31/22 2207   Antimicrobials: Oral Omnicef Fluid: Not on IV fluid currently Consultants: None Family Communication: None at bedside  Status is: Inpatient  Level of care: Med-Surg   Dispo: The patient is from: Home              Anticipated d/c is to: newly homeless.  States he has nowhere to go.  He wants to wait to speak to DSS tomorrow.              Patient currently is not medically stable to d/c.   Difficult to place patient No    Antimicrobials: Anti-infectives (From admission, onward)    Start     Dose/Rate Route Frequency Ordered Stop   06/01/22 1600  cefTRIAXone (ROCEPHIN) 2 g in sodium chloride 0.9 % 100 mL IVPB        2 g 200 mL/hr over 30 Minutes Intravenous Every 24 hours 05/31/22 2239     06/01/22 1600  azithromycin (ZITHROMAX) 500 mg in sodium chloride 0.9 % 250 mL IVPB        500 mg 250 mL/hr over 60 Minutes Intravenous Every 24 hours 05/31/22 2239     05/31/22 1915  cefTRIAXone (ROCEPHIN) 1 g in sodium chloride 0.9 % 100 mL IVPB        1 g 200 mL/hr over 30 Minutes Intravenous  Once 05/31/22 1901 05/31/22 2010   05/31/22 1915  azithromycin (ZITHROMAX) 500 mg in sodium chloride 0.9 % 250 mL IVPB        500 mg 250 mL/hr over 60 Minutes Intravenous  Once 05/31/22 1901 05/31/22 2049       Objective: Vitals:   06/03/22 0724 06/03/22 0834  BP: 128/66   Pulse: (!) 48   Resp: 16   Temp: 98.4 F (36.9 C)   SpO2: 94% 95%    Intake/Output Summary (Last 24 hours) at 06/03/2022 1013 Last data filed at  06/03/2022 0112 Gross per 24 hour  Intake 100 ml  Output --  Net 100 ml    Filed Weights   05/31/22 1507  Weight: 100.7 kg   Weight change:  Body mass index is 28.5 kg/m.   Physical Exam: General exam: Pleasant, elderly Caucasian male. Skin: No rashes, lesions or ulcers. HEENT: Atraumatic, normocephalic, no obvious bleeding Lungs: Clear to auscultation bilaterally CVS: Regular rate and rhythm, no murmur GI/Abd soft, nontender, nondistended, bowel sound present CNS: Alert, awake, oriented x 3 Psychiatry: Mood appropriate Extremities: No pedal edema, no calf  tenderness  Data Review: I have personally reviewed the laboratory data and studies available.  F/u labs ordered Unresulted Labs (From admission, onward)     Start     Ordered   06/07/22 0500  Creatinine, serum  (enoxaparin (LOVENOX)    CrCl < 30 ml/min)  Once,   R       Comments: while on enoxaparin therapy.    05/31/22 2209   05/31/22 2208  Legionella Pneumophila Serogp 1 Ur Ag  Once,   R        05/31/22 2209            Total time spent in review of labs and imaging, patient evaluation, formulation of plan, documentation and communication with family 25 minutes  Signed, Terrilee Croak, MD [facility] 06/03/2022

## 2022-06-04 ENCOUNTER — Other Ambulatory Visit (HOSPITAL_COMMUNITY): Payer: Self-pay

## 2022-06-04 DIAGNOSIS — J189 Pneumonia, unspecified organism: Secondary | ICD-10-CM | POA: Diagnosis not present

## 2022-06-04 MED ORDER — FLUTICASONE FUROATE-VILANTEROL 100-25 MCG/ACT IN AEPB
1.0000 | INHALATION_SPRAY | Freq: Every day | RESPIRATORY_TRACT | 2 refills | Status: AC
  Filled 2022-06-04: qty 60, 30d supply, fill #0

## 2022-06-04 MED ORDER — AMLODIPINE BESYLATE 5 MG PO TABS
5.0000 mg | ORAL_TABLET | Freq: Every day | ORAL | 2 refills | Status: AC
  Filled 2022-06-04: qty 30, 30d supply, fill #0

## 2022-06-04 MED ORDER — ALBUTEROL SULFATE HFA 108 (90 BASE) MCG/ACT IN AERS
2.0000 | INHALATION_SPRAY | Freq: Four times a day (QID) | RESPIRATORY_TRACT | 0 refills | Status: DC | PRN
  Filled 2022-06-04: qty 6.7, 25d supply, fill #0

## 2022-06-04 MED ORDER — PAROXETINE HCL 40 MG PO TABS
40.0000 mg | ORAL_TABLET | Freq: Every day | ORAL | 0 refills | Status: AC
  Filled 2022-06-04: qty 30, 30d supply, fill #0

## 2022-06-04 MED ORDER — LISINOPRIL 10 MG PO TABS
10.0000 mg | ORAL_TABLET | Freq: Every day | ORAL | 2 refills | Status: AC
  Filled 2022-06-04: qty 30, 30d supply, fill #0

## 2022-06-04 MED ORDER — CEFDINIR 300 MG PO CAPS
300.0000 mg | ORAL_CAPSULE | Freq: Two times a day (BID) | ORAL | 0 refills | Status: AC
  Filled 2022-06-04: qty 6, 3d supply, fill #0

## 2022-06-04 NOTE — Care Management Important Message (Signed)
Important Message  Patient Details  Name: Jonathan Wilson MRN: [id number] Date of Birth: Oct 24, 1950   Medicare Important Message Given:  Yes  Patient left prior to IM delivery will mail the IM to the patient home.    [name] 06/04/2022, 3:11 PM

## 2022-06-04 NOTE — Plan of Care (Signed)
  Problem: Education: Goal: Knowledge of General Education information will improve Description: Including pain rating scale, medication(s)/side effects and non-pharmacologic comfort measures 06/04/2022 1140 by Andrey Campanile, RN Outcome: Progressing 06/04/2022 0804 by Andrey Campanile, RN Outcome: Progressing   Problem: Health Behavior/Discharge Planning: Goal: Ability to manage health-related needs will improve 06/04/2022 1140 by Andrey Campanile, RN Outcome: Progressing 06/04/2022 0804 by Andrey Campanile, RN Outcome: Progressing   Problem: Clinical Measurements: Goal: Ability to maintain clinical measurements within normal limits will improve 06/04/2022 1140 by Andrey Campanile, RN Outcome: Progressing 06/04/2022 0804 by Andrey Campanile, RN Outcome: Progressing Goal: Will remain free from infection 06/04/2022 1140 by Andrey Campanile, RN Outcome: Progressing 06/04/2022 0804 by Andrey Campanile, RN Outcome: Progressing Goal: Diagnostic test results will improve 06/04/2022 1140 by Andrey Campanile, RN Outcome: Progressing 06/04/2022 0804 by Andrey Campanile, RN Outcome: Progressing Goal: Respiratory complications will improve 06/04/2022 1140 by Andrey Campanile, RN Outcome: Progressing 06/04/2022 0804 by Andrey Campanile, RN Outcome: Progressing Goal: Cardiovascular complication will be avoided 06/04/2022 1140 by Andrey Campanile, RN Outcome: Progressing 06/04/2022 0804 by Andrey Campanile, RN Outcome: Progressing   Problem: Activity: Goal: Risk for activity intolerance will decrease 06/04/2022 1140 by Andrey Campanile, RN Outcome: Progressing 06/04/2022 0804 by Andrey Campanile, RN Outcome: Progressing   Problem: Nutrition: Goal: Adequate nutrition will be maintained 06/04/2022 1140 by Andrey Campanile, RN Outcome: Progressing 06/04/2022 0804 by Andrey Campanile, RN Outcome: Progressing   Problem: Coping: Goal: Level of anxiety will decrease 06/04/2022 1140 by Andrey Campanile, RN Outcome: Progressing 06/04/2022 0804 by Andrey Campanile, RN Outcome: Progressing   Problem: Elimination: Goal: Will not experience complications related to bowel motility 06/04/2022 1140 by Andrey Campanile, RN Outcome: Progressing 06/04/2022 0804 by Andrey Campanile, RN Outcome: Progressing Goal: Will not experience complications related to urinary retention 06/04/2022 1140 by Andrey Campanile, RN Outcome: Progressing 06/04/2022 0804 by Andrey Campanile, RN Outcome: Progressing   Problem: Pain Managment: Goal: General experience of comfort will improve 06/04/2022 1140 by Andrey Campanile, RN Outcome: Progressing 06/04/2022 0804 by Andrey Campanile, RN Outcome: Progressing   Problem: Safety: Goal: Ability to remain free from injury will improve 06/04/2022 1140 by Andrey Campanile, RN Outcome: Progressing 06/04/2022 0804 by Andrey Campanile, RN Outcome: Progressing   Problem: Skin Integrity: Goal: Risk for impaired skin integrity will decrease 06/04/2022 1140 by Andrey Campanile, RN Outcome: Progressing 06/04/2022 0804 by Andrey Campanile, RN Outcome: Progressing

## 2022-06-04 NOTE — Plan of Care (Signed)

## 2022-06-04 NOTE — Discharge Summary (Signed)
Physician Discharge Summary  Jonathan Wilson AST:[id number] DOB: Nov 21, 1950 DOA: 05/31/2022  PCP: Vicenta Aly, FNP  Admit date: 05/31/2022 Discharge date: 06/04/2022  Admitted From: Homeless Discharge disposition: Homeless shelter  Recommendations at discharge:  Complete 3 more days of antibiotics Inhalers ordered.   Brief narrative: Jonathan Wilson is a 72 y.o. male with PMH significant for HTN, HLD, CAD, infrarenal AAA, diverticulosis, anxiety/depression, renal cell cancer, chronic hep C methadone dependency. Patient has had multiple recent admissions 11/20-24 and 11/29-12/1.  His last admission was for influenza.  Per patient he believes he was discharged too soon both times.  He was discharged on oxygen which he eventually weaned himself from.  He states he has not really felt well since discharge, however, on Tuesday 12/26 he felt the best he had felt in 2 weeks.   Next day, he started getting sick again.  He developed nausea, vomiting, terrible headache, a cough that was occasionally productive, wheezing, pleuritic type chest pain, lightheadedness, dizziness, poor oral intake generalized weakness, and declining baseline exercise capacity.  He also had fever and chills.   In the ED, patient had no fever, heart rate in 50s, blood pressure in low 100s, breathing on room air Labs showed WC count normal at 5.3, hemoglobin 12, platelet 300, BUN/creatinine 21/1.62 Troponin and BNP normal Respiratory virus panel negative for influenza, COVID CT chest showed multifocal pneumonia with infiltrates in lingula as well as both lower lobes.  There also was evidence of pulmonary artery hypertension Blood culture sent.  Started on antibiotics Admitted to [facility] See below for details  Subjective: Patient was seen and examined this morning. Sitting up at the edge of the bed.   Hospital course: Multifocal pneumonia Acute exacerbation of COPD Recent hospitalizations for flu Presented with  worsening respiratory symptoms.  WBC count normal.  Procalcitonin level normal. Respiratory virus panel negative this time but CT chest showed multifocal infiltrates Patient was started on IV Rocephin, IV azithromycin.  Clinically improving.  Subsequently switched to oral Omnicef.  3 more days of oral Omnicef at discharge. Continue Breo Ellipta, albuterol inhaler. Did not require supplemental oxygen on ambulation. Recent Labs  Lab 05/31/22 1522 05/31/22 2238 06/01/22 0324 06/01/22 0325  WBC 5.3 4.7  --  4.1  PROCALCITON  --   --  <0.10  --    CKD 3B It seems patient's baseline creatinine is close to 1.5.  Mildly elevated.  Given adequate hydration.  Creatinine stable. Recent Labs    08/01/21 0259 04/22/22 2047 04/23/22 0045 04/24/22 0358 04/25/22 0144 04/26/22 0220 05/01/22 1535 05/02/22 0500 05/31/22 1522 05/31/22 2238 06/01/22 0325  BUN 28* 28* 32* 31* 29* 28* 25* 16 21  --  21  CREATININE 1.44* 2.73* 2.90* 1.66* 1.58* 1.32* 1.52* 1.19 1.62* 1.58* 1.63*   Essential hypertension PTA on Norvasc 5 mg daily, lisinopril 10 mg daily. Continue both.  CAD On aspirin.  Not on statin, ???  Anxiety and depression Continue Paxil.   Chronic methadone dependency Continue methadone replacement therapy as before  Chronic Hepatitis C Liver enzymes normal  Constipation Linzess as needed  Wounds:  -    Discharge Exam:   Vitals:   06/03/22 1939 06/04/22 0427 06/04/22 0727 06/04/22 0918  BP: 110/66 (!) 145/76 138/69   Pulse: (!) 49 (!) 45 (!) 49   Resp: '17 17 18   '$ Temp: 98.7 F (37.1 C) 98.1 F (36.7 C) 98 F (36.7 C)   TempSrc: Oral Oral Oral   SpO2: 96% 93%  99% 96%  Weight:      Height:        Body mass index is 28.5 kg/m.   General exam: Pleasant, elderly Caucasian male. Skin: No rashes, lesions or ulcers. HEENT: Atraumatic, normocephalic, no obvious bleeding Lungs: Clear to auscultation bilaterally CVS: Regular rate and rhythm, no murmur GI/Abd soft,  nontender, nondistended, bowel sound present CNS: Alert, awake, oriented x 3 Psychiatry: sad affect Extremities: No pedal edema, no calf tenderness  Follow ups:    Follow-up Information     Vicenta Aly, [place]. Schedule an appointment as soon as possible for a visit.   Specialty: Nurse Practitioner Why: Please schedule an appointment in the next 7-10 days for a hospital follow up. Contact information: [place] [address] [phone]                 Discharge Instructions:   Discharge Instructions     Call MD for:  difficulty breathing, headache or visual disturbances   Complete by: As directed    Call MD for:  extreme fatigue   Complete by: As directed    Call MD for:  hives   Complete by: As directed    Call MD for:  persistant dizziness or light-headedness   Complete by: As directed    Call MD for:  persistant nausea and vomiting   Complete by: As directed    Call MD for:  severe uncontrolled pain   Complete by: As directed    Call MD for:  temperature >100.4   Complete by: As directed    Diet - low sodium heart healthy   Complete by: As directed    Discharge instructions   Complete by: As directed    Recommendations at discharge:   Complete 3 more days of antibiotics  Inhalers ordered.  General discharge instructions: Follow with Primary MD Vicenta Aly, FNP in 7 days  Please request your PCP  to go over your hospital tests, procedures, radiology results at the follow up. Please get your medicines reviewed and adjusted.  Your PCP may decide to repeat certain labs or tests as needed. Do not drive, operate heavy machinery, perform activities at heights, swimming or participation in water activities or provide baby sitting services if your were admitted for syncope or siezures until you have seen by Primary MD or a Neurologist and advised to do so again. [place] Controlled Substance Reporting System database was  reviewed. Do not drive, operate heavy machinery, perform activities at heights, swim, participate in water activities or provide baby-sitting services while on medications for pain, sleep and mood until your outpatient physician has reevaluated you and advised to do so again.  You are strongly recommended to comply with the dose, frequency and duration of prescribed medications. Activity: As tolerated with Full fall precautions use walker/cane & assistance as needed Avoid using any recreational substances like cigarette, tobacco, alcohol, or non-prescribed drug. If you experience worsening of your admission symptoms, develop shortness of breath, life threatening emergency, suicidal or homicidal thoughts you must seek medical attention immediately by calling 911 or calling your MD immediately  if symptoms less severe. You must read complete instructions/literature along with all the possible adverse reactions/side effects for all the medicines you take and that have been prescribed to you. Take any new medicine only after you have completely understood and accepted all the possible adverse reactions/side effects.  Wear Seat belts while driving. You were cared for by a hospitalist during  your hospital stay. If you have any questions about your discharge medications or the care you received while you were in the hospital after you are discharged, you can call the unit and ask to speak with the hospitalist or the covering physician. Once you are discharged, your primary care physician will handle any further medical issues. Please note that NO REFILLS for any discharge medications will be authorized once you are discharged, as it is imperative that you return to your primary care physician (or establish a relationship with a primary care physician if you do not have one).   Increase activity slowly   Complete by: As directed        Discharge Medications:   Allergies as of 06/04/2022   No Known Allergies       Medication List     STOP taking these medications    methocarbamol 500 MG tablet Commonly known as: ROBAXIN       TAKE these medications    albuterol 108 (90 Base) MCG/ACT inhaler Commonly known as: VENTOLIN HFA Inhale 2 puffs into the lungs every 6 (six) hours as needed for shortness of breath.   amLODipine 5 MG tablet Commonly known as: NORVASC Take 1 tablet (5 mg total) by mouth daily.   ammonium lactate 12 % lotion Commonly known as: AmLactin Apply 1 Application topically as needed for dry skin.   aspirin EC 81 MG tablet Take 81 mg by mouth daily.   Breo Ellipta 100-25 MCG/ACT Aepb Generic drug: fluticasone furoate-vilanterol Inhale 1 puff into the lungs daily.   cefdinir 300 MG capsule Commonly known as: OMNICEF Take 1 capsule (300 mg total) by mouth every 12 (twelve) hours for 3 days.   Linzess 145 MCG Caps capsule Generic drug: linaclotide Take 145 mcg by mouth daily as needed (constipation).   lisinopril 10 MG tablet Commonly known as: ZESTRIL Take 1 tablet (10 mg total) by mouth daily.   METHADONE HCL PO Take 90 mg by mouth daily.   multivitamin with minerals Tabs tablet Take 1 tablet by mouth daily.   PARoxetine 40 MG tablet Commonly known as: PAXIL Take 1 tablet (40 mg total) by mouth daily.         The results of significant diagnostics from this hospitalization (including imaging, microbiology, ancillary and laboratory) are listed below for reference.    Procedures and Diagnostic Studies:   CT Chest W Contrast  Result Date: 05/31/2022 CLINICAL DATA:  Pneumonia, suspected complications EXAM: CT CHEST WITH CONTRAST TECHNIQUE: Multidetector CT imaging of the chest was performed during intravenous contrast administration. RADIATION DOSE REDUCTION: This exam was performed according to the departmental dose-optimization program which includes automated exposure control, adjustment of the mA and/or kV according to patient size and/or use  of iterative reconstruction technique. CONTRAST:  57m OMNIPAQUE IOHEXOL 350 MG/ML SOLN COMPARISON:  Chest radiographs done earlier today and chest radiographs done on 05/01/2022 TD FINDINGS: Cardiovascular: Coronary artery calcifications are seen. There are scattered calcifications in thoracic aorta and its major branches. There is ectasia of main pulmonary artery measuring 3.5 cm suggesting possible pulmonary arterial hypertension. There are no filling defects in central pulmonary arteries. Contrast density in the peripheral pulmonary artery branches is less than adequate to evaluate the lumen. Mediastinum/Nodes: There are slightly enlarged lymph nodes in mediastinum, possibly suggesting reactive hyperplasia. Lungs/Pleura: There are new patchy infiltrates seen lingula and both lower lobes, more so in the left lower lobe suggesting multifocal pneumonia. There is no pleural effusion or pneumothorax. Centrilobular emphysema  is seen. There is mild peribronchial thickening. There is no pleural effusion or pneumothorax. Upper Abdomen: Surgical clips are seen in gallbladder fossa. Musculoskeletal: No acute findings are seen. IMPRESSION: There is interval appearance of patchy alveolar infiltrates in lingula and both lower lobes, more so in the left lower lobe suggesting multifocal pneumonia. Follow-up studies in 3-4 weeks following trial of antibiotic therapy may be considered to assess resolution. There is no pleural effusion. There is ectasia of main pulmonary artery suggesting pulmonary arterial hypertension. There are no intraluminal filling defects seen central pulmonary artery branches. Evaluation of peripheral pulmonary artery branches is limited due to less than optimal contrast enhancement in this routine CT chest. If there is clinical suspicion for pulmonary embolism, follow-up CT angiogram should be considered. Coronary artery calcifications are seen.  Aortic atherosclerosis. Electronically Signed   By: Elmer Picker M.D.   On: 05/31/2022 21:03   DG Chest 2 View  Result Date: 05/31/2022 CLINICAL DATA:  Cough.  Chest pain. EXAM: CHEST - 2 VIEW COMPARISON:  May 01, 2022. FINDINGS: The heart size and mediastinal contours are within normal limits. Right lung is clear. Mild left lingular opacity is noted concerning for pneumonia or atelectasis. The visualized skeletal structures are unremarkable. IMPRESSION: Mild left lingular opacity concerning for pneumonia or atelectasis. Followup PA and lateral chest X-ray is recommended in 3-4 weeks following trial of antibiotic therapy to ensure resolution and exclude underlying malignancy. Electronically Signed   By: Marijo Conception M.D.   On: 05/31/2022 16:02     Labs:   Basic Metabolic Panel: Recent Labs  Lab 05/31/22 1522 05/31/22 2238 06/01/22 0325  NA 139  --  137  K 5.0  --  4.6  CL 104  --  102  CO2 27  --  27  GLUCOSE 79  --  116*  BUN 21  --  21  CREATININE 1.62* 1.58* 1.63*  CALCIUM 9.3  --  9.0   GFR Estimated Creatinine Clearance: 53.4 mL/min (A) (by C-G formula based on SCr of 1.63 mg/dL (H)). Liver Function Tests: Recent Labs  Lab 05/31/22 1522  AST 15  ALT 10  ALKPHOS 61  BILITOT 0.8  PROT 7.1  ALBUMIN 3.7   No results for input(s): "LIPASE", "AMYLASE" in the last 168 hours. No results for input(s): "AMMONIA" in the last 168 hours. Coagulation profile No results for input(s): "INR", "PROTIME" in the last 168 hours.  CBC: Recent Labs  Lab 05/31/22 1522 05/31/22 2238 06/01/22 0325  WBC 5.3 4.7 4.1  NEUTROABS 3.4  --  2.1  HGB 12.0* 10.6* 10.6*  HCT 37.5* 33.0* 32.0*  MCV 95.9 96.5 95.5  PLT 300 235 221   Cardiac Enzymes: No results for input(s): "CKTOTAL", "CKMB", "CKMBINDEX", "TROPONINI" in the last 168 hours. BNP: Invalid input(s): "POCBNP" CBG: No results for input(s): "GLUCAP" in the last 168 hours. D-Dimer No results for input(s): "DDIMER" in the last 72 hours. Hgb A1c No results for input(s):  "HGBA1C" in the last 72 hours. Lipid Profile No results for input(s): "CHOL", "HDL", "LDLCALC", "TRIG", "CHOLHDL", "LDLDIRECT" in the last 72 hours. Thyroid function studies No results for input(s): "TSH", "T4TOTAL", "T3FREE", "THYROIDAB" in the last 72 hours.  Invalid input(s): "FREET3" Anemia work up No results for input(s): "VITAMINB12", "FOLATE", "FERRITIN", "TIBC", "IRON", "RETICCTPCT" in the last 72 hours. Microbiology Recent Results (from the past 240 hour(s))  Resp panel by RT-PCR (RSV, Flu A&B, Covid) Anterior Nasal Swab     Status: None  Collection Time: 05/31/22  3:29 PM   Specimen: Anterior Nasal Swab  Result Value Ref Range Status   SARS Coronavirus 2 by RT PCR NEGATIVE NEGATIVE Final    Comment: (NOTE) SARS-CoV-2 target nucleic acids are NOT DETECTED.  The SARS-CoV-2 RNA is generally detectable in upper respiratory specimens during the acute phase of infection. The lowest concentration of SARS-CoV-2 viral copies this assay can detect is 138 copies/mL. A negative result does not preclude SARS-Cov-2 infection and should not be used as the sole basis for treatment or other patient management decisions. A negative result may occur with  improper specimen collection/handling, submission of specimen other than nasopharyngeal swab, presence of viral mutation(s) within the areas targeted by this assay, and inadequate number of viral copies(<138 copies/mL). A negative result must be combined with clinical observations, patient history, and epidemiological information. The expected result is Negative.  Fact Sheet for Patients:  EntrepreneurPulse.com.au  Fact Sheet for Healthcare Providers:  IncredibleEmployment.be  This test is no t yet approved or cleared by the [place] FDA and  has been authorized for detection and/or diagnosis of SARS-CoV-2 by FDA under an Emergency Use Authorization (EUA). This EUA will remain  in effect  (meaning this test can be used) for the duration of the COVID-19 declaration under Section 564(b)(1) of the Act, 21 U.S.C.section 360bbb-3(b)(1), unless the authorization is terminated  or revoked sooner.       Influenza A by PCR NEGATIVE NEGATIVE Final   Influenza B by PCR NEGATIVE NEGATIVE Final    Comment: (NOTE) The Xpert Xpress SARS-CoV-2/FLU/RSV plus assay is intended as an aid in the diagnosis of influenza from Nasopharyngeal swab specimens and should not be used as a sole basis for treatment. Nasal washings and aspirates are unacceptable for Xpert Xpress SARS-CoV-2/FLU/RSV testing.  Fact Sheet for Patients: EntrepreneurPulse.com.au  Fact Sheet for Healthcare Providers: IncredibleEmployment.be  This test is not yet approved or cleared by the [place] FDA and has been authorized for detection and/or diagnosis of SARS-CoV-2 by FDA under an Emergency Use Authorization (EUA). This EUA will remain in effect (meaning this test can be used) for the duration of the COVID-19 declaration under Section 564(b)(1) of the Act, 21 U.S.C. section 360bbb-3(b)(1), unless the authorization is terminated or revoked.     Resp Syncytial Virus by PCR NEGATIVE NEGATIVE Final    Comment: (NOTE) Fact Sheet for Patients: EntrepreneurPulse.com.au  Fact Sheet for Healthcare Providers: IncredibleEmployment.be  This test is not yet approved or cleared by the [place] FDA and has been authorized for detection and/or diagnosis of SARS-CoV-2 by FDA under an Emergency Use Authorization (EUA). This EUA will remain in effect (meaning this test can be used) for the duration of the COVID-19 declaration under Section 564(b)(1) of the Act, 21 U.S.C. section 360bbb-3(b)(1), unless the authorization is terminated or revoked.  Performed at [facility], [place] [address], [place], [place] [address]   Culture, blood  (routine x 2)     Status: None (Preliminary result)   Collection Time: 05/31/22  7:36 PM   Specimen: BLOOD  Result Value Ref Range Status   Specimen Description BLOOD BLOOD RIGHT FOREARM  Final   Special Requests   Final    BOTTLES DRAWN AEROBIC AND ANAEROBIC Blood Culture adequate volume   Culture   Final    NO GROWTH 4 DAYS Performed at [facility], [place] [address] [place], [place] [address]    Report Status PENDING  Incomplete  Culture, blood (routine  x 2)     Status: None (Preliminary result)   Collection Time: 05/31/22  7:36 PM   Specimen: BLOOD RIGHT HAND  Result Value Ref Range Status   Specimen Description BLOOD RIGHT HAND  Final   Special Requests   Final    BOTTLES DRAWN AEROBIC AND ANAEROBIC Blood Culture results may not be optimal due to an inadequate volume of blood received in culture bottles   Culture   Final    NO GROWTH 4 DAYS Performed at [facility], [place] [address], [place], [place] [address]    Report Status PENDING  Incomplete    Time coordinating discharge: 35 minutes  Signed: Niajah Sipos  [facility] 06/04/2022, 12:51 PM

## 2022-06-04 NOTE — TOC Initial Note (Addendum)
Transition of Care [facility]) - Initial/Assessment Note    Patient Details  Name: Jonathan Wilson MRN: [id number] Date of Birth: 1950-12-09  Transition of Care [facility]) CM/SW Contact:    Curlene Labrum, RN Phone Number: 06/04/2022, 10:44 AM  Clinical Narrative:                 CM met with the patient and family member (brother-in-law with Alzheimer's disease) to discuss homeless issues at this time.  The patient states that he was evicted from his hotel and does not get paid disability check until tomorrow, 06/05/21.  The patient is calling friends and the homeless shelters to coordinate a place to stay tonight.  The patient was provided with Shelter resources and given 6 bus tickets for transportation.    The patient is currently active with [facility] and plans to follow up in the am if he is discharged today.  CM alerted the attending physician and beside nursing regarding patient's lack of housing and follow up with [facility] today.  1430 - Patient states that he is willing to go to [facility] and check in with them before they close.  I taxi voucher was provided to transport the patient to the [facility] and patient and brother plan to stay at the [facility] after 8 pm when they open for the [facility] tonight.  The patient has 6 bus tickets that were provided by me to assist with transportation for follow up.  The patient gets disability check deposited in his bank account tomorrow morning and plans on paying for a hotel until he is able to find housing.  The patient has TOC meds for discharge and has follow up tomorrow with the Methadone clinic in the am with [facility].  Expected Discharge Plan: Homeless Shelter Barriers to Discharge: Family Issues   Patient Goals and CMS Choice Patient states their goals for this hospitalization and ongoing recovery are:: Patient wants to get better and go to hotel tomorrow CMS Medicare.gov Compare Post Acute Care list provided to::  Patient Choice offered to / list presented to : Patient      Expected Discharge Plan and Services   Discharge Planning Services: CM Consult   Living arrangements for the past 2 months: Hotel/Motel (Patient was evicted from his hotel and is working on finding a friend/shelter to stay tonight)                                      Prior Living Arrangements/Services Living arrangements for the past 2 months: Hotel/Motel (Patient was evicted from his hotel and is working on finding a friend/shelter to [employer]) Lives with:: Roommate (takes care of family member with Alzheimer's disease) Patient language and need for interpreter reviewed:: Yes Do you feel safe going back to the place where you live?: No (Patient was evicted from hotel and trying to make arrangements to stay in shelter or hotel - pending at this time)      Need for Family Participation in Patient Care: Yes (Comment) Care giver support system in place?: Yes (comment)   Criminal Activity/Legal Involvement Pertinent to Current Situation/Hospitalization: No - Comment as needed  Activities of Daily Living Home Assistive Devices/Equipment: Oxygen ADL Screening (condition at time of admission) Patient's cognitive ability adequate to safely complete daily activities?: No Is the patient deaf or have difficulty hearing?: No Does the patient have difficulty seeing, even  when wearing glasses/contacts?: No Does the patient have difficulty concentrating, remembering, or making decisions?: No Patient able to express need for assistance with ADLs?: No Does the patient have difficulty dressing or bathing?: No Independently performs ADLs?: Yes (appropriate for developmental age) Does the patient have difficulty walking or climbing stairs?: No Weakness of Legs: None Weakness of Arms/Hands: None  Permission Sought/Granted Permission sought to share information with : Case Manager       Permission granted to share info w  AGENCY: Shelter resources        Emotional Assessment Appearance:: Appears stated age Attitude/Demeanor/Rapport: Gracious Affect (typically observed): Accepting Orientation: : Oriented to Self, Oriented to Place, Oriented to  Time, Oriented to Situation Alcohol / Substance Use:  (Patient is active with [facility]) Psych Involvement: No (comment)  Admission diagnosis:  Generalized weakness [R53.1] Multifocal pneumonia [J18.9] Patient Active Problem List   Diagnosis Date Noted   Multifocal pneumonia 05/31/2022   Acute respiratory failure with hypoxia ([place]) 05/01/2022   Influenza A 05/01/2022   High anion gap metabolic acidosis 41/74/0814   Chronic kidney disease, stage 3b ([place]) 04/23/2022   Headache 04/23/2022   Hyperkalemia 08/04/2021   Physical deconditioning 07/31/2021   Acute gastroenteritis 07/30/2021   Aortic atherosclerosis ([place]) 07/30/2021   AAA (abdominal aortic aneurysm) ([place]) 07/30/2021   Dehydration 07/30/2021   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[place]) 07/30/2021   Diarrhea 07/06/2020   SIRS (systemic inflammatory response syndrome) ([place]) 07/06/2020   COPD exacerbation ([place]) 11/20/2016   AKI (acute kidney injury) ([place]) 11/20/2016   Hyponatremia 11/20/2016   Syncope 09/19/2015   History of renal cell cancer 08/07/2015   Bipolar disorder ([place]) 06/28/2015   Lumbago 05/24/2015   Thrombocytopenia ([place]) 04/29/2015   Chest pain 04/29/2015   GAD (generalized anxiety disorder)    Substance induced mood disorder ([place])    Cholecystitis, acute 01/13/2014   Choledocholithiasis 01/12/2014   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   Essential hypertension    Hyperlipidemia    Vitamin D deficiency    PSYCHOSIS 07/17/2010   TOBACCO ABUSE 07/17/2010   COPD (chronic obstructive pulmonary disease) ([place]) 02/24/2009   GOUT 02/07/2009   Hepatitis C 02/07/2009   PERSONAL HISTORY PNEUMONIA RECURRENT 01/30/2009   PCP:  Vicenta Aly, FNP Pharmacy:   [facility] [address] [place] - [address] [place] [address] Phone: [phone] Fax: [facility] [address] [place] [address] Phone: [phone] Fax: [phone]     Social Determinants of Health (SDOH) Social History: [place]: No Food Insecurity (06/01/2022)  Recent Concern: Food Insecurity - Food Insecurity Present (05/03/2022)  Housing: Low Risk  (06/01/2022)  Recent Concern: Housing - High Risk (05/03/2022)  Transportation Needs: No Transportation Needs (06/01/2022)  Recent Concern: Transportation Needs - Unmet Transportation Needs (05/03/2022)  Utilities: Not At Risk (06/01/2022)  Recent Concern: Utilities - At Risk (05/03/2022)  Tobacco Use: Medium Risk (05/31/2022)   SDOH Interventions:     Readmission Risk Interventions    06/04/2022   10:41 AM 05/03/2022    1:07 PM  Readmission Risk Prevention Plan  Transportation Screening Complete Complete  PCP or Specialist Appt within 3-5 Days Complete Complete  HRI or [place] Complete Complete  Social Work Consult for [place] Planning/Counseling Complete Complete  Palliative Care Screening Complete Not Applicable  Medication Review Press photographer) Complete Complete

## 2022-06-05 LAB — CULTURE, BLOOD (ROUTINE X 2)
Culture: NO GROWTH
Culture: NO GROWTH
Special Requests: ADEQUATE

## 2022-06-18 ENCOUNTER — Ambulatory Visit (INDEPENDENT_AMBULATORY_CARE_PROVIDER_SITE_OTHER): Payer: 59 | Admitting: Pulmonary Disease

## 2022-06-18 DIAGNOSIS — J441 Chronic obstructive pulmonary disease with (acute) exacerbation: Secondary | ICD-10-CM

## 2022-06-18 LAB — PULMONARY FUNCTION TEST
DL/VA % pred: 97 %
DL/VA: 3.92 ml/min/mmHg/L
DLCO cor % pred: 104 %
DLCO cor: 27.88 ml/min/mmHg
DLCO unc % pred: 90 %
DLCO unc: 24.13 ml/min/mmHg
FEF 25-75 Pre: 1.4 L/sec
FEF2575-%Pred-Pre: 54 %
FEV1-%Pred-Pre: 75 %
FEV1-Pre: 2.54 L
FEV1FVC-%Pred-Pre: 80 %
FEV6-%Pred-Pre: 97 %
FEV6-Pre: 4.22 L
FEV6FVC-%Pred-Pre: 105 %
FVC-%Pred-Pre: 93 %
FVC-Pre: 4.3 L
Pre FEV1/FVC ratio: 59 %
Pre FEV6/FVC Ratio: 100 %
RV % pred: 165 %
RV: 4.15 L
TLC % pred: 118 %
TLC: 8.55 L

## 2022-06-18 NOTE — Progress Notes (Signed)
Spirometry/DLCO and lung volumes performed today.

## 2022-06-18 NOTE — Patient Instructions (Signed)
Spirometry/DLCO and lung volumes performed today.

## 2022-06-19 ENCOUNTER — Other Ambulatory Visit: Payer: Medicare Other

## 2022-07-03 ENCOUNTER — Other Ambulatory Visit: Payer: 59

## 2022-07-17 ENCOUNTER — Ambulatory Visit (HOSPITAL_BASED_OUTPATIENT_CLINIC_OR_DEPARTMENT_OTHER): Payer: 59 | Admitting: Pulmonary Disease

## 2022-07-18 ENCOUNTER — Ambulatory Visit
Admission: RE | Admit: 2022-07-18 | Discharge: 2022-07-18 | Disposition: A | Discharge status: Home / Self Care | Payer: 59 | Source: Ambulatory Visit | Attending: Nurse Practitioner | Admitting: Nurse Practitioner

## 2022-07-18 DIAGNOSIS — K7469 Other cirrhosis of liver: Secondary | ICD-10-CM

## 2022-08-27 ENCOUNTER — Ambulatory Visit: Payer: 59 | Admitting: Podiatry

## 2022-09-11 ENCOUNTER — Ambulatory Visit: Payer: 59 | Admitting: Podiatry

## 2022-10-01 ENCOUNTER — Other Ambulatory Visit: Payer: Self-pay | Admitting: Acute Care

## 2022-10-01 DIAGNOSIS — F17210 Nicotine dependence, cigarettes, uncomplicated: Secondary | ICD-10-CM

## 2022-10-01 DIAGNOSIS — Z122 Encounter for screening for malignant neoplasm of respiratory organs: Secondary | ICD-10-CM

## 2022-10-01 DIAGNOSIS — Z87891 Personal history of nicotine dependence: Secondary | ICD-10-CM

## 2022-10-22 ENCOUNTER — Emergency Department (HOSPITAL_COMMUNITY): Payer: 59

## 2022-10-22 ENCOUNTER — Emergency Department (HOSPITAL_COMMUNITY)
Admission: EM | Admit: 2022-10-22 | Discharge: 2022-10-22 | Disposition: A | Discharge status: Home / Self Care | Payer: 59 | Attending: Emergency Medicine | Admitting: Emergency Medicine

## 2022-10-22 ENCOUNTER — Other Ambulatory Visit: Payer: Self-pay

## 2022-10-22 ENCOUNTER — Encounter (HOSPITAL_COMMUNITY): Payer: Self-pay

## 2022-10-22 DIAGNOSIS — R051 Acute cough: Secondary | ICD-10-CM | POA: Insufficient documentation

## 2022-10-22 DIAGNOSIS — Z7982 Long term (current) use of aspirin: Secondary | ICD-10-CM | POA: Insufficient documentation

## 2022-10-22 DIAGNOSIS — Z1152 Encounter for screening for COVID-19: Secondary | ICD-10-CM | POA: Diagnosis not present

## 2022-10-22 DIAGNOSIS — J449 Chronic obstructive pulmonary disease, unspecified: Secondary | ICD-10-CM | POA: Insufficient documentation

## 2022-10-22 DIAGNOSIS — F1120 Opioid dependence, uncomplicated: Secondary | ICD-10-CM | POA: Insufficient documentation

## 2022-10-22 DIAGNOSIS — I10 Essential (primary) hypertension: Secondary | ICD-10-CM | POA: Diagnosis not present

## 2022-10-22 DIAGNOSIS — Z79899 Other long term (current) drug therapy: Secondary | ICD-10-CM | POA: Diagnosis not present

## 2022-10-22 DIAGNOSIS — R059 Cough, unspecified: Secondary | ICD-10-CM | POA: Diagnosis present

## 2022-10-22 LAB — BASIC METABOLIC PANEL
Anion gap: 10 (ref 5–15)
BUN: 24 mg/dL — ABNORMAL HIGH (ref 8–23)
CO2: 28 mmol/L (ref 22–32)
Calcium: 9.6 mg/dL (ref 8.9–10.3)
Chloride: 101 mmol/L (ref 98–111)
Creatinine, Ser: 1.33 mg/dL — ABNORMAL HIGH (ref 0.61–1.24)
GFR, Estimated: 58 mL/min — ABNORMAL LOW (ref >60)
Glucose, Bld: 87 mg/dL (ref 70–99)
Potassium: 4.2 mmol/L (ref 3.5–5.1)
Sodium: 139 mmol/L (ref 135–145)

## 2022-10-22 LAB — CBC
HCT: 39.9 % (ref 39.0–52.0)
Hemoglobin: 13.1 g/dL (ref 13.0–17.0)
MCH: 31.5 pg (ref 26.0–34.0)
MCHC: 32.8 g/dL (ref 30.0–36.0)
MCV: 95.9 fL (ref 80.0–100.0)
Platelets: 223 10*3/uL (ref 150–400)
RBC: 4.16 MIL/uL — ABNORMAL LOW (ref 4.22–5.81)
RDW: 12.6 % (ref 11.5–15.5)
WBC: 6.6 10*3/uL (ref 4.0–10.5)
nRBC: 0 % (ref 0.0–0.2)

## 2022-10-22 LAB — SARS CORONAVIRUS 2 BY RT PCR: SARS Coronavirus 2 by RT PCR: NEGATIVE

## 2022-10-22 LAB — TROPONIN I (HIGH SENSITIVITY): Troponin I (High Sensitivity): 5 ng/L (ref <18)

## 2022-10-22 MED ORDER — ONDANSETRON 4 MG PO TBDP
4.0000 mg | ORAL_TABLET | Freq: Once | ORAL | Status: AC
  Administered 2022-10-22: 4 mg via ORAL
  Filled 2022-10-22: qty 1

## 2022-10-22 NOTE — ED Triage Notes (Signed)
Pt arrives ambulatory POV with a c/o of waking up Friday with SOB and a feeling of being nauseous and chills.Pt states he has head pain and a feeling like something is moving around in his chest.Pt states that it feels like his pneumonia is coming back

## 2022-10-22 NOTE — Discharge Instructions (Addendum)
You were seen in the emergency department for your cough and your shortness of breath.  Your workup showed no signs of pneumonia or fluid on your lungs and you likely have another type of viral infection.  You can continue symptomatic treatment with your albuterol inhaler as needed and you can try raw honey in tea or hot water to help with your cough.  You can follow-up with your primary doctor in the next few days to have your symptoms rechecked.  You should return to the emergency department for significantly worsening shortness of breath, fevers, severe chest pain or if you have any other new or concerning symptoms.

## 2022-10-22 NOTE — ED Provider Notes (Signed)
[place] EMERGENCY DEPARTMENT AT [facility] Provider Note   CSN: [id number] Arrival date & time: 10/22/22  1320     History  Chief Complaint  Patient presents with   Pneumonia    Jonathan Wilson is a 72 y.o. male.  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  Patient reports that his symptoms started on Saturday.  He states that he had some associated chest pain and chest pressure.  He states that he feels generally fatigued.  He states that he has been using his inhaler over the weekend without significant relief.  He states that he feels similar to when he had pneumonia in the past.  He denies any fevers, lower extremity swelling.   Pneumonia       Home Medications Prior to Admission medications   Medication Sig Start Date End Date Taking? Authorizing Provider  albuterol (VENTOLIN HFA) 108 (90 Base) MCG/ACT inhaler Inhale 2 puffs into the lungs every 6 (six) hours as needed for shortness of breath. 06/04/22   Lorin Glass, MD  amLODipine (NORVASC) 5 MG tablet Take 1 tablet (5 mg total) by mouth daily. 06/04/22 09/02/22  Lorin Glass, MD  ammonium lactate (AMLACTIN) 12 % lotion Apply 1 Application topically as needed for dry skin. 05/28/22   Candelaria Stagers, DPM  aspirin EC 81 MG tablet Take 81 mg by mouth daily.    [provider]  LINZESS 145 MCG CAPS capsule Take 145 mcg by mouth daily as needed (constipation). 12/10/21   [provider]  lisinopril (ZESTRIL) 10 MG tablet Take 1 tablet (10 mg total) by mouth daily. 06/04/22 09/02/22  Lorin Glass, MD  METHADONE HCL PO Take 90 mg by mouth daily.    [provider]  Multiple Vitamin (MULTIVITAMIN WITH MINERALS) TABS tablet Take 1 tablet by mouth daily.    [provider]  PARoxetine (PAXIL) 40 MG tablet Take 1 tablet (40 mg total) by mouth daily.  06/04/22 07/04/22  Lorin Glass, MD      Allergies    Patient has no known allergies.    Review of Systems   Review of Systems  Physical Exam Updated Vital Signs BP (!) 156/97 (BP Location: Right Arm)   Pulse 66   Temp 98.5 F (36.9 C)   Resp 18   SpO2 100%  Physical Exam Vitals and nursing note reviewed.  Constitutional:      General: He is not in acute distress.    Appearance: Normal appearance.  HENT:     Head: Normocephalic and atraumatic.     Nose: Nose normal.     Mouth/Throat:     Mouth: Mucous membranes are moist.     Pharynx: Oropharynx is clear.  Eyes:     Extraocular Movements: Extraocular movements intact.     Conjunctiva/sclera: Conjunctivae normal.  Cardiovascular:     Rate and Rhythm: Normal rate and regular rhythm.     Heart sounds: Normal heart sounds.  Pulmonary:     Effort: Pulmonary effort is normal.     Breath sounds: Normal breath sounds.  Abdominal:     General: Abdomen is flat.     Palpations: Abdomen is soft.     Tenderness: There is no abdominal tenderness.  Musculoskeletal:        General: Normal range of motion.     Cervical back: Normal range  of motion.     Right lower leg: No edema.     Left lower leg: No edema.  Skin:    General: Skin is warm and dry.  Neurological:     General: No focal deficit present.     Mental Status: He is alert and oriented to person, place, and time.  Psychiatric:        Mood and Affect: Mood normal.        Behavior: Behavior normal.     ED Results / Procedures / Treatments   Labs (all labs ordered are listed, but only abnormal results are displayed) Labs Reviewed  BASIC METABOLIC PANEL - Abnormal; Notable for the following components:      Result Value   BUN 24 (*)    Creatinine, Ser 1.33 (*)    GFR, Estimated 58 (*)    All other components within normal limits  CBC - Abnormal; Notable for the following components:   RBC 4.16 (*)    All other components within normal limits  SARS CORONAVIRUS 2  BY RT PCR  TROPONIN I (HIGH SENSITIVITY)    EKG EKG Interpretation  Date/Time:  Tuesday Oct 22 2022 13:29:13 EDT Ventricular Rate:  69 PR Interval:  146 QRS Duration: 106 QT Interval:  386 QTC Calculation: 413 R Axis:   -32 Text Interpretation: Normal sinus rhythm Left axis deviation Incomplete right bundle branch block Abnormal ECG  Prior t-wave inversions now resolved compared to prior EKG Confirmed by Elayne Snare ([id number]) on 10/22/2022 2:37:25 PM  Radiology DG Chest 2 View  Result Date: 10/22/2022 CLINICAL DATA:  Chest pain EXAM: CHEST - 2 VIEW COMPARISON:  05/31/2022 FINDINGS: Cardiac size is within normal limits. There are no signs of pulmonary edema or focal pulmonary consolidation. There is interval clearing of infiltrates in left lower lung field seen in the previous study. Increase in AP diameter of chest and flattening of diaphragms suggests COPD. There is no pleural effusion or pneumothorax. IMPRESSION: COPD. There are no signs of pulmonary edema or focal pulmonary consolidation. Electronically Signed   By: Ernie Avena M.D.   On: 10/22/2022 14:25    Procedures Procedures    Medications Ordered in ED Medications  ondansetron (ZOFRAN-ODT) disintegrating tablet 4 mg (4 mg Oral Given 10/22/22 1536)    ED Course/ Medical Decision Making/ A&P Clinical Course as of 10/22/22 1623  Tue Oct 22, 2022  1541 Creatinine improved from baseline, initial troponin negative.  Symptoms have been ongoing for several days so single troponin is sufficient.  Patient will COVID swab performed and likely will be discharged pending need for antivirals. [VK]  1622 Patient reports that he would prefer not to wait for his COVID test and is stable for discharge home with outpatient follow-up.  He was given strict return precautions. [VK]    Clinical Course User Index [VK] Rexford Maus, DO                             Medical Decision Making This patient presents to the ED with  chief complaint(s) of cough, SOB with pertinent past medical history of COPD, HTN which further complicates the presenting complaint. The complaint involves an extensive differential diagnosis and also carries with it a high risk of complications and morbidity.    The differential diagnosis includes ACS, arrhythmia, anemia, pneumonia, pneumothorax, pulmonary edema, pleural effusion, viral syndrome, patient has no wheezing on exam making COPD exacerbation unlikely  Additional  history obtained: Additional history obtained from N/A Records reviewed previous admission documents and Primary Care Documents  ED Course and Reassessment: On patient's arrival to the emergency department he is satting well on room air in no acute distress.  EKG on arrival showed normal sinus rhythm without acute ischemic changes.  He was evaluated in triage and had x-ray and labs performed including troponin.  X-ray showed no evidence of pneumonia.  The patient will additionally have COVID test performed.  He was given Zofran for his nausea and will be closely reassessed.  Independent labs interpretation:  The following labs were independently interpreted: Creatinine improved from baseline otherwise no acute abnormalities  Independent visualization of imaging: - I independently visualized the following imaging with scope of interpretation limited to determining acute life threatening conditions related to emergency care: Chest x-ray, which revealed no acute disease  Consultation: - Consulted or discussed management/test interpretation w/ external professional: N/A  Consideration for admission or further workup: Patient has no emergent conditions requiring admission or further work-up at this time and is stable for discharge home with primary care follow-up  Social Determinants of health: N/A    Amount and/or Complexity of Data Reviewed Labs: ordered. Radiology: ordered.  Risk Prescription drug  management.          Final Clinical Impression(s) / ED Diagnoses Final diagnoses:  Acute cough    Rx / DC Orders ED Discharge Orders     None         Rexford Maus, DO 10/22/22 1623

## 2022-11-01 ENCOUNTER — Inpatient Hospital Stay: Admission: RE | Admit: 2022-11-01 | Payer: 59 | Source: Ambulatory Visit

## 2022-11-04 ENCOUNTER — Ambulatory Visit: Payer: 59 | Admitting: Podiatry

## 2022-11-05 ENCOUNTER — Ambulatory Visit: Payer: 59 | Admitting: Podiatry

## 2022-11-05 ENCOUNTER — Telehealth: Payer: Self-pay | Admitting: Podiatry

## 2022-11-05 NOTE — Telephone Encounter (Signed)
pt left message today at 151am that he needed to r/s this appt.  I cxled appt and called and left message for pt to call to r/s the appt.

## 2022-11-26 ENCOUNTER — Inpatient Hospital Stay: Admission: RE | Admit: 2022-11-26 | Payer: 59 | Source: Ambulatory Visit

## 2022-12-24 ENCOUNTER — Ambulatory Visit
Admission: RE | Admit: 2022-12-24 | Discharge: 2022-12-24 | Disposition: A | Discharge status: Home / Self Care | Payer: 59 | Source: Ambulatory Visit | Attending: Acute Care | Admitting: Acute Care

## 2022-12-24 DIAGNOSIS — Z87891 Personal history of nicotine dependence: Secondary | ICD-10-CM

## 2022-12-24 DIAGNOSIS — Z122 Encounter for screening for malignant neoplasm of respiratory organs: Secondary | ICD-10-CM

## 2022-12-24 DIAGNOSIS — F17210 Nicotine dependence, cigarettes, uncomplicated: Secondary | ICD-10-CM

## 2022-12-31 ENCOUNTER — Telehealth: Payer: Self-pay | Admitting: Acute Care

## 2022-12-31 NOTE — Telephone Encounter (Signed)
Call report CT 

## 2022-12-31 NOTE — Telephone Encounter (Signed)
Call report received and noted in [facility] dashboard.  Routed to provider IMPRESSION: 1. Lung-RADS 3, probably benign findings. Short-term follow-up in 6 months is recommended with repeat low-dose chest CT without contrast (please use the following order, "CT CHEST LCS NODULE FOLLOW-UP W/O CM"). 2.  Emphysema (ICD10-J43.9) and Aortic Atherosclerosis (ICD10-170.0)

## 2023-01-01 ENCOUNTER — Telehealth: Payer: Self-pay | Admitting: Acute Care

## 2023-01-01 ENCOUNTER — Other Ambulatory Visit: Payer: Self-pay

## 2023-01-01 DIAGNOSIS — Z87891 Personal history of nicotine dependence: Secondary | ICD-10-CM

## 2023-01-01 DIAGNOSIS — R911 Solitary pulmonary nodule: Secondary | ICD-10-CM

## 2023-01-01 NOTE — Telephone Encounter (Signed)
Spoke with patient by phone, using two patient identifiers, to review results of LDCT.  Two new lung nodules noted with recommendation to repeat in 6 months, as precaution.  Likely benign but since first LDCT, would like to review nodules sooner than waiting a year.  Patient in agreement.  Patient had pneumonia in December 2023 and also recently was evaluated for congested cough in May 2024.  Patient states he still feels some increased shortness of breath and requested an appointment with Ames Dura, NP.  He is also interested in a cancellation spot if something comes up sooner.  Appt made.  Atherosclerosis and emphysema noted. Patient does not currently take statin medication.  Order placed for 6 months follow up LDCT and results/plan faxed to PCP.

## 2023-02-04 ENCOUNTER — Emergency Department (HOSPITAL_COMMUNITY)
Admission: EM | Admit: 2023-02-04 | Discharge: 2023-02-04 | Disposition: A | Discharge status: Home / Self Care | Payer: 59 | Attending: Emergency Medicine | Admitting: Emergency Medicine

## 2023-02-04 ENCOUNTER — Ambulatory Visit: Payer: 59 | Admitting: Primary Care

## 2023-02-04 ENCOUNTER — Emergency Department (HOSPITAL_COMMUNITY): Payer: 59

## 2023-02-04 ENCOUNTER — Telehealth: Payer: Self-pay | Admitting: Primary Care

## 2023-02-04 ENCOUNTER — Other Ambulatory Visit: Payer: Self-pay

## 2023-02-04 DIAGNOSIS — N179 Acute kidney failure, unspecified: Secondary | ICD-10-CM | POA: Diagnosis not present

## 2023-02-04 DIAGNOSIS — I129 Hypertensive chronic kidney disease with stage 1 through stage 4 chronic kidney disease, or unspecified chronic kidney disease: Secondary | ICD-10-CM | POA: Diagnosis not present

## 2023-02-04 DIAGNOSIS — I7143 Infrarenal abdominal aortic aneurysm, without rupture: Secondary | ICD-10-CM | POA: Diagnosis not present

## 2023-02-04 DIAGNOSIS — R109 Unspecified abdominal pain: Secondary | ICD-10-CM | POA: Diagnosis present

## 2023-02-04 DIAGNOSIS — J449 Chronic obstructive pulmonary disease, unspecified: Secondary | ICD-10-CM | POA: Insufficient documentation

## 2023-02-04 DIAGNOSIS — R1084 Generalized abdominal pain: Secondary | ICD-10-CM

## 2023-02-04 DIAGNOSIS — Z7982 Long term (current) use of aspirin: Secondary | ICD-10-CM | POA: Insufficient documentation

## 2023-02-04 DIAGNOSIS — Z85528 Personal history of other malignant neoplasm of kidney: Secondary | ICD-10-CM | POA: Diagnosis not present

## 2023-02-04 DIAGNOSIS — Z87891 Personal history of nicotine dependence: Secondary | ICD-10-CM | POA: Insufficient documentation

## 2023-02-04 DIAGNOSIS — R519 Headache, unspecified: Secondary | ICD-10-CM | POA: Diagnosis not present

## 2023-02-04 DIAGNOSIS — N1832 Chronic kidney disease, stage 3b: Secondary | ICD-10-CM | POA: Insufficient documentation

## 2023-02-04 LAB — CBC WITH DIFFERENTIAL/PLATELET
Abs Immature Granulocytes: 0.03 10*3/uL (ref 0.00–0.07)
Basophils Absolute: 0 10*3/uL (ref 0.0–0.1)
Basophils Relative: 0 %
Eosinophils Absolute: 0.3 10*3/uL (ref 0.0–0.5)
Eosinophils Relative: 3 %
HCT: 48 % (ref 39.0–52.0)
Hemoglobin: 15.7 g/dL (ref 13.0–17.0)
Immature Granulocytes: 0 %
Lymphocytes Relative: 25 %
Lymphs Abs: 2.3 10*3/uL (ref 0.7–4.0)
MCH: 30.8 pg (ref 26.0–34.0)
MCHC: 32.7 g/dL (ref 30.0–36.0)
MCV: 94.1 fL (ref 80.0–100.0)
Monocytes Absolute: 0.6 10*3/uL (ref 0.1–1.0)
Monocytes Relative: 7 %
Neutro Abs: 6.2 10*3/uL (ref 1.7–7.7)
Neutrophils Relative %: 65 %
Platelets: 213 10*3/uL (ref 150–400)
RBC: 5.1 MIL/uL (ref 4.22–5.81)
RDW: 12.2 % (ref 11.5–15.5)
WBC: 9.5 10*3/uL (ref 4.0–10.5)
nRBC: 0 % (ref 0.0–0.2)

## 2023-02-04 LAB — I-STAT CHEM 8, ED
BUN: 36 mg/dL — ABNORMAL HIGH (ref 8–23)
Calcium, Ion: 1.12 mmol/L — ABNORMAL LOW (ref 1.15–1.40)
Chloride: 103 mmol/L (ref 98–111)
Creatinine, Ser: 2 mg/dL — ABNORMAL HIGH (ref 0.61–1.24)
Glucose, Bld: 155 mg/dL — ABNORMAL HIGH (ref 70–99)
HCT: 49 % (ref 39.0–52.0)
Hemoglobin: 16.7 g/dL (ref 13.0–17.0)
Potassium: 4.4 mmol/L (ref 3.5–5.1)
Sodium: 139 mmol/L (ref 135–145)
TCO2: 26 mmol/L (ref 22–32)

## 2023-02-04 LAB — I-STAT CG4 LACTIC ACID, ED
Lactic Acid, Venous: 2.3 mmol/L (ref 0.5–1.9)
Lactic Acid, Venous: 0.9 mmol/L (ref 0.5–1.9)

## 2023-02-04 LAB — COMPREHENSIVE METABOLIC PANEL
ALT: 15 U/L (ref 0–44)
AST: 23 U/L (ref 15–41)
Albumin: 4.5 g/dL (ref 3.5–5.0)
Alkaline Phosphatase: 111 U/L (ref 38–126)
Anion gap: 15 (ref 5–15)
BUN: 32 mg/dL — ABNORMAL HIGH (ref 8–23)
CO2: 24 mmol/L (ref 22–32)
Calcium: 9.6 mg/dL (ref 8.9–10.3)
Chloride: 100 mmol/L (ref 98–111)
Creatinine, Ser: 1.94 mg/dL — ABNORMAL HIGH (ref 0.61–1.24)
GFR, Estimated: 36 mL/min — ABNORMAL LOW (ref >60)
Glucose, Bld: 159 mg/dL — ABNORMAL HIGH (ref 70–99)
Potassium: 4.5 mmol/L (ref 3.5–5.1)
Sodium: 139 mmol/L (ref 135–145)
Total Bilirubin: 0.8 mg/dL (ref 0.3–1.2)
Total Protein: 7.5 g/dL (ref 6.5–8.1)

## 2023-02-04 LAB — URINALYSIS, ROUTINE W REFLEX MICROSCOPIC
Bilirubin Urine: NEGATIVE
Glucose, UA: NEGATIVE mg/dL
Hgb urine dipstick: NEGATIVE
Ketones, ur: NEGATIVE mg/dL
Leukocytes,Ua: NEGATIVE
Nitrite: NEGATIVE
Protein, ur: NEGATIVE mg/dL
Specific Gravity, Urine: 1.04 — ABNORMAL HIGH (ref 1.005–1.030)
pH: 5 (ref 5.0–8.0)

## 2023-02-04 LAB — PROTIME-INR
INR: 1.1 (ref 0.8–1.2)
Prothrombin Time: 13.9 s (ref 11.4–15.2)

## 2023-02-04 LAB — LIPASE, BLOOD: Lipase: 29 U/L (ref 11–51)

## 2023-02-04 LAB — TROPONIN I (HIGH SENSITIVITY)
Troponin I (High Sensitivity): 14 ng/L (ref <18)
Troponin I (High Sensitivity): 13 ng/L (ref <18)

## 2023-02-04 MED ORDER — SODIUM CHLORIDE 0.9 % IV BOLUS
1000.0000 mL | Freq: Once | INTRAVENOUS | Status: AC
  Administered 2023-02-04: 1000 mL via INTRAVENOUS

## 2023-02-04 MED ORDER — HYDROMORPHONE HCL 1 MG/ML IJ SOLN
0.5000 mg | Freq: Once | INTRAMUSCULAR | Status: AC
  Administered 2023-02-04: 0.5 mg via INTRAVENOUS
  Filled 2023-02-04: qty 1

## 2023-02-04 MED ORDER — IPRATROPIUM-ALBUTEROL 0.5-2.5 (3) MG/3ML IN SOLN
3.0000 mL | Freq: Once | RESPIRATORY_TRACT | Status: AC
  Administered 2023-02-04: 3 mL via RESPIRATORY_TRACT
  Filled 2023-02-04: qty 3

## 2023-02-04 MED ORDER — IOHEXOL 350 MG/ML SOLN
100.0000 mL | Freq: Once | INTRAVENOUS | Status: AC | PRN
  Administered 2023-02-04: 100 mL via INTRAVENOUS

## 2023-02-04 MED ORDER — ONDANSETRON HCL 4 MG/2ML IJ SOLN
4.0000 mg | Freq: Once | INTRAMUSCULAR | Status: AC
  Administered 2023-02-04: 4 mg via INTRAVENOUS
  Filled 2023-02-04: qty 2

## 2023-02-04 NOTE — ED Notes (Signed)
Patient transported to CT 

## 2023-02-04 NOTE — Discharge Instructions (Addendum)
We evaluated you for your abdominal pain.  Your testing in the emergency department is reassuring.  We did not find any specific cause of your symptoms.  We noticed on your CT scan that you have an aortic aneurysm.  Please follow-up with vascular surgery for this.  You can follow-up with Dr. Durwin Nora.  If you do have any new or worsening pain, any recurrent episodes of near fainting, or any other new symptoms such as recurrent pain, nausea or vomiting, difficulty breathing, or any other new symptoms.

## 2023-02-04 NOTE — ED Provider Notes (Signed)
[facility] DEPARTMENT AT [facility] Provider Note  CSN: [id number] Arrival date & time: 02/04/23 1726  Chief Complaint(s) Abdominal Pain  HPI Jonathan Wilson is a 72 y.o. male history of COPD, hyperlipidemia, hypertension presenting to the emergency department with abdominal pain.  Patient reports abdominal pain began this evening, suddenly.  Reports associated nausea, reports some shortness of breath.  Occurred while he was on the toilet.  Paramedics evaluated patient, found he was diaphoretic and pale, low blood pressure and bradycardic.  This all improved en route without intervention.  Patient reports his symptoms have significantly improved currently.  No syncope, no chest pain.  No vomiting.  Does report similar episodes in the past, reports had negative workup.   Past Medical History Past Medical History:  Diagnosis Date   Anxiety    Anxiety and depression 2000   worse after death of wife 03-30-2013   Depression    Diverticulosis of colon 2011   seen on CT scan in 2011.    Gout    Hepatitis C before 2000   Hyperlipidemia    Hypertension    Influenza A 06/2013   acute resp failure, did not require intubation.   Obesity    300 # in 2010, BMI 34 in 04/2013.    Pneumonia    recurrent episodes    Psychosis (HCC) 2000   s/e of treatment for Hepatitis C.     Renal cancer (HCC)    Right lower lobe lung mass 05/2008   resolved on follow up CT 2012.    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   Diarrhea 07/06/2020   SIRS (systemic inflammatory response syndrome) (HCC) 07/06/2020   COPD exacerbation (HCC) 11/20/2016   AKI (acute kidney injury) (HCC) 11/20/2016   Hyponatremia 11/20/2016   Syncope 09/19/2015   History of renal cell cancer 08/07/2015   Bipolar disorder (HCC) 06/28/2015   Lumbago 05/24/2015   Thrombocytopenia (HCC) 04/29/2015   Chest pain 04/29/2015   GAD (generalized anxiety disorder)    Substance induced mood disorder (HCC)    Cholecystitis, acute 01/13/2014   Choledocholithiasis 01/12/2014   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   Essential hypertension    Hyperlipidemia    Vitamin D deficiency    PSYCHOSIS 07/17/2010   TOBACCO ABUSE 07/17/2010   COPD (chronic obstructive pulmonary disease) (HCC) 02/24/2009   GOUT 02/07/2009   Hepatitis C 02/07/2009   PERSONAL HISTORY PNEUMONIA RECURRENT 01/30/2009   Home Medication(s) Prior to Admission medications   Medication Sig Start Date End Date Taking? Authorizing Provider  albuterol (VENTOLIN HFA) 108 (90 Base) MCG/ACT inhaler Inhale 2 puffs into the lungs every 6 (six) hours as needed for shortness of breath. 06/04/22   Lorin Glass, MD  amLODipine (NORVASC) 5 MG tablet Take 1 tablet (5 mg total) by mouth daily. 06/04/22 09/02/22  Lorin Glass, MD  ammonium lactate (AMLACTIN) 12 % lotion Apply 1 Application topically as needed for dry skin. 05/28/22  Candelaria Stagers, DPM  aspirin EC 81 MG tablet Take 81 mg by mouth daily.    [provider]  LINZESS 145 MCG CAPS capsule Take 145 mcg by mouth daily as needed (constipation). 12/10/21   [provider]  lisinopril (ZESTRIL) 10 MG tablet Take 1 tablet (10 mg total) by mouth daily. 06/04/22 09/02/22  Lorin Glass, MD  METHADONE HCL PO Take 90 mg by mouth daily.    [provider]  Multiple Vitamin (MULTIVITAMIN WITH MINERALS) TABS tablet Take 1 tablet by  mouth daily.    [provider]  PARoxetine (PAXIL) 40 MG tablet Take 1 tablet (40 mg total) by mouth daily. 06/04/22 07/04/22  Lorin Glass, MD                                                                                                                                    Past Surgical History Past Surgical History:  Procedure Laterality Date   CHOLECYSTECTOMY N/A 01/14/2014   Procedure: LAPAROSCOPIC CHOLECYSTECTOMY WITH INTRAOPERATIVE CHOLANGIOGRAM;  Surgeon: Kandis Cocking, MD;  Location: [facility];  Service: General;  Laterality: N/A;   MANDIBLE FRACTURE SURGERY     ROBOTIC ASSITED PARTIAL NEPHRECTOMY Right 08/07/2015   Procedure: XI ROBOTIC ASSISTED RIGHT PARTIAL NEPHRECTOMY;  Surgeon: Malen Gauze, MD;  Location: [facility];  Service: Urology;  Laterality: Right;   Family History Family History  Problem Relation Age of Onset   Diabetes Mother    Arthritis Mother    Cancer Father        pancreatic cancer    Social History Social History   Tobacco Use   Smoking status: Former    Current packs/day: 0.25    Average packs/day: 0.3 packs/day for 50.0 years (12.5 ttl pk-yrs)    Types: Cigarettes   Smokeless tobacco: Never   Tobacco comments:    Patient recently cut back on amount. Prior patient was smoking upwards of  2PPD leading to a 81pack per year history   Substance Use Topics   Alcohol use: No    Alcohol/week: 1.0 standard drink of alcohol    Types: 1 Cans of beer per week    Comment: hx of etoh abuse- 12 years ago    Drug use: No    Types: Heroin    Comment: last use 12 years ago    Allergies Patient has no known allergies.  Review of Systems Review of Systems  All other systems reviewed and are negative.   Physical Exam Vital Signs  I have reviewed the triage vital signs BP 118/68   Pulse (!) 51   Temp 98.2 F (36.8 C) (Oral)   Resp 15   Ht 6\' 2"  (1.88 m)   Wt 104.3 kg   SpO2 93%   BMI 29.53 kg/m  Physical Exam Vitals and nursing note  reviewed.  Constitutional:      General: He is not in acute  distress.    Appearance: Normal appearance.  HENT:     Mouth/Throat:     Mouth: Mucous membranes are moist.  Eyes:     Conjunctiva/sclera: Conjunctivae normal.  Cardiovascular:     Rate and Rhythm: Normal rate and regular rhythm.     Pulses:          Radial pulses are 2+ on the right side and 2+ on the left side.       Dorsalis pedis pulses are 2+ on the right side and 2+ on the left side.  Pulmonary:     Effort: Pulmonary effort is normal. No respiratory distress.     Breath sounds: Wheezing (trace) present.  Abdominal:     General: Abdomen is flat.     Palpations: Abdomen is soft.     Tenderness: There is generalized abdominal tenderness (mild diffuse).  Musculoskeletal:     Right lower leg: No edema.     Left lower leg: No edema.  Skin:    General: Skin is warm and dry.     Capillary Refill: Capillary refill takes less than 2 seconds.  Neurological:     Mental Status: He is alert and oriented to person, place, and time. Mental status is at baseline.  Psychiatric:        Mood and Affect: Mood normal.        Behavior: Behavior normal.     ED Results and Treatments Labs (all labs ordered are listed, but only abnormal results are displayed) Labs Reviewed  COMPREHENSIVE METABOLIC PANEL - Abnormal; Notable for the following components:      Result Value   Glucose, Bld 159 (*)    BUN 32 (*)    Creatinine, Ser 1.94 (*)    GFR, Estimated 36 (*)    All other components within normal limits  URINALYSIS, ROUTINE W REFLEX MICROSCOPIC - Abnormal; Notable for the following components:   Specific Gravity, Urine 1.040 (*)    All other components within normal limits  I-STAT CHEM 8, ED - Abnormal; Notable for the following components:   BUN 36 (*)    Creatinine, Ser 2.00 (*)    Glucose, Bld 155 (*)    Calcium, Ion 1.12 (*)    All other components within normal limits  I-STAT CG4 LACTIC ACID, ED - Abnormal; Notable for  the following components:   Lactic Acid, Venous 2.3 (*)    All other components within normal limits  CBC WITH DIFFERENTIAL/PLATELET  LIPASE, BLOOD  PROTIME-INR  I-STAT CG4 LACTIC ACID, ED  TROPONIN I (HIGH SENSITIVITY)  TROPONIN I (HIGH SENSITIVITY)                                                                                                                          Radiology CT Head Wo Contrast  Result Date: 02/04/2023 CLINICAL DATA:  Headache, sudden, severe EXAM: CT HEAD WITHOUT CONTRAST TECHNIQUE: Contiguous axial images were obtained from the base of the skull through the  vertex without intravenous contrast. RADIATION DOSE REDUCTION: This exam was performed according to the departmental dose-optimization program which includes automated exposure control, adjustment of the mA and/or kV according to patient size and/or use of iterative reconstruction technique. COMPARISON:  CT head 04/27/2022 FINDINGS: Brain: Patchy and confluent areas of decreased attenuation are noted throughout the deep and periventricular white matter of the cerebral hemispheres bilaterally, compatible with chronic microvascular ischemic disease. No evidence of large-territorial acute infarction. No parenchymal hemorrhage. No mass lesion. No extra-axial collection. No mass effect or midline shift. No hydrocephalus. Basilar cisterns are patent. Vascular: No hyperdense vessel. Atherosclerotic calcifications are present within the cavernous internal carotid arteries. Skull: No acute fracture or focal lesion. Sinuses/Orbits: Paranasal sinuses and mastoid air cells are clear. The orbits are unremarkable. Other: None. IMPRESSION: No acute intracranial abnormality. Electronically Signed   By: Tish Frederickson M.D.   On: 02/04/2023 19:56   CT Angio Chest/Abd/Pel for Dissection W and/or Wo Contrast  Result Date: 02/04/2023 CLINICAL DATA:  Chest and abdominal pain. Shortness of breath. Suspected acute aortic syndrome. EXAM: CT  ANGIOGRAPHY CHEST, ABDOMEN AND PELVIS TECHNIQUE: Non-contrast CT of the chest was initially obtained. Multidetector CT imaging through the chest, abdomen and pelvis was performed using the CTA protocol during bolus administration of intravenous contrast. Multiplanar reconstructed images and MIPs were obtained and reviewed to evaluate the vascular anatomy. RADIATION DOSE REDUCTION: This exam was performed according to the departmental dose-optimization program which includes automated exposure control, adjustment of the mA and/or kV according to patient size and/or use of iterative reconstruction technique. CONTRAST:  OMNIPAQUE IOHEXOL 350 MG/ML SOLN COMPARISON:  None Available. FINDINGS: CTA CHEST FINDINGS Cardiovascular: Aortic atherosclerotic calcification noted. No evidence of thoracic aortic dissection or aneurysm. No pulmonary embolism identified. Mediastinum/Nodes: No masses or pathologically enlarged lymph nodes identified. Lungs/Pleura: No pulmonary mass, infiltrate, or effusion. Musculoskeletal: No suspicious bone lesions identified. Review of the MIP images confirms the above findings. CTA ABDOMEN AND PELVIS FINDINGS VASCULAR Aorta: No evidence of abdominal aortic dissection. Infrarenal abdominal aortic aneurysm is seen measuring 3.3 cm in maximum diameter. No evidence of aneurysm leak or rupture. Atherosclerotic plaque noted within the abdominal aorta, with evidence of ulceration. Celiac: Patent without evidence of aneurysm, dissection, vasculitis or significant stenosis. SMA: Patent without evidence of aneurysm, dissection, vasculitis or significant stenosis. Renals: Both renal arteries are patent without evidence of aneurysm, dissection, vasculitis, fibromuscular dysplasia. Significant stenosis noted at the origin of the right renal artery. IMA: Patent without evidence of aneurysm, dissection, or vasculitis. Atherosclerotic plaque with significant stenosis noted at the origin. Inflow: Patent  without evidence of aneurysm, dissection, vasculitis or significant stenosis. Veins: No obvious venous abnormality within the limitations of this arterial phase study. Review of the MIP images confirms the above findings. NON-VASCULAR Hepatobiliary: No suspicious hepatic masses identified. Prior cholecystectomy. No evidence of biliary obstruction. Pancreas:  No mass or inflammatory changes. Spleen: Within normal limits in size and appearance. Adrenals/Urinary Tract: No suspicious masses identified. Stable scarring involving the upper pole of right kidney. No evidence of ureteral calculi or hydronephrosis. Stomach/Bowel: No evidence of obstruction, inflammatory process or abnormal fluid collections. Normal appendix visualized. Vascular/Lymphatic: No pathologically enlarged lymph nodes. No acute vascular findings. Reproductive:  No mass or other significant abnormality. Other:  None. Musculoskeletal:  No suspicious bone lesions identified. Review of the MIP images confirms the above findings. IMPRESSION: No evidence of thoracic or abdominal aortic dissection. 3.3 cm infrarenal abdominal aortic aneurysm, with ulcerated atherosclerotic plaque. No evidence of aneurysm  leak or rupture. Recommend follow-up ultrasound every 3 years. This recommendation follows ACR consensus guidelines: White Paper of the ACR Incidental Findings Committee II on Vascular Findings. J Am Coll Radiol 2013; 10:789-794. 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 Aortic Atherosclerosis (ICD10-I70.0). Electronically Signed   By: Danae Orleans M.D.   On: 02/04/2023 19:00    Pertinent labs & imaging results that were available during my care of the patient were reviewed by me and considered in my medical decision making (see MDM for details).  Medications Ordered in ED Medications  sodium chloride 0.9 % bolus 1,000 mL ( Intravenous Stopped 02/04/23 2014)  HYDROmorphone (DILAUDID) injection 0.5 mg  (0.5 mg Intravenous Given 02/04/23 1823)  ondansetron (ZOFRAN) injection 4 mg (4 mg Intravenous Given 02/04/23 1823)  iohexol (OMNIPAQUE) 350 MG/ML injection 100 mL (100 mLs Intravenous Contrast Given 02/04/23 1808)  ipratropium-albuterol (DUONEB) 0.5-2.5 (3) MG/3ML nebulizer solution 3 mL (3 mLs Nebulization Given 02/04/23 2135)                                                                                                                                     Procedures Procedures  (including critical care time)  Medical Decision Making / ED Course   MDM:  72 year old male presenting to the emergency department abdominal pain.  Patient well-appearing, vitals reassuring.  Sinus bradycardia.  Blood pressure here reassuring.  Exam with mild diffuse abdominal tenderness.  Unclear cause of symptoms, given acute onset with episode of low blood pressure, considered aortic pathology, obtain CT dissection study which did not show any sign of any acute aortic dissection, aneurysm rupture or leak.  Does have an infrarenal aortic aneurysm which has been present previously, radiology noted ulcerated plaque without signs of any rupture.  Will need to follow-up with vascular surgery.  Also considered other causes of acute abdominal pain such as obstruction, perforation, cholecystitis or pancreatitis, renal stone, CT imaging without sign of any acute issue.  Did have 1 transient episode of low oxygen which resolved after receiving a breathing treatment, patient does have COPD.  Suspect episode of low blood pressure bradycardia could be possibly a vagal event related to patient's pain, possibly defecation near syncope.  Also considered ACS but troponin negative x 2.  Initially had a mild elevated lactic acid which resolved after IV fluids, possibly related to episode of low blood pressure.  Patient did have a mild AKI, received IV fluids, advise follow-up with primary doctor for this.  Advise close follow-up with his  primary doctor, follow-up with vascular surgery for aortic aneurysm, strict return precautions. Will discharge patient to home. All questions answered. Patient comfortable with plan of discharge. Return precautions discussed with patient and specified on the after visit summary.       Additional history obtained: -Additional history obtained from ems -External records from outside source obtained and reviewed including: Chart review  including previous notes, labs, imaging, consultation notes including previous ER visits   Lab Tests: -I ordered, reviewed, and interpreted labs.   The pertinent results include:   Labs Reviewed  COMPREHENSIVE METABOLIC PANEL - Abnormal; Notable for the following components:      Result Value   Glucose, Bld 159 (*)    BUN 32 (*)    Creatinine, Ser 1.94 (*)    GFR, Estimated 36 (*)    All other components within normal limits  URINALYSIS, ROUTINE W REFLEX MICROSCOPIC - Abnormal; Notable for the following components:   Specific Gravity, Urine 1.040 (*)    All other components within normal limits  I-STAT CHEM 8, ED - Abnormal; Notable for the following components:   BUN 36 (*)    Creatinine, Ser 2.00 (*)    Glucose, Bld 155 (*)    Calcium, Ion 1.12 (*)    All other components within normal limits  I-STAT CG4 LACTIC ACID, ED - Abnormal; Notable for the following components:   Lactic Acid, Venous 2.3 (*)    All other components within normal limits  CBC WITH DIFFERENTIAL/PLATELET  LIPASE, BLOOD  PROTIME-INR  I-STAT CG4 LACTIC ACID, ED  TROPONIN I (HIGH SENSITIVITY)  TROPONIN I (HIGH SENSITIVITY)    Notable for mild AKI and lactic acidosis. Improved with fluids on re-check  EKG   EKG Interpretation Date/Time:  Tuesday February 04 2023 17:34:29 EDT Ventricular Rate:  56 PR Interval:  163 QRS Duration:  101 QT Interval:  464 QTC Calculation: 448 R Axis:   -74  Text Interpretation: Sinus rhythm LAD, consider left anterior fascicular block  Confirmed by Alvino Blood ([id number]) on 02/04/2023 5:38:23 PM         Imaging Studies ordered: I ordered imaging studies including CT dissection study, Ct head On my interpretation imaging demonstrates aortic aneurism without signs of rupture or dissection. Ulcerated plaque discussed with patient I independently visualized and interpreted imaging. I agree with the radiologist interpretation   Medicines ordered and prescription drug management: Meds ordered this encounter  Medications   sodium chloride 0.9 % bolus 1,000 mL   HYDROmorphone (DILAUDID) injection 0.5 mg   ondansetron (ZOFRAN) injection 4 mg   iohexol (OMNIPAQUE) 350 MG/ML injection 100 mL   ipratropium-albuterol (DUONEB) 0.5-2.5 (3) MG/3ML nebulizer solution 3 mL    -I have reviewed the patients home medicines and have made adjustments as needed    Cardiac Monitoring: The patient was maintained on a cardiac monitor.  I personally viewed and interpreted the cardiac monitored which showed an underlying rhythm of: sinus bradycardia  Social Determinants of Health:  Diagnosis or treatment significantly limited by social determinants of health: current smoker   Reevaluation: After the interventions noted above, I reevaluated the patient and found that their symptoms have resolved  Co morbidities that complicate the patient evaluation  Past Medical History:  Diagnosis Date   Anxiety    Anxiety and depression 2000   worse after death of wife 17-Mar-2013   Depression    Diverticulosis of colon 2011   seen on CT scan in 2011.    Gout    Hepatitis C before 2000   Hyperlipidemia    Hypertension    Influenza A 06/2013   acute resp failure, did not require intubation.   Obesity    300 # in 2010, BMI 34 in 04/2013.    Pneumonia    recurrent episodes    Psychosis (HCC) 2000   s/e of treatment for Hepatitis C.  Renal cancer (HCC)    Right lower lobe lung mass 05/2008   resolved on follow up CT 2012.    Vitamin D  deficiency       Dispostion: Disposition decision including need for hospitalization was considered, and patient discharged from emergency department.    Final Clinical Impression(s) / ED Diagnoses Final diagnoses:  Generalized abdominal pain  Infrarenal abdominal aortic aneurysm (AAA) without rupture (HCC)     This chart was dictated using voice recognition software.  Despite best efforts to proofread,  errors can occur which can change the documentation meaning.    Lonell Grandchild, MD 02/05/23 [phone]

## 2023-02-04 NOTE — ED Triage Notes (Signed)
BIB EMS from home, pt had sudden onset of abdominal pain with SOB that comes and goes. Pt was sitting on the toilet when EMS found him. C/o dizziness and headache. Pain 6/10.   Hx of COPD, Cholecystomy

## 2023-02-04 NOTE — Telephone Encounter (Signed)
PT missed his appt w/Ms. Clent Ridges today. He will resched but he is req a refill on his Albuterol and Stiolto Respimat- take 2 puffs every morning x 2 weeks   Pharm: [facility] on [address]

## 2023-02-06 MED ORDER — ALBUTEROL SULFATE HFA 108 (90 BASE) MCG/ACT IN AERS: 2.0000 | INHALATION_SPRAY | Freq: Four times a day (QID) | RESPIRATORY_TRACT | 0 refills | Status: DC | PRN

## 2023-02-06 MED ORDER — TIOTROPIUM BROMIDE-OLODATEROL 2.5-2.5 MCG/ACT IN AERS: 2.0000 | INHALATION_SPRAY | Freq: Every day | RESPIRATORY_TRACT | 0 refills | Status: DC

## 2023-02-06 NOTE — Telephone Encounter (Signed)
Stiolto and albuterol refill sent.

## 2023-02-11 ENCOUNTER — Encounter: Payer: Self-pay | Admitting: Primary Care

## 2023-02-11 ENCOUNTER — Ambulatory Visit (INDEPENDENT_AMBULATORY_CARE_PROVIDER_SITE_OTHER): Payer: 59 | Admitting: Primary Care

## 2023-02-11 VITALS — BP 106/58 | HR 67 | Temp 98.8°F | Ht 74.0 in | Wt 226.0 lb

## 2023-02-11 DIAGNOSIS — I10 Essential (primary) hypertension: Secondary | ICD-10-CM | POA: Diagnosis not present

## 2023-02-11 DIAGNOSIS — F17200 Nicotine dependence, unspecified, uncomplicated: Secondary | ICD-10-CM | POA: Diagnosis not present

## 2023-02-11 MED ORDER — TIOTROPIUM BROMIDE-OLODATEROL 2.5-2.5 MCG/ACT IN AERS: 2.0000 | INHALATION_SPRAY | Freq: Every day | RESPIRATORY_TRACT | 11 refills | Status: DC

## 2023-02-11 MED ORDER — ALBUTEROL SULFATE HFA 108 (90 BASE) MCG/ACT IN AERS: 2.0000 | INHALATION_SPRAY | Freq: Four times a day (QID) | RESPIRATORY_TRACT | 3 refills | Status: DC | PRN

## 2023-02-11 NOTE — Assessment & Plan Note (Addendum)
-   Stable; BP 106/58 - Continue Amlodipine 5mg  daily and lisinopril 10mg  dailuy  - Advised patient hold BP medication if SBP <90 - Follow up with family medicine this week

## 2023-02-11 NOTE — Patient Instructions (Addendum)
-   Keep apt with new family doctor on Thursday, make sure you address BP medications and Linzess (you might be on too much medication) - Hold if SBP <90 (top number) - Resume Stiolto take two puffs morning (I did not have samples, if Rx is too expensive let us know) - Use Albuterol 2 pudds every 4-6 hours for breakthrough shortness of breath  Follow-up: 6 months with Waynetta Sandy NP

## 2023-02-11 NOTE — Progress Notes (Signed)
@Patient  ID: Jonathan Wilson, male    DOB: 1950/10/09, 72 y.o.   MRN: [id number]  Chief Complaint  Patient presents with   Follow-up    Increased SOB over the past wk. He has been out of Stiolto past 6 wks. He has some chest discomfort when takes a deep breath.     Referring provider: Maricruz Lucero Palau, FNP  HPI: 72 year old smoker admitted 2/27 to 3/1 for nausea, vomiting, diarrhea and hypotension with AKI .  VBG showed hypercarbia (7.19/90 )and placed on BiPAP CT abdomen/pelvis showed diffuse enteritis and 3.7 cm infrarenal AAA He was weaned off BiPAP and presents for outpatient follow-up today Bicarbonate on discharge 3/1 was 25  PMH - HTN, HLD, COPD (1 PPD smoker), RLL lung mass (resolved on f/u CT 2012), hepatitis C and EtOH related cirrhosis, renal CA status post partial right nephrectomy, diverticulosis, anxiety/depression.  Chronic opiate use on methadone 120mg  daily.  Smoked > 1 PPD x 50 yrs  He states he can walk about a mile without being short of breath denies wheezing or frequent chest colds.  An albuterol inhaler sent about a month He was able to quit for a few days but now has started smoking again back up to a pack a day I have reviewed discharge summary from hospital stay, imaging and labs  Chest x-ray 2/27 showed mild hyperinflation, no infiltrates. CT abdomen/pelvis 07/30/2021 showed 3.7 cm infrarenal AAA  03/25/2022 Patient presents today for 71 month follow-up.  Breathing has been worse last couple of weeks. He gets out of breath with exertion. No acute or chronic cough. He is a current smoker, he has cut back to 1 pack a week. He is not on any maintenance inhalers. He has no PFTs on file. He would like something to help him breath better. He uses albuterol 3-4 times a day. Denies chest tightness, wheezing.  02/11/2023- Interim hx  Patient presents today for overdue follow-up COPD. He was seen in ED on 02/04/23 for abdominal pain, reports his blood pressure was low at  that time. He is feeling back to his baseline. He needs refill of his inhalers, ran out several months ago. He did notice an improvement in his breathing while taking Stiolto daily. He reports chest congestion and cough are gone. He does as much physically as possible.     No Known Allergies  Immunization History  Administered Date(s) Administered   Fluad Quad(high Dose 65+) 02/02/2023   Hep A / Hep B 09/24/2013   Influenza Whole 03/07/2009   Influenza, High Dose Seasonal PF 06/08/2018, 06/29/2020, 03/05/2021   Influenza, Seasonal, Injecte, Preservative Fre 05/24/2015   Influenza,inj,Quad PF,6+ Mos 05/24/2015, 03/16/2019   Influenza-Unspecified 05/24/2015   Moderna Sars-Covid-2 Vaccination 05/07/2020   PFIZER(Purple Top)SARS-COV-2 Vaccination 08/02/2019, 08/23/2019, 03/05/2021   Pneumococcal Polysaccharide-23 01/13/2014   Td 06/03/2000   Td (Adult), 2 Lf Tetanus Toxid, Preservative Free 06/03/2000   Tdap 06/04/2013    Past Medical History:  Diagnosis Date   Anxiety    Anxiety and depression 2000   worse after death of wife 03/20/2013   Depression    Diverticulosis of colon 2011   seen on CT scan in 2011.    Gout    Hepatitis C before 2000   Hyperlipidemia    Hypertension    Influenza A 06/2013   acute resp failure, did not require intubation.   Obesity    300 # in 2010, BMI 34 in 04/2013.    Pneumonia    recurrent episodes  Psychosis (HCC) 2000   s/e of treatment for Hepatitis C.     Renal cancer (HCC)    Right lower lobe lung mass 05/2008   resolved on follow up CT 2012.    Vitamin D deficiency     Tobacco History: Social History   Tobacco Use  Smoking Status Every Day   Current packs/day: 2.00   Average packs/day: 2.0 packs/day for 59.7 years (119.4 ttl pk-yrs)   Types: Cigarettes   Start date: 06/04/1963  Smokeless Tobacco Never  Tobacco Comments   2 cigs per day 02/11/23    Ready to quit: Not Answered Counseling given: Not Answered Tobacco comments: 2  cigs per day 02/11/23    Outpatient Medications Prior to Visit  Medication Sig Dispense Refill   amLODipine (NORVASC) 5 MG tablet Take 1 tablet (5 mg total) by mouth daily. 30 tablet 2   ammonium lactate (AMLACTIN) 12 % lotion Apply 1 Application topically as needed for dry skin. 400 g 0   aspirin EC 81 MG tablet Take 81 mg by mouth daily.     lisinopril (ZESTRIL) 10 MG tablet Take 1 tablet (10 mg total) by mouth daily. 30 tablet 2   METHADONE HCL PO Take 90 mg by mouth daily.     Multiple Vitamin (MULTIVITAMIN WITH MINERALS) TABS tablet Take 1 tablet by mouth daily.     PARoxetine (PAXIL) 40 MG tablet Take 1 tablet (40 mg total) by mouth daily. 30 tablet 0   LINZESS 145 MCG CAPS capsule Take 145 mcg by mouth daily as needed (constipation). (Patient not taking: Reported on 02/11/2023)     albuterol (VENTOLIN HFA) 108 (90 Base) MCG/ACT inhaler Inhale 2 puffs into the lungs every 6 (six) hours as needed for shortness of breath. (Patient not taking: Reported on 02/11/2023) 6.7 g 0   Tiotropium Bromide-Olodaterol 2.5-2.5 MCG/ACT AERS Inhale 2 puffs into the lungs daily. (Patient not taking: Reported on 02/11/2023) 4 g 0   No facility-administered medications prior to visit.   Review of Systems  Review of Systems  Constitutional: Negative.   HENT: Negative.    Respiratory: Negative.  Negative for cough, shortness of breath and wheezing.        DOE  Cardiovascular: Negative.    Physical Exam  BP (!) 106/58 (BP Location: Left Arm, Cuff Size: Normal)   Pulse 67   Temp 98.8 F (37.1 C) (Oral)   Ht 6\' 2"  (1.88 m)   Wt 226 lb (102.5 kg)   SpO2 98% Comment: on RA  BMI 29.02 kg/m  Physical Exam Constitutional:      Appearance: Normal appearance.  HENT:     Head: Normocephalic and atraumatic.  Cardiovascular:     Rate and Rhythm: Normal rate and regular rhythm.  Pulmonary:     Effort: Pulmonary effort is normal. No respiratory distress.     Breath sounds: Normal breath sounds. No  rhonchi or rales.     Comments: Dull wheeze Skin:    General: Skin is warm and dry.  Neurological:     General: No focal deficit present.     Mental Status: He is alert and oriented to person, place, and time. Mental status is at baseline.  Psychiatric:        Mood and Affect: Mood normal.        Behavior: Behavior normal.        Thought Content: Thought content normal.        Judgment: Judgment normal.  Lab Results:  CBC    Component Value Date/Time   WBC 9.5 02/04/2023 1744   RBC 5.10 02/04/2023 1744   HGB 16.7 02/04/2023 1751   HCT 49.0 02/04/2023 1751   PLT 213 02/04/2023 1744   MCV 94.1 02/04/2023 1744   MCH 30.8 02/04/2023 1744   MCHC 32.7 02/04/2023 1744   RDW 12.2 02/04/2023 1744   LYMPHSABS 2.3 02/04/2023 1744   MONOABS 0.6 02/04/2023 1744   EOSABS 0.3 02/04/2023 1744   BASOSABS 0.0 02/04/2023 1744    BMET    Component Value Date/Time   NA 139 02/04/2023 1751   K 4.4 02/04/2023 1751   CL 103 02/04/2023 1751   CO2 24 02/04/2023 1744   GLUCOSE 155 (H) 02/04/2023 1751   BUN 36 (H) 02/04/2023 1751   CREATININE 2.00 (H) 02/04/2023 1751   CREATININE 0.98 07/16/2013 1038   CALCIUM 9.6 02/04/2023 1744   GFRNONAA 36 (L) 02/04/2023 1744   GFRAA 52 (L) 01/22/2019 1802    BNP    Component Value Date/Time   BNP 63.3 05/31/2022 1936    ProBNP    Component Value Date/Time   PROBNP 2,813.0 (H) 06/21/2013 0908    Imaging: CT Head Wo Contrast  Result Date: 02/04/2023 CLINICAL DATA:  Headache, sudden, severe EXAM: CT HEAD WITHOUT CONTRAST TECHNIQUE: Contiguous axial images were obtained from the base of the skull through the vertex without intravenous contrast. RADIATION DOSE REDUCTION: This exam was performed according to the departmental dose-optimization program which includes automated exposure control, adjustment of the mA and/or kV according to patient size and/or use of iterative reconstruction technique. COMPARISON:  CT head 04/27/2022 FINDINGS:  Brain: Patchy and confluent areas of decreased attenuation are noted throughout the deep and periventricular white matter of the cerebral hemispheres bilaterally, compatible with chronic microvascular ischemic disease. No evidence of large-territorial acute infarction. No parenchymal hemorrhage. No mass lesion. No extra-axial collection. No mass effect or midline shift. No hydrocephalus. Basilar cisterns are patent. Vascular: No hyperdense vessel. Atherosclerotic calcifications are present within the cavernous internal carotid arteries. Skull: No acute fracture or focal lesion. Sinuses/Orbits: Paranasal sinuses and mastoid air cells are clear. The orbits are unremarkable. Other: None. IMPRESSION: No acute intracranial abnormality. Electronically Signed   By: Tish Frederickson M.D.   On: 02/04/2023 19:56   CT Angio Chest/Abd/Pel for Dissection W and/or Wo Contrast  Result Date: 02/04/2023 CLINICAL DATA:  Chest and abdominal pain. Shortness of breath. Suspected acute aortic syndrome. EXAM: CT ANGIOGRAPHY CHEST, ABDOMEN AND PELVIS TECHNIQUE: Non-contrast CT of the chest was initially obtained. Multidetector CT imaging through the chest, abdomen and pelvis was performed using the CTA protocol during bolus administration of intravenous contrast. Multiplanar reconstructed images and MIPs were obtained and reviewed to evaluate the vascular anatomy. RADIATION DOSE REDUCTION: This exam was performed according to the departmental dose-optimization program which includes automated exposure control, adjustment of the mA and/or kV according to patient size and/or use of iterative reconstruction technique. CONTRAST:  OMNIPAQUE IOHEXOL 350 MG/ML SOLN COMPARISON:  None Available. FINDINGS: CTA CHEST FINDINGS Cardiovascular: Aortic atherosclerotic calcification noted. No evidence of thoracic aortic dissection or aneurysm. No pulmonary embolism identified. Mediastinum/Nodes: No masses or pathologically enlarged lymph nodes  identified. Lungs/Pleura: No pulmonary mass, infiltrate, or effusion. Musculoskeletal: No suspicious bone lesions identified. Review of the MIP images confirms the above findings. CTA ABDOMEN AND PELVIS FINDINGS VASCULAR Aorta: No evidence of abdominal aortic dissection. Infrarenal abdominal aortic aneurysm is seen measuring 3.3 cm in maximum diameter. No evidence  of aneurysm leak or rupture. Atherosclerotic plaque noted within the abdominal aorta, with evidence of ulceration. Celiac: Patent without evidence of aneurysm, dissection, vasculitis or significant stenosis. SMA: Patent without evidence of aneurysm, dissection, vasculitis or significant stenosis. Renals: Both renal arteries are patent without evidence of aneurysm, dissection, vasculitis, fibromuscular dysplasia. Significant stenosis noted at the origin of the right renal artery. IMA: Patent without evidence of aneurysm, dissection, or vasculitis. Atherosclerotic plaque with significant stenosis noted at the origin. Inflow: Patent without evidence of aneurysm, dissection, vasculitis or significant stenosis. Veins: No obvious venous abnormality within the limitations of this arterial phase study. Review of the MIP images confirms the above findings. NON-VASCULAR Hepatobiliary: No suspicious hepatic masses identified. Prior cholecystectomy. No evidence of biliary obstruction. Pancreas:  No mass or inflammatory changes. Spleen: Within normal limits in size and appearance. Adrenals/Urinary Tract: No suspicious masses identified. Stable scarring involving the upper pole of right kidney. No evidence of ureteral calculi or hydronephrosis. Stomach/Bowel: No evidence of obstruction, inflammatory process or abnormal fluid collections. Normal appendix visualized. Vascular/Lymphatic: No pathologically enlarged lymph nodes. No acute vascular findings. Reproductive:  No mass or other significant abnormality. Other:  None. Musculoskeletal:  No suspicious bone lesions  identified. Review of the MIP images confirms the above findings. IMPRESSION: No evidence of thoracic or abdominal aortic dissection. 3.3 cm infrarenal abdominal aortic aneurysm, with ulcerated atherosclerotic plaque. No evidence of aneurysm leak or rupture. Recommend follow-up ultrasound every 3 years. This recommendation follows ACR consensus guidelines: White Paper of the ACR Incidental Findings Committee II on Vascular Findings. J Am Coll Radiol 2013; 10:789-794. 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 Aortic Atherosclerosis (ICD10-I70.0). Electronically Signed   By: Danae Orleans M.D.   On: 02/04/2023 19:00     Assessment & Plan:   COPD (chronic obstructive pulmonary disease) (HCC) - Resume Stiolto take two puffs daily every morning  - Use Albuterol 2 puffs every 4-6 hours for breakthrough shortness of breath/wheezing  - Up to date with flu vaccine, recommend he get one time RSV vaccine through his pharmacy  - Follow-up 6 months with Waynetta Sandy NP   Essential hypertension - Stable; BP 106/58 - Continue Amlodipine 5mg  daily and lisinopril 10mg  dailuy  - Advised patient hold BP medication if SBP <90 - Follow up with family medicine this week   TOBACCO ABUSE - Current smoker. Following with lung cancer screening program - LDCT in July 2024 showed LUNG RADS 3, needs follow up imaging in 6 months (due January 2025)   Glenford Bayley, NP 02/11/2023

## 2023-02-11 NOTE — Assessment & Plan Note (Signed)
-   Current smoker. Following with lung cancer screening program - LDCT in July 2024 showed LUNG RADS 3, needs follow up imaging in 6 months (due January 2025)

## 2023-02-11 NOTE — Assessment & Plan Note (Addendum)
-   Resume Stiolto take two puffs daily every morning  - Use Albuterol 2 puffs every 4-6 hours for breakthrough shortness of breath/wheezing  - Up to date with flu vaccine, recommend he get one time RSV vaccine through his pharmacy  - Follow-up 6 months with Waynetta Sandy NP

## 2023-02-17 ENCOUNTER — Encounter: Payer: Self-pay | Admitting: Primary Care

## 2023-03-04 NOTE — Progress Notes (Signed)
Patient ID: Jonathan Wilson, male   DOB: [birth year], 72 y.o.   MRN: [id number]  Reason for Consult: No chief complaint on file.   Referred by Elizabeth Palau, FNP  Subjective:     HPI  Jonathan Wilson is a 72 y.o. male with a past medical history of hypertension, hyperlipidemia, hepatitis C and COPD who presents to clinic for evaluation of 3.3 cm infrarenal AAA noted on CTA chest pelvis from 02/04/2023 when he presented to the ED with chest and epigastric abdominal pain.  He denies any new or unusual abdominal or back pain since the episode.    He is a current smoker.  He is compliant with aspirin, not prescribed statin.  Past Medical History:  Diagnosis Date   Anxiety    Anxiety and depression 2000   worse after death of wife Apr 03, 2013   Depression    Diverticulosis of colon 2011   seen on CT scan in 2011.    Gout    Hepatitis C before 2000   Hyperlipidemia    Hypertension    Influenza A 06/2013   acute resp failure, did not require intubation.   Obesity    300 # in 2010, BMI 34 in 04/2013.    Pneumonia    recurrent episodes    Psychosis (HCC) 2000   s/e of treatment for Hepatitis C.     Renal cancer (HCC)    Right lower lobe lung mass 05/2008   resolved on follow up CT 2012.    Vitamin D deficiency    Family History  Problem Relation Age of Onset   Diabetes Mother    Arthritis Mother    Cancer Father        pancreatic cancer   Past Surgical History:  Procedure Laterality Date   CHOLECYSTECTOMY N/A 01/14/2014   Procedure: LAPAROSCOPIC CHOLECYSTECTOMY WITH INTRAOPERATIVE CHOLANGIOGRAM;  Surgeon: Kandis Cocking, MD;  Location: [facility];  Service: General;  Laterality: N/A;   MANDIBLE FRACTURE SURGERY     ROBOTIC ASSITED PARTIAL NEPHRECTOMY Right 08/07/2015   Procedure: XI ROBOTIC ASSISTED RIGHT PARTIAL NEPHRECTOMY;  Surgeon: Malen Gauze, MD;  Location: [facility];  Service: Urology;  Laterality: Right;    Short Social History:  Social History   Tobacco Use   Smoking  status: Every Day    Current packs/day: 2.00    Average packs/day: 2.0 packs/day for 59.7 years (119.5 ttl pk-yrs)    Types: Cigarettes    Start date: 06/04/1963   Smokeless tobacco: Never   Tobacco comments:    2 cigs per day 02/11/23   Substance Use Topics   Alcohol use: No    Alcohol/week: 1.0 standard drink of alcohol    Types: 1 Cans of beer per week    Comment: hx of etoh abuse- 12 years ago     No Known Allergies  Current Outpatient Medications  Medication Sig Dispense Refill   albuterol (VENTOLIN HFA) 108 (90 Base) MCG/ACT inhaler Inhale 2 puffs into the lungs every 6 (six) hours as needed for shortness of breath. 6.7 g 3   amLODipine (NORVASC) 5 MG tablet Take 1 tablet (5 mg total) by mouth daily. 30 tablet 2   ammonium lactate (AMLACTIN) 12 % lotion Apply 1 Application topically as needed for dry skin. 400 g 0   aspirin EC 81 MG tablet Take 81 mg by mouth daily.     LINZESS 145 MCG CAPS capsule Take 145 mcg by mouth daily as needed (constipation). (Patient  not taking: Reported on 02/11/2023)     lisinopril (ZESTRIL) 10 MG tablet Take 1 tablet (10 mg total) by mouth daily. 30 tablet 2   METHADONE HCL PO Take 90 mg by mouth daily.     Multiple Vitamin (MULTIVITAMIN WITH MINERALS) TABS tablet Take 1 tablet by mouth daily.     PARoxetine (PAXIL) 40 MG tablet Take 1 tablet (40 mg total) by mouth daily. 30 tablet 0   Tiotropium Bromide-Olodaterol 2.5-2.5 MCG/ACT AERS Inhale 2 puffs into the lungs daily. 4 g 11   No current facility-administered medications for this visit.    REVIEW OF SYSTEMS      Objective:  Objective   There were no vitals filed for this visit. There is no height or weight on file to calculate BMI.  Physical Exam General: no acute distress Cardiac: hemodynamically stable, nontachycardic Pulm: normal work of breathing GI: non-tender, no pulsatile mass  Neuro: alert, no focal deficit Extremities: No cyanosis or wounds Vascular: palpable femoral and  DP bilaterally   Data: CTA independently reviewed: 3.3 cm infrarenal abdominal aortic aneurysm.  Widely patent common and external iliacs bilaterally Mild posterior atherosclerotic disease of the visceral aorta with mild ectasia  Creatinine on last labs 1.9 (02/04/2023)     Assessment/Plan:     Jonathan Wilson is a 72 y.o. male with a history of hypertension, hyperlipidemia, hepatitis C, CKD (cr. 1.9) and COPD who presents with asymptomatic small, 3.3 cm, infrarenal abdominal aortic aneurysm.   Plan to continue surveillance according to the SVS guidelines with a aortic duplex in 3 years. On aspirin Will prescribe statin  SVS AAA Surveillance Guidelines  < 3.0 cm  10 years 3.0 - 3.9 cm  3 years 4.0 - 4.9 cm  1 year 4.9 - 5.4 cm  6 months  Repair if > 5.4 or growth of > 1.0cm/yr or > 0.5cm/85month Consider repair for women > 5.0cm     Daria Pastures MD [facility]

## 2023-03-07 ENCOUNTER — Ambulatory Visit (INDEPENDENT_AMBULATORY_CARE_PROVIDER_SITE_OTHER): Payer: 59 | Admitting: Vascular Surgery

## 2023-03-07 VITALS — BP 128/79 | HR 55 | Temp 98.2°F | Ht 75.0 in | Wt 231.0 lb

## 2023-03-07 DIAGNOSIS — I7143 Infrarenal abdominal aortic aneurysm, without rupture: Secondary | ICD-10-CM

## 2023-03-07 MED ORDER — ATORVASTATIN CALCIUM 40 MG PO TABS: 40.0000 mg | ORAL_TABLET | Freq: Every day | ORAL | 3 refills | Status: AC

## 2023-03-24 ENCOUNTER — Other Ambulatory Visit: Payer: Self-pay

## 2023-04-16 ENCOUNTER — Ambulatory Visit: Payer: 59 | Admitting: Family

## 2023-04-23 ENCOUNTER — Ambulatory Visit: Payer: 59 | Admitting: Family

## 2023-05-12 ENCOUNTER — Ambulatory Visit: Payer: 59 | Admitting: Podiatry

## 2023-06-26 ENCOUNTER — Inpatient Hospital Stay: Admission: RE | Admit: 2023-06-26 | Payer: 59 | Source: Ambulatory Visit

## 2023-07-18 ENCOUNTER — Other Ambulatory Visit (HOSPITAL_BASED_OUTPATIENT_CLINIC_OR_DEPARTMENT_OTHER): Payer: Self-pay

## 2023-08-10 ENCOUNTER — Other Ambulatory Visit: Payer: Self-pay | Admitting: Primary Care

## 2023-08-11 ENCOUNTER — Encounter: Payer: Self-pay | Admitting: Primary Care

## 2023-08-11 ENCOUNTER — Ambulatory Visit: Payer: 59 | Admitting: Primary Care

## 2023-10-24 LAB — LAB REPORT - SCANNED
Calcium: 9.5
EGFR: 43
HM Hepatitis Screen: POSITIVE
PSA, Total: 0.48
PTH: 131

## 2024-01-21 ENCOUNTER — Emergency Department (HOSPITAL_COMMUNITY)
Admission: EM | Admit: 2024-01-21 | Discharge: 2024-01-21 | Disposition: A | Discharge status: Home / Self Care | Attending: Emergency Medicine | Admitting: Emergency Medicine

## 2024-01-21 ENCOUNTER — Encounter (HOSPITAL_COMMUNITY): Payer: Self-pay

## 2024-01-21 ENCOUNTER — Other Ambulatory Visit: Payer: Self-pay

## 2024-01-21 ENCOUNTER — Emergency Department (HOSPITAL_COMMUNITY)

## 2024-01-21 DIAGNOSIS — R0602 Shortness of breath: Secondary | ICD-10-CM | POA: Diagnosis present

## 2024-01-21 DIAGNOSIS — Z79899 Other long term (current) drug therapy: Secondary | ICD-10-CM | POA: Insufficient documentation

## 2024-01-21 DIAGNOSIS — Z7982 Long term (current) use of aspirin: Secondary | ICD-10-CM | POA: Insufficient documentation

## 2024-01-21 DIAGNOSIS — Z7951 Long term (current) use of inhaled steroids: Secondary | ICD-10-CM | POA: Diagnosis not present

## 2024-01-21 DIAGNOSIS — J441 Chronic obstructive pulmonary disease with (acute) exacerbation: Secondary | ICD-10-CM | POA: Insufficient documentation

## 2024-01-21 DIAGNOSIS — J189 Pneumonia, unspecified organism: Secondary | ICD-10-CM | POA: Diagnosis not present

## 2024-01-21 LAB — CBC
HCT: 43.4 % (ref 39.0–52.0)
Hemoglobin: 14.5 g/dL (ref 13.0–17.0)
MCH: 31.9 pg (ref 26.0–34.0)
MCHC: 33.4 g/dL (ref 30.0–36.0)
MCV: 95.6 fL (ref 80.0–100.0)
Platelets: 178 K/uL (ref 150–400)
RBC: 4.54 MIL/uL (ref 4.22–5.81)
RDW: 12.5 % (ref 11.5–15.5)
WBC: 6.5 K/uL (ref 4.0–10.5)
nRBC: 0 % (ref 0.0–0.2)

## 2024-01-21 LAB — BASIC METABOLIC PANEL WITH GFR
Anion gap: 8 (ref 5–15)
BUN: 11 mg/dL (ref 8–23)
CO2: 29 mmol/L (ref 22–32)
Calcium: 9.2 mg/dL (ref 8.9–10.3)
Chloride: 100 mmol/L (ref 98–111)
Creatinine, Ser: 1.23 mg/dL (ref 0.61–1.24)
GFR, Estimated: >60 mL/min (ref >60)
Glucose, Bld: 101 mg/dL — ABNORMAL HIGH (ref 70–99)
Potassium: 4.3 mmol/L (ref 3.5–5.1)
Sodium: 137 mmol/L (ref 135–145)

## 2024-01-21 MED ORDER — PREDNISONE 20 MG PO TABS: 40.0000 mg | ORAL_TABLET | Freq: Every day | ORAL | 0 refills | Status: AC

## 2024-01-21 MED ORDER — IPRATROPIUM-ALBUTEROL 0.5-2.5 (3) MG/3ML IN SOLN
3.0000 mL | RESPIRATORY_TRACT | Status: AC
  Administered 2024-01-21 (×3): 3 mL via RESPIRATORY_TRACT
  Filled 2024-01-21: qty 9

## 2024-01-21 MED ORDER — DOXYCYCLINE HYCLATE 100 MG PO CAPS: 100.0000 mg | ORAL_CAPSULE | Freq: Two times a day (BID) | ORAL | 0 refills | Status: DC

## 2024-01-21 MED ORDER — AZITHROMYCIN 500 MG PO TABS: 500.0000 mg | ORAL_TABLET | Freq: Every day | ORAL | 0 refills | Status: DC

## 2024-01-21 MED ORDER — ALBUTEROL SULFATE HFA 108 (90 BASE) MCG/ACT IN AERS: 1.0000 | INHALATION_SPRAY | Freq: Four times a day (QID) | RESPIRATORY_TRACT | 0 refills | Status: AC | PRN

## 2024-01-21 MED ORDER — METHYLPREDNISOLONE SODIUM SUCC 125 MG IJ SOLR
125.0000 mg | Freq: Once | INTRAMUSCULAR | Status: AC
  Administered 2024-01-21: 125 mg via INTRAVENOUS
  Filled 2024-01-21: qty 2

## 2024-01-21 MED ORDER — AZITHROMYCIN 250 MG PO TABS
500.0000 mg | ORAL_TABLET | Freq: Once | ORAL | Status: AC
  Administered 2024-01-21: 500 mg via ORAL
  Filled 2024-01-21: qty 2

## 2024-01-21 NOTE — ED Provider Notes (Signed)
 [facility]  EMERGENCY DEPARTMENT AT [facility] Provider Note   CSN: [id number] Arrival date & time: 01/21/24  1158     Patient presents with: Shortness of Breath   Jonathan Wilson is a 73 y.o. male with history of COPD who presents to the emergency department today for further evaluation of shortness of breath which started yesterday.  Patient states that he woke up yesterday morning and was unable to walk to the bathroom without feeling short of breath.  He states this is abnormal for him and he can typically perform his ADLs without difficulty.  He may occasionally get shortness of breath but not this severe.  Patient does state that he ran out of some of his COPD medicines.  He does endorse some coughing but without increased productivity. Denies chest tightness or pain. No fever or chills. Patient is not on any oxygen at home.      Shortness of Breath      Prior to Admission medications   Medication Sig Start Date End Date Taking? Authorizing Provider  albuterol  (VENTOLIN  HFA) 108 (90 Base) MCG/ACT inhaler Inhale 1-2 puffs into the lungs every 6 (six) hours as needed for wheezing or shortness of breath. 01/21/24  Yes Theotis, Sophee Mckimmy M, PA-C  doxycycline  (VIBRAMYCIN ) 100 MG capsule Take 1 capsule (100 mg total) by mouth 2 (two) times daily. 01/21/24  Yes Theotis, Corbet Hanley M, PA-C  predniSONE  (DELTASONE ) 20 MG tablet Take 2 tablets (40 mg total) by mouth daily. 01/21/24  Yes Theotis, Trinisha Paget M, PA-C  albuterol  (VENTOLIN  HFA) 108 (90 Base) MCG/ACT inhaler INHALE 2 PUFFS BY MOUTH EVERY 6 HOURS AS NEEDED FOR SHORTNESS OF BREATH 08/11/23   Hope Almarie ORN, NP  amLODipine  (NORVASC ) 5 MG tablet Take 1 tablet (5 mg total) by mouth daily. 06/04/22 02/11/23  Arlice Reichert, MD  ammonium lactate  (AMLACTIN) 12 % lotion Apply 1 Application topically as needed for dry skin. 05/28/22   Tobie Franky SQUIBB, DPM  aspirin  EC 81 MG tablet Take 81 mg by mouth daily.    [provider]  atorvastatin   (LIPITOR) 40 MG tablet Take 1 tablet (40 mg total) by mouth daily. 03/07/23   Pearline Norman RAMAN, MD  LINZESS  145 MCG CAPS capsule Take 145 mcg by mouth daily as needed (constipation). 12/10/21   [provider]  lisinopril  (ZESTRIL ) 10 MG tablet Take 1 tablet (10 mg total) by mouth daily. 06/04/22 02/11/23  Arlice Reichert, MD  METHADONE  HCL PO Take 90 mg by mouth daily.    [provider]  Multiple Vitamin (MULTIVITAMIN WITH MINERALS) TABS tablet Take 1 tablet by mouth daily.    [provider]  PARoxetine  (PAXIL ) 40 MG tablet Take 1 tablet (40 mg total) by mouth daily. 06/04/22 02/11/23  Arlice Reichert, MD  Tiotropium Bromide -Olodaterol 2.5-2.5 MCG/ACT AERS Inhale 2 puffs into the lungs daily. 02/11/23   Hope Almarie ORN, NP    Allergies: Patient has no known allergies.    Review of Systems  Respiratory:  Positive for shortness of breath.   All other systems reviewed and are negative.   Updated Vital Signs BP (!) 161/82   Pulse 64   Temp (!) 97.5 F (36.4 C) (Tympanic)   Resp 17   Ht 6' 3 (1.905 m)   Wt 113.4 kg   SpO2 95%   BMI 31.25 kg/m   Physical Exam Vitals and nursing note reviewed.  Constitutional:      General: He is not in acute distress.  Appearance: Normal appearance.  HENT:     Head: Normocephalic and atraumatic.  Eyes:     General:        Right eye: No discharge.        Left eye: No discharge.  Cardiovascular:     Comments: Regular rate and rhythm.  S1/S2 are distinct without any evidence of murmur, rubs, or gallops.  Radial pulses are 2+ bilaterally.  Dorsalis pedis pulses are 2+ bilaterally.  No evidence of pedal edema. Pulmonary:     Comments: Decreased air movement in all lung fields.  Moderate expiratory wheeze heard throughout all lung fields. Abdominal:     General: Abdomen is flat. Bowel sounds are normal. There is no distension.     Tenderness: There is no abdominal tenderness. There is no guarding or rebound.  Musculoskeletal:         General: Normal range of motion.     Cervical back: Neck supple.  Skin:    General: Skin is warm and dry.     Findings: No rash.  Neurological:     General: No focal deficit present.     Mental Status: He is alert.  Psychiatric:        Mood and Affect: Mood normal.        Behavior: Behavior normal.     (all labs ordered are listed, but only abnormal results are displayed) Labs Reviewed  BASIC METABOLIC PANEL WITH GFR - Abnormal; Notable for the following components:      Result Value   Glucose, Bld 101 (*)    All other components within normal limits  CBC    EKG: EKG Interpretation Date/Time:  Wednesday January 21 2024 12:13:28 EDT Ventricular Rate:  73 PR Interval:  142 QRS Duration:  102 QT Interval:  388 QTC Calculation: 427 R Axis:   -56  Text Interpretation: Normal sinus rhythm Left axis deviation Incomplete right bundle branch block Confirmed by Franklyn Gills [phone]) on 01/21/2024 3:14:23 PM  Radiology: DG Chest 2 View Result Date: 01/21/2024 CLINICAL DATA:  Shortness of breath EXAM: DG CHEST 2V COMPARISON:  CT angio chest abdomen pelvis February 04, 2023. FINDINGS: Few patchy opacities overlying the right lower lobe peripheral aspect. The heart size and mediastinal contours are within normal limits. Aortic knob is calcified and prominent. No pleural effusion or pneumothorax. The visualized skeletal structures are unremarkable. IMPRESSION: Few patchy opacities overlying the right lower lobe may represent prominent bronchovascular shadows versus small airspace consolidations. Electronically Signed   By: Megan  Zare M.D.   On: 01/21/2024 13:05    Procedures   Medications Ordered in the ED  methylPREDNISolone  sodium succinate (SOLU-MEDROL ) 125 mg/2 mL injection 125 mg (125 mg Intravenous Given 01/21/24 1442)  ipratropium-albuterol  (DUONEB) 0.5-2.5 (3) MG/3ML nebulizer solution 3 mL (3 mLs Nebulization Given 01/21/24 1445)  azithromycin  (ZITHROMAX ) tablet 500 mg (500  mg Oral Given 01/21/24 1638)    Clinical Course as of 01/21/24 1713  Wed Jan 21, 2024  1705 Patient was able to ambulate on room air in the emergency department above 95%.  Patient's overall feeling better.  I went over labs and imaging with him at the bedside. [CF]  1705 CBC Negative.  [CF]  1705 Basic metabolic panel(!) Negative.  [CF]  1705 DG Chest 2 View Suspicious for pneumonia given the clinical scenario.  Will start him on azithromycin . [CF]    Clinical Course User Index [CF] Theotis Cameron HERO, PA-C    Medical Decision Making CHRISTIAAN STREBECK is a  73 y.o. male patient who presents to the emergency department today for further evaluation of shortness of breath. This patient presents with symptoms most consistent with an acute COPD exacerbation. These constellation of symptoms are similar to prior exacerbations. The likely precipitant is acute respiratory infection. Low suspicion for alternate etiologies such as pneumothorax, acute PE, pneumonia. Presentation not consistent with other acute cardiopulmonary causes including ACS, CHF. Patient given ipratropium, albuterol , solumedrol here with improvement of symptoms. And will be sent home with steroid burst and Doxycycline .  Strict return precautions given. He is safe for discharge.    Amount and/or Complexity of Data Reviewed Labs: ordered. Decision-making details documented in ED Course. Radiology: ordered. Decision-making details documented in ED Course.  Risk Prescription drug management.     Final diagnoses:  COPD exacerbation (HCC)  Pneumonia of right lung due to infectious organism, unspecified part of lung    ED Discharge Orders          Ordered    azithromycin  (ZITHROMAX ) 500 MG tablet  Daily,   Status:  Discontinued        01/21/24 1708    predniSONE  (DELTASONE ) 20 MG tablet  Daily        01/21/24 1708    albuterol  (VENTOLIN  HFA) 108 (90 Base) MCG/ACT inhaler  Every 6 hours PRN        01/21/24 1708     doxycycline  (VIBRAMYCIN ) 100 MG capsule  2 times daily        01/21/24 1709               Theotis Peers [place], [place] 01/21/24 1713    Franklyn Sid SAILOR, MD 01/22/24 1014

## 2024-01-21 NOTE — ED Notes (Signed)
 88% RA sat on arrival. I applied 2l oxygen via nasal cannula

## 2024-01-21 NOTE — Discharge Instructions (Signed)
 As we discussed, I am going to send an albuterol  inhaler, steroids, and antibiotics to your pharmacy.  Please take as prescribed.  I would like for you to follow-up with your primary care doctor for further evaluation.  You may return to the emergency department for any worsening symptoms.

## 2024-01-21 NOTE — ED Notes (Signed)
 Patient ambulated and O2 levels were maintained at/above 95%.

## 2024-01-21 NOTE — ED Triage Notes (Signed)
 Pt here from home with c/o sob that started last night hx of cops , no fevers noted

## 2024-01-21 NOTE — ED Triage Notes (Signed)
 Pt here for [facility] since yesterday. Pt states he usually does not need O2 but needed it today. Hx of COPD. Denies CP.

## 2024-02-28 LAB — LAB REPORT - SCANNED: EGFR: 61

## 2024-03-09 ENCOUNTER — Other Ambulatory Visit: Payer: Self-pay | Admitting: Family Medicine

## 2024-03-09 DIAGNOSIS — Z85528 Personal history of other malignant neoplasm of kidney: Secondary | ICD-10-CM

## 2024-03-15 ENCOUNTER — Ambulatory Visit
Admission: RE | Admit: 2024-03-15 | Discharge: 2024-03-15 | Disposition: A | Discharge status: Home / Self Care | Source: Ambulatory Visit | Attending: Family Medicine | Admitting: Family Medicine

## 2024-03-15 DIAGNOSIS — Z85528 Personal history of other malignant neoplasm of kidney: Secondary | ICD-10-CM

## 2024-03-19 ENCOUNTER — Other Ambulatory Visit: Payer: Self-pay | Admitting: Primary Care

## 2024-03-19 NOTE — Telephone Encounter (Signed)
 Courtesy refill

## 2024-04-05 ENCOUNTER — Encounter: Payer: Self-pay | Admitting: Radiology

## 2024-04-08 ENCOUNTER — Ambulatory Visit: Payer: Self-pay | Admitting: Primary Care

## 2024-04-08 NOTE — Telephone Encounter (Signed)
 FYI Only or Action Required?: FYI only for provider: appointment scheduled on 11/7.  Patient is followed in Pulmonology for COPD, last seen on 02/11/2023 by Hope Almarie ORN, NP.  Called Nurse Triage reporting Shortness of Breath.  Symptoms began a couple of days ago.  Symptoms are: unchanged.  Triage Disposition: See HCP Within 4 Hours (Or PCP Triage)  Patient/caregiver understands and will follow disposition?: Yes with modifications, appointment 11/7     Copied from CRM #[id number]. Topic: Clinical - Red Word Triage >> Apr 08, 2024  4:50 PM Rilla B wrote: [facility] that prompted transfer to Nurse Triage: Shortness of breath x 2days.       Reason for Disposition  [1] Longstanding difficulty breathing (e.g., CHF, COPD, emphysema) AND [2] WORSE than normal    Appointment scheduled for tomorrow  Answer Assessment - Initial Assessment Questions 1. RESPIRATORY STATUS: Describe your breathing? (e.g., wheezing, shortness of breath, unable to speak, severe coughing)      Shortness of breath 2. ONSET: When did this breathing problem begin?      2-3 days  3. PATTERN Does the difficult breathing come and go, or has it been constant since it started?      Constant  4. SEVERITY: How bad is your breathing? (e.g., mild, moderate, severe)      Mild to moderate  5. RECURRENT SYMPTOM: Have you had difficulty breathing before? If Yes, ask: When was the last time? and What happened that time?      Yes 6. CARDIAC HISTORY: Do you have any history of heart disease? (e.g., heart attack, angina, bypass surgery, angioplasty)      Yes 7. LUNG HISTORY: Do you have any history of lung disease?  (e.g., pulmonary embolus, asthma, emphysema)     COPD 8. CAUSE: What do you think is causing the breathing problem?      COPD 9. OTHER SYMPTOMS: Do you have any other symptoms? (e.g., chest pain, cough, dizziness, fever, runny nose)     No 10. O2 SATURATION MONITOR:  Do you use an  oxygen saturation monitor (pulse oximeter) at home? If Yes, ask: What is your reading (oxygen level) today? What is your usual oxygen saturation reading? (e.g., 95%)       No  Protocols used: Breathing Difficulty-A-AH

## 2024-04-09 ENCOUNTER — Telehealth: Payer: Self-pay | Admitting: Acute Care

## 2024-04-09 ENCOUNTER — Ambulatory Visit: Admitting: Acute Care

## 2024-04-09 ENCOUNTER — Other Ambulatory Visit: Payer: Self-pay

## 2024-04-09 ENCOUNTER — Encounter: Payer: Self-pay | Admitting: Acute Care

## 2024-04-09 VITALS — BP 138/70 | HR 78 | Temp 97.6°F | Ht 75.0 in | Wt 258.8 lb

## 2024-04-09 DIAGNOSIS — F17210 Nicotine dependence, cigarettes, uncomplicated: Secondary | ICD-10-CM

## 2024-04-09 DIAGNOSIS — J449 Chronic obstructive pulmonary disease, unspecified: Secondary | ICD-10-CM

## 2024-04-09 DIAGNOSIS — R911 Solitary pulmonary nodule: Secondary | ICD-10-CM

## 2024-04-09 DIAGNOSIS — F17200 Nicotine dependence, unspecified, uncomplicated: Secondary | ICD-10-CM

## 2024-04-09 MED ORDER — DOXYCYCLINE HYCLATE 100 MG PO CAPS: 100.0000 mg | ORAL_CAPSULE | Freq: Two times a day (BID) | ORAL | 0 refills | Status: AC

## 2024-04-09 MED ORDER — TIOTROPIUM BROMIDE-OLODATEROL 2.5-2.5 MCG/ACT IN AERS: 2.0000 | INHALATION_SPRAY | Freq: Every day | RESPIRATORY_TRACT | 11 refills | Status: AC

## 2024-04-09 MED ORDER — PREDNISONE 10 MG PO TABS: ORAL_TABLET | ORAL | 0 refills | Status: AC

## 2024-04-09 MED ORDER — ALBUTEROL SULFATE HFA 108 (90 BASE) MCG/ACT IN AERS
1.0000 | INHALATION_SPRAY | Freq: Four times a day (QID) | RESPIRATORY_TRACT | 6 refills | Status: AC | PRN
Start: 2024-04-09 — End: ?

## 2024-04-09 NOTE — Progress Notes (Signed)
 History of Present Illness Jonathan Wilson is a 73 y.o. male current every day smoker followed  through the screening program . Followed by Dr. Jude and Landry Ferrari NP for COPD, and RLL Mass ( resolved on CT 2012).Additional PMH includes  hepatitis C and EtOH related cirrhosis, renal CA status post partial right nephrectomy, diverticulosis, anxiety/depression.  Chronic opiate use on methadone  120mg  daily.  Current every day smoker 2 PPD since 06/04/1963>> 60.8 years>> 121.7 pack year smoking history    04/09/2024 Discussed the use of AI scribe software for clinical note transcription with the patient, who gave verbal consent to proceed.  History of Present Illness Pt. Presents for an acute visit. He was seen in the ED for a COPD flare 01/21/2024.He was given duo Nebs and IV solumedrol, and improved , was able to ambulate without desaturations and was discharged home. CXR at that time showed a right lower lobe consolidation. He was treated for pneumonia with Doxycycline .He does not use oxygen at home. His saturation today 91% on RA.  He states over the last 3 days he has had significant worsening shortness of breath from his baseline. He has run out of his albuterol , and his maintenance inhaler x 3 days. He has had some wheezing , he has a productive cough that he states is clear to beige. He denies fever or hemoptysis. He has not had LDCT since 12/2022. It was a LR 3, so he should have had a 6 month follow up scan. I will have the lung cancer screening team reach out to him to see if we can get him back into the program.   He is smoking 6 cigarettes a day. He is working on quitting.      Test Results:     Latest Ref Rng & Units 01/21/2024   12:16 PM 02/04/2023    5:51 PM 02/04/2023    5:44 PM  CBC  WBC 4.0 - 10.5 K/uL 6.5   9.5   Hemoglobin 13.0 - 17.0 g/dL 85.4  83.2  84.2   Hematocrit 39.0 - 52.0 % 43.4  49.0  48.0   Platelets 150 - 400 K/uL 178   213        Latest Ref Rng & Units  01/21/2024   12:16 PM 10/24/2023   11:58 AM 02/04/2023    5:51 PM  BMP  Glucose 70 - 99 mg/dL 898   844   BUN 8 - 23 mg/dL 11   36   Creatinine 9.38 - 1.24 mg/dL 8.76   7.99   Sodium 864 - 145 mmol/L 137   139   Potassium 3.5 - 5.1 mmol/L 4.3   4.4   Chloride 98 - 111 mmol/L 100   103   CO2 22 - 32 mmol/L 29     Calcium  8.9 - 10.3 mg/dL 9.2  9.5          This result is from an external source.    BNP    Component Value Date/Time   BNP 63.3 05/31/2022 1936    ProBNP    Component Value Date/Time   PROBNP 2,813.0 (H) 06/21/2013 0908    PFT    Component Value Date/Time   FEV1PRE 2.54 06/18/2022 1529   FVCPRE 4.30 06/18/2022 1529   TLC 8.55 06/18/2022 1529   DLCOUNC 24.13 06/18/2022 1529   PREFEV1FVCRT 59 06/18/2022 1529    CT ABDOMEN PELVIS WO CONTRAST Result Date: 03/16/2024 CLINICAL DATA:  Follow-up renal cell carcinoma. Previous right  nephrectomy. EXAM: CT ABDOMEN AND PELVIS WITHOUT CONTRAST TECHNIQUE: Multidetector CT imaging of the abdomen and pelvis was performed following the standard protocol without IV contrast. RADIATION DOSE REDUCTION: This exam was performed according to the departmental dose-optimization program which includes automated exposure control, adjustment of the mA and/or kV according to patient size and/or use of iterative reconstruction technique. COMPARISON:  02/04/2023 FINDINGS: Lower chest: No acute findings. Hepatobiliary: No mass visualized on this unenhanced exam. Prior cholecystectomy. No evidence of biliary obstruction. Pancreas: No mass or inflammatory process visualized on this unenhanced exam. Spleen:  Within normal limits in size. Adrenals/Urinary tract: Stable appearance of postop changes from partial nephrectomy involving upper pole of right kidney. No renal masses identified on this unenhanced exam. No evidence of urolithiasis or hydronephrosis. Unremarkable unopacified urinary bladder. Stomach/Bowel: No evidence of obstruction, inflammatory  process, or abnormal fluid collections. Normal appendix visualized. Vascular/Lymphatic: No pathologically enlarged lymph nodes identified. Stable 3.3 cm infrarenal abdominal aortic aneurysm. Reproductive:  Normal size prostate. Other:  Stable tiny fat-containing umbilical hernia. Musculoskeletal:  No suspicious bone lesions identified. IMPRESSION: Stable postop changes from partial right nephrectomy. No evidence of recurrent or metastatic carcinoma. Stable 3.3 cm infrarenal abdominal aortic aneurysm. Recommend surveillance ultrasound in 3 years. Reference: Journal of Vascular Surgery 67.1 (2018): 2-77. J Am Coll Radiol [phone]. Electronically Signed   By: Norleen DELENA Kil M.D.   On: 03/16/2024 18:33     Past medical hx Past Medical History:  Diagnosis Date   Anxiety    Anxiety and depression 2000   worse after death of wife 25-Mar-2013   Depression    Diverticulosis of colon 2011   seen on CT scan in 2011.    Gout    Hepatitis C before 2000   Hyperlipidemia    Hypertension    Influenza A 06/2013   acute resp failure, did not require intubation.   Obesity    300 # in 2010, BMI 34 in 04/2013.    Pneumonia    recurrent episodes    Psychosis (HCC) 2000   s/e of treatment for Hepatitis C.     Renal cancer (HCC)    Right lower lobe lung mass 05/2008   resolved on follow up CT 2012.    Vitamin D deficiency      Social History   Tobacco Use   Smoking status: Every Day    Current packs/day: 2.00    Average packs/day: 2.0 packs/day for 60.8 years (121.7 ttl pk-yrs)    Types: Cigarettes    Start date: 06/04/1963   Smokeless tobacco: Never   Tobacco comments:    2 cigs per day 02/11/23   Substance Use Topics   Alcohol use: No    Alcohol/week: 1.0 standard drink of alcohol    Types: 1 Cans of beer per week    Comment: hx of etoh abuse- 12 years ago    Drug use: No    Types: Heroin    Comment: last use 12 years ago     Mr.Achord reports that he has been smoking cigarettes. He started  smoking about 60 years ago. He has a 121.7 pack-year smoking history. He has never used smokeless tobacco. He reports that he does not drink alcohol and does not use drugs.  Tobacco Cessation: Ready to quit: Not Answered Counseling given: Not Answered Tobacco comments: 2 cigs per day 02/11/23  Smoking 6-7 cigarettes daily >> 04/09/2024  Past surgical hx, Family hx, Social hx all reviewed.  Current Outpatient Medications on File  Prior to Visit  Medication Sig   amLODipine  (NORVASC ) 5 MG tablet Take 1 tablet (5 mg total) by mouth daily.   ammonium lactate  (AMLACTIN) 12 % lotion Apply 1 Application topically as needed for dry skin.   aspirin  EC 81 MG tablet Take 81 mg by mouth daily.   atorvastatin  (LIPITOR) 40 MG tablet Take 1 tablet (40 mg total) by mouth daily.   LINZESS  145 MCG CAPS capsule Take 145 mcg by mouth daily as needed (constipation).   lisinopril  (ZESTRIL ) 10 MG tablet Take 1 tablet (10 mg total) by mouth daily.   METHADONE  HCL PO Take 90 mg by mouth daily.   Multiple Vitamin (MULTIVITAMIN WITH MINERALS) TABS tablet Take 1 tablet by mouth daily.   PARoxetine  (PAXIL ) 40 MG tablet Take 1 tablet (40 mg total) by mouth daily.   predniSONE  (DELTASONE ) 20 MG tablet Take 2 tablets (40 mg total) by mouth daily.   albuterol  (VENTOLIN  HFA) 108 (90 Base) MCG/ACT inhaler Inhale 1-2 puffs into the lungs every 6 (six) hours as needed for wheezing or shortness of breath. (Patient not taking: Reported on 04/09/2024)   No current facility-administered medications on file prior to visit.     No Known Allergies  Review Of Systems:  Constitutional:   No  weight loss, night sweats,  Fevers, chills, fatigue, or  lassitude.  HEENT:   No headaches,  Difficulty swallowing,  Tooth/dental problems, or  Sore throat,                No sneezing, itching, ear ache, nasal congestion, post nasal drip,   CV:  No chest pain,  Orthopnea, PND, swelling in lower extremities, anasarca, dizziness, palpitations,  syncope.   GI  No heartburn, indigestion, abdominal pain, nausea, vomiting, diarrhea, change in bowel habits, loss of appetite, bloody stools.   Resp: + shortness of breath with exertion and  at rest.  + excess mucus, + productive cough,  + non-productive cough,  No coughing up of blood.  No change in color of mucus.  + wheezing.  No chest wall deformity  Skin: no rash or lesions.  GU: no dysuria, change in color of urine, no urgency or frequency.  No flank pain, no hematuria   MS:  No joint pain or swelling.  No decreased range of motion.  No back pain.  Psych:  No change in mood or affect. No depression or anxiety.  No memory loss.   Vital Signs BP 138/70   Pulse 78   Temp 97.6 F (36.4 C)   Ht 6' 3 (1.905 m)   Wt 258 lb 12.8 oz (117.4 kg)   SpO2 91% Comment: ra  BMI 32.35 kg/m    Physical Exam:  General- No distress,  A&Ox3, pleasant ENT: No sinus tenderness, TM clear, pale nasal mucosa, no oral exudate,no post nasal drip, no LAN Cardiac: S1, S2, regular rate and rhythm, no murmur Chest: + wheeze/ No rales/ dullness; no accessory muscle use, no nasal flaring, no sternal retractions, diminished per bases bilaterally Abd.: Soft Non-tender, ND, BS +, Body mass index is 32.35 kg/m.  Ext: No clubbing cyanosis, edema, no obvious deformities Neuro:  normal strength, MAE x  4, A&O x 3 Skin: No rashes, warm and dry, No obvious skin lesions  Psych: normal mood and behavior  Physical Exam   Assessment & Plan COPD Flare  Current every day smoker Followed through the LDCT screening program  Plan Resume Stiolto take two puffs daily every morning  -  Use Albuterol  2 puffs every 4-6 hours for breakthrough shortness of breath/wheezing  I have sent in prescriptions for both. I have sent in Doxycycline  100 mg . This is an antibiotic. Please take one in the morning, and one in the evening x 7 days. Take with a full glass of water . Wear sun block if you are out in the sun.  I  have sent in a prednisone  taper. Prednisone  taper; 10 mg tablets: 4 tabs x 2 days, 3 tabs x 2 days, 2 tabs x 2 days 1 tab x 2 days then stop.  Follow up in 2 weeks to ensure you are better.  You will get a call from the lung cancer screening program to resume your screenings. You did not go to your CT Chest appointment 06/2023 that was a follow up.  Please contact office for sooner follow up if symptoms do not improve or worsen or seek emergency care   Pt. Was counseled to quit smoking x 3 minutes today.   I spent 25 minutes dedicated to the care of this patient on the date of this encounter to include pre-visit review of records, face-to-face time with the patient discussing conditions above, post visit ordering of testing, clinical documentation with the electronic health record, making appropriate referrals as documented, and communicating necessary information to the patient's healthcare team.     Lauraine JULIANNA Lites, NP 04/09/2024  11:20 AM

## 2024-04-09 NOTE — Telephone Encounter (Signed)
 Left VM for patient to call for scheduling follow up LDCT [phone]

## 2024-04-09 NOTE — Patient Instructions (Addendum)
 It is good to see you today. I am sorry you are not feeling well. I have renewed your inhalers. Resume Stiolto take two puffs daily every morning  - Use Albuterol  2 puffs every 4-6 hours for breakthrough shortness of breath/wheezing  I have sent in Doxycycline  100 mg . This is an antibiotic. Please take one in the morning, and one in the evening x 7 days. Take with a full glass of water . Wear sun block if you are out in the sun.  I have sent in a prednisone  taper. Prednisone  taper; 10 mg tablets: 4 tabs x 2 days, 3 tabs x 2 days, 2 tabs x 2 days 1 tab x 2 days then stop.  Follow up in 2 weeks to ensure you are better.  You will get a call from the lung cancer screening program to resume your screenings. You did not go to your CT Chest appointment 06/2023 that was a follow up.  Please contact office for sooner follow up if symptoms do not improve or worsen or seek emergency care

## 2024-05-18 ENCOUNTER — Ambulatory Visit: Admitting: Acute Care
